# Patient Record
Sex: Male | Born: 1935 | Race: White | Hispanic: No | Marital: Married | Smoking: Current every day smoker
Health system: Southern US, Community
[De-identification: ages and names within clinical notes are randomized; demographics above are authoritative.]

## PROBLEM LIST (undated history)

## (undated) ENCOUNTER — Emergency Department (HOSPITAL_COMMUNITY): Admission: EM | Payer: Self-pay | Source: Home / Self Care

## (undated) DIAGNOSIS — I499 Cardiac arrhythmia, unspecified: Secondary | ICD-10-CM

## (undated) DIAGNOSIS — IMO0001 Reserved for inherently not codable concepts without codable children: Secondary | ICD-10-CM

## (undated) DIAGNOSIS — I701 Atherosclerosis of renal artery: Secondary | ICD-10-CM

## (undated) DIAGNOSIS — N183 Chronic kidney disease, stage 3 unspecified: Secondary | ICD-10-CM

## (undated) DIAGNOSIS — E7800 Pure hypercholesterolemia, unspecified: Secondary | ICD-10-CM

## (undated) DIAGNOSIS — I714 Abdominal aortic aneurysm, without rupture, unspecified: Secondary | ICD-10-CM

## (undated) DIAGNOSIS — I3139 Other pericardial effusion (noninflammatory): Secondary | ICD-10-CM

## (undated) DIAGNOSIS — R0600 Dyspnea, unspecified: Secondary | ICD-10-CM

## (undated) DIAGNOSIS — R7303 Prediabetes: Secondary | ICD-10-CM

## (undated) DIAGNOSIS — E119 Type 2 diabetes mellitus without complications: Secondary | ICD-10-CM

## (undated) DIAGNOSIS — I10 Essential (primary) hypertension: Secondary | ICD-10-CM

## (undated) DIAGNOSIS — Z5189 Encounter for other specified aftercare: Secondary | ICD-10-CM

## (undated) DIAGNOSIS — M5430 Sciatica, unspecified side: Secondary | ICD-10-CM

## (undated) DIAGNOSIS — I313 Pericardial effusion (noninflammatory): Secondary | ICD-10-CM

## (undated) HISTORY — DX: Pericardial effusion (noninflammatory): I31.3

## (undated) HISTORY — DX: Essential (primary) hypertension: I10

## (undated) HISTORY — DX: Other pericardial effusion (noninflammatory): I31.39

---

## 1979-04-20 HISTORY — PX: NASAL SEPTUM SURGERY: SHX37

## 1989-04-19 HISTORY — PX: CATARACT EXTRACTION W/ INTRAOCULAR LENS  IMPLANT, BILATERAL: SHX1307

## 1998-02-14 ENCOUNTER — Encounter (HOSPITAL_COMMUNITY): Admission: RE | Admit: 1998-02-14 | Discharge: 1998-05-15 | Payer: Self-pay | Admitting: Gastroenterology

## 1998-02-16 ENCOUNTER — Ambulatory Visit (HOSPITAL_COMMUNITY): Admission: RE | Admit: 1998-02-16 | Discharge: 1998-02-16 | Payer: Self-pay | Admitting: Gastroenterology

## 1998-02-23 ENCOUNTER — Ambulatory Visit (HOSPITAL_COMMUNITY): Admission: RE | Admit: 1998-02-23 | Discharge: 1998-02-23 | Payer: Self-pay | Admitting: Gastroenterology

## 2003-08-30 ENCOUNTER — Inpatient Hospital Stay (HOSPITAL_COMMUNITY): Admission: AD | Admit: 2003-08-30 | Discharge: 2003-09-01 | Payer: Self-pay | Admitting: Orthopedic Surgery

## 2004-05-07 ENCOUNTER — Encounter: Payer: Self-pay | Admitting: Internal Medicine

## 2005-02-11 ENCOUNTER — Encounter: Admission: RE | Admit: 2005-02-11 | Discharge: 2005-02-11 | Payer: Self-pay | Admitting: Orthopedic Surgery

## 2005-02-13 ENCOUNTER — Ambulatory Visit (HOSPITAL_COMMUNITY): Admission: RE | Admit: 2005-02-13 | Discharge: 2005-02-13 | Payer: Self-pay | Admitting: Orthopedic Surgery

## 2005-02-13 ENCOUNTER — Ambulatory Visit (HOSPITAL_BASED_OUTPATIENT_CLINIC_OR_DEPARTMENT_OTHER): Admission: RE | Admit: 2005-02-13 | Discharge: 2005-02-13 | Payer: Self-pay | Admitting: Orthopedic Surgery

## 2005-10-04 ENCOUNTER — Emergency Department (HOSPITAL_COMMUNITY): Admission: EM | Admit: 2005-10-04 | Discharge: 2005-10-04 | Payer: Self-pay | Admitting: Emergency Medicine

## 2011-08-20 DIAGNOSIS — T4145XA Adverse effect of unspecified anesthetic, initial encounter: Secondary | ICD-10-CM

## 2011-08-20 DIAGNOSIS — T8859XA Other complications of anesthesia, initial encounter: Secondary | ICD-10-CM

## 2011-08-20 HISTORY — DX: Adverse effect of unspecified anesthetic, initial encounter: T41.45XA

## 2011-08-20 HISTORY — DX: Other complications of anesthesia, initial encounter: T88.59XA

## 2011-08-26 DIAGNOSIS — E782 Mixed hyperlipidemia: Secondary | ICD-10-CM | POA: Diagnosis not present

## 2011-08-26 DIAGNOSIS — E1129 Type 2 diabetes mellitus with other diabetic kidney complication: Secondary | ICD-10-CM | POA: Diagnosis not present

## 2011-08-26 DIAGNOSIS — E119 Type 2 diabetes mellitus without complications: Secondary | ICD-10-CM | POA: Diagnosis not present

## 2011-08-26 DIAGNOSIS — I10 Essential (primary) hypertension: Secondary | ICD-10-CM | POA: Diagnosis not present

## 2011-08-26 DIAGNOSIS — N183 Chronic kidney disease, stage 3 unspecified: Secondary | ICD-10-CM | POA: Diagnosis not present

## 2011-09-17 DIAGNOSIS — I10 Essential (primary) hypertension: Secondary | ICD-10-CM | POA: Diagnosis not present

## 2011-09-17 DIAGNOSIS — R609 Edema, unspecified: Secondary | ICD-10-CM | POA: Diagnosis not present

## 2011-09-23 DIAGNOSIS — N3941 Urge incontinence: Secondary | ICD-10-CM | POA: Diagnosis not present

## 2011-09-23 DIAGNOSIS — N401 Enlarged prostate with lower urinary tract symptoms: Secondary | ICD-10-CM | POA: Diagnosis not present

## 2011-09-30 ENCOUNTER — Other Ambulatory Visit: Payer: Self-pay | Admitting: Nephrology

## 2011-09-30 DIAGNOSIS — E785 Hyperlipidemia, unspecified: Secondary | ICD-10-CM | POA: Diagnosis not present

## 2011-09-30 DIAGNOSIS — I129 Hypertensive chronic kidney disease with stage 1 through stage 4 chronic kidney disease, or unspecified chronic kidney disease: Secondary | ICD-10-CM | POA: Diagnosis not present

## 2011-09-30 DIAGNOSIS — N189 Chronic kidney disease, unspecified: Secondary | ICD-10-CM

## 2011-09-30 DIAGNOSIS — E213 Hyperparathyroidism, unspecified: Secondary | ICD-10-CM | POA: Diagnosis not present

## 2011-09-30 DIAGNOSIS — D649 Anemia, unspecified: Secondary | ICD-10-CM | POA: Diagnosis not present

## 2011-09-30 DIAGNOSIS — N183 Chronic kidney disease, stage 3 unspecified: Secondary | ICD-10-CM | POA: Diagnosis not present

## 2011-10-03 ENCOUNTER — Ambulatory Visit
Admission: RE | Admit: 2011-10-03 | Discharge: 2011-10-03 | Disposition: A | Discharge status: Home / Self Care | Payer: Medicare Other | Source: Ambulatory Visit | Attending: Nephrology | Admitting: Nephrology

## 2011-10-03 DIAGNOSIS — N189 Chronic kidney disease, unspecified: Secondary | ICD-10-CM

## 2011-10-03 DIAGNOSIS — I319 Disease of pericardium, unspecified: Secondary | ICD-10-CM | POA: Diagnosis not present

## 2011-10-08 DIAGNOSIS — I120 Hypertensive chronic kidney disease with stage 5 chronic kidney disease or end stage renal disease: Secondary | ICD-10-CM | POA: Diagnosis not present

## 2011-10-14 DIAGNOSIS — I120 Hypertensive chronic kidney disease with stage 5 chronic kidney disease or end stage renal disease: Secondary | ICD-10-CM | POA: Diagnosis not present

## 2011-11-08 ENCOUNTER — Other Ambulatory Visit: Payer: Self-pay | Admitting: Nephrology

## 2011-11-08 ENCOUNTER — Encounter: Payer: Self-pay | Admitting: Cardiovascular Disease

## 2011-11-08 DIAGNOSIS — N184 Chronic kidney disease, stage 4 (severe): Secondary | ICD-10-CM | POA: Diagnosis not present

## 2011-11-08 DIAGNOSIS — I10 Essential (primary) hypertension: Secondary | ICD-10-CM | POA: Diagnosis not present

## 2011-11-08 DIAGNOSIS — I129 Hypertensive chronic kidney disease with stage 1 through stage 4 chronic kidney disease, or unspecified chronic kidney disease: Secondary | ICD-10-CM | POA: Diagnosis not present

## 2011-11-08 DIAGNOSIS — E785 Hyperlipidemia, unspecified: Secondary | ICD-10-CM | POA: Diagnosis not present

## 2011-11-08 DIAGNOSIS — I701 Atherosclerosis of renal artery: Secondary | ICD-10-CM

## 2011-11-11 DIAGNOSIS — H6120 Impacted cerumen, unspecified ear: Secondary | ICD-10-CM | POA: Diagnosis not present

## 2011-11-12 ENCOUNTER — Ambulatory Visit (HOSPITAL_COMMUNITY)
Admission: RE | Admit: 2011-11-12 | Discharge: 2011-11-12 | Disposition: A | Discharge status: Home / Self Care | Payer: Medicare Other | Source: Ambulatory Visit | Attending: Nephrology | Admitting: Nephrology

## 2011-11-12 DIAGNOSIS — I700 Atherosclerosis of aorta: Secondary | ICD-10-CM | POA: Diagnosis not present

## 2011-11-12 DIAGNOSIS — I714 Abdominal aortic aneurysm, without rupture, unspecified: Secondary | ICD-10-CM | POA: Insufficient documentation

## 2011-11-12 DIAGNOSIS — I701 Atherosclerosis of renal artery: Secondary | ICD-10-CM | POA: Diagnosis not present

## 2011-11-12 DIAGNOSIS — N19 Unspecified kidney failure: Secondary | ICD-10-CM | POA: Insufficient documentation

## 2011-11-12 DIAGNOSIS — I129 Hypertensive chronic kidney disease with stage 1 through stage 4 chronic kidney disease, or unspecified chronic kidney disease: Secondary | ICD-10-CM | POA: Diagnosis not present

## 2011-11-14 ENCOUNTER — Encounter: Payer: Self-pay | Admitting: *Deleted

## 2011-11-14 ENCOUNTER — Ambulatory Visit (INDEPENDENT_AMBULATORY_CARE_PROVIDER_SITE_OTHER): Payer: Medicare Other | Admitting: Cardiovascular Disease

## 2011-11-14 VITALS — BP 147/80 | HR 63 | Ht 69.0 in | Wt 141.1 lb

## 2011-11-14 DIAGNOSIS — I10 Essential (primary) hypertension: Secondary | ICD-10-CM | POA: Diagnosis not present

## 2011-11-14 DIAGNOSIS — I701 Atherosclerosis of renal artery: Secondary | ICD-10-CM | POA: Diagnosis not present

## 2011-11-14 MED ORDER — CLOPIDOGREL BISULFATE 75 MG PO TABS: ORAL_TABLET | ORAL | Status: DC

## 2011-11-14 NOTE — Patient Instructions (Signed)
Your physician has requested that you have a peripheral vascular angiogram. This exam is performed at the hospital. During this exam IV contrast is used to look at arterial blood flow. Please review the information sheet given for details.   

## 2011-11-15 ENCOUNTER — Encounter (HOSPITAL_COMMUNITY): Payer: Self-pay | Admitting: Pharmacy Technician

## 2011-11-23 ENCOUNTER — Encounter: Payer: Self-pay | Admitting: Cardiovascular Disease

## 2011-11-23 NOTE — Progress Notes (Signed)
 HPI:  [name] referred for evaluation of renal artery stenosis. The patient has been recently followed by Dr. Deterding with the [facility]. He has long-standing hypertension and has had type 2 diabetes diagnosed approximately 5 years ago. 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 His most recent creatinine was 1.8 mg/dL. Because of progressive renal dysfunction he underwent an MRA to evaluate for renovascular disease. This suggested bilateral renal artery stenosis with either significant stenosis or a flow gap created by calcification at the origin of the right renal artery. On the left side there was very poor in flow noted into the main renal artery suggestive of a critical flow-limiting stenosis. 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 The patient also underwent renal duplex ultrasonography showing equal kidney size bilaterally at 9.6 cm in length on the right and 9.7 cm in length on the left.  Renal arterial velocities were markedly elevated bilaterally left greater than right with a peak velocity of 577 cm/s on the left and 351 cm/s on the right. These findings are also highly consistent with severe bilateral renal artery stenosis. He is referred for consideration of catheter angiography.  From a symptomatic perspective, the patient is doing fairly well. He walks 30 minutes per day and denies exertional chest pain or pressure. He denies dyspnea, leg claudication symptoms, orthopnea, PND, or edema.  He continues to smoke a pipe. He drinks 2-3 ounces of bourbon per day. 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 He has a very supportive family with his wife and daughters present today. He has a strong family history of cardiovascular disease. His brother had a myocardial infarction and his  daughter has had a DVT.  Outpatient Encounter Prescriptions as of 11/14/2011  Medication Sig Dispense Refill  . amLODipine (NORVASC) 10 MG tablet Take 10 mg by mouth daily.      . carvedilol (COREG) 12.5 MG tablet Take 12.5 mg by mouth 2 (two) times daily with a meal.      . Cholecalciferol (VITAMIN D3) 2000 UNITS TABS Take 2,000 mg by mouth daily.       . ezetimibe (ZETIA) 10 MG tablet Take 10 mg by mouth daily.      . fenofibrate (TRICOR) 145 MG tablet Take 145 mg by mouth daily.      . furosemide (LASIX) 20 MG tablet Take 20 mg by mouth daily. Will stop 4-8 for procedure       . rosuvastatin (CRESTOR) 40 MG tablet Take 40 mg by mouth daily.      . TAMSULOSIN HCL PO Take 0.5 mg by mouth daily.      . clopidogrel (PLAVIX) 75 MG tablet Take 4 tablets the day before your renal angiogram.  4 tablet  0    Review of patient's allergies indicates no known allergies.  Past Medical History  Diagnosis Date  . Kidney disease     stage 3/4juxtar  . HTN (hypertension)   . Diabetes mellitus     Past Surgical History  Procedure Date  . Nose surgery   . Cataract extraction     History   Social History  . Marital Status: Married    Spouse Name: N/A    Number of Children: N/A  . Years of Education: N/A   Occupational History  .   Not on file.   Social History Main Topics  . Smoking status: Current Everyday Smoker    Types: Pipe  . Smokeless tobacco: Not on file  . Alcohol Use: Yes     Daily  . Drug Use: No  . Sexually Active:    Other Topics Concern  . Not on file   Social History Narrative  . No narrative on file    Family History  Problem Relation Age of Onset  . Hypertension Mother   . Hypertension Brother     ROS: General: no fevers/chills/night sweats Eyes: no blurry vision, diplopia, or amaurosis ENT: no sore throat or hearing loss Resp: no cough, wheezing, or hemoptysis CV: no edema or palpitations GI: no abdominal pain, nausea, vomiting, diarrhea, or  constipation GU: no dysuria, frequency, or hematuria Skin: no rash Neuro: no headache, numbness, tingling, or weakness of extremities Musculoskeletal: no joint pain or swelling Heme: no bleeding, DVT, or easy bruising Endo: no polydipsia or polyuria  BP 147/80  Pulse 63  Ht 5' 9" (1.753 m)  Wt 64.012 kg (141 lb 1.9 oz)  BMI 20.84 kg/m2  PHYSICAL EXAM: Pt is alert and oriented, WD, WN, pleasant elderly male in no distress. HEENT: normal Neck: JVP normal. Carotid upstrokes normal without bruits. No thyromegaly. Lungs: equal expansion, clear bilaterally CV: Apex is discrete and nondisplaced, RRR without murmur or gallop Abd: soft, NT, +BS, no abdominal bruit, no hepatosplenomegaly Back: no CVA tenderness Ext: no C/C/E        Femoral pulses 2+= without bruits        DP/PT pulses decreased bilaterally Skin: warm and dry without rash Neuro: CNII-XII intact             Strength intact = bilaterally  EKG:  Sinus rhythm 59 beats per minute, right bundle branch block, otherwise within normal limits.  ASSESSMENT AND PLAN:  

## 2011-11-23 NOTE — Assessment & Plan Note (Addendum)
The patient has a clinical history and confirmatory noninvasive studies suggestive of severe bilateral renal artery stenosis. He is at risk of progressive deterioration of his renal function. I agree that catheter angiography with an eye toward PTA and stenting is indicated. I reviewed the risks, indications, and alternatives of this procedure with the patient and his family in detail. Specific risks include bleeding, renal artery injury or perforation, and renal failure. They understand the concept that while this procedure is indicated in order to preserve renal function, some patients worsen because of contrast nephropathy or atheroemboli. Will take all precautions with use of CO2 angiography, minimization of ionated contrast, and adequate preprocedure hydration. I last the patient to start aspirin and Plavix prior to the procedure, push fluids the day prior to the procedure, all furosemide for 48 hours prior to the procedure, and to come in early the day of the procedure for IV hydration. Questions were answered to his satisfaction. Further plans pending the result of his angiogram.

## 2011-11-27 ENCOUNTER — Encounter (HOSPITAL_COMMUNITY)
Admission: RE | Disposition: A | Discharge status: Home / Self Care | Payer: Self-pay | Source: Ambulatory Visit | Attending: Cardiovascular Disease

## 2011-11-27 ENCOUNTER — Encounter: Payer: Self-pay | Admitting: Internal Medicine

## 2011-11-27 ENCOUNTER — Encounter (HOSPITAL_COMMUNITY): Payer: Self-pay | Admitting: General Practice

## 2011-11-27 ENCOUNTER — Ambulatory Visit (HOSPITAL_COMMUNITY)
Admission: RE | Admit: 2011-11-27 | Discharge: 2011-11-28 | Disposition: A | Discharge status: Home / Self Care | Payer: MEDICARE | Source: Ambulatory Visit | Attending: Cardiovascular Disease | Admitting: Cardiovascular Disease

## 2011-11-27 DIAGNOSIS — E119 Type 2 diabetes mellitus without complications: Secondary | ICD-10-CM | POA: Insufficient documentation

## 2011-11-27 DIAGNOSIS — I701 Atherosclerosis of renal artery: Secondary | ICD-10-CM

## 2011-11-27 DIAGNOSIS — Z720 Tobacco use: Secondary | ICD-10-CM | POA: Diagnosis present

## 2011-11-27 DIAGNOSIS — I10 Essential (primary) hypertension: Secondary | ICD-10-CM | POA: Diagnosis present

## 2011-11-27 DIAGNOSIS — N184 Chronic kidney disease, stage 4 (severe): Secondary | ICD-10-CM | POA: Diagnosis not present

## 2011-11-27 DIAGNOSIS — I714 Abdominal aortic aneurysm, without rupture, unspecified: Secondary | ICD-10-CM | POA: Diagnosis not present

## 2011-11-27 DIAGNOSIS — E876 Hypokalemia: Secondary | ICD-10-CM | POA: Diagnosis not present

## 2011-11-27 DIAGNOSIS — I129 Hypertensive chronic kidney disease with stage 1 through stage 4 chronic kidney disease, or unspecified chronic kidney disease: Secondary | ICD-10-CM | POA: Insufficient documentation

## 2011-11-27 DIAGNOSIS — N189 Chronic kidney disease, unspecified: Secondary | ICD-10-CM | POA: Diagnosis present

## 2011-11-27 HISTORY — DX: Atherosclerosis of renal artery: I70.1

## 2011-11-27 HISTORY — DX: Reserved for inherently not codable concepts without codable children: IMO0001

## 2011-11-27 HISTORY — PX: RENAL ANGIOGRAM: SHX5509

## 2011-11-27 HISTORY — DX: Chronic kidney disease, stage 3 (moderate): N18.3

## 2011-11-27 HISTORY — PX: RENAL ARTERY STENT: SHX2321

## 2011-11-27 HISTORY — DX: Type 2 diabetes mellitus without complications: E11.9

## 2011-11-27 HISTORY — DX: Pure hypercholesterolemia, unspecified: E78.00

## 2011-11-27 HISTORY — DX: Encounter for other specified aftercare: Z51.89

## 2011-11-27 HISTORY — DX: Abdominal aortic aneurysm, without rupture, unspecified: I71.40

## 2011-11-27 HISTORY — DX: Cardiac arrhythmia, unspecified: I49.9

## 2011-11-27 HISTORY — PX: ABDOMINAL ANGIOGRAM: SHX5499

## 2011-11-27 HISTORY — DX: Abdominal aortic aneurysm, without rupture: I71.4

## 2011-11-27 HISTORY — DX: Chronic kidney disease, stage 3 unspecified: N18.30

## 2011-11-27 LAB — BASIC METABOLIC PANEL
BUN: 31 mg/dL — ABNORMAL HIGH (ref 6–23)
Chloride: 104 mEq/L (ref 96–112)
Creatinine, Ser: 1.37 mg/dL — ABNORMAL HIGH (ref 0.50–1.35)
GFR calc Af Amer: 55 mL/min — ABNORMAL LOW (ref >90)
GFR calc non Af Amer: 47 mL/min — ABNORMAL LOW (ref >90)
Glucose, Bld: 111 mg/dL — ABNORMAL HIGH (ref 70–99)
Potassium: 3.5 mEq/L (ref 3.5–5.1)

## 2011-11-27 LAB — PROTIME-INR
INR: 1.42 (ref 0.00–1.49)
Prothrombin Time: 17.6 seconds — ABNORMAL HIGH (ref 11.6–15.2)

## 2011-11-27 LAB — CBC
HCT: 34.9 % — ABNORMAL LOW (ref 39.0–52.0)
MCH: 28 pg (ref 26.0–34.0)
Platelets: 123 10*3/uL — ABNORMAL LOW (ref 150–400)
RBC: 4.11 MIL/uL — ABNORMAL LOW (ref 4.22–5.81)
RDW: 15 % (ref 11.5–15.5)
WBC: 11.1 10*3/uL — ABNORMAL HIGH (ref 4.0–10.5)

## 2011-11-27 LAB — GLUCOSE, CAPILLARY: Glucose-Capillary: 106 mg/dL — ABNORMAL HIGH (ref 70–99)

## 2011-11-27 SURGERY — RENAL ANGIOGRAM
Anesthesia: LOCAL

## 2011-11-27 MED ORDER — ONDANSETRON HCL 4 MG/2ML IJ SOLN: 4.0000 mg | Freq: Four times a day (QID) | INTRAMUSCULAR | Status: DC | PRN

## 2011-11-27 MED ORDER — CLOPIDOGREL BISULFATE 75 MG PO TABS
75.0000 mg | ORAL_TABLET | Freq: Every day | ORAL | Status: DC
  Administered 2011-11-28: 75 mg via ORAL
  Filled 2011-11-27: qty 1

## 2011-11-27 MED ORDER — HEPARIN (PORCINE) IN NACL 2-0.9 UNIT/ML-% IJ SOLN
INTRAMUSCULAR | Status: AC
  Filled 2011-11-27: qty 1000

## 2011-11-27 MED ORDER — FUROSEMIDE 20 MG PO TABS
20.0000 mg | ORAL_TABLET | Freq: Every day | ORAL | Status: DC
  Administered 2011-11-27 – 2011-11-28 (×2): 20 mg via ORAL
  Filled 2011-11-27 (×2): qty 1

## 2011-11-27 MED ORDER — LIDOCAINE HCL (PF) 1 % IJ SOLN
INTRAMUSCULAR | Status: AC
  Filled 2011-11-27: qty 30

## 2011-11-27 MED ORDER — ACETAMINOPHEN 325 MG PO TABS: 650.0000 mg | ORAL_TABLET | ORAL | Status: DC | PRN

## 2011-11-27 MED ORDER — ASPIRIN 81 MG PO CHEW
324.0000 mg | CHEWABLE_TABLET | ORAL | Status: AC
  Administered 2011-11-27: 324 mg via ORAL
  Filled 2011-11-27: qty 4

## 2011-11-27 MED ORDER — CLOPIDOGREL BISULFATE 75 MG PO TABS
ORAL_TABLET | ORAL | Status: AC
  Administered 2011-11-27: 75 mg via ORAL
  Filled 2011-11-27: qty 1

## 2011-11-27 MED ORDER — SODIUM CHLORIDE 0.9 % IJ SOLN: 3.0000 mL | Freq: Two times a day (BID) | INTRAMUSCULAR | Status: DC

## 2011-11-27 MED ORDER — MIDAZOLAM HCL 2 MG/2ML IJ SOLN
INTRAMUSCULAR | Status: AC
  Filled 2011-11-27: qty 2

## 2011-11-27 MED ORDER — DIAZEPAM 5 MG PO TABS
5.0000 mg | ORAL_TABLET | ORAL | Status: AC
  Administered 2011-11-27: 5 mg via ORAL
  Filled 2011-11-27: qty 1

## 2011-11-27 MED ORDER — DIAZEPAM 5 MG PO TABS
5.0000 mg | ORAL_TABLET | Freq: Once | ORAL | Status: AC
  Administered 2011-11-27: 5 mg via ORAL
  Filled 2011-11-27: qty 1

## 2011-11-27 MED ORDER — SODIUM CHLORIDE 0.9 % IV SOLN: 250.0000 mL | INTRAVENOUS | Status: DC | PRN

## 2011-11-27 MED ORDER — FENTANYL CITRATE 0.05 MG/ML IJ SOLN
INTRAMUSCULAR | Status: AC
  Filled 2011-11-27: qty 2

## 2011-11-27 MED ORDER — TAMSULOSIN HCL 0.4 MG PO CAPS
0.4000 mg | ORAL_CAPSULE | Freq: Every day | ORAL | Status: DC
  Filled 2011-11-27 (×2): qty 1

## 2011-11-27 MED ORDER — HEPARIN SODIUM (PORCINE) 1000 UNIT/ML IJ SOLN
INTRAMUSCULAR | Status: AC
  Filled 2011-11-27: qty 1

## 2011-11-27 MED ORDER — ATORVASTATIN CALCIUM 40 MG PO TABS
40.0000 mg | ORAL_TABLET | Freq: Every day | ORAL | Status: DC
  Filled 2011-11-27 (×2): qty 1

## 2011-11-27 MED ORDER — SODIUM CHLORIDE 0.9 % IJ SOLN
3.0000 mL | Freq: Two times a day (BID) | INTRAMUSCULAR | Status: DC
  Administered 2011-11-28: 3 mL via INTRAVENOUS

## 2011-11-27 MED ORDER — SODIUM CHLORIDE 0.9 % IJ SOLN: 3.0000 mL | INTRAMUSCULAR | Status: DC | PRN

## 2011-11-27 MED ORDER — LOPERAMIDE HCL 2 MG PO CAPS
4.0000 mg | ORAL_CAPSULE | Freq: Once | ORAL | Status: AC
  Administered 2011-11-27: 4 mg via ORAL
  Filled 2011-11-27: qty 2

## 2011-11-27 MED ORDER — SODIUM CHLORIDE 0.9 % IV SOLN
1.0000 mL/kg/h | INTRAVENOUS | Status: AC
  Administered 2011-11-27: 1 mL/kg/h via INTRAVENOUS

## 2011-11-27 MED ORDER — CLOPIDOGREL BISULFATE 75 MG PO TABS
75.0000 mg | ORAL_TABLET | ORAL | Status: AC
  Administered 2011-11-27: 75 mg via ORAL

## 2011-11-27 MED ORDER — SODIUM CHLORIDE 0.9 % IV SOLN
INTRAVENOUS | Status: DC
  Administered 2011-11-27: 09:00:00 via INTRAVENOUS

## 2011-11-27 MED ORDER — CARVEDILOL 12.5 MG PO TABS
12.5000 mg | ORAL_TABLET | Freq: Two times a day (BID) | ORAL | Status: DC
  Administered 2011-11-27 – 2011-11-28 (×2): 12.5 mg via ORAL
  Filled 2011-11-27 (×4): qty 1

## 2011-11-27 MED ORDER — ASPIRIN EC 81 MG PO TBEC
81.0000 mg | DELAYED_RELEASE_TABLET | Freq: Every day | ORAL | Status: DC
  Administered 2011-11-28: 81 mg via ORAL
  Filled 2011-11-27: qty 1

## 2011-11-27 MED ORDER — SODIUM CHLORIDE 0.9 % IV SOLN: 250.0000 mL | INTRAVENOUS | Status: DC

## 2011-11-27 MED ORDER — LOPERAMIDE HCL 2 MG PO CAPS: 2.0000 mg | ORAL_CAPSULE | ORAL | Status: DC | PRN

## 2011-11-27 MED ORDER — AMLODIPINE BESYLATE 10 MG PO TABS
10.0000 mg | ORAL_TABLET | Freq: Every day | ORAL | Status: DC
  Administered 2011-11-28: 10 mg via ORAL
  Filled 2011-11-27: qty 1

## 2011-11-27 MED ORDER — FENOFIBRATE 54 MG PO TABS
54.0000 mg | ORAL_TABLET | Freq: Every day | ORAL | Status: DC
  Administered 2011-11-28: 54 mg via ORAL
  Filled 2011-11-27 (×2): qty 1

## 2011-11-27 MED ORDER — EZETIMIBE 10 MG PO TABS
10.0000 mg | ORAL_TABLET | Freq: Every day | ORAL | Status: DC
  Administered 2011-11-28: 10 mg via ORAL
  Filled 2011-11-27 (×2): qty 1

## 2011-11-27 NOTE — Interval H&P Note (Signed)
History and Physical Interval Note:  11/27/2011 12:52 PM  Chris Peterson  has presented today for surgery, with the diagnosis of Renal artery stenosis  The various methods of treatment have been discussed with the patient and family. After consideration of risks, benefits and other options for treatment, the patient has consented to  Procedure(s) (LRB): RENAL ANGIOGRAM (Bilateral) ABDOMINAL ANGIOGRAM (N/A) as a surgical intervention .  The patients' history has been reviewed, patient examined, no change in status, stable for surgery.  I have reviewed the patients' chart and labs.  Questions were answered to the patient's satisfaction.     Tonny Bollman  11/27/2011 12:53 PM

## 2011-11-27 NOTE — CV Procedure (Signed)
   Cardiac Catheterization Procedure Note  Name: Chris Peterson MRN: [id number] DOB: 06/26/1930  Procedure: CO2 abdominal aortogram, bilateral selective renal angiography, left renal PTA and stenting  Indication: Severe bilateral renal artery stenosis with refractory hypertension and progressive renal disease   Procedural details: The right groin was prepped, draped, and anesthetized with 1% lidocaine. Using modified Seldinger technique, a 6 French sheath was introduced into the right femoral artery. A pigtail catheter was advanced into the suprarenal abdominal aorta. CO2 angiography was performed there was very poor visualization of the renal arteries. I was able to see the right renal artery as opacified right kidney, but the left renal artery was not well visualized. I proceeded with selective angiography of the left renal artery. A 6 French renal double curve guide was utilized. A selective CO2 renal angiogram was performed of the left renal artery, but the vessel again was poorly visualized. I used dilute Visipaque to selectively visualize the left renal artery. There is very heavy ostial calcification and severe stenosis. At that point the patient was given 5000 units of unfractionated heparin. A therapeutic ACT was achieved. The vessel was wired with a stabilizer wire. The vessel was predilated with a 5 x 15 mm balloon and the balloon expanded well at 10 atmospheres. The vessel was then stented with a 6 x 12 mm balloon expandable stent which was deployed at 12 atmospheres with excellent stent expansion. There was a marked pressure gradient across the renal origin which was completely abolished after stenting with no residual gradient. There was an excellent angiographic result with 0% residual stenosis and good flow throughout the left kidney. The guide catheter was pulled out of the left renal artery and used to selectively engage the right renal artery. Selective right renal angiography was then  performed. A right femoral angiogram was then done and a Perclose device was used for femoral arterial hemostasis. The patient tolerated the entire procedure well. There were no immediate complications. A total of 30 cc of contrast was used.  Procedural Findings: Diffuse abdominal aortic irregularity with an infrarenal abdominal aortic aneurysm. Next  The right renal artery is heavily calcified it origin. There is 70% proximal right renal artery stenosis.  The left renal artery is severely calcified at its origin. There is 90% proximal stenosis involving the origin of the vessel.   Final Conclusions:   1. Severe bilateral renal artery stenosis 2. Successful PTA and stenting of the left renal artery  Recommendations: The patient will continue with dual antiplatelet therapy for a minimum of 30 days. He will be hydrated for the remainder of the day and we'll plan on discharge tomorrow. He should have a metabolic panel checked next week. Will need to consider whether to proceed with right renal artery stenting in the future and this will depend on his overall clinical picture. The right renal artery clearly has hemodynamically significant stenosis, but the degree of disease is much less than that of the left renal artery.  Chris Peterson 11/27/2011, 2:56 PM

## 2011-11-27 NOTE — H&P (View-Only) (Signed)
HPI:  This is a 78 year old gentleman referred for evaluation of renal artery stenosis. The patient has been recently followed by Dr. Darrick Penna with the [facility]. He has long-standing hypertension and has had type 2 diabetes diagnosed approximately 5 years ago. 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 His most recent creatinine was 1.8 mg/dL. Because of progressive renal dysfunction he underwent an MRA to evaluate for renovascular disease. This suggested bilateral renal artery stenosis with either significant stenosis or a flow gap created by calcification at the origin of the right renal artery. On the left side there was very poor in flow noted into the main renal artery suggestive of a critical flow-limiting stenosis. 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 The patient also underwent renal duplex ultrasonography showing equal kidney size bilaterally at 9.6 cm in length on the right and 9.7 cm in length on the left.  Renal arterial velocities were markedly elevated bilaterally left greater than right with a peak velocity of 577 cm/s on the left and 351 cm/s on the right. These findings are also highly consistent with severe bilateral renal artery stenosis. He is referred for consideration of catheter angiography.  From a symptomatic perspective, the patient is doing fairly well. He walks 30 minutes per day and denies exertional chest pain or pressure. He denies dyspnea, leg claudication symptoms, orthopnea, PND, or edema.  He continues to smoke a pipe. He drinks 2-3 ounces of bourbon per day. 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 He has a very supportive family with his wife and daughters present today. He has a strong family history of cardiovascular disease. His brother had a myocardial infarction and his  daughter has had a DVT.  Outpatient Encounter Prescriptions as of 11/14/2011  Medication Sig Dispense Refill  . amLODipine (NORVASC) 10 MG tablet Take 10 mg by mouth daily.      . carvedilol (COREG) 12.5 MG tablet Take 12.5 mg by mouth 2 (two) times daily with a meal.      . Cholecalciferol (VITAMIN D3) 2000 UNITS TABS Take 2,000 mg by mouth daily.       [name] ezetimibe (ZETIA) 10 MG tablet Take 10 mg by mouth daily.      . fenofibrate (TRICOR) 145 MG tablet Take 145 mg by mouth daily.      . furosemide (LASIX) 20 MG tablet Take 20 mg by mouth daily. Will stop 4-8 for procedure       . rosuvastatin (CRESTOR) 40 MG tablet Take 40 mg by mouth daily.      [name] TAMSULOSIN HCL PO Take 0.5 mg by mouth daily.      . clopidogrel (PLAVIX) 75 MG tablet Take 4 tablets the day before your renal angiogram.  4 tablet  0    Review of patient's allergies indicates no known allergies.  Past Medical History  Diagnosis Date  . Kidney disease     stage 3/4juxtar  . HTN (hypertension)   . Diabetes mellitus     Past Surgical History  Procedure Date  . Nose surgery   . Cataract extraction     History   Social History  . Marital Status: Married    Spouse Name: N/A    Number of Children: N/A  . Years of Education: N/A   Occupational History  .  Not on file.   Social History Main Topics  . Smoking status: Current Everyday Smoker    Types: Pipe  . Smokeless tobacco: Not on file  . Alcohol Use: Yes     Daily  . Drug Use: No  . Sexually Active:    Other Topics Concern  . Not on file   Social History Narrative  . No narrative on file    Family History  Problem Relation Age of Onset  . Hypertension Mother   . Hypertension Brother     ROS: General: no fevers/chills/night sweats Eyes: no blurry vision, diplopia, or amaurosis ENT: no sore throat or hearing loss Resp: no cough, wheezing, or hemoptysis CV: no edema or palpitations GI: no abdominal pain, nausea, vomiting, diarrhea, or  constipation GU: no dysuria, frequency, or hematuria Skin: no rash Neuro: no headache, numbness, tingling, or weakness of extremities Musculoskeletal: no joint pain or swelling Heme: no bleeding, DVT, or easy bruising Endo: no polydipsia or polyuria  BP 147/80  Pulse 63  Ht 5\' 9"  (1.753 m)  Wt 64.012 kg (141 lb 1.9 oz)  BMI 20.84 kg/m2  PHYSICAL EXAM: Pt is alert and oriented, WD, WN, pleasant elderly male in no distress. HEENT: normal Neck: JVP normal. Carotid upstrokes normal without bruits. No thyromegaly. Lungs: equal expansion, clear bilaterally CV: Apex is discrete and nondisplaced, RRR without murmur or gallop Abd: soft, NT, +BS, no abdominal bruit, no hepatosplenomegaly Back: no CVA tenderness Ext: no C/C/E        Femoral pulses 2+= without bruits        DP/PT pulses decreased bilaterally Skin: warm and dry without rash Neuro: CNII-XII intact             Strength intact = bilaterally  EKG:  Sinus rhythm 59 beats per minute, right bundle branch block, otherwise within normal limits.  ASSESSMENT AND PLAN:

## 2011-11-28 ENCOUNTER — Encounter (HOSPITAL_COMMUNITY): Payer: Self-pay | Admitting: Cardiology

## 2011-11-28 DIAGNOSIS — I10 Essential (primary) hypertension: Secondary | ICD-10-CM | POA: Diagnosis present

## 2011-11-28 DIAGNOSIS — N189 Chronic kidney disease, unspecified: Secondary | ICD-10-CM | POA: Diagnosis present

## 2011-11-28 DIAGNOSIS — I701 Atherosclerosis of renal artery: Secondary | ICD-10-CM | POA: Diagnosis not present

## 2011-11-28 DIAGNOSIS — Z720 Tobacco use: Secondary | ICD-10-CM | POA: Diagnosis present

## 2011-11-28 DIAGNOSIS — E119 Type 2 diabetes mellitus without complications: Secondary | ICD-10-CM | POA: Diagnosis present

## 2011-11-28 DIAGNOSIS — I714 Abdominal aortic aneurysm, without rupture: Secondary | ICD-10-CM | POA: Diagnosis present

## 2011-11-28 LAB — BASIC METABOLIC PANEL
BUN: 21 mg/dL (ref 6–23)
Creatinine, Ser: 1.12 mg/dL (ref 0.50–1.35)
GFR calc Af Amer: 70 mL/min — ABNORMAL LOW (ref >90)
GFR calc non Af Amer: 61 mL/min — ABNORMAL LOW (ref >90)
Glucose, Bld: 237 mg/dL — ABNORMAL HIGH (ref 70–99)

## 2011-11-28 MED ORDER — POTASSIUM CHLORIDE CRYS ER 20 MEQ PO TBCR
40.0000 meq | EXTENDED_RELEASE_TABLET | Freq: Once | ORAL | Status: AC
  Administered 2011-11-28: 40 meq via ORAL
  Filled 2011-11-28: qty 2

## 2011-11-28 MED ORDER — CLOPIDOGREL BISULFATE 75 MG PO TABS: 75.0000 mg | ORAL_TABLET | Freq: Every day | ORAL | Status: DC

## 2011-11-28 MED ORDER — POTASSIUM CHLORIDE CRYS ER 10 MEQ PO TBCR: 10.0000 meq | EXTENDED_RELEASE_TABLET | Freq: Every day | ORAL | Status: DC

## 2011-11-28 NOTE — Discharge Summary (Signed)
Agree as above. See progress note this same date.  Chris Peterson 11/28/2011 9:55 PM

## 2011-11-28 NOTE — Discharge Summary (Signed)
Discharge Summary   Patient ID: Chris Peterson MRN: [id number], DOB/AGE: [birth year] 76 y.o.  Primary MD: Dr. Donovan Kail w/ [facility] Primary Cardiologist: Dr. Excell Seltzer  Admit date: 11/27/2011 D/C date:     11/28/2011      Primary Discharge Diagnoses:  1. Renal Artery Stenosis, Bilateral  - Bilateral renal angiography 11/27/11, Right 70% prox stensosis, Left 90% prox stenosis s/p Left Renal PTA/Stenting with considerations for right renal artery stenting in the future  - DAPT w/ ASA & Plavix for a minimum of 30 days  - F/u renal arterial doppler prior to next office visit  2. Hypokalemia  - K+ 3.0 on day of discharge, supplemented and dc'd with daily.  - BMET next week  Secondary Discharge Diagnoses:  1. Hypertension 2. CKD - Stage 3-4 3. Infrarenal AAA - maximal diameter of 3.5 cm  4. Hyperlipidemia 5. Diabetes Mellitus, Type 2 6. Tobacco abuse 7. Hemorrhoids 8. Nose surgery 9. Cataract extraction  Allergies No Known Allergies  Diagnostic Studies/Procedures:   11/27/11 - CO2 abdominal aortogram, bilateral selective renal angiography, left renal PTA and stenting Procedural Findings:  Diffuse abdominal aortic irregularity with an infrarenal abdominal aortic aneurysm.  The right renal artery is heavily calcified it origin. There is 70% proximal right renal artery stenosis.  The left renal artery is severely calcified at its origin. There is 90% proximal stenosis involving the origin of the vessel.  Final Conclusions:  1. Severe bilateral renal artery stenosis  2. Successful PTA and stenting of the left renal artery  Recommendations: The patient will continue with dual antiplatelet therapy for a minimum of 30 days. He will be hydrated for the remainder of the day and we'll plan on discharge tomorrow. He should have a metabolic panel checked next week. Will need to consider whether to proceed with right renal artery stenting in the future and this will depend on his overall  clinical picture. The right renal artery clearly has hemodynamically significant stenosis, but the degree of disease is much less than that of the left renal artery.  History of Present Illness: 78 y.o. male w/ above medical problems who presented to [facility] on 11/27/11 for planned renal angiography due to severe bilateral renal artery stenosis with refractory hypertension and progressive renal disease.  The patient has been recently followed by Dr. Darrick Penna with the [facility]. He has long-standing hypertension and has had type 2 diabetes diagnosed approximately 5 years ago. 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 His most recent creatinine was 1.8 mg/dL. Because of progressive renal dysfunction he underwent an MRA to evaluate for renovascular disease. This suggested bilateral renal artery stenosis with either significant stenosis or a flow gap created by calcification at the origin of the right renal artery. On the left side there was very poor in flow noted into the main renal artery suggestive of a critical flow-limiting stenosis. 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 The patient also underwent renal duplex ultrasonography showing equal kidney size bilaterally at 9.6 cm in length on the right and 9.7 cm in length on the left. Renal arterial velocities were markedly elevated bilaterally left greater than right with a peak velocity of 577 cm/s on the left and 351 cm/s on the right. These findings are also highly consistent with severe bilateral renal artery stenosis. He is  referred for consideration of catheter angiography.   From a symptomatic perspective, the patient is doing fairly well. He walks 30 minutes per day and denies exertional chest pain or pressure. He denies dyspnea, leg claudication symptoms, orthopnea,  PND, or edema. He continues to smoke a pipe. He drinks 2-3 ounces of bourbon per day. 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 He has a very supportive family with his wife and daughters present today. He has a strong family history of cardiovascular disease. His brother had a myocardial infarction and his daughter has had a DVT.   Hospital Course:  He presented to [facility] on 11/27/11 and underwent bilateral renal angiography which revealed 70% proximal right artery stenosis and 90% prox left renal artery stenosis.  Left Renal PTA/Stenting was performed with consideration for right renal artery stenting in the future. He tolerated the procedure well without complications. He was initiated on DAPT w/ ASA and Plavix with recommendations for continuation for at least 30 days. He was observed overnight and hydrated. Crt on day of discharge was 1.12. K+ was 3.0 which was supplemented. He will have BMET next week.  He was seen and evaluated by Dr. Excell Seltzer who felt he was stable for discharge home with plans for follow up as scheduled below.  Discharge Vitals: Blood pressure 160/67, pulse 64, temperature 98 F (36.7 C), temperature source Oral, resp. rate 16, height 5\' 9"  (1.753 m), weight 132 lb 11.5 oz (60.2 kg), SpO2 93.00%.  Labs:  Component Value Date   WBC 11.1* 11/27/2011   HGB 11.5* 11/27/2011   HCT 34.9* 11/27/2011   MCV 84.9 11/27/2011   PLT 123* 11/27/2011    Lab 11/28/11 1017  NA 137  K 3.0*  CL 102  CO2 23  BUN 21  CREATININE 1.12  CALCIUM 8.9  GLUCOSE 237*    Discharge Medications   Medication List  As of 11/28/2011 11:21 AM   TAKE these medications         amLODipine 10 MG tablet   Commonly known as: NORVASC   Take 10 mg by mouth daily.      aspirin EC 81 MG tablet   Take 81 mg by mouth daily.      carvedilol 12.5 MG tablet   Commonly known as: COREG   Take 12.5 mg by mouth 2 (two) times daily with a meal.      clopidogrel 75 MG tablet   Commonly  known as: PLAVIX   Take 1 tablet (75 mg total) by mouth daily.      ezetimibe 10 MG tablet   Commonly known as: ZETIA   Take 10 mg by mouth daily.      fenofibrate 145 MG tablet   Commonly known as: TRICOR   Take 145 mg by mouth daily.      furosemide 20 MG tablet   Commonly known as: LASIX   Take 20 mg by mouth daily. Will stop 4-8 for procedure        potassium chloride 10 MEQ tablet   Commonly known as: K-DUR,KLOR-CON   Take 1 tablet (10 mEq total) by mouth daily.      rosuvastatin 40 MG tablet   Commonly known as: CRESTOR   Take 40 mg by mouth daily.      TAMSULOSIN HCL PO   Take 0.5 mg by mouth daily.      Vitamin D3 2000 UNITS Tabs   Take 2,000 mg by mouth daily.  Disposition   Discharge Orders    Future Appointments: Provider: Department: Dept Phone: Center:   12/04/2011 11:10 AM [facility] [employer] [facility] [phone] [facility]   12/12/2011 8:30 AM [facility] 1 [facility]  None   01/16/2012 10:30 AM Tonny Bollman, MD [facility] [phone] [facility]     Future Orders Please Complete By Expires   Diet - low sodium heart healthy      Increase activity slowly      Discharge instructions      Comments:   **PLEASE REMEMBER TO BRING ALL OF YOUR MEDICATIONS TO EACH OF YOUR FOLLOW-UP OFFICE VISITS.  * KEEP GROIN SITE CLEAN AND DRY. Call the office for any signs of bleedings, pus, swelling, increased pain, or any other concerns. * NO HEAVY LIFTING (>10lbs) OR SEXUAL ACTIVITY X 7 DAYS. * NO DRIVING UNTIL Sunday 11/30/22 * NO SOAKING BATHS, HOT TUBS, POOLS, ETC., X 7 DAYS.       Follow-up Information    Follow up with [facility] on 12/04/2011. (Lab work @ 11:00)    Insurance account manager [address] [place] [address] [phone]      Follow up with [facility] on 12/12/2011. (Renal Arterial Doppler @ 8:30)    Contact information:   [facility] [address] [place] [address] [phone]      Follow up with Tonny Bollman, MD on 01/16/2012. (10:30)    Contact information:   [facility] [address] [place] [address] [phone]          Outstanding Labs/Studies:  1. BMET next week 2. Renal arterial doppler before next office visit  Duration of Discharge Encounter: Greater than 30 minutes including physician and PA time.  Signed, Mirtie Bastyr PA-C 11/28/2011, 11:21 AM

## 2011-11-28 NOTE — Progress Notes (Signed)
    Subjective:  Had an episode of diarrhea last night, otherwise feels fine. No chest pain or swelling.  Objective:  Vital Signs in the last 24 hours: Temp:  [97.5 F (36.4 C)-99.8 F (37.7 C)] 98 F (36.7 C) (04/11 0531) Pulse Rate:  [61-71] 71  (04/11 0531) Resp:  [15-18] 15  (04/11 0531) BP: (117-168)/(56-97) 168/71 mmHg (04/11 0531) SpO2:  [92 %-94 %] 93 % (04/11 0531) Weight:  [60.2 kg (132 lb 11.5 oz)] 60.2 kg (132 lb 11.5 oz) (04/11 0729)  Intake/Output from previous day: 04/10 0701 - 04/11 0700 In: 1785.1 [P.O.:915; I.V.:870.1] Out: 276 [Urine:275; Stool:1]  Physical Exam: Pt is alert and oriented, elderly male in NAD HEENT: normal Neck: JVP - normal, carotids 2+= without bruits Lungs: CTA bilaterally CV: RRR without murmur or gallop Abd: soft, NT, Positive BS, no hepatomegaly Ext: no C/C/E, right groin clear Skin: warm/dry no rash   Lab Results:  Basename 11/27/11 0852  WBC 11.1*  HGB 11.5*  PLT 123*    Basename 11/27/11 0852  NA 139  K 3.5  CL 104  CO2 24  GLUCOSE 111*  BUN 31*  CREATININE 1.37*   No results found for this basename: TROPONINI:2,CK,MB:2 in the last 72 hours  Assessment/Plan:  Renal artery stenosis - s/p left renal stent. ASA 81 mg and plavix 75 mg daily. Plavix x 30 days (needs Rx). Continue same antihypertensive meds. OK to drive Sunday. Discharge today after BMET is back. Check BMET in our office next Wednesday. Follow-up with me in 4 weeks with renal arterial doppler prior to the office visit.  Tonny Bollman, M.D. 11/28/2011, 7:43 AM

## 2011-11-29 ENCOUNTER — Telehealth: Payer: Self-pay | Admitting: Cardiovascular Disease

## 2011-11-29 NOTE — Telephone Encounter (Signed)
Pt's daughter calling stating father had another episode of diarrhea last noc and is c/o pain in neck and shoulder--advised to keep tract of BP, encourage fluids(very weak), watch for any symptoms of thrombus such as pain, SOB, or numbness--try hot and cold compresses to neck and shoulder, may use tylenol for pain--15 minutes alternating--pt's daughter agrees--thinks it's from lying in one place for angiogram--advised to call back or go to nearest ED if pain becomes worse or other symptoms arise--nt

## 2011-11-29 NOTE — Telephone Encounter (Signed)
Pt's dtr calling re renal angiogram on Wednesday, released from hospital yesterday, woke up with pain level 9, stiffness from neck and shoulder, pls call asap

## 2011-12-01 NOTE — Telephone Encounter (Signed)
Agree; thx 

## 2011-12-04 ENCOUNTER — Encounter: Payer: Self-pay | Admitting: Cardiovascular Disease

## 2011-12-04 ENCOUNTER — Ambulatory Visit (INDEPENDENT_AMBULATORY_CARE_PROVIDER_SITE_OTHER): Payer: MEDICARE | Admitting: *Deleted

## 2011-12-04 DIAGNOSIS — I701 Atherosclerosis of renal artery: Secondary | ICD-10-CM

## 2011-12-04 LAB — BASIC METABOLIC PANEL
BUN: 35 mg/dL — ABNORMAL HIGH (ref 6–23)
Calcium: 9.2 mg/dL (ref 8.4–10.5)
Creatinine, Ser: 1.5 mg/dL (ref 0.4–1.5)

## 2011-12-04 NOTE — Telephone Encounter (Signed)
This encounter was created in error - please disregard.

## 2011-12-04 NOTE — Telephone Encounter (Signed)
New Problem:      Patient's daughter called in wanting to know the results of her father's blood work.  Please call back.

## 2011-12-11 ENCOUNTER — Other Ambulatory Visit: Payer: Self-pay | Admitting: Cardiology

## 2011-12-11 DIAGNOSIS — I10 Essential (primary) hypertension: Secondary | ICD-10-CM | POA: Diagnosis not present

## 2011-12-11 DIAGNOSIS — D649 Anemia, unspecified: Secondary | ICD-10-CM | POA: Diagnosis not present

## 2011-12-11 DIAGNOSIS — E785 Hyperlipidemia, unspecified: Secondary | ICD-10-CM | POA: Diagnosis not present

## 2011-12-11 DIAGNOSIS — I701 Atherosclerosis of renal artery: Secondary | ICD-10-CM | POA: Diagnosis not present

## 2011-12-11 DIAGNOSIS — I129 Hypertensive chronic kidney disease with stage 1 through stage 4 chronic kidney disease, or unspecified chronic kidney disease: Secondary | ICD-10-CM | POA: Diagnosis not present

## 2011-12-12 ENCOUNTER — Telehealth: Payer: Self-pay | Admitting: Cardiovascular Disease

## 2011-12-12 ENCOUNTER — Other Ambulatory Visit: Payer: Self-pay | Admitting: Cardiology

## 2011-12-12 ENCOUNTER — Encounter (INDEPENDENT_AMBULATORY_CARE_PROVIDER_SITE_OTHER): Payer: MEDICARE

## 2011-12-12 DIAGNOSIS — I701 Atherosclerosis of renal artery: Secondary | ICD-10-CM

## 2011-12-12 DIAGNOSIS — I714 Abdominal aortic aneurysm, without rupture: Secondary | ICD-10-CM | POA: Diagnosis not present

## 2011-12-12 DIAGNOSIS — I313 Pericardial effusion (noninflammatory): Secondary | ICD-10-CM

## 2011-12-12 NOTE — Telephone Encounter (Signed)
Pt's dtr calling re pt in this am to have an US showed fluid around his heart she wants to know about this since she wasn't able to come

## 2011-12-12 NOTE — Telephone Encounter (Signed)
I spoke with Missy and when she did the pt's renal duplex this morning she noticed an incidental finding of a pericardial effusion.  Missy called and spoke with Dr Excell Seltzer and he ordered an echocardiogram to evaluate pericardial effusion. I spoke with Marylu Lund and made her aware of the reason for echocardiogram.  She plans on coming with the pt for echo.

## 2011-12-14 ENCOUNTER — Other Ambulatory Visit (HOSPITAL_COMMUNITY): Payer: Self-pay | Admitting: Cardiology

## 2011-12-16 ENCOUNTER — Other Ambulatory Visit: Payer: Self-pay

## 2011-12-16 ENCOUNTER — Ambulatory Visit (HOSPITAL_COMMUNITY): Payer: MEDICARE | Attending: Cardiology

## 2011-12-16 DIAGNOSIS — I319 Disease of pericardium, unspecified: Secondary | ICD-10-CM | POA: Diagnosis not present

## 2011-12-16 DIAGNOSIS — F17200 Nicotine dependence, unspecified, uncomplicated: Secondary | ICD-10-CM | POA: Insufficient documentation

## 2011-12-16 DIAGNOSIS — I10 Essential (primary) hypertension: Secondary | ICD-10-CM | POA: Insufficient documentation

## 2011-12-16 DIAGNOSIS — Z8249 Family history of ischemic heart disease and other diseases of the circulatory system: Secondary | ICD-10-CM | POA: Insufficient documentation

## 2011-12-16 DIAGNOSIS — I517 Cardiomegaly: Secondary | ICD-10-CM | POA: Insufficient documentation

## 2011-12-16 DIAGNOSIS — I519 Heart disease, unspecified: Secondary | ICD-10-CM | POA: Insufficient documentation

## 2011-12-16 DIAGNOSIS — N289 Disorder of kidney and ureter, unspecified: Secondary | ICD-10-CM | POA: Insufficient documentation

## 2011-12-16 DIAGNOSIS — F1010 Alcohol abuse, uncomplicated: Secondary | ICD-10-CM | POA: Insufficient documentation

## 2011-12-16 DIAGNOSIS — E119 Type 2 diabetes mellitus without complications: Secondary | ICD-10-CM | POA: Insufficient documentation

## 2011-12-16 DIAGNOSIS — I313 Pericardial effusion (noninflammatory): Secondary | ICD-10-CM

## 2011-12-16 DIAGNOSIS — I059 Rheumatic mitral valve disease, unspecified: Secondary | ICD-10-CM | POA: Insufficient documentation

## 2011-12-17 ENCOUNTER — Telehealth: Payer: Self-pay | Admitting: Cardiovascular Disease

## 2011-12-17 NOTE — Telephone Encounter (Signed)
Pt and daughter aware of Renal and Myoview results by phone.  Pt scheduled to see Ward Givens NP on 12/25/11.  I made the pt aware that he can stop Plavix when he finishes his current supply.

## 2011-12-17 NOTE — Telephone Encounter (Signed)
Pt's dtr calling re getting test results and has med questions

## 2011-12-24 ENCOUNTER — Encounter: Payer: Self-pay | Admitting: *Deleted

## 2011-12-25 ENCOUNTER — Encounter: Payer: Self-pay | Admitting: Nurse Practitioner

## 2011-12-25 ENCOUNTER — Ambulatory Visit
Admission: RE | Admit: 2011-12-25 | Discharge: 2011-12-25 | Disposition: A | Discharge status: Home / Self Care | Payer: MEDICARE | Source: Ambulatory Visit | Attending: Cardiovascular Disease | Admitting: Cardiovascular Disease

## 2011-12-25 ENCOUNTER — Ambulatory Visit (INDEPENDENT_AMBULATORY_CARE_PROVIDER_SITE_OTHER): Payer: MEDICARE | Admitting: Nurse Practitioner

## 2011-12-25 ENCOUNTER — Telehealth: Payer: Self-pay | Admitting: Cardiovascular Disease

## 2011-12-25 VITALS — BP 122/61 | Resp 18 | Ht 69.0 in | Wt 135.8 lb

## 2011-12-25 DIAGNOSIS — I319 Disease of pericardium, unspecified: Secondary | ICD-10-CM

## 2011-12-25 DIAGNOSIS — I701 Atherosclerosis of renal artery: Secondary | ICD-10-CM | POA: Insufficient documentation

## 2011-12-25 DIAGNOSIS — I313 Pericardial effusion (noninflammatory): Secondary | ICD-10-CM | POA: Insufficient documentation

## 2011-12-25 LAB — CBC WITH DIFFERENTIAL/PLATELET
Basophils Absolute: 0 10*3/uL (ref 0.0–0.1)
Basophils Relative: 0.4 % (ref 0.0–3.0)
Eosinophils Absolute: 0.2 10*3/uL (ref 0.0–0.7)
HCT: 37.5 % — ABNORMAL LOW (ref 39.0–52.0)
Hemoglobin: 12.4 g/dL — ABNORMAL LOW (ref 13.0–17.0)
Lymphs Abs: 1.5 10*3/uL (ref 0.7–4.0)
MCHC: 33.1 g/dL (ref 30.0–36.0)
MCV: 84.6 fl (ref 78.0–100.0)
Neutro Abs: 5.7 10*3/uL (ref 1.4–7.7)
RDW: 15.8 % — ABNORMAL HIGH (ref 11.5–14.6)

## 2011-12-25 LAB — BASIC METABOLIC PANEL
BUN: 35 mg/dL — ABNORMAL HIGH (ref 6–23)
CO2: 26 mEq/L (ref 19–32)
Chloride: 104 mEq/L (ref 96–112)
Creatinine, Ser: 1.4 mg/dL (ref 0.4–1.5)
Glucose, Bld: 119 mg/dL — ABNORMAL HIGH (ref 70–99)
Potassium: 3.7 mEq/L (ref 3.5–5.1)

## 2011-12-25 LAB — TSH: TSH: 1.21 u[IU]/mL (ref 0.35–5.50)

## 2011-12-25 NOTE — Telephone Encounter (Signed)
Lauren, In case she calls back with other questions, the w/u @ this point is to r/o possible etiologies of pericardial effusion.  The CXR was done to ensure that he didn't have a big lung nodule/CA.  Thanks,  Thayer Ohm

## 2011-12-25 NOTE — Telephone Encounter (Signed)
New Problem:     Patient's daughter called wanting to know about why her father needed a chest X-ray today and also what the results of it was.  Please call back.

## 2011-12-25 NOTE — Telephone Encounter (Signed)
I spoke with the pt's daugther and made her aware of chest x-ray results.  I made her aware that this was done so that a baseline chest x-ray would be on file.

## 2011-12-25 NOTE — Progress Notes (Signed)
Patient Name: Chris Peterson Date of Encounter: 12/25/2011  Primary Care Provider:  Daisy Floro, MD, MD Primary Cardiologist:  Judie Petit. Excell Seltzer, MD  Patient Profile  78 y/o male with h/o bilat renal artery stenosis who was recently noted to have a moderate pericardial effusion.  Problem List   Past Medical History  Diagnosis Date  . HTN (hypertension)   . High cholesterol   . Dysrhythmia     "fast one time"  . Type II diabetes mellitus   . Hemorrhoids   . Blood transfusion     S/P "lost blood due to hemorrhoids"  . Kidney disease, chronic, stage III (moderate, EGFR 30-59 ml/min)   . Atherosclerotic renal artery stenosis, bilateral     Bilateral renal angiography 11/27/11, Right 70% prox, Left 90% prox s/p LRA PTA/Stenting - 6.0x38mm ;  12/12/2011 Renal Duplex: Patent LRA stent & > 60% RRA ost stenosis  . AAA (abdominal aortic aneurysm)     Infrarenal AAA - maximal diameter of 3.5 cm   . Pericardial effusion     a. incidentally noted on renal u/s 11/2011;  b.  12/16/2011 Echo:  EF 55-60%, Gr1 DD, Mild MR, PASP 32, Mod circumferential peric effusion w/o hemodynamic compromise. Density in pericardial space - consider MRI   Past Surgical History  Procedure Date  . Renal artery stent 11/27/11  . Cataract extraction w/ intraocular lens  implant, bilateral 1990's  . Nasal septum surgery 1980's    Allergies  No Known Allergies  HPI  78 y/o male with the above problem list.  He is s/p L renal artery stenting in early April.  Follow-up renal duplex a few weeks ago, showed patency of the LRA stent with residual >60% ostial RRA stenosis.  Incidentally noted was a pericardial effusion.  Dedicated echo was performed 4/29 and pt was found to have a moderate circumferential pericardial effusion w/o hemodynamic compromise.  He presents today for f/u.  He is quite active @ home and walks on the treadmill for 30 mins/day, 7 days/week.  He has not noted any change in his exercise capacity while  on the treadmill but has noted that he fatigues a little more easily when he's doing yard work.  He denies chest pain, dyspnea, orthopnea, presyncope/syncope, early satiety, or edema.  He's noted that his BP has been improved since LRA stenting.  Home Medications  Prior to Admission medications   Medication Sig Start Date End Date Taking? Authorizing Provider  amLODipine (NORVASC) 10 MG tablet Take 10 mg by mouth daily.   Yes Historical Provider, MD  aspirin EC 81 MG tablet Take 81 mg by mouth daily.   Yes Historical Provider, MD  carvedilol (COREG) 12.5 MG tablet Take 12.5 mg by mouth 2 (two) times daily with a meal.   Yes Historical Provider, MD  Cholecalciferol (VITAMIN D3) 2000 UNITS TABS Take 2,000 mg by mouth daily.    Yes Historical Provider, MD  clopidogrel (PLAVIX) 75 MG tablet Take 1 tablet (75 mg total) by mouth daily. 11/28/11  Yes Jessica A Hope, PA-C  ezetimibe (ZETIA) 10 MG tablet Take 10 mg by mouth daily.   Yes Historical Provider, MD  fenofibrate (TRICOR) 145 MG tablet Take 145 mg by mouth daily.   Yes Historical Provider, MD  furosemide (LASIX) 20 MG tablet Take 20 mg by mouth daily. Will stop 4-8 for procedure    Yes Historical Provider, MD  potassium chloride SA (K-DUR,KLOR-CON) 10 MEQ tablet Take 1 tablet (10 mEq total) by  mouth daily. 11/28/11 11/27/12 Yes Jessica A Hope, PA-C  rosuvastatin (CRESTOR) 40 MG tablet Take 40 mg by mouth daily.   Yes Historical Provider, MD  TAMSULOSIN HCL PO Take 0.5 mg by mouth daily.   Yes Historical Provider, MD   Review of Systems Slight decrease in exercise tolerance with performing yard work.  Otherwise, all other systems reviewed and are otherwise negative except as noted above.  Physical Exam  Blood pressure 122/61, resp. rate 18, height 5\' 9"  (1.753 m), weight 135 lb 12.8 oz (61.598 kg). No pulsus paradoxus. General: Pleasant, NAD Psych: Normal affect. Neuro: Alert and oriented X 3. Moves all extremities spontaneously. HEENT:  Normal  Neck: Supple without bruits or JVD. Lungs:  Resp regular and unlabored, CTA. Heart: RRR no s3, s4, or murmurs.  No rub. Abdomen: Soft, non-tender, non-distended, BS + x 4.  Extremities: No clubbing, cyanosis or edema. DP/PT/Radials 2+ and equal bilaterally.  Accessory Clinical Findings  ECG - sinus brady, 55, rbbb, no acute st/t changes.  Assessment & Plan  1.  Moderate Pericardial Effusion:  He has noted some decrease in his exercise tolerance while doing yard work but does not note similar decrease when walking on treadmill for 30 minutes daily.  He denies and chest pain (pleuritic or otherwise), dyspnea, or orthopnea.  Case discussed with Dr. Excell Seltzer.  In the absence of hemodynamic compromise and/or symptoms, we will not pursue pericardiocentesis @ this time.  We will get baseline serologies to assist [place] in determining the etiology of this effusion, including cbc, bmet, tsh, anti-double stranded dna, complement, and cxr.  We'll f/u echo prior to next visit with Dr. Excell Seltzer on May 30th.  It should be noted that prior echo report noted density in pericardial space and we'll consider cardiac MRI following next echo.  2.  Bilateral RAS:  Doing well s/p L renal artery PTA/stenting.  He has residual ostial R renal artery dzs.  Will f/u bmet today and Dr. Excell Seltzer will discuss possible staged intervention of the R renal artery with nephrology.  3.  HTN:  Stable.  Pt has noted improvement since L RA stenting.  4.  Dispo:  Lab work as noted above.  F/U Dr. Excell Seltzer @ the end of the month as scheduled.   Nicolasa Ducking, NP 12/25/2011, 12:33 PM

## 2011-12-25 NOTE — Patient Instructions (Signed)
Please have blood work done today.  These results will be sent to your provider  A chest x-ray takes a picture of the organs and structures inside the chest, including the heart, lungs, and blood vessels. This test can show several things, including, whether the heart is enlarges; whether fluid is building up in the lungs; and whether pacemaker / defibrillator leads are still in place.  Your physician has requested that you have an echocardiogram. Echocardiography is a painless test that uses sound waves to create images of your heart. It provides your doctor with information about the size and shape of your heart and how well your heart's chambers and valves are working. This procedure takes approximately one hour. There are no restrictions for this procedure.  These tests will be scheduled upon check-out today.  No medication changes made today

## 2011-12-26 ENCOUNTER — Telehealth: Payer: Self-pay | Admitting: Cardiovascular Disease

## 2011-12-26 NOTE — Telephone Encounter (Signed)
New msg [facility] lab wants to talk to you about lab order

## 2011-12-26 NOTE — Telephone Encounter (Signed)
Lab states they didn't receive a frozen sample for [facility] 50, spoke with keith he said there wasn't an order for one and one wasn't drawn. If you need this test pt will need to come in for draw. I will forward to dr/nurse

## 2011-12-27 ENCOUNTER — Telehealth: Payer: Self-pay | Admitting: Cardiovascular Disease

## 2011-12-27 MED ORDER — POTASSIUM CHLORIDE CRYS ER 10 MEQ PO TBCR: 10.0000 meq | EXTENDED_RELEASE_TABLET | Freq: Every day | ORAL | Status: DC

## 2011-12-27 NOTE — Telephone Encounter (Signed)
I spoke with [facility] lab and the [facility] 50 is a total complement.  This order was actually cancelled in our system.  The anti-DNA antibody should be resulted by tomorrow. Pt does not need to come back at this time for labs because the [facility] 50 was a cancelled test.

## 2011-12-27 NOTE — Telephone Encounter (Signed)
New problem:     Patient's daughter called in needing a reifll of her father's potassium chloride SA (K-DUR,KLOR-CON) 10 MEQ tablet.

## 2012-01-16 ENCOUNTER — Ambulatory Visit (INDEPENDENT_AMBULATORY_CARE_PROVIDER_SITE_OTHER): Payer: Commercial Managed Care - PPO | Admitting: Cardiovascular Disease

## 2012-01-16 ENCOUNTER — Other Ambulatory Visit: Payer: Self-pay

## 2012-01-16 ENCOUNTER — Ambulatory Visit (HOSPITAL_COMMUNITY): Payer: Medicare Other | Attending: Cardiovascular Disease

## 2012-01-16 ENCOUNTER — Encounter: Payer: Self-pay | Admitting: Cardiovascular Disease

## 2012-01-16 VITALS — BP 112/68 | HR 55 | Ht 69.0 in | Wt 135.0 lb

## 2012-01-16 DIAGNOSIS — I701 Atherosclerosis of renal artery: Secondary | ICD-10-CM

## 2012-01-16 DIAGNOSIS — I313 Pericardial effusion (noninflammatory): Secondary | ICD-10-CM

## 2012-01-16 DIAGNOSIS — I714 Abdominal aortic aneurysm, without rupture, unspecified: Secondary | ICD-10-CM | POA: Insufficient documentation

## 2012-01-16 DIAGNOSIS — I129 Hypertensive chronic kidney disease with stage 1 through stage 4 chronic kidney disease, or unspecified chronic kidney disease: Secondary | ICD-10-CM | POA: Diagnosis not present

## 2012-01-16 DIAGNOSIS — E785 Hyperlipidemia, unspecified: Secondary | ICD-10-CM | POA: Insufficient documentation

## 2012-01-16 DIAGNOSIS — I319 Disease of pericardium, unspecified: Secondary | ICD-10-CM

## 2012-01-16 DIAGNOSIS — Z87891 Personal history of nicotine dependence: Secondary | ICD-10-CM | POA: Diagnosis not present

## 2012-01-16 DIAGNOSIS — E119 Type 2 diabetes mellitus without complications: Secondary | ICD-10-CM | POA: Diagnosis not present

## 2012-01-16 DIAGNOSIS — N189 Chronic kidney disease, unspecified: Secondary | ICD-10-CM | POA: Insufficient documentation

## 2012-01-16 DIAGNOSIS — I517 Cardiomegaly: Secondary | ICD-10-CM | POA: Diagnosis not present

## 2012-01-16 NOTE — Patient Instructions (Signed)
Your physician has requested that you have an echocardiogram in 3 months. Echocardiography is a painless test that uses sound waves to create images of your heart. It provides your doctor with information about the size and shape of your heart and how well your heart's chambers and valves are working. This procedure takes approximately one hour. There are no restrictions for this procedure.   Your physician recommends that you schedule a follow-up appointment in: 3 months with Dr. Excell Seltzer just after echo.

## 2012-01-23 ENCOUNTER — Encounter: Payer: Self-pay | Admitting: Cardiovascular Disease

## 2012-01-23 NOTE — Progress Notes (Signed)
HPI:  This is a 78 year old gentleman presenting for followup evaluation. The patient has bilateral renal artery stenosis and recently underwent left renal artery stenting for critical stenosis in that vessel. The right renal artery is moderately diseased and is being managed medically. He was incidentally noted to have a moderate pericardial effusion on imaging studies. The patient is physically active without exertional symptoms. He specifically denies edema, orthopnea, PND, or exertional dyspnea. He's had no chest pain or pleuritic symptoms. He denies constitutional symptoms such as fevers, chills, night sweats, or weight loss. He has undergone a followup echocardiogram earlier today that demonstrates a moderate pericardial effusion, unchanged from the previous exam one month ago. Serologies have been drawn and showed no significant abnormalities.  Outpatient Encounter Prescriptions as of 01/16/2012  Medication Sig Dispense Refill  . amLODipine (NORVASC) 10 MG tablet Take 10 mg by mouth daily.      [name] aspirin EC 81 MG tablet Take 81 mg by mouth daily.      . carvedilol (COREG) 12.5 MG tablet Take 12.5 mg by mouth 2 (two) times daily with a meal.      . Cholecalciferol (VITAMIN D3) 2000 UNITS TABS Take 2,000 mg by mouth daily.       [name] ezetimibe (ZETIA) 10 MG tablet Take 10 mg by mouth daily.      . fenofibrate (TRICOR) 145 MG tablet Take 145 mg by mouth daily.      . furosemide (LASIX) 20 MG tablet Take 20 mg by mouth daily. Will stop 4-8 for procedure       . potassium chloride (K-DUR,KLOR-CON) 10 MEQ tablet Take 1 tablet (10 mEq total) by mouth daily.  30 tablet  6  . rosuvastatin (CRESTOR) 40 MG tablet Take 40 mg by mouth daily.      [name] TAMSULOSIN HCL PO Take 0.5 mg by mouth daily.      [name] DISCONTD: clopidogrel (PLAVIX) 75 MG tablet Take 1 tablet (75 mg total) by mouth daily.  30 tablet  3    No Known Allergies  Past Medical History  Diagnosis Date  . HTN (hypertension)   . High  cholesterol   . Dysrhythmia     "fast one time"  . Type II diabetes mellitus   . Hemorrhoids   . Blood transfusion     S/P "lost blood due to hemorrhoids"  . Kidney disease, chronic, stage III (moderate, EGFR 30-59 ml/min)   . Atherosclerotic renal artery stenosis, bilateral     Bilateral renal angiography 11/27/11, Right 70% prox, Left 90% prox s/p LRA PTA/Stenting - 6.0x61mm ;  12/12/2011 Renal Duplex: Patent LRA stent & > 60% RRA ost stenosis  . AAA (abdominal aortic aneurysm)     Infrarenal AAA - maximal diameter of 3.5 cm   . Pericardial effusion     a. incidentally noted on renal u/s 11/2011;  b.  12/16/2011 Echo:  EF 55-60%, Gr1 DD, Mild MR, PASP 32, Mod circumferential peric effusion w/o hemodynamic compromise. Density in pericardial space - consider MRI    ROS: Negative except as per HPI  BP 112/68  Pulse 55  Ht 5\' 9"  (1.753 m)  Wt 61.236 kg (135 lb)  BMI 19.94 kg/m2  PHYSICAL EXAM: Pt is alert and oriented, elderly male in NAD There is no pulsus paradox on exam. HEENT: normal Neck: JVP - normal, carotids 2+= without bruits Lungs: CTA bilaterally CV: RRR without murmur or gallop Abd: soft, NT, Positive BS, no hepatomegaly Ext: no C/C/E, distal  pulses intact and equal Skin: warm/dry no rash  EKG:  Sinus bradycardia 55 beats per minute, right bundle branch block.  ASSESSMENT AND PLAN: 1. Renal artery stenosis. The patient has undergone left renal artery stenting. The right renal artery will be managed medically. His chronic kidney disease appears stable his blood pressure is very well-controlled on his current regimen. He will continue with regular followup with Dr. Darrick Penna.  2. Idiopathic pericardial effusion. There is no evidence of Non-physiology either on exam or by echocardiography. The patient has no systemic symptoms, no significant abnormalities on chest x-ray, and no major lab abnormalities. Will continue with regular followup.  3. Hypertension. Well controlled  on current medical program.  4. Followup: 3 months with an echocardiogram in the office visit.  Tonny Bollman 01/23/2012

## 2012-02-03 DIAGNOSIS — E119 Type 2 diabetes mellitus without complications: Secondary | ICD-10-CM | POA: Diagnosis not present

## 2012-02-19 DIAGNOSIS — I129 Hypertensive chronic kidney disease with stage 1 through stage 4 chronic kidney disease, or unspecified chronic kidney disease: Secondary | ICD-10-CM | POA: Diagnosis not present

## 2012-02-19 DIAGNOSIS — E785 Hyperlipidemia, unspecified: Secondary | ICD-10-CM | POA: Diagnosis not present

## 2012-02-19 DIAGNOSIS — I701 Atherosclerosis of renal artery: Secondary | ICD-10-CM | POA: Diagnosis not present

## 2012-03-11 DIAGNOSIS — E119 Type 2 diabetes mellitus without complications: Secondary | ICD-10-CM | POA: Diagnosis not present

## 2012-04-14 ENCOUNTER — Ambulatory Visit (INDEPENDENT_AMBULATORY_CARE_PROVIDER_SITE_OTHER): Payer: Commercial Managed Care - PPO | Admitting: Cardiovascular Disease

## 2012-04-14 ENCOUNTER — Encounter: Payer: Self-pay | Admitting: Cardiovascular Disease

## 2012-04-14 ENCOUNTER — Ambulatory Visit (HOSPITAL_COMMUNITY): Payer: MEDICARE | Attending: Cardiology

## 2012-04-14 VITALS — BP 127/63 | HR 59 | Ht 69.0 in | Wt 137.0 lb

## 2012-04-14 DIAGNOSIS — I701 Atherosclerosis of renal artery: Secondary | ICD-10-CM

## 2012-04-14 DIAGNOSIS — I10 Essential (primary) hypertension: Secondary | ICD-10-CM

## 2012-04-14 DIAGNOSIS — I319 Disease of pericardium, unspecified: Secondary | ICD-10-CM

## 2012-04-14 DIAGNOSIS — N189 Chronic kidney disease, unspecified: Secondary | ICD-10-CM | POA: Diagnosis not present

## 2012-04-14 DIAGNOSIS — E119 Type 2 diabetes mellitus without complications: Secondary | ICD-10-CM | POA: Insufficient documentation

## 2012-04-14 DIAGNOSIS — I313 Pericardial effusion (noninflammatory): Secondary | ICD-10-CM

## 2012-04-14 DIAGNOSIS — I129 Hypertensive chronic kidney disease with stage 1 through stage 4 chronic kidney disease, or unspecified chronic kidney disease: Secondary | ICD-10-CM | POA: Diagnosis not present

## 2012-04-14 NOTE — Progress Notes (Signed)
Echocardiogram performed.  

## 2012-04-14 NOTE — Patient Instructions (Addendum)
Your physician has requested that you have a renal artery duplex in April 2014. During this test, an ultrasound is used to evaluate blood flow to the kidneys. Allow one hour for this exam. Do not eat after midnight the day before and avoid carbonated beverages. Take your medications as you usually do.  Your physician wants you to follow-up in: April 2014 with Dr Excell Seltzer.  You will receive a reminder letter in the mail two months in advance. If you don't receive a letter, please call our office to schedule the follow-up appointment.  Your physician recommends that you continue on your current medications as directed. Please refer to the Current Medication list given to you today.

## 2012-04-14 NOTE — Assessment & Plan Note (Signed)
The patient appears stable. His blood pressure remains well-controlled. His renal function has been stable based on his reports to me. I would like for him to have a followup renal arterial duplex in April 2014 when he is out to 12 months from his stenting procedure. This will also help follow his right renal artery stenosis which is been managed medically. I agree that medical management for the contralateral renal artery is appropriate in this gentleman with stable renal function and well-controlled blood pressure.

## 2012-04-14 NOTE — Progress Notes (Signed)
   HPI:  78 year old gentleman presenting for followup evaluation. The patient is followed for bilateral renal artery stenosis. He underwent stenting of a critical stenosis in the left renal artery several months ago. His right renal artery stenosis has been managed medically.  The patient has been incidentally noted to have a pericardial effusion without symptoms attributable to this. He underwent serologic evaluation demonstrating no clear cause.  From a symptomatic perspective, the patient is doing very well. He denies chest pain, shortness of breath, swelling, orthopnea, or PND. He's had no lightheadedness or syncope. He is compliant with his medications.  Outpatient Encounter Prescriptions as of 04/14/2012  Medication Sig Dispense Refill  . amLODipine (NORVASC) 10 MG tablet Take 10 mg by mouth daily.      [name] aspirin EC 81 MG tablet Take 81 mg by mouth daily.      . carvedilol (COREG) 12.5 MG tablet Take 12.5 mg by mouth 2 (two) times daily with a meal.      . Cholecalciferol (VITAMIN D3) 2000 UNITS TABS Take 2,000 mg by mouth daily.       [name] ezetimibe (ZETIA) 10 MG tablet Take 10 mg by mouth daily.      . fenofibrate (TRICOR) 145 MG tablet Take 145 mg by mouth daily.      . furosemide (LASIX) 20 MG tablet Take 20 mg by mouth daily. Will stop 4-8 for procedure       . potassium chloride (K-DUR,KLOR-CON) 10 MEQ tablet Take 1 tablet (10 mEq total) by mouth daily.  30 tablet  6  . rosuvastatin (CRESTOR) 40 MG tablet Take 40 mg by mouth daily.      [name] TAMSULOSIN HCL PO Take 0.5 mg by mouth daily.        No Known Allergies  Past Medical History  Diagnosis Date  . HTN (hypertension)   . High cholesterol   . Dysrhythmia     "fast one time"  . Type II diabetes mellitus   . Hemorrhoids   . Blood transfusion     S/P "lost blood due to hemorrhoids"  . Kidney disease, chronic, stage III (moderate, EGFR 30-59 ml/min)   . Atherosclerotic renal artery stenosis, bilateral     Bilateral renal  angiography 11/27/11, Right 70% prox, Left 90% prox s/p LRA PTA/Stenting - 6.0x9mm ;  12/12/2011 Renal Duplex: Patent LRA stent & > 60% RRA ost stenosis  . AAA (abdominal aortic aneurysm)     Infrarenal AAA - maximal diameter of 3.5 cm   . Pericardial effusion     a. incidentally noted on renal u/s 11/2011;  b.  12/16/2011 Echo:  EF 55-60%, Gr1 DD, Mild MR, PASP 32, Mod circumferential peric effusion w/o hemodynamic compromise. Density in pericardial space - consider MRI    ROS: Negative except as per HPI  BP 127/63  Pulse 59  Ht 5\' 9"  (1.753 m)  Wt 137 lb (62.143 kg)  BMI 20.23 kg/m2  PHYSICAL EXAM: Pt is alert and oriented, pleasant elderly gentleman in NAD HEENT: normal Neck: JVP - normal, carotids 2+= without bruits Lungs: CTA bilaterally CV: RRR without murmur or gallop Abd: soft, NT, Positive BS, no hepatomegaly Ext: no C/C/E, distal pulses intact and equal Skin: warm/dry no rash  ASSESSMENT AND PLAN:

## 2012-04-14 NOTE — Assessment & Plan Note (Signed)
The patient remains asymptomatic. I have personally reviewed his echo images done today. His pericardial effusion is small and there is no evidence of hemodynamic compromise. He requires no further evaluation at this time.

## 2012-04-14 NOTE — Assessment & Plan Note (Signed)
Blood pressure is well controlled on his current medical program. For followup, I would like to see him back next April after his renal arterial duplex scan.

## 2012-05-13 ENCOUNTER — Other Ambulatory Visit (HOSPITAL_COMMUNITY): Payer: Self-pay | Admitting: Cardiology

## 2012-06-29 DIAGNOSIS — E213 Hyperparathyroidism, unspecified: Secondary | ICD-10-CM | POA: Diagnosis not present

## 2012-06-29 DIAGNOSIS — I701 Atherosclerosis of renal artery: Secondary | ICD-10-CM | POA: Diagnosis not present

## 2012-06-29 DIAGNOSIS — N2581 Secondary hyperparathyroidism of renal origin: Secondary | ICD-10-CM | POA: Diagnosis not present

## 2012-06-29 DIAGNOSIS — E785 Hyperlipidemia, unspecified: Secondary | ICD-10-CM | POA: Diagnosis not present

## 2012-06-29 DIAGNOSIS — Z23 Encounter for immunization: Secondary | ICD-10-CM | POA: Diagnosis not present

## 2012-06-29 DIAGNOSIS — E119 Type 2 diabetes mellitus without complications: Secondary | ICD-10-CM | POA: Diagnosis not present

## 2012-06-29 DIAGNOSIS — I129 Hypertensive chronic kidney disease with stage 1 through stage 4 chronic kidney disease, or unspecified chronic kidney disease: Secondary | ICD-10-CM | POA: Diagnosis not present

## 2012-06-29 DIAGNOSIS — N183 Chronic kidney disease, stage 3 unspecified: Secondary | ICD-10-CM | POA: Diagnosis not present

## 2012-06-29 DIAGNOSIS — I10 Essential (primary) hypertension: Secondary | ICD-10-CM | POA: Diagnosis not present

## 2012-06-30 DIAGNOSIS — E782 Mixed hyperlipidemia: Secondary | ICD-10-CM | POA: Diagnosis not present

## 2012-06-30 DIAGNOSIS — I10 Essential (primary) hypertension: Secondary | ICD-10-CM | POA: Diagnosis not present

## 2012-06-30 DIAGNOSIS — E1129 Type 2 diabetes mellitus with other diabetic kidney complication: Secondary | ICD-10-CM | POA: Diagnosis not present

## 2012-07-02 DIAGNOSIS — H6120 Impacted cerumen, unspecified ear: Secondary | ICD-10-CM | POA: Diagnosis not present

## 2012-07-07 DIAGNOSIS — E782 Mixed hyperlipidemia: Secondary | ICD-10-CM | POA: Diagnosis not present

## 2012-07-07 DIAGNOSIS — E1129 Type 2 diabetes mellitus with other diabetic kidney complication: Secondary | ICD-10-CM | POA: Diagnosis not present

## 2012-07-17 ENCOUNTER — Other Ambulatory Visit: Payer: Self-pay | Admitting: Cardiovascular Disease

## 2012-11-12 ENCOUNTER — Encounter (INDEPENDENT_AMBULATORY_CARE_PROVIDER_SITE_OTHER): Payer: MEDICARE

## 2012-11-12 DIAGNOSIS — I313 Pericardial effusion (noninflammatory): Secondary | ICD-10-CM

## 2012-11-12 DIAGNOSIS — I700 Atherosclerosis of aorta: Secondary | ICD-10-CM | POA: Diagnosis not present

## 2012-11-12 DIAGNOSIS — I701 Atherosclerosis of renal artery: Secondary | ICD-10-CM

## 2012-11-12 DIAGNOSIS — I10 Essential (primary) hypertension: Secondary | ICD-10-CM

## 2012-11-18 ENCOUNTER — Ambulatory Visit (INDEPENDENT_AMBULATORY_CARE_PROVIDER_SITE_OTHER): Payer: MEDICARE | Admitting: Cardiovascular Disease

## 2012-11-18 VITALS — BP 142/70 | HR 56 | Ht 69.0 in | Wt 140.0 lb

## 2012-11-18 DIAGNOSIS — I313 Pericardial effusion (noninflammatory): Secondary | ICD-10-CM

## 2012-11-18 DIAGNOSIS — I701 Atherosclerosis of renal artery: Secondary | ICD-10-CM | POA: Diagnosis not present

## 2012-11-18 DIAGNOSIS — I319 Disease of pericardium, unspecified: Secondary | ICD-10-CM | POA: Diagnosis not present

## 2012-11-18 NOTE — Patient Instructions (Addendum)
Your physician has requested that you have an echocardiogram in [place]. Echocardiography is a painless test that uses sound waves to create images of your heart. It provides your doctor with information about the size and shape of your heart and how well your heart's chambers and valves are working. This procedure takes approximately one hour. There are no restrictions for this procedure.  Your physician wants you to follow-up in: 6 MONTHS with Dr Excell Seltzer.  You will receive a reminder letter in the mail two months in advance. If you don't receive a letter, please call our office to schedule the follow-up appointment.  Your physician recommends that you continue on your current medications as directed. Please refer to the Current Medication list given to you today.

## 2012-11-19 ENCOUNTER — Encounter: Payer: Self-pay | Admitting: Cardiovascular Disease

## 2012-11-19 NOTE — Progress Notes (Signed)
HPI:  79 year old gentleman presenting for followup evaluation. The patient is followed for bilateral renal artery stenosis. He underwent left renal artery stenting last year. He just underwent followup duplex scanning demonstrating patency of his left renal artery. The right renal artery has severe stenosis and has been managed medically. The patient remained stable with reasonably good control of his blood pressure. Last record of his renal function showed a creatinine of 1.3 with a GFR of 50.  He also was incidentally noted to have a pericardial effusion. He underwent serologic evaluation with demonstrated no clear cause. This was followed with serial echo exams demonstrating no significant change.  From a symptomatic perspective the patient is doing well. He denies edema, chest pain, palpitations, lightheadedness, or syncope.  Outpatient Encounter Prescriptions as of 11/18/2012  Medication Sig Dispense Refill  . amLODipine (NORVASC) 10 MG tablet Take 10 mg by mouth daily.      [name] aspirin EC 81 MG tablet Take 81 mg by mouth daily.      . carvedilol (COREG) 12.5 MG tablet Take 12.5 mg by mouth 2 (two) times daily with a meal.      . Cholecalciferol (VITAMIN D3) 2000 UNITS TABS Take 2,000 mg by mouth daily.       [name] ezetimibe (ZETIA) 10 MG tablet Take 10 mg by mouth daily.      . fenofibrate (TRICOR) 145 MG tablet Take 145 mg by mouth daily.      . furosemide (LASIX) 20 MG tablet Take 20 mg by mouth daily. Will stop 4-8 for procedure       . KLOR-CON 10 10 MEQ tablet TAKE 1 TABLET (10 MEQ TOTAL) BY MOUTH DAILY.  30 tablet  0  . rosuvastatin (CRESTOR) 40 MG tablet Take 40 mg by mouth daily.      [name] TAMSULOSIN HCL PO Take 0.5 mg by mouth daily.      . [DISCONTINUED] KLOR-CON 10 10 MEQ tablet TAKE 1 TABLET (10 MEQ TOTAL) BY MOUTH DAILY.  30 tablet  6   No facility-administered encounter medications on file as of 11/18/2012.    No Known Allergies  Past Medical History  Diagnosis Date  . HTN  (hypertension)   . High cholesterol   . Dysrhythmia     "fast one time"  . Type II diabetes mellitus   . Hemorrhoids   . Blood transfusion     S/P "lost blood due to hemorrhoids"  . Kidney disease, chronic, stage III (moderate, EGFR 30-59 ml/min)   . Atherosclerotic renal artery stenosis, bilateral     Bilateral renal angiography 11/27/11, Right 70% prox, Left 90% prox s/p LRA PTA/Stenting - 6.0x50mm ;  12/12/2011 Renal Duplex: Patent LRA stent & > 60% RRA ost stenosis  . AAA (abdominal aortic aneurysm)     Infrarenal AAA - maximal diameter of 3.5 cm   . Pericardial effusion     a. incidentally noted on renal u/s 11/2011;  b.  12/16/2011 Echo:  EF 55-60%, Gr1 DD, Mild MR, PASP 32, Mod circumferential peric effusion w/o hemodynamic compromise. Density in pericardial space - consider MRI    ROS: Negative except as per HPI  BP 142/70  Pulse 56  Ht 5\' 9"  (1.753 m)  Wt 63.504 kg (140 lb)  BMI 20.67 kg/m2  PHYSICAL EXAM: Pt is alert and oriented, pleasant elderly male in NAD HEENT: normal Neck: JVP - normal, carotids 2+= with bilateral bruits Lungs: CTA bilaterally CV: RRR without murmur or gallop Abd: soft, NT,  Positive BS, no hepatomegaly Ext: no C/C/E, distal pulses intact and equal Skin: warm/dry no rash  ASSESSMENT AND PLAN: 1. Renal artery stenosis. The patient's duplex was reviewed with stable findings noted. He does have severe right renal artery stenosis with normal right kidney size but it is 1.6 cm smaller than the left. Considering the stability of his renal function and control of his blood pressure, will continue to manage conservatively. He is followed by Dr. Darrick Penna. Recommend a repeat renal arterial duplex in 1 year.  2. Pericardial effusion, idiopathic. Will repeat an echo exam as his last study was in August 2013. No clear symptoms attributable to this.  3. Hypertension. Controlled on carvedilol and amlodipine.  4. Hyperlipidemia. He is treated with a combination  of Crestor and zetia.  Tonny Bollman 11/19/2012 1:11 PM

## 2012-11-26 ENCOUNTER — Encounter: Payer: Self-pay | Admitting: Cardiovascular Disease

## 2012-11-26 DIAGNOSIS — I701 Atherosclerosis of renal artery: Secondary | ICD-10-CM | POA: Diagnosis not present

## 2012-12-30 DIAGNOSIS — I10 Essential (primary) hypertension: Secondary | ICD-10-CM | POA: Diagnosis not present

## 2012-12-30 DIAGNOSIS — Z0000 Encounter for general adult medical examination without abnormal findings: Secondary | ICD-10-CM | POA: Diagnosis not present

## 2012-12-30 DIAGNOSIS — E119 Type 2 diabetes mellitus without complications: Secondary | ICD-10-CM | POA: Diagnosis not present

## 2012-12-30 DIAGNOSIS — E1129 Type 2 diabetes mellitus with other diabetic kidney complication: Secondary | ICD-10-CM | POA: Diagnosis not present

## 2012-12-30 DIAGNOSIS — E782 Mixed hyperlipidemia: Secondary | ICD-10-CM | POA: Diagnosis not present

## 2013-02-03 ENCOUNTER — Other Ambulatory Visit: Payer: Self-pay | Admitting: Cardiovascular Disease

## 2013-02-08 ENCOUNTER — Other Ambulatory Visit: Payer: Self-pay | Admitting: Cardiovascular Disease

## 2013-02-24 ENCOUNTER — Ambulatory Visit (HOSPITAL_COMMUNITY): Payer: MEDICARE | Attending: Cardiovascular Disease | Admitting: Radiology

## 2013-02-24 DIAGNOSIS — I319 Disease of pericardium, unspecified: Secondary | ICD-10-CM

## 2013-02-24 DIAGNOSIS — I079 Rheumatic tricuspid valve disease, unspecified: Secondary | ICD-10-CM | POA: Insufficient documentation

## 2013-02-24 DIAGNOSIS — I059 Rheumatic mitral valve disease, unspecified: Secondary | ICD-10-CM | POA: Insufficient documentation

## 2013-02-24 DIAGNOSIS — E119 Type 2 diabetes mellitus without complications: Secondary | ICD-10-CM | POA: Diagnosis not present

## 2013-02-24 DIAGNOSIS — E785 Hyperlipidemia, unspecified: Secondary | ICD-10-CM | POA: Diagnosis not present

## 2013-02-24 DIAGNOSIS — I10 Essential (primary) hypertension: Secondary | ICD-10-CM | POA: Diagnosis not present

## 2013-02-24 DIAGNOSIS — I701 Atherosclerosis of renal artery: Secondary | ICD-10-CM

## 2013-02-24 DIAGNOSIS — I313 Pericardial effusion (noninflammatory): Secondary | ICD-10-CM

## 2013-02-24 NOTE — Progress Notes (Signed)
Echocardiogram performed.  

## 2013-03-02 ENCOUNTER — Encounter: Payer: Self-pay | Admitting: Cardiovascular Disease

## 2013-03-02 NOTE — Telephone Encounter (Signed)
New Problem  Pt is wanting the results of his echo.

## 2013-03-03 NOTE — Telephone Encounter (Signed)
This encounter was created in error - please disregard.

## 2013-03-04 DIAGNOSIS — H6120 Impacted cerumen, unspecified ear: Secondary | ICD-10-CM | POA: Diagnosis not present

## 2013-03-18 DIAGNOSIS — E119 Type 2 diabetes mellitus without complications: Secondary | ICD-10-CM | POA: Diagnosis not present

## 2013-05-06 ENCOUNTER — Ambulatory Visit (INDEPENDENT_AMBULATORY_CARE_PROVIDER_SITE_OTHER): Payer: MEDICARE | Admitting: Cardiovascular Disease

## 2013-05-06 ENCOUNTER — Encounter: Payer: Self-pay | Admitting: Cardiovascular Disease

## 2013-05-06 VITALS — BP 124/67 | HR 67 | Ht 69.0 in | Wt 138.0 lb

## 2013-05-06 DIAGNOSIS — I313 Pericardial effusion (noninflammatory): Secondary | ICD-10-CM

## 2013-05-06 DIAGNOSIS — I701 Atherosclerosis of renal artery: Secondary | ICD-10-CM | POA: Diagnosis not present

## 2013-05-06 DIAGNOSIS — I319 Disease of pericardium, unspecified: Secondary | ICD-10-CM | POA: Diagnosis not present

## 2013-05-06 DIAGNOSIS — I10 Essential (primary) hypertension: Secondary | ICD-10-CM | POA: Diagnosis not present

## 2013-05-06 MED ORDER — POTASSIUM CHLORIDE ER 10 MEQ PO TBCR: EXTENDED_RELEASE_TABLET | ORAL | Status: DC

## 2013-05-06 NOTE — Patient Instructions (Addendum)
Your physician has requested that you have a renal artery duplex in [facility] 2015. During this test, an ultrasound is used to evaluate blood flow to the kidneys. Allow one hour for this exam. Do not eat after midnight the day before and avoid carbonated beverages. Take your medications as you usually do.  Your physician wants you to follow-up in: MARCH 2015 with Dr Excell Seltzer. You will receive a reminder letter in the mail two months in advance. If you don't receive a letter, please call our office to schedule the follow-up appointment.  Your physician has requested that you have an echocardiogram in July 2015. Echocardiography is a painless test that uses sound waves to create images of your heart. It provides your doctor with information about the size and shape of your heart and how well your heart's chambers and valves are working. This procedure takes approximately one hour. There are no restrictions for this procedure.  Your physician recommends that you continue on your current medications as directed. Please refer to the Current Medication list given to you today.

## 2013-05-06 NOTE — Progress Notes (Signed)
HPI:  79 year old gentleman presenting for followup evaluation. He is followed for bilateral renal artery stenosis with history of left renal artery stenting. He also has had an idiopathic pericardial effusion. A serologic evaluation demonstrated no clear cause. His last echo from July 2014 showed normal LV function with a free-flowing moderate pericardial effusion without echo evidence of tamponade.   The patient is doing well without complaints of edema, chest pain, dyspnea, orthopnea, PND, or shortness of breath. He has no complaints today. Has been compliant with his medications.   Outpatient Encounter Prescriptions as of 05/06/2013  Medication Sig Dispense Refill  . amLODipine (NORVASC) 10 MG tablet Take 10 mg by mouth daily.      [name] aspirin EC 81 MG tablet Take 81 mg by mouth daily.      . carvedilol (COREG) 12.5 MG tablet Take 12.5 mg by mouth 2 (two) times daily with a meal.      . Cholecalciferol (VITAMIN D3) 2000 UNITS TABS Take 2,000 mg by mouth daily.       [name] ezetimibe (ZETIA) 10 MG tablet Take 10 mg by mouth daily.      . fenofibrate (TRICOR) 145 MG tablet Take 145 mg by mouth daily.      . furosemide (LASIX) 20 MG tablet Take 20 mg by mouth daily. Will stop 4-8 for procedure       . KLOR-CON 10 10 MEQ tablet TAKE 1 TABLET (10 MEQ TOTAL) BY MOUTH DAILY.  30 tablet  0  . rosuvastatin (CRESTOR) 40 MG tablet Take 40 mg by mouth daily.      [name] TAMSULOSIN HCL PO Take 0.5 mg by mouth daily.      . [DISCONTINUED] KLOR-CON M10 10 MEQ tablet TAKE 1 TABLET (10 MEQ TOTAL) BY MOUTH DAILY.  30 tablet  6  . [DISCONTINUED] KLOR-CON M10 10 MEQ tablet TAKE 1 TABLET (10 MEQ TOTAL) BY MOUTH DAILY.  30 tablet  3   No facility-administered encounter medications on file as of 05/06/2013.    No Known Allergies  Past Medical History  Diagnosis Date  . HTN (hypertension)   . High cholesterol   . Dysrhythmia     "fast one time"  . Type II diabetes mellitus   . Hemorrhoids   . Blood transfusion       S/P "lost blood due to hemorrhoids"  . Kidney disease, chronic, stage III (moderate, EGFR 30-59 ml/min)   . Atherosclerotic renal artery stenosis, bilateral     Bilateral renal angiography 11/27/11, Right 70% prox, Left 90% prox s/p LRA PTA/Stenting - 6.0x67mm ;  12/12/2011 Renal Duplex: Patent LRA stent & > 60% RRA ost stenosis  . AAA (abdominal aortic aneurysm)     Infrarenal AAA - maximal diameter of 3.5 cm   . Pericardial effusion     a. incidentally noted on renal u/s 11/2011;  b.  12/16/2011 Echo:  EF 55-60%, Gr1 DD, Mild MR, PASP 32, Mod circumferential peric effusion w/o hemodynamic compromise. Density in pericardial space - consider MRI    ROS: Negative except as per HPI  BP 124/67  Pulse 67  Ht 5\' 9"  (1.753 m)  Wt 138 lb (62.596 kg)  BMI 20.37 kg/m2  SpO2 97%  PHYSICAL EXAM: Pt is alert and oriented, pleasant elderly male in NAD HEENT: normal Neck: JVP - normal, carotids 2+= without bruits Lungs: CTA bilaterally CV: RRR without murmur or gallop Abd: soft, NT, Positive BS, no hepatomegaly Ext: no C/C/E, distal pulses intact and equal  Skin: warm/dry no rash  EKG:  Normal sinus rhythm with right bundle branch block, heart rate 67 beats per minute.  2-D echocardiogram 02/24/2013: Study Conclusions  - Left ventricle: The cavity size was normal. Wall thickness was normal. Systolic function was vigorous. The estimated ejection fraction was in the range of 65% to 70%. Wall motion was normal; there were no regional wall motion abnormalities. Doppler parameters are consistent with abnormal left ventricular relaxation (grade 1 diastolic dysfunction). - Mitral valve: Calcified annulus. Mild regurgitation. - Pulmonary arteries: PA peak pressure: 31mm Hg (S). - Pericardium, extracardiac: A moderate, free-flowing pericardial effusion was identified. The fluid had no internal echoes.There was no evidence of hemodynamic compromise. Impressions:  - When compared to prior echo  of August 2013, no significant changein size of pericardial effusion. No evidence of tamponade.  Renal artery duplex: March 2014: Patency of the left renal artery stent, greater than 60% right renal artery stenosis.  ASSESSMENT AND PLAN: 1. Renal atherosclerosis: last duplex from March 2014 reviewed demonstrating stent patency and severe right renal artery stenosis. He has been clinically stable. Plan repeat duplex at a one year interval. Continue current medical therapy. Renal function followed by Dr Deterding.  2. Pericardial effusion: asymptomatic and longstanding. No evidence of hemodynamic compromise. Repeat echo next year.  3. HTN - controlled.  Tonny Bollman 05/14/2013 5:32 AM

## 2013-05-18 DIAGNOSIS — E213 Hyperparathyroidism, unspecified: Secondary | ICD-10-CM | POA: Diagnosis not present

## 2013-05-18 DIAGNOSIS — N183 Chronic kidney disease, stage 3 unspecified: Secondary | ICD-10-CM | POA: Diagnosis not present

## 2013-05-18 DIAGNOSIS — I129 Hypertensive chronic kidney disease with stage 1 through stage 4 chronic kidney disease, or unspecified chronic kidney disease: Secondary | ICD-10-CM | POA: Diagnosis not present

## 2013-05-18 DIAGNOSIS — E119 Type 2 diabetes mellitus without complications: Secondary | ICD-10-CM | POA: Diagnosis not present

## 2013-05-18 DIAGNOSIS — D649 Anemia, unspecified: Secondary | ICD-10-CM | POA: Diagnosis not present

## 2013-05-18 DIAGNOSIS — I739 Peripheral vascular disease, unspecified: Secondary | ICD-10-CM | POA: Diagnosis not present

## 2013-06-08 DIAGNOSIS — Z23 Encounter for immunization: Secondary | ICD-10-CM | POA: Diagnosis not present

## 2013-06-24 ENCOUNTER — Other Ambulatory Visit: Payer: Self-pay

## 2013-07-06 ENCOUNTER — Encounter (HOSPITAL_COMMUNITY): Payer: Self-pay | Admitting: Cardiovascular Disease

## 2013-09-26 ENCOUNTER — Encounter (HOSPITAL_COMMUNITY): Payer: Self-pay | Admitting: Cardiovascular Disease

## 2013-09-27 ENCOUNTER — Other Ambulatory Visit: Payer: Self-pay

## 2013-09-27 DIAGNOSIS — I701 Atherosclerosis of renal artery: Secondary | ICD-10-CM

## 2013-09-27 DIAGNOSIS — I313 Pericardial effusion (noninflammatory): Secondary | ICD-10-CM

## 2013-09-27 DIAGNOSIS — I3139 Other pericardial effusion (noninflammatory): Secondary | ICD-10-CM

## 2013-09-27 DIAGNOSIS — I10 Essential (primary) hypertension: Secondary | ICD-10-CM

## 2013-09-27 MED ORDER — POTASSIUM CHLORIDE ER 10 MEQ PO TBCR: EXTENDED_RELEASE_TABLET | ORAL | Status: DC

## 2013-10-02 ENCOUNTER — Encounter (HOSPITAL_COMMUNITY): Payer: Self-pay | Admitting: Cardiovascular Disease

## 2013-10-26 DIAGNOSIS — N183 Chronic kidney disease, stage 3 unspecified: Secondary | ICD-10-CM | POA: Diagnosis not present

## 2013-10-26 DIAGNOSIS — I129 Hypertensive chronic kidney disease with stage 1 through stage 4 chronic kidney disease, or unspecified chronic kidney disease: Secondary | ICD-10-CM | POA: Diagnosis not present

## 2013-10-26 DIAGNOSIS — N2581 Secondary hyperparathyroidism of renal origin: Secondary | ICD-10-CM | POA: Diagnosis not present

## 2013-10-26 DIAGNOSIS — D631 Anemia in chronic kidney disease: Secondary | ICD-10-CM | POA: Diagnosis not present

## 2013-10-26 DIAGNOSIS — N039 Chronic nephritic syndrome with unspecified morphologic changes: Secondary | ICD-10-CM | POA: Diagnosis not present

## 2013-10-28 ENCOUNTER — Encounter (HOSPITAL_COMMUNITY): Payer: MEDICARE

## 2013-10-28 ENCOUNTER — Ambulatory Visit: Payer: MEDICARE | Admitting: Cardiovascular Disease

## 2013-11-04 ENCOUNTER — Other Ambulatory Visit: Payer: Self-pay | Admitting: Nurse Practitioner

## 2013-11-04 DIAGNOSIS — E119 Type 2 diabetes mellitus without complications: Secondary | ICD-10-CM | POA: Diagnosis not present

## 2013-11-04 DIAGNOSIS — H02139 Senile ectropion of unspecified eye, unspecified eyelid: Secondary | ICD-10-CM | POA: Diagnosis not present

## 2013-11-04 DIAGNOSIS — I319 Disease of pericardium, unspecified: Secondary | ICD-10-CM

## 2013-11-09 ENCOUNTER — Encounter (HOSPITAL_COMMUNITY): Payer: Self-pay | Admitting: Cardiovascular Disease

## 2013-11-11 ENCOUNTER — Encounter (HOSPITAL_COMMUNITY): Payer: MEDICARE

## 2013-11-16 ENCOUNTER — Ambulatory Visit: Payer: MEDICARE | Admitting: Cardiovascular Disease

## 2013-11-16 ENCOUNTER — Encounter (HOSPITAL_COMMUNITY): Payer: MEDICARE

## 2013-12-01 ENCOUNTER — Ambulatory Visit (HOSPITAL_COMMUNITY): Payer: MEDICARE | Attending: Cardiovascular Disease | Admitting: *Deleted

## 2013-12-01 DIAGNOSIS — N183 Chronic kidney disease, stage 3 unspecified: Secondary | ICD-10-CM | POA: Insufficient documentation

## 2013-12-01 DIAGNOSIS — I10 Essential (primary) hypertension: Secondary | ICD-10-CM

## 2013-12-01 DIAGNOSIS — I714 Abdominal aortic aneurysm, without rupture, unspecified: Secondary | ICD-10-CM | POA: Diagnosis not present

## 2013-12-01 DIAGNOSIS — I129 Hypertensive chronic kidney disease with stage 1 through stage 4 chronic kidney disease, or unspecified chronic kidney disease: Secondary | ICD-10-CM | POA: Diagnosis not present

## 2013-12-01 DIAGNOSIS — E119 Type 2 diabetes mellitus without complications: Secondary | ICD-10-CM | POA: Diagnosis not present

## 2013-12-01 DIAGNOSIS — I701 Atherosclerosis of renal artery: Secondary | ICD-10-CM | POA: Diagnosis not present

## 2013-12-01 DIAGNOSIS — F17200 Nicotine dependence, unspecified, uncomplicated: Secondary | ICD-10-CM | POA: Insufficient documentation

## 2013-12-01 NOTE — Progress Notes (Signed)
Renal duplex complete.  

## 2013-12-08 ENCOUNTER — Ambulatory Visit (INDEPENDENT_AMBULATORY_CARE_PROVIDER_SITE_OTHER): Payer: MEDICARE | Admitting: Cardiovascular Disease

## 2013-12-08 ENCOUNTER — Encounter (HOSPITAL_COMMUNITY): Payer: MEDICARE

## 2013-12-08 ENCOUNTER — Encounter: Payer: Self-pay | Admitting: Cardiovascular Disease

## 2013-12-08 VITALS — BP 134/66 | HR 62 | Ht 69.0 in | Wt 141.8 lb

## 2013-12-08 DIAGNOSIS — N189 Chronic kidney disease, unspecified: Secondary | ICD-10-CM

## 2013-12-08 DIAGNOSIS — I10 Essential (primary) hypertension: Secondary | ICD-10-CM

## 2013-12-08 DIAGNOSIS — I313 Pericardial effusion (noninflammatory): Secondary | ICD-10-CM

## 2013-12-08 DIAGNOSIS — I701 Atherosclerosis of renal artery: Secondary | ICD-10-CM

## 2013-12-08 DIAGNOSIS — I319 Disease of pericardium, unspecified: Secondary | ICD-10-CM

## 2013-12-08 DIAGNOSIS — I3139 Other pericardial effusion (noninflammatory): Secondary | ICD-10-CM

## 2013-12-08 NOTE — Progress Notes (Signed)
HPI:  80 year old gentleman presenting for followup evaluation. He is followed for bilateral renal artery stenosis with history of left renal artery stenting. He also has had an idiopathic pericardial effusion. A serologic evaluation demonstrated no clear cause. His last echo from July 2014 showed normal LV function with a free-flowing moderate pericardial effusion without echo evidence of tamponade.   The patient is doing well at present. He denies chest pain, chest pressure, palpitations, shortness of breath, lightheadedness, or syncope. He has occasional swelling of his right leg but this is greatly improved from previous. He's been compliant with his medications.   Outpatient Encounter Prescriptions as of 12/08/2013  Medication Sig  . amLODipine (NORVASC) 10 MG tablet Take 10 mg by mouth daily.  [name] aspirin EC 81 MG tablet Take 81 mg by mouth daily.  . carvedilol (COREG) 12.5 MG tablet Take 12.5 mg by mouth 2 (two) times daily with a meal.  . Cholecalciferol (VITAMIN D3) 2000 UNITS TABS Take 2,000 mg by mouth daily.   [name] ezetimibe (ZETIA) 10 MG tablet Take 10 mg by mouth daily.  . fenofibrate (TRICOR) 145 MG tablet Take 145 mg by mouth daily.  . furosemide (LASIX) 20 MG tablet Take 20 mg by mouth daily.   . rosuvastatin (CRESTOR) 40 MG tablet Take 40 mg by mouth daily.  [name] TAMSULOSIN HCL PO Take 0.5 mg by mouth daily.  . [DISCONTINUED] potassium chloride (KLOR-CON 10) 10 MEQ tablet TAKE 1 TABLET (10 MEQ TOTAL) BY MOUTH DAILY.    No Known Allergies  Past Medical History  Diagnosis Date  . HTN (hypertension)   . High cholesterol   . Dysrhythmia     "fast one time"  . Type II diabetes mellitus   . Hemorrhoids   . Blood transfusion     S/P "lost blood due to hemorrhoids"  . Kidney disease, chronic, stage III (moderate, EGFR 30-59 ml/min)   . Atherosclerotic renal artery stenosis, bilateral     Bilateral renal angiography 11/27/11, Right 70% prox, Left 90% prox s/p LRA PTA/Stenting -  6.0x22mm ;  12/12/2011 Renal Duplex: Patent LRA stent & > 60% RRA ost stenosis  . AAA (abdominal aortic aneurysm)     Infrarenal AAA - maximal diameter of 3.5 cm   . Pericardial effusion     a. incidentally noted on renal u/s 11/2011;  b.  12/16/2011 Echo:  EF 55-60%, Gr1 DD, Mild MR, PASP 32, Mod circumferential peric effusion w/o hemodynamic compromise. Density in pericardial space - consider MRI    ROS: Negative except as per HPI  BP 134/66  Pulse 62  Ht $R'5\' 9"'Mp$  (1.753 m)  Wt 141 lb 12.8 oz (64.32 kg)  BMI 20.93 kg/m2  PHYSICAL EXAM: Pt is alert and oriented, NAD HEENT: normal Neck: JVP - normal, carotids 2+= without bruits Lungs: CTA bilaterally CV: RRR without murmur or gallop Abd: soft, NT, Positive BS, no hepatomegaly Ext: no C/C/E, distal pulses intact and equal Skin: warm/dry no rash  EKG:  Normal sinus rhythm 62 beats per minute, right bundle branch block.  2-D echocardiogram 02/24/2013: Study Conclusions  - Left ventricle: The cavity size was normal. Wall thickness was normal. Systolic function was vigorous. The estimated ejection fraction was in the range of 65% to 70%. Wall motion was normal; there were no regional wall motion abnormalities. Doppler parameters are consistent with abnormal left ventricular relaxation (grade 1 diastolic dysfunction). - Mitral valve: Calcified annulus. Mild regurgitation. - Pulmonary arteries: PA peak pressure: 44mm Hg (S). -  Pericardium, extracardiac: A moderate, free-flowing pericardial effusion was identified. The fluid had no internal echoes.There was no evidence of hemodynamic compromise. Impressions:  - When compared to prior echo of August 2013, no significant changein size of pericardial effusion. No evidence of tamponade.  ASSESSMENT AND PLAN: 1. Renal artery stenosis. The patient is status post left renal artery stenting approximately 2 years ago. He had a renal duplex evaluation 12/01/2013. This demonstrated wide  patency of his stent site. There is residual severe right renal artery stenosis at the ostium with a peak velocity of 426 cm/s. The right kidney length is 9.7 cm and the left kidney length is 11.2 cm. He remains stable with good blood pressure control. He has recently been seen by Dr. Jimmy Footman and tells me that his renal indices are stable. Will continue with yearly duplex ultrasonography and clinical followup.  2. Idiopathic pericardial effusion, chronic. No signs of hemodynamic sequelae. Recommend followup echocardiogram at yearly intervals (next in July 2015).  3. Hypertension. Blood pressure well controlled.  For followup I will see the patient back in one year.  Sherren Mocha 12/08/2013 2:33 PM

## 2013-12-08 NOTE — Patient Instructions (Addendum)
Your physician has recommended you make the following change in your medication: STOP Potassium supplement, continue to eat foods that are rich in potassium  Your physician recommends that you return for lab work in: [place] (BMP)--the pt will have this drawn with his PCP  Your physician wants you to follow-up in: 1 YEAR with Dr Burt Knack.  You will receive a reminder letter in the mail two months in advance. If you don't receive a letter, please call our office to schedule the follow-up appointment.  Your physician has requested that you have a renal artery duplex in 1 YEAR. During this test, an ultrasound is used to evaluate blood flow to the kidneys. Allow one hour for this exam. Do not eat after midnight the day before and avoid carbonated beverages. Take your medications as you usually do.

## 2014-01-13 DIAGNOSIS — E1129 Type 2 diabetes mellitus with other diabetic kidney complication: Secondary | ICD-10-CM | POA: Diagnosis not present

## 2014-01-13 DIAGNOSIS — N400 Enlarged prostate without lower urinary tract symptoms: Secondary | ICD-10-CM | POA: Diagnosis not present

## 2014-01-13 DIAGNOSIS — I10 Essential (primary) hypertension: Secondary | ICD-10-CM | POA: Diagnosis not present

## 2014-01-13 DIAGNOSIS — Z23 Encounter for immunization: Secondary | ICD-10-CM | POA: Diagnosis not present

## 2014-01-13 DIAGNOSIS — Z0000 Encounter for general adult medical examination without abnormal findings: Secondary | ICD-10-CM | POA: Diagnosis not present

## 2014-01-13 DIAGNOSIS — E782 Mixed hyperlipidemia: Secondary | ICD-10-CM | POA: Diagnosis not present

## 2014-02-14 DIAGNOSIS — H6120 Impacted cerumen, unspecified ear: Secondary | ICD-10-CM | POA: Diagnosis not present

## 2014-03-04 ENCOUNTER — Ambulatory Visit (HOSPITAL_COMMUNITY): Payer: MEDICARE | Attending: Internal Medicine | Admitting: Radiology

## 2014-03-04 DIAGNOSIS — I10 Essential (primary) hypertension: Secondary | ICD-10-CM | POA: Diagnosis not present

## 2014-03-04 DIAGNOSIS — I517 Cardiomegaly: Secondary | ICD-10-CM | POA: Diagnosis not present

## 2014-03-04 DIAGNOSIS — I319 Disease of pericardium, unspecified: Secondary | ICD-10-CM | POA: Diagnosis not present

## 2014-03-04 DIAGNOSIS — I079 Rheumatic tricuspid valve disease, unspecified: Secondary | ICD-10-CM | POA: Diagnosis not present

## 2014-03-04 DIAGNOSIS — E119 Type 2 diabetes mellitus without complications: Secondary | ICD-10-CM | POA: Insufficient documentation

## 2014-03-04 DIAGNOSIS — I313 Pericardial effusion (noninflammatory): Secondary | ICD-10-CM

## 2014-03-04 DIAGNOSIS — I3139 Other pericardial effusion (noninflammatory): Secondary | ICD-10-CM

## 2014-03-04 DIAGNOSIS — I059 Rheumatic mitral valve disease, unspecified: Secondary | ICD-10-CM | POA: Insufficient documentation

## 2014-03-04 DIAGNOSIS — I719 Aortic aneurysm of unspecified site, without rupture: Secondary | ICD-10-CM | POA: Insufficient documentation

## 2014-03-04 NOTE — Progress Notes (Signed)
Echocardiogram performed.  

## 2014-03-08 ENCOUNTER — Telehealth: Payer: Self-pay | Admitting: Cardiovascular Disease

## 2014-03-08 NOTE — Telephone Encounter (Signed)
New message ° ° ° ° ° °Want echo results °

## 2014-03-08 NOTE — Telephone Encounter (Signed)
I spoke with pt & he is aware of his ECHO results Horton Chin RN

## 2014-04-15 ENCOUNTER — Encounter (HOSPITAL_COMMUNITY): Payer: Self-pay | Admitting: Cardiovascular Disease

## 2014-05-03 DIAGNOSIS — E1129 Type 2 diabetes mellitus with other diabetic kidney complication: Secondary | ICD-10-CM | POA: Diagnosis not present

## 2014-05-03 DIAGNOSIS — N183 Chronic kidney disease, stage 3 unspecified: Secondary | ICD-10-CM | POA: Diagnosis not present

## 2014-05-03 DIAGNOSIS — I129 Hypertensive chronic kidney disease with stage 1 through stage 4 chronic kidney disease, or unspecified chronic kidney disease: Secondary | ICD-10-CM | POA: Diagnosis not present

## 2014-05-03 DIAGNOSIS — E785 Hyperlipidemia, unspecified: Secondary | ICD-10-CM | POA: Diagnosis not present

## 2014-05-10 DIAGNOSIS — Z23 Encounter for immunization: Secondary | ICD-10-CM | POA: Diagnosis not present

## 2014-05-11 ENCOUNTER — Encounter (HOSPITAL_COMMUNITY): Payer: Self-pay | Admitting: Cardiovascular Disease

## 2014-07-28 ENCOUNTER — Encounter (HOSPITAL_COMMUNITY): Payer: Self-pay | Admitting: Cardiovascular Disease

## 2014-08-23 ENCOUNTER — Encounter (HOSPITAL_COMMUNITY): Payer: Self-pay | Admitting: Cardiovascular Disease

## 2014-09-19 ENCOUNTER — Encounter (HOSPITAL_COMMUNITY): Payer: Self-pay | Admitting: Cardiovascular Disease

## 2014-09-21 ENCOUNTER — Other Ambulatory Visit: Payer: Self-pay | Admitting: Radiology

## 2014-09-21 DIAGNOSIS — I701 Atherosclerosis of renal artery: Secondary | ICD-10-CM

## 2014-10-20 DIAGNOSIS — H35033 Hypertensive retinopathy, bilateral: Secondary | ICD-10-CM | POA: Diagnosis not present

## 2014-10-20 DIAGNOSIS — E11329 Type 2 diabetes mellitus with mild nonproliferative diabetic retinopathy without macular edema: Secondary | ICD-10-CM | POA: Diagnosis not present

## 2014-11-30 ENCOUNTER — Ambulatory Visit (INDEPENDENT_AMBULATORY_CARE_PROVIDER_SITE_OTHER): Payer: MEDICARE | Admitting: Cardiovascular Disease

## 2014-11-30 ENCOUNTER — Encounter: Payer: Self-pay | Admitting: Cardiovascular Disease

## 2014-11-30 ENCOUNTER — Ambulatory Visit (HOSPITAL_COMMUNITY): Payer: MEDICARE | Attending: Cardiology | Admitting: Cardiology

## 2014-11-30 VITALS — BP 180/88 | HR 53 | Ht 69.0 in | Wt 134.4 lb

## 2014-11-30 DIAGNOSIS — I10 Essential (primary) hypertension: Secondary | ICD-10-CM | POA: Insufficient documentation

## 2014-11-30 DIAGNOSIS — I714 Abdominal aortic aneurysm, without rupture, unspecified: Secondary | ICD-10-CM

## 2014-11-30 DIAGNOSIS — I701 Atherosclerosis of renal artery: Secondary | ICD-10-CM

## 2014-11-30 DIAGNOSIS — Z87891 Personal history of nicotine dependence: Secondary | ICD-10-CM | POA: Insufficient documentation

## 2014-11-30 DIAGNOSIS — I313 Pericardial effusion (noninflammatory): Secondary | ICD-10-CM

## 2014-11-30 DIAGNOSIS — E119 Type 2 diabetes mellitus without complications: Secondary | ICD-10-CM | POA: Insufficient documentation

## 2014-11-30 DIAGNOSIS — I319 Disease of pericardium, unspecified: Secondary | ICD-10-CM | POA: Diagnosis not present

## 2014-11-30 DIAGNOSIS — I3139 Other pericardial effusion (noninflammatory): Secondary | ICD-10-CM

## 2014-11-30 NOTE — Patient Instructions (Signed)
Medication Instructions:  Your physician recommends that you continue on your current medications as directed. Please refer to the Current Medication list given to you today.  Labwork: No new orders.  Testing/Procedures: Your physician has requested that you have an echocardiogram in July 2016. Echocardiography is a painless test that uses sound waves to create images of your heart. It provides your doctor with information about the size and shape of your heart and how well your heart's chambers and valves are working. This procedure takes approximately one hour. There are no restrictions for this procedure.  Your physician has requested that you have an echocardiogram in July 2017. Echocardiography is a painless test that uses sound waves to create images of your heart. It provides your doctor with information about the size and shape of your heart and how well your heart's chambers and valves are working. This procedure takes approximately one hour. There are no restrictions for this procedure.  Your physician has requested that you have a renal artery duplex in July 2017. During this test, an ultrasound is used to evaluate blood flow to the kidneys. Allow one hour for this exam. Do not eat after midnight the day before and avoid carbonated beverages. Take your medications as you usually do.  Follow-Up: Your physician wants you to follow-up in: July 2017 with Dr Burt Knack.  You will receive a reminder letter in the mail two months in advance. If you don't receive a letter, please call our office to schedule the follow-up appointment.   Any Other Special Instructions Will Be Listed Below (If Applicable).  Your physician has requested that you regularly monitor and record your blood pressure readings at home. Please use the same machine at the same time of day to check your readings and record them to bring to your follow-up visit with Nephrology.

## 2014-11-30 NOTE — Progress Notes (Signed)
Cardiology Office Note   Date:  11/30/2014   ID:  Chris Peterson, DOB [birth year], MRN [id number]  PCP:   Duane Lope, MD  Cardiologist:  Tonny Bollman, MD    Chief Complaint  Patient presents with  . Hypertension     History of Present Illness: Chris Peterson is a 81 y.o. male who presents for follow-up of renal artery stenosis and idiopathic pericardial effusion.  The patient underwent left renal artery stenting and has had continued patency of that stent. He has been noted to have severe right renal artery stenosis. This is been treated medically in the setting of well controlled blood pressure and stable renal function. He also was incidentally noted to have a pericardial effusion and this has been followed by serial echocardiography with no change over time and no evidence of hemodynamic sequelae.  BP is elevated today, but he hasn't taken medications. States BP has consistently been in low-normal range. At [facility] office he tells me BP was 110/70 and this is where it usually runs. Since I have seen him last, amlodipine has been discontinued. He has an appointment with Dr Darrick Penna 4/26.  He is physically active with work around the house and yard. He walks on the treadmill every day.  Past Medical History  Diagnosis Date  . HTN (hypertension)   . High cholesterol   . Dysrhythmia     "fast one time"  . Type II diabetes mellitus   . Hemorrhoids   . Blood transfusion     S/P "lost blood due to hemorrhoids"  . Kidney disease, chronic, stage III (moderate, EGFR 30-59 ml/min)   . Atherosclerotic renal artery stenosis, bilateral     Bilateral renal angiography 11/27/11, Right 70% prox, Left 90% prox s/p LRA PTA/Stenting - 6.0x29mm ;  12/12/2011 Renal Duplex: Patent LRA stent & > 60% RRA ost stenosis  . AAA (abdominal aortic aneurysm)     Infrarenal AAA - maximal diameter of 3.5 cm   . Pericardial effusion     a. incidentally noted on renal u/s 11/2011;  b.  12/16/2011  Echo:  EF 55-60%, Gr1 DD, Mild MR, PASP 32, Mod circumferential peric effusion w/o hemodynamic compromise. Density in pericardial space - consider MRI    Past Surgical History  Procedure Laterality Date  . Renal artery stent  11/27/11  . Cataract extraction w/ intraocular lens  implant, bilateral  1990's  . Nasal septum surgery  1980's  . Renal angiogram Bilateral 11/27/2011    Procedure: RENAL ANGIOGRAM;  Surgeon: Tonny Bollman, MD;  Location: [facility] CATH LAB;  Service: Cardiovascular;  Laterality: Bilateral;  possible PTA/stent  . Abdominal angiogram N/A 11/27/2011    Procedure: ABDOMINAL ANGIOGRAM;  Surgeon: Tonny Bollman, MD;  Location: [facility] CATH LAB;  Service: Cardiovascular;  Laterality: N/A;    Current Outpatient Prescriptions  Medication Sig Dispense Refill  . carvedilol (COREG) 12.5 MG tablet Take 12.5 mg by mouth 2 (two) times daily with a meal.    . furosemide (LASIX) 20 MG tablet Take 20 mg by mouth daily.    . tamsulosin (FLOMAX) 0.4 MG CAPS capsule Take 0.4 mg by mouth daily after breakfast.     . aspirin EC 81 MG tablet Take 81 mg by mouth daily.    . Cholecalciferol (VITAMIN D3) 2000 UNITS TABS Take 2,000 mg by mouth daily.     . fenofibrate (TRICOR) 145 MG tablet Take 145 mg by mouth daily.    . rosuvastatin (CRESTOR) 40 MG tablet  Take 40 mg by mouth daily.     No current facility-administered medications for this visit.    Allergies:   Review of patient's allergies indicates no known allergies.   Social History:  The patient  reports that he quit smoking about 18 years ago. His smoking use included Pipe and Cigarettes. He quit after 65 years of use. He has never used smokeless tobacco. He reports that he drinks about 8.4 oz of alcohol per week. He reports that he does not use illicit drugs.   Family History:  The patient's family history includes Hypertension in his brother and mother.   ROS:  Please see the history of present illness. All other systems are reviewed and  negative.   PHYSICAL EXAM: VS:  BP 180/88 mmHg  Pulse 53  Ht $R'5\' 9"'wN$  (1.753 m)  Wt 134 lb 6.4 oz (60.963 kg)  BMI 19.84 kg/m2 , BMI Body mass index is 19.84 kg/(m^2).   BP on my recheck is 180/80  GEN: Well nourished, well developed, pleasant elderly male in no acute distress HEENT: normal Neck: no JVD, no masses. No carotid bruits Cardiac: RRR without murmur or gallop                Respiratory:  clear to auscultation bilaterally, normal work of breathing GI: soft, nontender, nondistended, + BS MS: no deformity or atrophy Ext: no pretibial edema, pedal pulses 2+= bilaterally Skin: warm and dry, no rash Neuro:  Strength and sensation are intact Psych: euthymic mood, full affect  EKG:  EKG is ordered today. The ekg ordered today shows Sinus bradycardia 53 bpm, RBBB, no change from previous tracings  Recent Labs: No results found for requested labs within last 365 days.   Lipid Panel  No results found for: CHOL, TRIG, HDL, CHOLHDL, VLDL, LDLCALC, LDLDIRECT    Wt Readings from Last 3 Encounters:  11/30/14 134 lb 6.4 oz (60.963 kg)  12/08/13 141 lb 12.8 oz (64.32 kg)  05/06/13 138 lb (62.596 kg)     Cardiac Studies Reviewed: 2D Echo 03/04/2014: Study Conclusions - Left ventricle: The cavity size was normal. Systolic function was normal. The estimated ejection fraction was in the range of 60% to 65%. Doppler parameters are consistent with abnormal left ventricular relaxation (grade 1 diastolic dysfunction). - Mitral valve: There was mild regurgitation. - Left atrium: The atrium was moderately dilated. - Pericardium, extracardiac: Moderate pericardial effusion surrounds heart. Measures 12 mm in maximal dimension. There is some consolidation in the space. No evidence for hemodynamic compromise by echo criteria.  Renal arterial duplex 12/01/2013: Left renal artery stent is widely patent. There is severe right renal artery stenosis. The right kidney length is 9.7  cm. The abdominal aorta is aneurysmal with a maximum diameter of 3.8 cm.  ASSESSMENT AND PLAN: 1.  Renal artery stenosis: The patient's renal arterial duplex from this morning was reviewed. His left renal artery stent remains patent. The left kidney size is 11.4 cm. The right renal artery has severe ostial and proximal stenosis with a peak systolic velocity of 947 cm/s. The renal aortic ratio is 9.9. The patient's right kidney size is stable at 9.57 m in length. He reports well controlled blood pressure at home. Blood pressure is elevated today in our office. On my check, there is an auscultatory gap between 170 and 135 mmHg. I asked him to keep a log of BP's and bring in to Dr Deterding when he sees him in 2 weeks. No medication changes were made.  If BP is truly controlled and renal function stable, would favor ongoing observation/medical therapy. If change in renal fxn/BP may consider PTA/stenting of the right renal artery. Hopefully with a widely patent left renal artery he will continue to do well clinically.  2. Idiopathic pericardial effusion: repeat echo in July for one year follow-up. Next year I will see him back in July so that his renal duplex, echo, and office visit can be coordinated.  3. Hypertension: see discussion above. May need to restart amlodipine at lower dose if home BP's elevated. I instructed him on method of checking BP at home. He sees Dr Jimmy Footman in 2 weeks.  Current medicines are reviewed with the patient today.  The patient does not have concerns regarding medicines.  The following changes have been made:  no change  Labs/ tests ordered today include:   Orders Placed This Encounter  Procedures  . 2D Echocardiogram with contrast    Disposition:   FU July 2017 with an echo and renal arterial duplex before the visit.  Signed, Sherren Mocha, MD  11/30/2014 2:06 PM    [facility] [address], [place], [place]  [address] Phone: [phone];  Fax: [phone]

## 2014-11-30 NOTE — Progress Notes (Signed)
Renal artery duplex performed  

## 2014-12-12 DIAGNOSIS — D631 Anemia in chronic kidney disease: Secondary | ICD-10-CM | POA: Diagnosis not present

## 2014-12-12 DIAGNOSIS — E1129 Type 2 diabetes mellitus with other diabetic kidney complication: Secondary | ICD-10-CM | POA: Diagnosis not present

## 2014-12-12 DIAGNOSIS — N183 Chronic kidney disease, stage 3 (moderate): Secondary | ICD-10-CM | POA: Diagnosis not present

## 2014-12-12 DIAGNOSIS — N189 Chronic kidney disease, unspecified: Secondary | ICD-10-CM | POA: Diagnosis not present

## 2014-12-12 DIAGNOSIS — I129 Hypertensive chronic kidney disease with stage 1 through stage 4 chronic kidney disease, or unspecified chronic kidney disease: Secondary | ICD-10-CM | POA: Diagnosis not present

## 2014-12-19 ENCOUNTER — Emergency Department (HOSPITAL_COMMUNITY)
Admission: EM | Admit: 2014-12-19 | Discharge: 2014-12-19 | Disposition: A | Discharge status: Home / Self Care | Payer: MEDICARE | Attending: Emergency Medicine | Admitting: Emergency Medicine

## 2014-12-19 ENCOUNTER — Encounter (HOSPITAL_COMMUNITY): Payer: Self-pay | Admitting: Emergency Medicine

## 2014-12-19 ENCOUNTER — Emergency Department (HOSPITAL_COMMUNITY): Payer: MEDICARE

## 2014-12-19 DIAGNOSIS — Z79899 Other long term (current) drug therapy: Secondary | ICD-10-CM | POA: Insufficient documentation

## 2014-12-19 DIAGNOSIS — Z7982 Long term (current) use of aspirin: Secondary | ICD-10-CM | POA: Insufficient documentation

## 2014-12-19 DIAGNOSIS — E119 Type 2 diabetes mellitus without complications: Secondary | ICD-10-CM | POA: Insufficient documentation

## 2014-12-19 DIAGNOSIS — M5126 Other intervertebral disc displacement, lumbar region: Secondary | ICD-10-CM | POA: Diagnosis not present

## 2014-12-19 DIAGNOSIS — Z8719 Personal history of other diseases of the digestive system: Secondary | ICD-10-CM | POA: Diagnosis not present

## 2014-12-19 DIAGNOSIS — M5430 Sciatica, unspecified side: Secondary | ICD-10-CM | POA: Diagnosis not present

## 2014-12-19 DIAGNOSIS — E780 Pure hypercholesterolemia: Secondary | ICD-10-CM | POA: Insufficient documentation

## 2014-12-19 DIAGNOSIS — M4806 Spinal stenosis, lumbar region: Secondary | ICD-10-CM | POA: Diagnosis not present

## 2014-12-19 DIAGNOSIS — Z87891 Personal history of nicotine dependence: Secondary | ICD-10-CM | POA: Insufficient documentation

## 2014-12-19 DIAGNOSIS — I129 Hypertensive chronic kidney disease with stage 1 through stage 4 chronic kidney disease, or unspecified chronic kidney disease: Secondary | ICD-10-CM | POA: Diagnosis not present

## 2014-12-19 DIAGNOSIS — M5136 Other intervertebral disc degeneration, lumbar region: Secondary | ICD-10-CM | POA: Diagnosis not present

## 2014-12-19 DIAGNOSIS — M9973 Connective tissue and disc stenosis of intervertebral foramina of lumbar region: Secondary | ICD-10-CM | POA: Diagnosis not present

## 2014-12-19 DIAGNOSIS — N183 Chronic kidney disease, stage 3 (moderate): Secondary | ICD-10-CM | POA: Insufficient documentation

## 2014-12-19 DIAGNOSIS — M549 Dorsalgia, unspecified: Secondary | ICD-10-CM | POA: Diagnosis present

## 2014-12-19 LAB — CBC WITH DIFFERENTIAL/PLATELET
BASOS ABS: 0 10*3/uL (ref 0.0–0.1)
Basophils Relative: 0 % (ref 0–1)
EOS PCT: 1 % (ref 0–5)
Eosinophils Absolute: 0 10*3/uL (ref 0.0–0.7)
HEMATOCRIT: 40 % (ref 39.0–52.0)
Hemoglobin: 13 g/dL (ref 13.0–17.0)
LYMPHS PCT: 25 % (ref 12–46)
Lymphs Abs: 1.4 10*3/uL (ref 0.7–4.0)
MCH: 29.2 pg (ref 26.0–34.0)
MCHC: 32.5 g/dL (ref 30.0–36.0)
MCV: 89.9 fL (ref 78.0–100.0)
MONO ABS: 0.5 10*3/uL (ref 0.1–1.0)
Monocytes Relative: 10 % (ref 3–12)
Neutro Abs: 3.5 10*3/uL (ref 1.7–7.7)
Neutrophils Relative %: 64 % (ref 43–77)
Platelets: 131 10*3/uL — ABNORMAL LOW (ref 150–400)
RBC: 4.45 MIL/uL (ref 4.22–5.81)
RDW: 14.6 % (ref 11.5–15.5)
WBC: 5.5 10*3/uL (ref 4.0–10.5)

## 2014-12-19 LAB — BASIC METABOLIC PANEL
ANION GAP: 9 (ref 5–15)
BUN: 39 mg/dL — ABNORMAL HIGH (ref 6–20)
CO2: 27 mmol/L (ref 22–32)
CREATININE: 1.37 mg/dL — AB (ref 0.61–1.24)
Calcium: 9.5 mg/dL (ref 8.9–10.3)
Chloride: 106 mmol/L (ref 101–111)
GFR calc Af Amer: 54 mL/min — ABNORMAL LOW (ref >60)
GFR calc non Af Amer: 46 mL/min — ABNORMAL LOW (ref >60)
Glucose, Bld: 144 mg/dL — ABNORMAL HIGH (ref 70–99)
Potassium: 3.9 mmol/L (ref 3.5–5.1)
Sodium: 142 mmol/L (ref 135–145)

## 2014-12-19 LAB — URINALYSIS, ROUTINE W REFLEX MICROSCOPIC
Bilirubin Urine: NEGATIVE
Glucose, UA: NEGATIVE mg/dL
HGB URINE DIPSTICK: NEGATIVE
KETONES UR: NEGATIVE mg/dL
Leukocytes, UA: NEGATIVE
Nitrite: NEGATIVE
PH: 6.5 (ref 5.0–8.0)
Protein, ur: NEGATIVE mg/dL
Specific Gravity, Urine: 1.013 (ref 1.005–1.030)
Urobilinogen, UA: 0.2 mg/dL (ref 0.0–1.0)

## 2014-12-19 MED ORDER — PREDNISONE 20 MG PO TABS
20.0000 mg | ORAL_TABLET | Freq: Once | ORAL | Status: DC
Start: 2014-12-19 — End: 2014-12-19

## 2014-12-19 MED ORDER — DOCUSATE SODIUM 100 MG PO CAPS: 100.0000 mg | ORAL_CAPSULE | Freq: Every day | ORAL | Status: DC

## 2014-12-19 MED ORDER — PREDNISONE 10 MG PO TABS: 20.0000 mg | ORAL_TABLET | Freq: Every day | ORAL | Status: DC

## 2014-12-19 MED ORDER — ONDANSETRON HCL 4 MG/2ML IJ SOLN
4.0000 mg | Freq: Once | INTRAMUSCULAR | Status: AC
Start: 2014-12-19 — End: 2014-12-19
  Administered 2014-12-19: 4 mg via INTRAVENOUS
  Filled 2014-12-19: qty 2

## 2014-12-19 MED ORDER — OXYCODONE-ACETAMINOPHEN 5-325 MG PO TABS: 1.0000 | ORAL_TABLET | Freq: Once | ORAL | Status: DC

## 2014-12-19 MED ORDER — OXYCODONE-ACETAMINOPHEN 5-325 MG PO TABS
1.0000 | ORAL_TABLET | Freq: Four times a day (QID) | ORAL | Status: DC | PRN
Start: 2014-12-19 — End: 2016-03-21

## 2014-12-19 MED ORDER — GADOBENATE DIMEGLUMINE 529 MG/ML IV SOLN
15.0000 mL | Freq: Once | INTRAVENOUS | Status: AC | PRN
  Administered 2014-12-19: 12 mL via INTRAVENOUS

## 2014-12-19 MED ORDER — SODIUM CHLORIDE 0.9 % IV SOLN
INTRAVENOUS | Status: DC
  Administered 2014-12-19: 13:00:00 via INTRAVENOUS

## 2014-12-19 MED ORDER — OXYCODONE-ACETAMINOPHEN 5-325 MG PO TABS: 1.0000 | ORAL_TABLET | Freq: Four times a day (QID) | ORAL | Status: DC | PRN

## 2014-12-19 MED ORDER — MORPHINE SULFATE 2 MG/ML IJ SOLN
2.0000 mg | Freq: Once | INTRAMUSCULAR | Status: AC
  Administered 2014-12-19: 2 mg via INTRAVENOUS
  Filled 2014-12-19: qty 1

## 2014-12-19 NOTE — ED Notes (Signed)
Patient transported to MRI 

## 2014-12-19 NOTE — ED Notes (Signed)
Pt is alert X 4.  He is ambulatory and understands discharge instructions.

## 2014-12-19 NOTE — ED Notes (Signed)
Pt. Is unable to use the restroom at this time, but is aware that we need a urine specimen.  

## 2014-12-19 NOTE — ED Notes (Signed)
Pt c/o lower back pain that radiates down both legs x 2 weeks. Pt sts the pain became severe this morning, 10/10 pain. Pt taking OTC medications without relief. Pt sts that "for awhile now" he has had some trouble controlling his bowel and bladder and that he "attributes that to old age" but the back pain is making it worse. Pt denies injury. A&Ox4 and ambulatory.

## 2014-12-19 NOTE — ED Provider Notes (Signed)
CSN: [id number]     Arrival date & time 12/19/14  1109 History   First MD Initiated Contact with Patient 12/19/14 1159     Chief Complaint  Patient presents with  . Back Pain  . Sciatica  . Urinary Incontinence     (Consider location/radiation/quality/duration/timing/severity/associated sxs/prior Treatment) HPI The patient has had incrementally developing lower back pain over 2 weeks to a month. He has been managing with the pain. It has however been increasing and now radiates into his legs. He has become increasingly weak as well. Previously he could walk and get out of his chair however now he is having significant weakness was trying to go to a standing position and ambulate. He feels like his legs will give out and it takes extra assistance to get from a sitting to standing. He has also been having some problems with bowel or bladder incontinence. He reports that actually seemed to precede the significant worsening of his back pain. Prior to this episode he did not previously have problems with significant back problems. No surgical history. He has otherwise been well without fever or chills. He denies associated abdominal pain. Past Medical History  Diagnosis Date  . HTN (hypertension)   . High cholesterol   . Dysrhythmia     "fast one time"  . Type II diabetes mellitus   . Hemorrhoids   . Blood transfusion     S/P "lost blood due to hemorrhoids"  . Kidney disease, chronic, stage III (moderate, EGFR 30-59 ml/min)   . Atherosclerotic renal artery stenosis, bilateral     Bilateral renal angiography 11/27/11, Right 70% prox, Left 90% prox s/p LRA PTA/Stenting - 6.0x60mm ;  12/12/2011 Renal Duplex: Patent LRA stent & > 60% RRA ost stenosis  . AAA (abdominal aortic aneurysm)     Infrarenal AAA - maximal diameter of 3.5 cm   . Pericardial effusion     a. incidentally noted on renal u/s 11/2011;  b.  12/16/2011 Echo:  EF 55-60%, Gr1 DD, Mild MR, PASP 32, Mod circumferential peric effusion w/o  hemodynamic compromise. Density in pericardial space - consider MRI   Past Surgical History  Procedure Laterality Date  . Renal artery stent  11/27/11  . Cataract extraction w/ intraocular lens  implant, bilateral  1990's  . Nasal septum surgery  1980's  . Renal angiogram Bilateral 11/27/2011    Procedure: RENAL ANGIOGRAM;  Surgeon: Sherren Mocha, MD;  Location: [facility] CATH LAB;  Service: Cardiovascular;  Laterality: Bilateral;  possible PTA/stent  . Abdominal angiogram N/A 11/27/2011    Procedure: ABDOMINAL ANGIOGRAM;  Surgeon: Sherren Mocha, MD;  Location: [facility] CATH LAB;  Service: Cardiovascular;  Laterality: N/A;   Family History  Problem Relation Age of Onset  . Hypertension Mother   . Hypertension Brother    History  Substance Use Topics  . Smoking status: Former Smoker -- 73 years    Types: Pipe, Cigarettes    Quit date: 08/19/1996  . Smokeless tobacco: Never Used     Comment: 11/27/11 "smoked pipe since 1998; quit cigarettes 1998"  . Alcohol Use: 8.4 oz/week    14 Shots of liquor per week     Comment: 11/27/11 "bourbon"    Review of Systems 10 Systems reviewed and are negative for acute change except as noted in the HPI.    Allergies  Review of patient's allergies indicates no known allergies.  Home Medications   Prior to Admission medications   Medication Sig Start Date End Date Taking? Authorizing  Provider  amLODipine (NORVASC) 10 MG tablet Take 10 mg by mouth daily.  11/30/14  Yes Historical Provider, MD  aspirin EC 81 MG tablet Take 81 mg by mouth daily.   Yes Historical Provider, MD  carvedilol (COREG) 12.5 MG tablet Take 25 mg by mouth 2 (two) times daily with a meal.    Yes Historical Provider, MD  Cholecalciferol (VITAMIN D3) 2000 UNITS TABS Take 2,000 mg by mouth daily.    Yes Historical Provider, MD  fenofibrate (TRICOR) 145 MG tablet Take 145 mg by mouth daily.   Yes Historical Provider, MD  furosemide (LASIX) 20 MG tablet Take 20 mg by mouth daily.   Yes  Historical Provider, MD  ibuprofen (ADVIL,MOTRIN) 200 MG tablet Take 400 mg by mouth every 6 (six) hours as needed for headache (headache).   Yes Historical Provider, MD  rosuvastatin (CRESTOR) 40 MG tablet Take 40 mg by mouth daily.   Yes Historical Provider, MD  tamsulosin (FLOMAX) 0.4 MG CAPS capsule Take 0.4 mg by mouth daily after breakfast.  10/28/14  Yes Historical Provider, MD  docusate sodium (COLACE) 100 MG capsule Take 1 capsule (100 mg total) by mouth daily. 12/19/14   Charlesetta Shanks, MD  oxyCODONE-acetaminophen (PERCOCET) 5-325 MG per tablet Take 1-2 tablets by mouth every 6 (six) hours as needed. 12/19/14   Charlesetta Shanks, MD  predniSONE (DELTASONE) 10 MG tablet Take 2 tablets (20 mg total) by mouth daily. 12/19/14   Charlesetta Shanks, MD   BP 132/71 mmHg  Pulse 58  Temp(Src) 97.6 F (36.4 C) (Oral)  Resp 16  SpO2 96% Physical Exam  Constitutional: He is oriented to person, place, and time. He appears well-developed and well-nourished.  Patient is thin but well-developed. He is alert and has no respiratory distress.  HENT:  Head: Normocephalic and atraumatic.  Eyes: EOM are normal. Pupils are equal, round, and reactive to light.  Neck: Neck supple.  Cardiovascular: Normal rate, regular rhythm, normal heart sounds and intact distal pulses.   Pulmonary/Chest: Effort normal and breath sounds normal.  Abdominal: Soft. Bowel sounds are normal. He exhibits no distension. There is no tenderness.  Bilateral femoral pulses normal.  Musculoskeletal: Normal range of motion. He exhibits no edema or tenderness.  Normal visual inspection of the back. There is no reproducible bony point tenderness throughout the spine or in the sacroiliac regions. No CVA tenderness. No rashes.  Neurological: He is alert and oriented to person, place, and time. He has normal strength. Coordination normal. GCS eye subscore is 4. GCS verbal subscore is 5. GCS motor subscore is 6.  In a sitting position the patient can  flex and extend both legs. He does have 2+ patellar reflexes. He endorses sensation to light touch bilaterally.  Skin: Skin is warm, dry and intact.  Psychiatric: He has a normal mood and affect.    ED Course  Procedures (including critical care time) Labs Review Labs Reviewed  CBC WITH DIFFERENTIAL/PLATELET - Abnormal; Notable for the following:    Platelets 131 (*)    All other components within normal limits  BASIC METABOLIC PANEL - Abnormal; Notable for the following:    Glucose, Bld 144 (*)    BUN 39 (*)    Creatinine, Ser 1.37 (*)    GFR calc non Af Amer 46 (*)    GFR calc Af Amer 54 (*)    All other components within normal limits  URINALYSIS, ROUTINE W REFLEX MICROSCOPIC    Imaging Review Mr Lumbar Spine W  Wo Contrast (assess For Abscess, Cord Compression)  12/19/2014   CLINICAL DATA:  Lower back pain radiating down both legs for 2 weeks. Urinary incontinence.  EXAM: MRI LUMBAR SPINE WITHOUT AND WITH CONTRAST  TECHNIQUE: Multiplanar and multiecho pulse sequences of the lumbar spine were obtained without and with intravenous contrast.  CONTRAST:  38mL MULTIHANCE GADOBENATE DIMEGLUMINE 529 MG/ML IV SOLN  COMPARISON:  Abdominal MRA 11/12/2011  FINDINGS: There is slight left convex lumbar scoliosis. There is no significant listhesis. Vertebral body heights are preserved. There is diffuse lumbar disc desiccation. Mild-to-moderate disc space narrowing is present at L5-S1 with evidence of vacuum disc phenomenon. Mild multilevel degenerative endplate marrow changes are present. Minimal degenerative endplate edema and enhancement are noted L5-S1. Conus medullaris is normal in signal and terminates at L1-2.  There is asymmetric right renal atrophy. Small T2 hyperintense lesions in the right kidney are partially visualized and most likely represent cysts but are incompletely evaluated. Juxtarenal abdominal aortic aneurysm appear slightly larger than on the prior study, measuring approximately 4.0  cm in transverse diameter.  T12-L1 and L1-2:  Negative.  L2-3:  Mild disc bulging and facet hypertrophy without stenosis.  L3-4: Mild disc bulging and facet hypertrophy without stenosis. 6 mm cyst is incidentally noted outside of the spinal canal posterior to the right facet joint.  L4-5: Mild disc bulging, small superimposed left subarticular/foraminal disc protrusion, and mild facet hypertrophy result in mild left greater than right lateral recess stenosis, mild spinal stenosis, and mild left neural foraminal stenosis.  L5-S1: Mild disc bulging and facet hypertrophy results in mild left neural foraminal narrowing without spinal stenosis.  IMPRESSION: 1. Mild multilevel lumbar disc and facet degeneration, most notable at L4-5 where there is mild spinal stenosis and left greater than right lateral recess stenosis. 2. Slightly increased size of juxtarenal abdominal aortic aneurysm. Recommend followup by ultrasound in 1 year. This recommendation follows ACR consensus guidelines: White Paper of the ACR Incidental Findings Committee II on Vascular Findings. J Am Coll Radiol 2013; 10:789-794.   Electronically Signed   By: Logan Bores   On: 12/19/2014 15:28     EKG Interpretation None      MDM   Final diagnoses:  Sciatica, unspecified laterality   At this time MRI does not show significant cord compression or stenosis. Patient does have multilevel degenerative disc disease. Patient does have intact reflexes and intact lower extremity motor strength. A trial of prednisone and low-dose Percocet will be given for the next 2 days and the patient has orthopedic follow-up on Wednesday.    Charlesetta Shanks, MD 12/19/14 [phone]

## 2014-12-21 ENCOUNTER — Telehealth: Payer: Self-pay | Admitting: Cardiovascular Disease

## 2014-12-21 DIAGNOSIS — M5441 Lumbago with sciatica, right side: Secondary | ICD-10-CM | POA: Diagnosis not present

## 2014-12-21 DIAGNOSIS — M5442 Lumbago with sciatica, left side: Secondary | ICD-10-CM | POA: Diagnosis not present

## 2014-12-21 NOTE — Telephone Encounter (Signed)
New message      Pt had an MRI on Monday-----ER did it because pt had back pain.  Dr Coralee Pesa surgeon---saw pt today and mentioned about a cyst on his rectum area (it was seen on MRI).  Family want to know if Dr Burt Knack is aware of this cyst.  They know about the aneurysm but want to know if Dr Burt Knack is aware of the cyst

## 2014-12-21 NOTE — Telephone Encounter (Signed)
I spoke with Mr and Mrs Bhargava and made them aware that the MRI did not comment on any findings in regards to the pt's rectum.  The MRI did find a possible cyst in right kidney.  I made them aware that this would need to be discussed with Dr Deterding to determine if there is any other follow-up needed. Pt verbalized understanding. The pt has renal artery duplex follow-up on a regular basis in our office to follow-up on his stent.

## 2015-01-31 DIAGNOSIS — R233 Spontaneous ecchymoses: Secondary | ICD-10-CM | POA: Diagnosis not present

## 2015-01-31 DIAGNOSIS — E1122 Type 2 diabetes mellitus with diabetic chronic kidney disease: Secondary | ICD-10-CM | POA: Diagnosis not present

## 2015-01-31 DIAGNOSIS — Z0001 Encounter for general adult medical examination with abnormal findings: Secondary | ICD-10-CM | POA: Diagnosis not present

## 2015-01-31 DIAGNOSIS — E782 Mixed hyperlipidemia: Secondary | ICD-10-CM | POA: Diagnosis not present

## 2015-01-31 DIAGNOSIS — I10 Essential (primary) hypertension: Secondary | ICD-10-CM | POA: Diagnosis not present

## 2015-02-09 NOTE — Addendum Note (Signed)
Addended by: Leanord Asal T on: 02/09/2015 04:09 PM   Modules accepted: Orders

## 2015-02-13 ENCOUNTER — Other Ambulatory Visit: Payer: Self-pay

## 2015-02-14 ENCOUNTER — Ambulatory Visit (INDEPENDENT_AMBULATORY_CARE_PROVIDER_SITE_OTHER): Payer: MEDICARE | Admitting: Emergency Medicine

## 2015-02-14 VITALS — BP 130/88 | HR 65 | Temp 97.6°F | Resp 16 | Ht 68.5 in | Wt 134.4 lb

## 2015-02-14 DIAGNOSIS — I701 Atherosclerosis of renal artery: Secondary | ICD-10-CM

## 2015-02-14 DIAGNOSIS — T63481A Toxic effect of venom of other arthropod, accidental (unintentional), initial encounter: Secondary | ICD-10-CM | POA: Diagnosis not present

## 2015-02-14 DIAGNOSIS — IMO0001 Reserved for inherently not codable concepts without codable children: Secondary | ICD-10-CM

## 2015-02-14 MED ORDER — METHYLPREDNISOLONE ACETATE 80 MG/ML IJ SUSP
120.0000 mg | Freq: Once | INTRAMUSCULAR | Status: AC
  Administered 2015-02-14: 120 mg via INTRAMUSCULAR

## 2015-02-14 NOTE — Patient Instructions (Signed)

## 2015-02-14 NOTE — Progress Notes (Signed)
Subjective:  Patient ID: Chris Peterson, male    DOB: Sep 22, 1929  Age: 81 y.o. MRN: [id number]  CC: bee sting\   HPI Chris Peterson presents  with an insect bite to the left ear. He said he was outside yesterday. Vitamin B. Has no respiratory distress or difficulty swallowing. Has no facial rash or swelling. His left ear is rather swollen and itchy. Denies any improvement with over-the-counter medication  History Chris Peterson has a past medical history of HTN (hypertension); High cholesterol; Dysrhythmia; Type II diabetes mellitus; Hemorrhoids; Blood transfusion; Kidney disease, chronic, stage III (moderate, EGFR 30-59 ml/min); Atherosclerotic renal artery stenosis, bilateral; AAA (abdominal aortic aneurysm); and Pericardial effusion.   He has past surgical history that includes Renal artery stent (11/27/11); Cataract extraction w/ intraocular lens  implant, bilateral (1990's); Nasal septum surgery (1980's); renal angiogram (Bilateral, 11/27/2011); and abdominal angiogram (N/A, 11/27/2011).   His  family history includes Hypertension in his brother and mother.  He   reports that he quit smoking about 18 years ago. His smoking use included Pipe and Cigarettes. He quit after 65 years of use. He has never used smokeless tobacco. He reports that he drinks about 8.4 oz of alcohol per week. He reports that he does not use illicit drugs.  Outpatient Prescriptions Prior to Visit  Medication Sig Dispense Refill  . amLODipine (NORVASC) 10 MG tablet Take 10 mg by mouth daily.     [name] aspirin EC 81 MG tablet Take 81 mg by mouth daily.    . carvedilol (COREG) 12.5 MG tablet Take 25 mg by mouth 2 (two) times daily with a meal.     . Cholecalciferol (VITAMIN D3) 2000 UNITS TABS Take 32,000 mg by mouth daily.     . fenofibrate (TRICOR) 145 MG tablet Take 145 mg by mouth daily.    . furosemide (LASIX) 20 MG tablet Take 20 mg by mouth daily.    . rosuvastatin (CRESTOR) 40 MG tablet Take 40 mg by mouth daily.     . tamsulosin (FLOMAX) 0.4 MG CAPS capsule Take 0.4 mg by mouth daily after breakfast.     . docusate sodium (COLACE) 100 MG capsule Take 1 capsule (100 mg total) by mouth daily. (Patient not taking: Reported on 02/14/2015) 30 capsule 0  . ibuprofen (ADVIL,MOTRIN) 200 MG tablet Take 400 mg by mouth every 6 (six) hours as needed for headache (headache).    . oxyCODONE-acetaminophen (PERCOCET) 5-325 MG per tablet Take 1-2 tablets by mouth every 6 (six) hours as needed. (Patient not taking: Reported on 02/14/2015) 20 tablet 0  . predniSONE (DELTASONE) 10 MG tablet Take 2 tablets (20 mg total) by mouth daily. (Patient not taking: Reported on 02/14/2015) 10 tablet 0   No facility-administered medications prior to visit.    History   Social History  . Marital Status: Married    Spouse Name: N/A  . Number of Children: N/A  . Years of Education: N/A   Social History Main Topics  . Smoking status: Former Smoker -- 64 years    Types: Pipe, Cigarettes    Quit date: 08/19/1996  . Smokeless tobacco: Never Used     Comment: 11/27/11 "smoked pipe since 1998; quit cigarettes 1998"  . Alcohol Use: 8.4 oz/week    14 Shots of liquor per week     Comment: 11/27/11 "bourbon"  . Drug Use: No  . Sexual Activity: No   Other Topics Concern  . None   Social History Narrative  Review of Systems  Constitutional: Negative for fever, chills and appetite change.  HENT: Negative for congestion, ear pain, postnasal drip, sinus pressure and sore throat.   Eyes: Negative for pain and redness.  Respiratory: Negative for cough, shortness of breath and wheezing.   Cardiovascular: Negative for leg swelling.  Gastrointestinal: Negative for nausea, vomiting, abdominal pain, diarrhea, constipation and blood in stool.  Endocrine: Negative for polyuria.  Genitourinary: Negative for dysuria, urgency, frequency and flank pain.  Musculoskeletal: Negative for gait problem.  Skin: Negative for rash.  Neurological:  Negative for weakness and headaches.  Psychiatric/Behavioral: Negative for confusion and decreased concentration. The patient is not nervous/anxious.     Objective:  BP 130/88 mmHg  Pulse 65  Temp(Src) 97.6 F (36.4 C) (Oral)  Resp 16  Ht 5' 8.5" (1.74 m)  Wt 134 lb 6.4 oz (60.963 kg)  BMI 20.14 kg/m2  SpO2 98%  Physical Exam  Constitutional: He is oriented to person, place, and time. He appears well-developed and well-nourished. No distress.  HENT:  Head: Normocephalic and atraumatic.  Right Ear: External ear normal.  Left Ear: External ear normal.  Nose: Nose normal.  Eyes: Conjunctivae and EOM are normal. Pupils are equal, round, and reactive to light. No scleral icterus.  Neck: Normal range of motion. Neck supple. No tracheal deviation present.  Cardiovascular: Normal rate, regular rhythm and normal heart sounds.   Pulmonary/Chest: Effort normal. No respiratory distress. He has no wheezes. He has no rales.  Abdominal: He exhibits no mass. There is no tenderness. There is no rebound and no guarding.  Musculoskeletal: He exhibits no edema.  Lymphadenopathy:    He has no cervical adenopathy.  Neurological: He is alert and oriented to person, place, and time. Coordination normal.  Skin: Skin is warm and dry. No rash noted.  Psychiatric: He has a normal mood and affect. His behavior is normal.      Assessment & Plan:   Chris Peterson was seen today for bee sting  Diagnoses and all orders for this visit:  Hymenoptera sting, accidental or unintentional, initial encounter Orders: -     methylPREDNISolone acetate (DEPO-MEDROL) injection 120 mg; Inject 1.5 mLs (120 mg total) into the muscle once.   I am having Chris Peterson maintain his fenofibrate, rosuvastatin, Vitamin D3, aspirin EC, tamsulosin, furosemide, carvedilol, amLODipine, ibuprofen, oxyCODONE-acetaminophen, predniSONE, and docusate sodium. We administered methylPREDNISolone acetate.  Meds ordered this encounter    Medications  . methylPREDNISolone acetate (DEPO-MEDROL) injection 120 mg    Sig:    He was instructed to take Benadryl 4 times a day.  Appropriate red flag conditions were discussed with the patient as well as actions that should be taken.  Patient expressed his understanding.  Follow-up: Return if symptoms worsen or fail to improve.  Roselee Culver, MD

## 2015-03-06 ENCOUNTER — Other Ambulatory Visit: Payer: Self-pay

## 2015-03-06 ENCOUNTER — Ambulatory Visit (HOSPITAL_COMMUNITY): Payer: MEDICARE | Attending: Cardiovascular Disease

## 2015-03-06 ENCOUNTER — Other Ambulatory Visit (HOSPITAL_COMMUNITY): Payer: MEDICARE

## 2015-03-06 DIAGNOSIS — I351 Nonrheumatic aortic (valve) insufficiency: Secondary | ICD-10-CM | POA: Insufficient documentation

## 2015-03-06 DIAGNOSIS — I3139 Other pericardial effusion (noninflammatory): Secondary | ICD-10-CM

## 2015-03-06 DIAGNOSIS — I313 Pericardial effusion (noninflammatory): Secondary | ICD-10-CM | POA: Diagnosis not present

## 2015-03-06 DIAGNOSIS — I340 Nonrheumatic mitral (valve) insufficiency: Secondary | ICD-10-CM | POA: Diagnosis not present

## 2015-03-06 DIAGNOSIS — I319 Disease of pericardium, unspecified: Secondary | ICD-10-CM | POA: Diagnosis not present

## 2015-04-17 DIAGNOSIS — L570 Actinic keratosis: Secondary | ICD-10-CM | POA: Diagnosis not present

## 2015-04-17 DIAGNOSIS — D225 Melanocytic nevi of trunk: Secondary | ICD-10-CM | POA: Diagnosis not present

## 2015-04-17 DIAGNOSIS — L821 Other seborrheic keratosis: Secondary | ICD-10-CM | POA: Diagnosis not present

## 2015-04-17 DIAGNOSIS — L820 Inflamed seborrheic keratosis: Secondary | ICD-10-CM | POA: Diagnosis not present

## 2015-05-29 DIAGNOSIS — Z23 Encounter for immunization: Secondary | ICD-10-CM | POA: Diagnosis not present

## 2015-05-30 DIAGNOSIS — N183 Chronic kidney disease, stage 3 (moderate): Secondary | ICD-10-CM | POA: Diagnosis not present

## 2015-06-13 DIAGNOSIS — I129 Hypertensive chronic kidney disease with stage 1 through stage 4 chronic kidney disease, or unspecified chronic kidney disease: Secondary | ICD-10-CM | POA: Diagnosis not present

## 2015-08-02 DIAGNOSIS — E782 Mixed hyperlipidemia: Secondary | ICD-10-CM | POA: Diagnosis not present

## 2015-08-02 DIAGNOSIS — E1122 Type 2 diabetes mellitus with diabetic chronic kidney disease: Secondary | ICD-10-CM | POA: Diagnosis not present

## 2015-10-26 DIAGNOSIS — E119 Type 2 diabetes mellitus without complications: Secondary | ICD-10-CM | POA: Diagnosis not present

## 2015-10-26 DIAGNOSIS — H524 Presbyopia: Secondary | ICD-10-CM | POA: Diagnosis not present

## 2015-10-26 DIAGNOSIS — H02104 Unspecified ectropion of left upper eyelid: Secondary | ICD-10-CM | POA: Diagnosis not present

## 2015-10-26 DIAGNOSIS — H52223 Regular astigmatism, bilateral: Secondary | ICD-10-CM | POA: Diagnosis not present

## 2015-10-26 DIAGNOSIS — H5213 Myopia, bilateral: Secondary | ICD-10-CM | POA: Diagnosis not present

## 2015-10-26 DIAGNOSIS — H02105 Unspecified ectropion of left lower eyelid: Secondary | ICD-10-CM | POA: Diagnosis not present

## 2015-10-26 DIAGNOSIS — H02101 Unspecified ectropion of right upper eyelid: Secondary | ICD-10-CM | POA: Diagnosis not present

## 2015-10-26 DIAGNOSIS — H02102 Unspecified ectropion of right lower eyelid: Secondary | ICD-10-CM | POA: Diagnosis not present

## 2015-10-27 DIAGNOSIS — H6123 Impacted cerumen, bilateral: Secondary | ICD-10-CM | POA: Diagnosis not present

## 2015-11-30 DIAGNOSIS — I129 Hypertensive chronic kidney disease with stage 1 through stage 4 chronic kidney disease, or unspecified chronic kidney disease: Secondary | ICD-10-CM | POA: Diagnosis not present

## 2015-12-25 DIAGNOSIS — I739 Peripheral vascular disease, unspecified: Secondary | ICD-10-CM | POA: Diagnosis not present

## 2015-12-25 DIAGNOSIS — I129 Hypertensive chronic kidney disease with stage 1 through stage 4 chronic kidney disease, or unspecified chronic kidney disease: Secondary | ICD-10-CM | POA: Diagnosis not present

## 2015-12-25 DIAGNOSIS — D631 Anemia in chronic kidney disease: Secondary | ICD-10-CM | POA: Diagnosis not present

## 2015-12-25 DIAGNOSIS — I701 Atherosclerosis of renal artery: Secondary | ICD-10-CM | POA: Diagnosis not present

## 2015-12-25 DIAGNOSIS — E785 Hyperlipidemia, unspecified: Secondary | ICD-10-CM | POA: Diagnosis not present

## 2015-12-25 DIAGNOSIS — N183 Chronic kidney disease, stage 3 (moderate): Secondary | ICD-10-CM | POA: Diagnosis not present

## 2015-12-25 DIAGNOSIS — N400 Enlarged prostate without lower urinary tract symptoms: Secondary | ICD-10-CM | POA: Diagnosis not present

## 2015-12-25 DIAGNOSIS — N2581 Secondary hyperparathyroidism of renal origin: Secondary | ICD-10-CM | POA: Diagnosis not present

## 2015-12-25 DIAGNOSIS — Z0000 Encounter for general adult medical examination without abnormal findings: Secondary | ICD-10-CM | POA: Diagnosis not present

## 2015-12-25 DIAGNOSIS — Z720 Tobacco use: Secondary | ICD-10-CM | POA: Diagnosis not present

## 2015-12-25 DIAGNOSIS — E1129 Type 2 diabetes mellitus with other diabetic kidney complication: Secondary | ICD-10-CM | POA: Diagnosis not present

## 2016-01-27 DIAGNOSIS — R197 Diarrhea, unspecified: Secondary | ICD-10-CM | POA: Diagnosis not present

## 2016-01-27 DIAGNOSIS — E860 Dehydration: Secondary | ICD-10-CM | POA: Diagnosis not present

## 2016-02-07 DIAGNOSIS — E1122 Type 2 diabetes mellitus with diabetic chronic kidney disease: Secondary | ICD-10-CM | POA: Diagnosis not present

## 2016-02-07 DIAGNOSIS — E782 Mixed hyperlipidemia: Secondary | ICD-10-CM | POA: Diagnosis not present

## 2016-02-07 DIAGNOSIS — I10 Essential (primary) hypertension: Secondary | ICD-10-CM | POA: Diagnosis not present

## 2016-02-07 DIAGNOSIS — Z0000 Encounter for general adult medical examination without abnormal findings: Secondary | ICD-10-CM | POA: Diagnosis not present

## 2016-02-07 DIAGNOSIS — R233 Spontaneous ecchymoses: Secondary | ICD-10-CM | POA: Diagnosis not present

## 2016-02-29 ENCOUNTER — Other Ambulatory Visit: Payer: Self-pay | Admitting: Cardiovascular Disease

## 2016-02-29 DIAGNOSIS — I701 Atherosclerosis of renal artery: Secondary | ICD-10-CM

## 2016-03-04 ENCOUNTER — Other Ambulatory Visit: Payer: Self-pay | Admitting: Cardiovascular Disease

## 2016-03-04 DIAGNOSIS — I350 Nonrheumatic aortic (valve) stenosis: Secondary | ICD-10-CM

## 2016-03-06 ENCOUNTER — Other Ambulatory Visit (HOSPITAL_COMMUNITY): Payer: Self-pay

## 2016-03-06 ENCOUNTER — Ambulatory Visit (HOSPITAL_COMMUNITY)
Admission: RE | Admit: 2016-03-06 | Discharge: 2016-03-06 | Disposition: A | Discharge status: Home / Self Care | Payer: MEDICARE | Source: Ambulatory Visit | Attending: Urology | Admitting: Urology

## 2016-03-06 DIAGNOSIS — E1122 Type 2 diabetes mellitus with diabetic chronic kidney disease: Secondary | ICD-10-CM | POA: Insufficient documentation

## 2016-03-06 DIAGNOSIS — I700 Atherosclerosis of aorta: Secondary | ICD-10-CM | POA: Diagnosis not present

## 2016-03-06 DIAGNOSIS — I129 Hypertensive chronic kidney disease with stage 1 through stage 4 chronic kidney disease, or unspecified chronic kidney disease: Secondary | ICD-10-CM | POA: Diagnosis not present

## 2016-03-06 DIAGNOSIS — N183 Chronic kidney disease, stage 3 (moderate): Secondary | ICD-10-CM | POA: Diagnosis not present

## 2016-03-06 DIAGNOSIS — E7800 Pure hypercholesterolemia, unspecified: Secondary | ICD-10-CM | POA: Insufficient documentation

## 2016-03-06 DIAGNOSIS — I701 Atherosclerosis of renal artery: Secondary | ICD-10-CM | POA: Diagnosis not present

## 2016-03-07 ENCOUNTER — Other Ambulatory Visit: Payer: Self-pay

## 2016-03-07 ENCOUNTER — Ambulatory Visit (HOSPITAL_COMMUNITY): Payer: MEDICARE | Attending: Cardiology

## 2016-03-07 DIAGNOSIS — E1122 Type 2 diabetes mellitus with diabetic chronic kidney disease: Secondary | ICD-10-CM | POA: Insufficient documentation

## 2016-03-07 DIAGNOSIS — Z87891 Personal history of nicotine dependence: Secondary | ICD-10-CM | POA: Diagnosis not present

## 2016-03-07 DIAGNOSIS — I358 Other nonrheumatic aortic valve disorders: Secondary | ICD-10-CM | POA: Insufficient documentation

## 2016-03-07 DIAGNOSIS — I313 Pericardial effusion (noninflammatory): Secondary | ICD-10-CM | POA: Insufficient documentation

## 2016-03-07 DIAGNOSIS — I340 Nonrheumatic mitral (valve) insufficiency: Secondary | ICD-10-CM | POA: Diagnosis not present

## 2016-03-07 DIAGNOSIS — I350 Nonrheumatic aortic (valve) stenosis: Secondary | ICD-10-CM | POA: Diagnosis not present

## 2016-03-07 DIAGNOSIS — N189 Chronic kidney disease, unspecified: Secondary | ICD-10-CM | POA: Insufficient documentation

## 2016-03-07 DIAGNOSIS — I371 Nonrheumatic pulmonary valve insufficiency: Secondary | ICD-10-CM | POA: Insufficient documentation

## 2016-03-07 DIAGNOSIS — I77810 Thoracic aortic ectasia: Secondary | ICD-10-CM | POA: Insufficient documentation

## 2016-03-07 DIAGNOSIS — I1310 Hypertensive heart and chronic kidney disease without heart failure, with stage 1 through stage 4 chronic kidney disease, or unspecified chronic kidney disease: Secondary | ICD-10-CM | POA: Insufficient documentation

## 2016-03-11 ENCOUNTER — Telehealth: Payer: Self-pay | Admitting: Cardiovascular Disease

## 2016-03-11 NOTE — Telephone Encounter (Signed)
New message    Pt wants to get test results for heart echo. Please call.

## 2016-03-11 NOTE — Telephone Encounter (Signed)
Left message on machine for pt to contact the office.   

## 2016-03-11 NOTE — Telephone Encounter (Signed)
I spoke with the pt and made him aware of echo and renal duplex results.  Pt scheduled to see Dr Burt Knack 03/21/16.

## 2016-03-11 NOTE — Telephone Encounter (Signed)
Follow-up      The pt is returning Lauren call

## 2016-03-20 ENCOUNTER — Encounter: Payer: Self-pay | Admitting: Cardiovascular Disease

## 2016-03-21 ENCOUNTER — Encounter: Payer: Self-pay | Admitting: Cardiovascular Disease

## 2016-03-21 ENCOUNTER — Ambulatory Visit (INDEPENDENT_AMBULATORY_CARE_PROVIDER_SITE_OTHER): Payer: MEDICARE | Admitting: Cardiovascular Disease

## 2016-03-21 VITALS — BP 148/74 | HR 61 | Ht 69.0 in | Wt 134.9 lb

## 2016-03-21 DIAGNOSIS — I714 Abdominal aortic aneurysm, without rupture, unspecified: Secondary | ICD-10-CM

## 2016-03-21 DIAGNOSIS — I319 Disease of pericardium, unspecified: Secondary | ICD-10-CM

## 2016-03-21 DIAGNOSIS — I701 Atherosclerosis of renal artery: Secondary | ICD-10-CM | POA: Diagnosis not present

## 2016-03-21 DIAGNOSIS — I3139 Other pericardial effusion (noninflammatory): Secondary | ICD-10-CM

## 2016-03-21 DIAGNOSIS — I313 Pericardial effusion (noninflammatory): Secondary | ICD-10-CM

## 2016-03-21 NOTE — Patient Instructions (Signed)
Medication Instructions:  Your physician recommends that you continue on your current medications as directed. Please refer to the Current Medication list given to you today.  Labwork: No new orders.   Testing/Procedures: Your physician has requested that you have an abdominal aorta duplex in January 2018. During this test, an ultrasound is used to evaluate the aorta. Allow 30 minutes for this exam. Do not eat after midnight the day before and avoid carbonated beverages  Your physician has requested that you have an echocardiogram in 1 YEAR. Echocardiography is a painless test that uses sound waves to create images of your heart. It provides your doctor with information about the size and shape of your heart and how well your heart's chambers and valves are working. This procedure takes approximately one hour. There are no restrictions for this procedure.  Your physician has requested that you have a renal artery duplex in 1 YEAR. During this test, an ultrasound is used to evaluate blood flow to the kidneys. Allow one hour for this exam. Do not eat after midnight the day before and avoid carbonated beverages. Take your medications as you usually do.  Follow-Up: Your physician wants you to follow-up in: 1 YEAR with Dr Burt Knack.  You will receive a reminder letter in the mail two months in advance. If you don't receive a letter, please call our office to schedule the follow-up appointment.   Any Other Special Instructions Will Be Listed Below (If Applicable).     If you need a refill on your cardiac medications before your next appointment, please call your pharmacy.

## 2016-03-21 NOTE — Progress Notes (Signed)
Cardiology Office Note Date:  03/21/2016   ID:  Chris Peterson, DOB September 25, 1929, MRN [id number]  PCP:   Melinda Crutch, MD  Cardiologist:  Sherren Mocha, MD    Chief Complaint  Patient presents with  . Follow-up    renal atherosclerosis/chronic pericardial effusion     History of Present Illness: Chris Peterson is a 82 y.o. male who presents for follow-up of renal artery stenosis and idiopathic pericardial effusion.  The patient underwent left renal artery stenting and has had continued patency of that stent. He has been noted to have severe right renal artery stenosis. This is been treated medically in the setting of well controlled blood pressure and stable renal function. He also was incidentally noted to have a pericardial effusion and this has been followed by serial echocardiography with no change over time and no evidence of hemodynamic sequelae.  The patient has been followed closely by Dr Deterding with recent changes made in his antihypertensive regimen to avoid hypotension. He had complained of diizziness and this has resolved since his medicines were decreased. He really feels quite well. He is still getting out and doing yard work. He has no chest pain, chest pressure, or shortness of breath. He lost some weight recently after a GI illness but has gained back 4 pounds.   Past Medical History:  Diagnosis Date  . AAA (abdominal aortic aneurysm) (HCC)    Infrarenal AAA - maximal diameter of 3.5 cm   . Atherosclerotic renal artery stenosis, bilateral (HCC)    Bilateral renal angiography 11/27/11, Right 70% prox, Left 90% prox s/p LRA PTA/Stenting - 6.0x41m ;  12/12/2011 Renal Duplex: Patent LRA stent & > 60% RRA ost stenosis  . Blood transfusion    S/P "lost blood due to hemorrhoids"  . Dysrhythmia    "fast one time"  . Hemorrhoids   . High cholesterol   . HTN (hypertension)   . Kidney disease, chronic, stage III (moderate, EGFR 30-59 ml/min)   . Pericardial effusion    a. incidentally noted on renal u/s 11/2011;  b.  12/16/2011 Echo:  EF 55-60%, Gr1 DD, Mild MR, PASP 32, Mod circumferential peric effusion w/o hemodynamic compromise. Density in pericardial space - consider MRI  . [place] II diabetes [place] ([place]     Past Surgical History:  Procedure Laterality Date  . ABDOMINAL ANGIOGRAM N/A 11/27/2011   Procedure: ABDOMINAL ANGIOGRAM;  Surgeon: MSherren Mocha MD;  [facility];  Service: Cardiovascular;  Laterality: N/A;  . CATARACT EXTRACTION W/ INTRAOCULAR LENS  IMPLANT, BILATERAL  1990's  . NASAL SEPTUM SURGERY  1980's  . RENAL ANGIOGRAM Bilateral 11/27/2011   Procedure: RENAL ANGIOGRAM;  Surgeon: MSherren Mocha MD;  [facility];  Service: Cardiovascular;  Laterality: Bilateral;  possible PTA/stent  . RENAL ARTERY STENT  11/27/11    Current Outpatient Prescriptions  Medication Sig Dispense Refill  . amLODipine (NORVASC) 5 MG tablet Take 5 mg by mouth daily.    .Marland Kitchenaspirin EC 81 MG tablet Take 81 mg by mouth daily.    . carvedilol (COREG) 12.5 MG tablet Take 25 mg by mouth 2 (two) times daily with a meal.     . Cholecalciferol (VITAMIN D3) 2000 UNITS TABS Take 32,000 mg by mouth daily.     . fenofibrate 160 MG tablet Take 160 mg by mouth daily.    . furosemide (LASIX) 20 MG tablet Take 20 mg by mouth daily.    . rosuvastatin (CRESTOR) 40 MG tablet  Take 40 mg by mouth daily.    . tamsulosin (FLOMAX) 0.4 MG CAPS capsule Take 0.4 mg by mouth daily after breakfast.      No current facility-administered medications for this visit.     Allergies:   Review of patient's allergies indicates no known allergies.   Social History:  The patient  reports that he quit smoking about 19 years ago. His smoking use included Pipe and Cigarettes. He quit after 65.00 years of use. He has never used smokeless tobacco. He reports that he drinks about 8.4 oz of alcohol per week . He reports that he does not use drugs.   Family History:  The patient's  family  history includes Hypertension in his brother and mother.    ROS:  Please see the history of present illness.   All other systems are reviewed and negative.    PHYSICAL EXAM: VS:  BP (!) 148/74 (BP Location: Left Arm, Patient Position: Sitting, Cuff Size: Normal)   Pulse 61   Ht _0  (1.753 m)   Wt 134 lb 14.4 oz (61.2 kg)   BMI 19.92 kg/m  , BMI Body mass index is 19.92 kg/m. GEN: Thin elderly male, in no acute distress  HEENT: normal  Neck: no JVD, no masses. No carotid bruits Cardiac: RRR without murmur or gallop                Respiratory:  clear to auscultation bilaterally, normal work of breathing GI: soft, nontender, nondistended, + BS MS: no deformity or atrophy  Ext: no pretibial edema Skin: warm and dry, no rash Neuro:  Strength and sensation are intact Psych: euthymic mood, full affect  EKG:  EKG is ordered today. The ekg ordered today shows normal sinus rhythm 61 bpm, right bundle branch block, unchanged from previous EKG 11/30/2014  Recent Labs: No results found for requested labs within last 8760 hours.   Lipid Panel  No results found for: CHOL, TRIG, HDL, CHOLHDL, VLDL, LDLCALC, LDLDIRECT    Wt Readings from Last 3 Encounters:  03/21/16 134 lb 14.4 oz (61.2 kg)  02/14/15 134 lb 6.4 oz (61 kg)  11/30/14 134 lb 6.4 oz (61 kg)     Cardiac Studies Reviewed: 2D Echo 03/07/2016: Study Conclusions  - Left ventricle: The cavity size was normal. There was moderate   concentric hypertrophy. Systolic function was normal. The   estimated ejection fraction was in the range of 55% to 60%. Wall   motion was normal; there were no regional wall motion   abnormalities. There was an increased relative contribution of   atrial contraction to ventricular filling. Doppler parameters are   consistent with abnormal left ventricular relaxation (grade 1   diastolic dysfunction). - Aortic valve: Moderate diffuse thickening and calcification   involving the right coronary  and noncoronary cusp. - Aorta: Aortic root dimension: 38 mm (ED). - Aortic root: The aortic root was mildly dilated. - Mitral valve: Calcified annulus. There was trivial regurgitation. - Pulmonic valve: There was trivial regurgitation. - Pericardium, extracardiac: A moderate, free-flowing pericardial   effusion was identified circumferential to the heart. The fluid   had no internal echoes.  ASSESSMENT AND PLAN: 1.  Chronic pericardial effusion, idiopathic: Echo images are reviewed today without change from previous. Previous serologic evaluation has been negative. Continue with annual follow-up.  2. Renal artery stenosis: The patient's most recent renal arterial Doppler study is reviewed. His left renal artery stent is patent. The right renal artery has severe ostial  and proximal stenosis. His blood pressure and renal function have been stable. We will continue with observation.  3. Hypertension with chronic kidney disease: Stable after recent medication adjustments by Dr. Jimmy Footman.  4. CKD III: reportedly stable. Last labs on file reviewed from May 2016 with creatinine 1.37 mg/dL.   Current medicines are reviewed with the patient today.  The patient does not have concerns regarding medicines.  Labs/ tests ordered today include:  No orders of the defined types were placed in this encounter.   Disposition:   FU one year  Signed, Sherren Mocha, MD  03/21/2016 12:05 PM    [address], [place], [place]  [address] Phone: [phone]; Fax: [phone]

## 2016-05-06 DIAGNOSIS — Z23 Encounter for immunization: Secondary | ICD-10-CM | POA: Diagnosis not present

## 2016-07-03 DIAGNOSIS — L570 Actinic keratosis: Secondary | ICD-10-CM | POA: Diagnosis not present

## 2016-07-03 DIAGNOSIS — L821 Other seborrheic keratosis: Secondary | ICD-10-CM | POA: Diagnosis not present

## 2016-07-03 DIAGNOSIS — L820 Inflamed seborrheic keratosis: Secondary | ICD-10-CM | POA: Diagnosis not present

## 2016-07-03 DIAGNOSIS — D485 Neoplasm of uncertain behavior of skin: Secondary | ICD-10-CM | POA: Diagnosis not present

## 2016-07-03 DIAGNOSIS — D225 Melanocytic nevi of trunk: Secondary | ICD-10-CM | POA: Diagnosis not present

## 2016-07-03 DIAGNOSIS — D1801 Hemangioma of skin and subcutaneous tissue: Secondary | ICD-10-CM | POA: Diagnosis not present

## 2016-07-03 DIAGNOSIS — C44319 Basal cell carcinoma of skin of other parts of face: Secondary | ICD-10-CM | POA: Diagnosis not present

## 2016-07-23 DIAGNOSIS — N400 Enlarged prostate without lower urinary tract symptoms: Secondary | ICD-10-CM | POA: Diagnosis not present

## 2016-07-23 DIAGNOSIS — E785 Hyperlipidemia, unspecified: Secondary | ICD-10-CM | POA: Diagnosis not present

## 2016-07-23 DIAGNOSIS — Z720 Tobacco use: Secondary | ICD-10-CM | POA: Diagnosis not present

## 2016-07-23 DIAGNOSIS — I701 Atherosclerosis of renal artery: Secondary | ICD-10-CM | POA: Diagnosis not present

## 2016-07-23 DIAGNOSIS — N183 Chronic kidney disease, stage 3 (moderate): Secondary | ICD-10-CM | POA: Diagnosis not present

## 2016-07-23 DIAGNOSIS — I739 Peripheral vascular disease, unspecified: Secondary | ICD-10-CM | POA: Diagnosis not present

## 2016-07-23 DIAGNOSIS — N189 Chronic kidney disease, unspecified: Secondary | ICD-10-CM | POA: Diagnosis not present

## 2016-07-23 DIAGNOSIS — E1129 Type 2 diabetes mellitus with other diabetic kidney complication: Secondary | ICD-10-CM | POA: Diagnosis not present

## 2016-07-23 DIAGNOSIS — I129 Hypertensive chronic kidney disease with stage 1 through stage 4 chronic kidney disease, or unspecified chronic kidney disease: Secondary | ICD-10-CM | POA: Diagnosis not present

## 2016-07-23 DIAGNOSIS — N2581 Secondary hyperparathyroidism of renal origin: Secondary | ICD-10-CM | POA: Diagnosis not present

## 2016-07-23 DIAGNOSIS — D631 Anemia in chronic kidney disease: Secondary | ICD-10-CM | POA: Diagnosis not present

## 2016-08-20 DIAGNOSIS — L905 Scar conditions and fibrosis of skin: Secondary | ICD-10-CM | POA: Diagnosis not present

## 2016-10-21 DIAGNOSIS — I10 Essential (primary) hypertension: Secondary | ICD-10-CM | POA: Diagnosis not present

## 2016-10-21 DIAGNOSIS — E1122 Type 2 diabetes mellitus with diabetic chronic kidney disease: Secondary | ICD-10-CM | POA: Diagnosis not present

## 2016-12-17 DIAGNOSIS — H43813 Vitreous degeneration, bilateral: Secondary | ICD-10-CM | POA: Diagnosis not present

## 2016-12-17 DIAGNOSIS — H04523 Eversion of bilateral lacrimal punctum: Secondary | ICD-10-CM | POA: Diagnosis not present

## 2016-12-17 DIAGNOSIS — H31093 Other chorioretinal scars, bilateral: Secondary | ICD-10-CM | POA: Diagnosis not present

## 2016-12-17 DIAGNOSIS — Z961 Presence of intraocular lens: Secondary | ICD-10-CM | POA: Diagnosis not present

## 2017-01-29 DIAGNOSIS — H6123 Impacted cerumen, bilateral: Secondary | ICD-10-CM | POA: Diagnosis not present

## 2017-02-11 DIAGNOSIS — E782 Mixed hyperlipidemia: Secondary | ICD-10-CM | POA: Diagnosis not present

## 2017-02-11 DIAGNOSIS — Z0000 Encounter for general adult medical examination without abnormal findings: Secondary | ICD-10-CM | POA: Diagnosis not present

## 2017-02-11 DIAGNOSIS — E1122 Type 2 diabetes mellitus with diabetic chronic kidney disease: Secondary | ICD-10-CM | POA: Diagnosis not present

## 2017-02-11 DIAGNOSIS — R233 Spontaneous ecchymoses: Secondary | ICD-10-CM | POA: Diagnosis not present

## 2017-02-18 DIAGNOSIS — Z85828 Personal history of other malignant neoplasm of skin: Secondary | ICD-10-CM | POA: Diagnosis not present

## 2017-02-18 DIAGNOSIS — L570 Actinic keratosis: Secondary | ICD-10-CM | POA: Diagnosis not present

## 2017-02-18 DIAGNOSIS — L905 Scar conditions and fibrosis of skin: Secondary | ICD-10-CM | POA: Diagnosis not present

## 2017-03-03 DIAGNOSIS — N400 Enlarged prostate without lower urinary tract symptoms: Secondary | ICD-10-CM | POA: Diagnosis not present

## 2017-03-03 DIAGNOSIS — N2581 Secondary hyperparathyroidism of renal origin: Secondary | ICD-10-CM | POA: Diagnosis not present

## 2017-03-03 DIAGNOSIS — I129 Hypertensive chronic kidney disease with stage 1 through stage 4 chronic kidney disease, or unspecified chronic kidney disease: Secondary | ICD-10-CM | POA: Diagnosis not present

## 2017-03-03 DIAGNOSIS — E785 Hyperlipidemia, unspecified: Secondary | ICD-10-CM | POA: Diagnosis not present

## 2017-03-03 DIAGNOSIS — E1129 Type 2 diabetes mellitus with other diabetic kidney complication: Secondary | ICD-10-CM | POA: Diagnosis not present

## 2017-03-03 DIAGNOSIS — N183 Chronic kidney disease, stage 3 (moderate): Secondary | ICD-10-CM | POA: Diagnosis not present

## 2017-03-03 DIAGNOSIS — I701 Atherosclerosis of renal artery: Secondary | ICD-10-CM | POA: Diagnosis not present

## 2017-03-03 DIAGNOSIS — D631 Anemia in chronic kidney disease: Secondary | ICD-10-CM | POA: Diagnosis not present

## 2017-03-03 DIAGNOSIS — I739 Peripheral vascular disease, unspecified: Secondary | ICD-10-CM | POA: Diagnosis not present

## 2017-03-03 DIAGNOSIS — Z720 Tobacco use: Secondary | ICD-10-CM | POA: Diagnosis not present

## 2017-03-21 ENCOUNTER — Other Ambulatory Visit (HOSPITAL_COMMUNITY): Payer: Self-pay

## 2017-04-02 ENCOUNTER — Inpatient Hospital Stay (HOSPITAL_COMMUNITY): Admission: RE | Admit: 2017-04-02 | Payer: MEDICARE | Source: Ambulatory Visit

## 2017-04-02 ENCOUNTER — Ambulatory Visit (HOSPITAL_COMMUNITY)
Admission: RE | Admit: 2017-04-02 | Discharge: 2017-04-02 | Disposition: A | Discharge status: Home / Self Care | Payer: MEDICARE | Source: Ambulatory Visit | Attending: Cardiovascular Disease | Admitting: Cardiovascular Disease

## 2017-04-02 DIAGNOSIS — I708 Atherosclerosis of other arteries: Secondary | ICD-10-CM | POA: Insufficient documentation

## 2017-04-02 DIAGNOSIS — E119 Type 2 diabetes mellitus without complications: Secondary | ICD-10-CM | POA: Diagnosis not present

## 2017-04-02 DIAGNOSIS — I701 Atherosclerosis of renal artery: Secondary | ICD-10-CM | POA: Diagnosis not present

## 2017-04-02 DIAGNOSIS — Z87891 Personal history of nicotine dependence: Secondary | ICD-10-CM | POA: Insufficient documentation

## 2017-04-02 DIAGNOSIS — I10 Essential (primary) hypertension: Secondary | ICD-10-CM | POA: Diagnosis not present

## 2017-04-02 DIAGNOSIS — I714 Abdominal aortic aneurysm, without rupture, unspecified: Secondary | ICD-10-CM

## 2017-04-03 ENCOUNTER — Other Ambulatory Visit (HOSPITAL_COMMUNITY): Payer: Self-pay

## 2017-04-17 DIAGNOSIS — L570 Actinic keratosis: Secondary | ICD-10-CM | POA: Diagnosis not present

## 2017-04-17 DIAGNOSIS — C44229 Squamous cell carcinoma of skin of left ear and external auricular canal: Secondary | ICD-10-CM | POA: Diagnosis not present

## 2017-04-17 DIAGNOSIS — D485 Neoplasm of uncertain behavior of skin: Secondary | ICD-10-CM | POA: Diagnosis not present

## 2017-05-19 ENCOUNTER — Other Ambulatory Visit: Payer: Self-pay

## 2017-05-19 ENCOUNTER — Ambulatory Visit (INDEPENDENT_AMBULATORY_CARE_PROVIDER_SITE_OTHER): Payer: MEDICARE | Admitting: Cardiovascular Disease

## 2017-05-19 ENCOUNTER — Ambulatory Visit (HOSPITAL_COMMUNITY): Payer: MEDICARE | Attending: Internal Medicine

## 2017-05-19 ENCOUNTER — Encounter: Payer: Self-pay | Admitting: Cardiovascular Disease

## 2017-05-19 VITALS — BP 164/60 | HR 68 | Ht 69.0 in | Wt 133.4 lb

## 2017-05-19 DIAGNOSIS — I714 Abdominal aortic aneurysm, without rupture, unspecified: Secondary | ICD-10-CM

## 2017-05-19 DIAGNOSIS — I313 Pericardial effusion (noninflammatory): Secondary | ICD-10-CM

## 2017-05-19 DIAGNOSIS — I3139 Other pericardial effusion (noninflammatory): Secondary | ICD-10-CM

## 2017-05-19 DIAGNOSIS — I5030 Unspecified diastolic (congestive) heart failure: Secondary | ICD-10-CM | POA: Diagnosis not present

## 2017-05-19 DIAGNOSIS — I701 Atherosclerosis of renal artery: Secondary | ICD-10-CM

## 2017-05-19 NOTE — Patient Instructions (Signed)
Medication Instructions:  Your provider recommends that you continue on your current medications as directed. Please refer to the Current Medication list given to you today.    Labwork: None  Testing/Procedures: Your provider has requested that you have an echocardiogram. Echocardiography is a painless test that uses sound waves to create images of your heart. It provides your doctor with information about the size and shape of your heart and how well your heart's chambers and valves are working. This procedure takes approximately one hour. There are no restrictions for this procedure.  Follow-Up: Your provider wants you to follow-up in: 1 year with Dr. Cooper. You will receive a reminder letter in the mail two months in advance. If you don't receive a letter, please call our office to schedule the follow-up appointment.    Any Other Special Instructions Will Be Listed Below (If Applicable).     If you need a refill on your cardiac medications before your next appointment, please call your pharmacy.   

## 2017-05-19 NOTE — Progress Notes (Signed)
Cardiology Office Note Date:  05/19/2017   ID:  Chris Peterson, DOB 08-Apr-1930, MRN [id number]  PCP:  Lawerance Cruel, MD  Cardiologist:  Sherren Mocha, MD    Chief Complaint  Patient presents with  . Follow-up     History of Present Illness: Chris Peterson is a 83 y.o. male who presents for follow-up of renal artery stenosis and idiopathic pericardial effusion. The patient underwent left renal artery stenting and has had continued patency of that stent. He has been noted to have severe right renal artery stenosis. This is been treated medically in the setting of well controlled blood pressure and stable renal function. He also was incidentally noted to have a pericardial effusion and this has been followed by serial echocardiography with no change over time and no evidence of hemodynamic sequelae.  He is here alone today. He doesn't have nearly as much energy or strength as he has in the past. He complains of generalized weakness. Denies shortness of breath. No lightheadedness or syncope. No palpitations. He complains of mild ankle swelling.   Past Medical History:  Diagnosis Date  . AAA (abdominal aortic aneurysm) (HCC)    Infrarenal AAA - maximal diameter of 3.5 cm   . Atherosclerotic renal artery stenosis, bilateral (HCC)    Bilateral renal angiography 11/27/11, Right 70% prox, Left 90% prox s/p LRA PTA/Stenting - 6.0x56m ;  12/12/2011 Renal Duplex: Patent LRA stent & > 60% RRA ost stenosis  . Blood transfusion    S/P "lost blood due to hemorrhoids"  . Dysrhythmia    "fast one time"  . Hemorrhoids   . High cholesterol   . HTN (hypertension)   . Kidney disease, chronic, stage III (moderate, EGFR 30-59 ml/min) (HCC)   . Pericardial effusion    a. incidentally noted on renal u/s 11/2011;  b.  12/16/2011 Echo:  EF 55-60%, Gr1 DD, Mild MR, PASP 32, Mod circumferential peric effusion w/o hemodynamic compromise. Density in pericardial space - consider MRI  . Type II diabetes  mellitus [place]     Past Surgical History:  Procedure Laterality Date  . ABDOMINAL ANGIOGRAM N/A 11/27/2011   Procedure: ABDOMINAL ANGIOGRAM;  Surgeon: MSherren Mocha MD;  [facility];  Service: Cardiovascular;  Laterality: N/A;  . CATARACT EXTRACTION W/ INTRAOCULAR LENS  IMPLANT, BILATERAL  1990's  . NASAL SEPTUM SURGERY  1980's  . RENAL ANGIOGRAM Bilateral 11/27/2011   Procedure: RENAL ANGIOGRAM;  Surgeon: MSherren Mocha MD;  [facility];  Service: Cardiovascular;  Laterality: Bilateral;  possible PTA/stent  . RENAL ARTERY STENT  11/27/11    Current Outpatient Prescriptions  Medication Sig Dispense Refill  . amLODipine (NORVASC) 5 MG tablet Take 5 mg by mouth daily.    .Marland Kitchenaspirin EC 81 MG tablet Take 81 mg by mouth daily.    . carvedilol (COREG) 12.5 MG tablet Take 12.5 mg by mouth 2 (two) times daily with a meal.     . Cholecalciferol (VITAMIN D3) 2000 UNITS TABS Take 2,000 mg by mouth daily.     . fenofibrate 160 MG tablet Take 160 mg by mouth daily.    . furosemide (LASIX) 20 MG tablet Take 20 mg by mouth daily.    . rosuvastatin (CRESTOR) 40 MG tablet Take 40 mg by mouth daily.    . tamsulosin (FLOMAX) 0.4 MG CAPS capsule Take 0.4 mg by mouth daily after breakfast.      No current facility-administered medications for this visit.  Allergies:   Patient has no known allergies.   Social History:  The patient  reports that he quit smoking about 20 years ago. His smoking use included Pipe and Cigarettes. He quit after 65.00 years of use. He has never used smokeless tobacco. He reports that he drinks about 8.4 oz of alcohol per week . He reports that he does not use drugs.   Family History:  The patient's  family history includes Hypertension in his brother and mother.    ROS:  Please see the history of present illness.  Otherwise, review of systems is positive for fatigue.  All other systems are reviewed and negative.    PHYSICAL EXAM: VS:  BP (!) 164/60    Pulse 68   Ht _0  (1.753 m)   Wt 60.5 kg (133 lb 6.4 oz)   BMI 19.70 kg/m  , BMI Body mass index is 19.7 kg/m. GEN: Pleasant elderly male, in no acute distress  HEENT: normal  Neck: no JVD, no masses. bilateral carotid bruits Cardiac: RRR with 2/6 systolic murmur at the RUSB             Respiratory:  clear to auscultation bilaterally, normal work of breathing GI: soft, nontender, nondistended, + BS MS: no deformity or atrophy  Ext: no pretibial edema Skin: warm and dry, no rash Neuro:  Strength and sensation are intact Psych: euthymic mood, full affect  EKG:  EKG is ordered today. The ekg ordered today shows NSR 68 bpm, PAC's, RBBB  Recent Labs: No results found for requested labs within last 8760 hours.   Lipid Panel  No results found for: CHOL, TRIG, HDL, CHOLHDL, VLDL, LDLCALC, LDLDIRECT    Wt Readings from Last 3 Encounters:  05/19/17 60.5 kg (133 lb 6.4 oz)  03/21/16 61.2 kg (134 lb 14.4 oz)  02/14/15 61 kg (134 lb 6.4 oz)     Cardiac Studies Reviewed: Renal arterial duplex 04-02-2017: Impressions Stable infrarenal fusiform AAA measuring 3.4 cm x 3.4 cm.  >50% stenosis in the left common iliac artery. Stable >60% right renal artery stenosis.  Stable normal left renal artery, s/p left renal artery stent.  Patient IVC and renal veins.  F/u 1 year  Echo 05-19-2018: Study Conclusions  - Left ventricle: The cavity size was normal. Wall thickness was   increased in a pattern of moderate LVH. Systolic function was   normal. The estimated ejection fraction was in the range of 60%   to 65%. Doppler parameters are consistent with abnormal left   ventricular relaxation (grade 1 diastolic dysfunction). - Mitral valve: Moderately to severely calcified annulus. Mildly   thickened leaflets . - Right ventricle: Systolic function was mildly reduced. - Pericardium, extracardiac: Moderate pericardial effusion   surrounds heart. Most prominent around RA,  RV  Impressions:  - Compared to echo from 2017 pericardial effusion is smaller  ------------------------------------------------------------------- Study data:  Comparison was made to the study of 03/07/2016.  Study status:  Routine.  Procedure:  The patient reported no pain pre or post test. Transthoracic echocardiography. Image quality was adequate.  Study completion:  There were no complications. Transthoracic echocardiography.  M-mode, complete 2D, spectral Doppler, and color Doppler.  Birthdate:  Patient birthdate: 1929/09/30.  Age:  Patient is 83 yr old.  Sex:  Gender: male. BMI: 19.9 kg/m^2.  Blood pressure:     152/79  Patient status: Outpatient.  Study date:  Study date: 05/19/2017. Study time: 01:59 PM.  Location:  [facility]  -------------------------------------------------------------------  -------------------------------------------------------------------  Left ventricle:  The cavity size was normal. Wall thickness was increased in a pattern of moderate LVH. Systolic function was normal. The estimated ejection fraction was in the range of 60% to 65%. Doppler parameters are consistent with abnormal left ventricular relaxation (grade 1 diastolic dysfunction).  ------------------------------------------------------------------- Aortic valve:   Mildly thickened, moderately calcified leaflets. Doppler:  There was no regurgitation.  ------------------------------------------------------------------- Mitral valve:   Moderately to severely calcified annulus. Mildly thickened leaflets .  Doppler:  There was trivial regurgitation. Peak gradient (D): 3 mm Hg.  ------------------------------------------------------------------- Left atrium:  The atrium was normal in size.  ------------------------------------------------------------------- Right ventricle:  The cavity size was normal. Wall thickness was normal. Systolic function was mildly  reduced.  ------------------------------------------------------------------- Pulmonic valve:    Structurally normal valve.   Cusp separation was normal.  Doppler:  Transvalvular velocity was within the normal range. There was no regurgitation.  ------------------------------------------------------------------- Tricuspid valve:   Structurally normal valve.   Leaflet separation was normal.  Doppler:  Transvalvular velocity was within the normal range. There was trivial regurgitation.  ------------------------------------------------------------------- Right atrium:  The atrium was normal in size.  ------------------------------------------------------------------- Pericardium:  Moderate pericardial effusion surrounds heart. Most prominent around RA, RV  ------------------------------------------------------------------- Systemic veins: Inferior vena cava: The vessel was normal in size. The respirophasic diameter changes were in the normal range (>= 50%), consistent with normal central venous pressure.  ASSESSMENT AND PLAN: 1.  Chronic pericardial effusion: reviewed images from today's echo study. Formal interpretation is pending. I compared today's images to last year's study and do not appreciate any significant changes. No signs of RA/RV collapse. Pt remains asymptomatic. repeat echo one year with a FU visit at that time.   2. Renal arterial Stenosis with CKD 3/HTN: recent arterial doppler demonstrates continued patency at the stent site. Severe stenosis of the contralateral renal artery is unchanged. Stable BP control and stable renal function. Continue medical management, FU with Dr Jimmy Footman.   3. AAA: small, stable at 3.4 cm maximal diameter.  4. HTN: BP controlled  5. Hyperlipidemia: continue crestor 40 mg daily.  Current medicines are reviewed with the patient today.  The patient does not have concerns regarding medicines.  Labs/ tests ordered today include:  No  orders of the defined types were placed in this encounter.  Disposition:   FU one year with an echo  Signed, Sherren Mocha, MD  05/19/2017 3:18 PM    [address], [place], [place]  [address] Phone: [phone]; Fax: [phone]

## 2017-05-29 DIAGNOSIS — C44229 Squamous cell carcinoma of skin of left ear and external auricular canal: Secondary | ICD-10-CM | POA: Diagnosis not present

## 2017-08-17 DIAGNOSIS — N390 Urinary tract infection, site not specified: Secondary | ICD-10-CM | POA: Diagnosis not present

## 2017-08-17 DIAGNOSIS — R350 Frequency of micturition: Secondary | ICD-10-CM | POA: Diagnosis not present

## 2017-08-18 ENCOUNTER — Encounter (HOSPITAL_COMMUNITY): Payer: Self-pay

## 2017-08-18 ENCOUNTER — Emergency Department (HOSPITAL_COMMUNITY)
Admission: EM | Admit: 2017-08-18 | Discharge: 2017-08-18 | Disposition: A | Discharge status: Home / Self Care | Payer: MEDICARE | Attending: Emergency Medicine | Admitting: Emergency Medicine

## 2017-08-18 ENCOUNTER — Emergency Department (HOSPITAL_COMMUNITY)
Admission: EM | Admit: 2017-08-18 | Discharge: 2017-08-18 | Disposition: A | Discharge status: Home / Self Care | Payer: MEDICARE | Source: Home / Self Care

## 2017-08-18 ENCOUNTER — Emergency Department (HOSPITAL_COMMUNITY): Payer: MEDICARE

## 2017-08-18 DIAGNOSIS — Z7982 Long term (current) use of aspirin: Secondary | ICD-10-CM | POA: Diagnosis not present

## 2017-08-18 DIAGNOSIS — N183 Chronic kidney disease, stage 3 (moderate): Secondary | ICD-10-CM | POA: Insufficient documentation

## 2017-08-18 DIAGNOSIS — R300 Dysuria: Secondary | ICD-10-CM

## 2017-08-18 DIAGNOSIS — I129 Hypertensive chronic kidney disease with stage 1 through stage 4 chronic kidney disease, or unspecified chronic kidney disease: Secondary | ICD-10-CM | POA: Insufficient documentation

## 2017-08-18 DIAGNOSIS — R109 Unspecified abdominal pain: Secondary | ICD-10-CM | POA: Insufficient documentation

## 2017-08-18 DIAGNOSIS — K5790 Diverticulosis of intestine, part unspecified, without perforation or abscess without bleeding: Secondary | ICD-10-CM | POA: Diagnosis not present

## 2017-08-18 DIAGNOSIS — Z87891 Personal history of nicotine dependence: Secondary | ICD-10-CM | POA: Insufficient documentation

## 2017-08-18 DIAGNOSIS — N390 Urinary tract infection, site not specified: Secondary | ICD-10-CM

## 2017-08-18 DIAGNOSIS — R309 Painful micturition, unspecified: Secondary | ICD-10-CM | POA: Diagnosis present

## 2017-08-18 DIAGNOSIS — E119 Type 2 diabetes mellitus without complications: Secondary | ICD-10-CM | POA: Insufficient documentation

## 2017-08-18 DIAGNOSIS — Z5321 Procedure and treatment not carried out due to patient leaving prior to being seen by health care provider: Secondary | ICD-10-CM

## 2017-08-18 DIAGNOSIS — Z79899 Other long term (current) drug therapy: Secondary | ICD-10-CM | POA: Insufficient documentation

## 2017-08-18 LAB — URINALYSIS, ROUTINE W REFLEX MICROSCOPIC
BILIRUBIN URINE: NEGATIVE
Bilirubin Urine: NEGATIVE
Glucose, UA: NEGATIVE mg/dL
Glucose, UA: NEGATIVE mg/dL
HGB URINE DIPSTICK: NEGATIVE
HGB URINE DIPSTICK: NEGATIVE
Ketones, ur: NEGATIVE mg/dL
Ketones, ur: 5 mg/dL — AB
NITRITE: NEGATIVE
Nitrite: NEGATIVE
PROTEIN: NEGATIVE mg/dL
Protein, ur: NEGATIVE mg/dL
Specific Gravity, Urine: 1.01 (ref 1.005–1.030)
Specific Gravity, Urine: 1.015 (ref 1.005–1.030)
Squamous Epithelial / LPF: NONE SEEN
pH: 7 (ref 5.0–8.0)
pH: 5 (ref 5.0–8.0)

## 2017-08-18 LAB — BASIC METABOLIC PANEL
ANION GAP: 10 (ref 5–15)
BUN: 30 mg/dL — ABNORMAL HIGH (ref 6–20)
CALCIUM: 8.5 mg/dL — AB (ref 8.9–10.3)
CHLORIDE: 107 mmol/L (ref 101–111)
CO2: 22 mmol/L (ref 22–32)
Creatinine, Ser: 1.71 mg/dL — ABNORMAL HIGH (ref 0.61–1.24)
GFR calc non Af Amer: 35 mL/min — ABNORMAL LOW (ref >60)
GFR, EST AFRICAN AMERICAN: 40 mL/min — AB (ref >60)
Glucose, Bld: 113 mg/dL — ABNORMAL HIGH (ref 65–99)
POTASSIUM: 3.1 mmol/L — AB (ref 3.5–5.1)
Sodium: 139 mmol/L (ref 135–145)

## 2017-08-18 LAB — CBC WITH DIFFERENTIAL/PLATELET
BASOS ABS: 0 10*3/uL (ref 0.0–0.1)
BASOS PCT: 0 %
Eosinophils Absolute: 0 10*3/uL (ref 0.0–0.7)
Eosinophils Relative: 0 %
HEMATOCRIT: 32.8 % — AB (ref 39.0–52.0)
HEMOGLOBIN: 10.9 g/dL — AB (ref 13.0–17.0)
LYMPHS PCT: 13 %
Lymphs Abs: 1.9 10*3/uL (ref 0.7–4.0)
MCH: 28.5 pg (ref 26.0–34.0)
MCHC: 33.2 g/dL (ref 30.0–36.0)
MCV: 85.6 fL (ref 78.0–100.0)
Monocytes Absolute: 1.4 10*3/uL — ABNORMAL HIGH (ref 0.1–1.0)
Monocytes Relative: 9 %
NEUTROS ABS: 11.6 10*3/uL — AB (ref 1.7–7.7)
NEUTROS PCT: 78 %
Platelets: 117 10*3/uL — ABNORMAL LOW (ref 150–400)
RBC: 3.83 MIL/uL — AB (ref 4.22–5.81)
RDW: 15.3 % (ref 11.5–15.5)
WBC: 14.9 10*3/uL — ABNORMAL HIGH (ref 4.0–10.5)

## 2017-08-18 MED ORDER — SULFAMETHOXAZOLE-TRIMETHOPRIM 800-160 MG PO TABS
1.0000 | ORAL_TABLET | Freq: Once | ORAL | Status: DC
  Filled 2017-08-18: qty 1

## 2017-08-18 MED ORDER — DEXTROSE 5 % IV SOLN
1.0000 g | Freq: Once | INTRAVENOUS | Status: AC
  Administered 2017-08-18: 1 g via INTRAVENOUS
  Filled 2017-08-18: qty 10

## 2017-08-18 NOTE — ED Triage Notes (Signed)
Pt presents for evaluation of dysuria and difficulty urinating x 2-3days

## 2017-08-18 NOTE — ED Notes (Signed)
Pt's family member stated that pt is going to [facility].

## 2017-08-18 NOTE — ED Provider Notes (Signed)
[facility] DEPT Provider Note   CSN: [id number] Arrival date & time: 08/18/17  1230     History   Chief Complaint Chief Complaint  Patient presents with  . Urinary Tract Infection  . Weakness    HPI Chris Peterson is a 83 y.o. male.  Pt reports he began having burning with urination 2 days ago.  Pt was seen at Urgent care.  Pt was told he had an elevated wbc count and a urinary tract infection.  Pt was started on Bactrim.  Pt called Urology today and was told to come to the ED.  Pt has a history of kidney disease.  Pt denies any history of prostate disease of urinary tract infections in the past.    The history is provided by the patient. No language interpreter was used.  Urinary Tract Infection   This is a new problem. The problem has been gradually worsening. The pain is moderate. There has been no fever. The fever has been present for 1 to 2 days. He has tried nothing for the symptoms. His past medical history does not include recurrent UTIs.    Past Medical History:  Diagnosis Date  . AAA (abdominal aortic aneurysm) (HCC)    Infrarenal AAA - maximal diameter of 3.5 cm   . Atherosclerotic renal artery stenosis, bilateral (HCC)    Bilateral renal angiography 11/27/11, Right 70% prox, Left 90% prox s/p LRA PTA/Stenting - 6.0x71m ;  12/12/2011 Renal Duplex: Patent LRA stent & > 60% RRA ost stenosis  . Blood transfusion    S/P "lost blood due to hemorrhoids"  . Dysrhythmia    "fast one time"  . Hemorrhoids   . High cholesterol   . HTN (hypertension)   . Kidney disease, chronic, stage III (moderate, EGFR 30-59 ml/min) (HCC)   . Pericardial effusion    a. incidentally noted on renal u/s 11/2011;  b.  12/16/2011 Echo:  EF 55-60%, Gr1 DD, Mild MR, PASP 32, Mod circumferential peric effusion w/o hemodynamic compromise. Density in pericardial space - consider MRI  . Type II diabetes mellitus ([facility]     Patient Active Problem List   Diagnosis  Date Noted  . Pericardial effusion   . Atherosclerotic [place] artery stenosis, bilateral ([place]   . HTN (hypertension) 11/28/2011  . AAA [place]abdominal aortic aneurysm) ([place] 11/28/2011  . CKD (chronic kidney disease) 11/28/2011  . Diabetes mellitus type II 11/28/2011  . Tobacco abuse 11/28/2011    Past Surgical History:  Procedure Laterality Date  . ABDOMINAL ANGIOGRAM N/A 11/27/2011   Procedure: ABDOMINAL ANGIOGRAM;  Surgeon: MSherren Mocha MD;  [facility];  Service: Cardiovascular;  Laterality: N/A;  . CATARACT EXTRACTION W/ INTRAOCULAR LENS  IMPLANT, BILATERAL  1990's  . NASAL SEPTUM SURGERY  1980's  . RENAL ANGIOGRAM Bilateral 11/27/2011   Procedure: RENAL ANGIOGRAM;  Surgeon: MSherren Mocha MD;  [facility];  Service: Cardiovascular;  Laterality: Bilateral;  possible PTA/stent  . RENAL ARTERY STENT  11/27/11       Home Medications    Prior to Admission medications   Medication Sig Start Date End Date Taking? Authorizing Provider  amLODipine (NORVASC) 5 MG tablet Take 5 mg by mouth daily. 02/20/16  Yes [provider]  aspirin EC 81 MG tablet Take 81 mg by mouth daily.   Yes [provider]  carvedilol (COREG) 12.5 MG tablet Take 12.5 mg by mouth 2 (two) times daily with a meal.    Yes [provider]  Cholecalciferol (VITAMIN D3) 2000 UNITS TABS Take 2,000 mg by mouth daily.    Yes [provider]  fenofibrate 160 MG tablet Take 160 mg by mouth daily. 08/31/15  Yes [provider]  furosemide (LASIX) 20 MG tablet Take 20 mg by mouth daily.   Yes [provider]  rosuvastatin (CRESTOR) 40 MG tablet Take 40 mg by mouth daily.   Yes [provider]  sulfamethoxazole-trimethoprim (BACTRIM DS,SEPTRA DS) 800-160 MG tablet Take 1 tablet by mouth 2 (two) times daily.  08/17/17  Yes [provider]  tamsulosin (FLOMAX) 0.4 MG CAPS capsule Take 0.4 mg by mouth daily after breakfast.  10/28/14  Yes  [provider]    Family History Family History  Problem Relation Age of Onset  . Hypertension Mother   . Hypertension Brother     Social History Social History   Tobacco Use  . Smoking status: Former Smoker    Years: 65.00    Types: Pipe, Cigarettes    Last attempt to quit: 08/19/1996    Years since quitting: 21.0  . Smokeless tobacco: Never Used  . Tobacco comment: 11/27/11 "smoked pipe since 1998; quit cigarettes 1998"  Substance Use Topics  . Alcohol use: Yes    Alcohol/week: 8.4 oz    Types: 14 Shots of liquor per week    Comment: 11/27/11 "bourbon"  . Drug use: No     Allergies   Patient has no known allergies.   Review of Systems Review of Systems  All other systems reviewed and are negative.    Physical Exam Updated Vital Signs BP 132/67 (BP Location: Left Arm)   Pulse 68   Temp 98.2 F (36.8 C) (Oral)   Resp 18   SpO2 94%   Physical Exam  Constitutional: He appears well-developed and well-nourished.  HENT:  Head: Normocephalic.  Eyes: Pupils are equal, round, and reactive to light.  Cardiovascular: Normal rate and regular rhythm.  Pulmonary/Chest: Effort normal and breath sounds normal.  Abdominal: Soft. Bowel sounds are normal.  Musculoskeletal: Normal range of motion.  Neurological: He is alert.  Skin: Skin is warm.  Psychiatric: He has a normal mood and affect.  Nursing note and vitals reviewed.    ED Treatments / Results  Labs (all labs ordered are listed, but only abnormal results are displayed) Labs Reviewed  URINALYSIS, ROUTINE W REFLEX MICROSCOPIC - Abnormal; Notable for the following components:      Result Value   APPearance HAZY (*)    Ketones, ur 5 (*)    Leukocytes, UA LARGE (*)    Bacteria, UA RARE (*)    Squamous Epithelial / LPF 0-5 (*)    Non Squamous Epithelial 0-5 (*)    All other components within normal limits  CBC WITH DIFFERENTIAL/PLATELET - Abnormal; Notable for the following components:   WBC 14.9  (*)    RBC 3.83 (*)    Hemoglobin 10.9 (*)    HCT 32.8 (*)    Platelets 117 (*)    Neutro Abs 11.6 (*)    Monocytes Absolute 1.4 (*)    All other components within normal limits  BASIC METABOLIC PANEL - Abnormal; Notable for the following components:   Potassium 3.1 (*)    Glucose, Bld 113 (*)    BUN 30 (*)    Creatinine, Ser 1.71 (*)    Calcium 8.5 (*)    GFR calc non Af Amer 35 (*)    GFR calc Af Wyvonnia Lora  40 (*)    All other components within normal limits    EKG  EKG Interpretation None       Radiology Ct Renal Stone Study  Result Date: 08/18/2017 CLINICAL DATA:  Difficulty urinating. Burning with urination. Flank pain. EXAM: CT ABDOMEN AND PELVIS WITHOUT CONTRAST TECHNIQUE: Multidetector CT imaging of the abdomen and pelvis was performed following the standard protocol without IV contrast. COMPARISON:  None. FINDINGS: Lower chest: Lung bases clear .Small pericardial fluid. Hepatobiliary: No focal hepatic lesion on noncontrast exam. Normal gallbladder Pancreas: Pancreas is normal. No ductal dilatation. No pancreatic inflammation. Spleen: Normal spleen Adrenals/urinary tract: Adrenal glands normal. No nephrolithiasis. The LEFT kidney is larger than the RIGHT kidney. No ureteral obstruction. No ureterolithiasis. Bladder normal Stomach/Bowel: Stomach, small bowel, appendix, and cecum are normal. Diverticular the descending colon sigmoid colon without acute inflammation. Vascular/Lymphatic: Abdominal aorta is normal caliber with atherosclerotic calcification. There is no retroperitoneal or periportal lymphadenopathy. No pelvic lymphadenopathy. There is aneurysmal dilatation of the abdominal aorta to 4.1 cm Reproductive: Prostate normal Other: No free fluid. Musculoskeletal: No aggressive osseous lesion. IMPRESSION: 1. No renal obstruction.  No nephrolithiasis ureterolithiasis. 2. Small pericardial effusion. 3. Diverticulosis without diverticulitis. 4.  Atherosclerotic calcification of the  aorta. 5. Aneurysmal dilatation of the abdominal aorta to 3.8 cm. Recommend followup by ultrasound in 2 years. This recommendation follows ACR consensus guidelines: White Paper of the ACR Incidental Findings Committee II on Vascular Findings. J Am Coll Radiol 2013; 10:789-794. Electronically Signed   By: Suzy Bouchard M.D.   On: 08/18/2017 15:35    Procedures Procedures (including critical care time)  Medications Ordered in ED Medications  cefTRIAXone (ROCEPHIN) 1 g in dextrose 5 % 50 mL IVPB (not administered)     Initial Impression / Assessment and Plan / ED Course  I have reviewed the triage vital signs and the nursing notes.  Pertinent labs & imaging results that were available during my care of the patient were reviewed by me and considered in my medical decision making (see chart for details).     Ct scan reviewed,  Urine culture ordered.  I will give pt Rocephin here and make sure culture covers    Final Clinical Impressions(s) / ED Diagnoses   Final diagnoses:  Urinary tract infection without hematuria, site unspecified    ED Discharge Orders    None       Sidney Ace 08/18/17 1631    Daleen Bo, MD 08/18/17 [phone]

## 2017-08-18 NOTE — ED Triage Notes (Signed)
Patient went to an UC yesterday and was diagnosed with a UTI and was prescribed an antibiotic. Patient is c/o weakness. Patient went to [facility] ED via ambulance and received NS prior to arrival to the ED. Patient's daughter reports that the patient has not had a "good flow " of urine x 3 days.

## 2017-08-18 NOTE — ED Notes (Signed)
Bed: WA10 Expected date:  Expected time:  Means of arrival:  Comments: Triage 1   

## 2017-08-18 NOTE — Discharge Instructions (Signed)
Schedule appointment to see your Urologist  for recheck.

## 2017-08-20 LAB — URINE CULTURE: Culture: <10000 — AB

## 2017-09-29 DIAGNOSIS — N183 Chronic kidney disease, stage 3 (moderate): Secondary | ICD-10-CM | POA: Diagnosis not present

## 2017-10-02 DIAGNOSIS — L570 Actinic keratosis: Secondary | ICD-10-CM | POA: Diagnosis not present

## 2017-10-02 DIAGNOSIS — L819 Disorder of pigmentation, unspecified: Secondary | ICD-10-CM | POA: Diagnosis not present

## 2017-10-02 DIAGNOSIS — Z85828 Personal history of other malignant neoplasm of skin: Secondary | ICD-10-CM | POA: Diagnosis not present

## 2017-10-11 DIAGNOSIS — R300 Dysuria: Secondary | ICD-10-CM | POA: Diagnosis not present

## 2017-10-11 DIAGNOSIS — D649 Anemia, unspecified: Secondary | ICD-10-CM | POA: Diagnosis not present

## 2017-10-11 DIAGNOSIS — N390 Urinary tract infection, site not specified: Secondary | ICD-10-CM | POA: Diagnosis not present

## 2017-12-08 DIAGNOSIS — I129 Hypertensive chronic kidney disease with stage 1 through stage 4 chronic kidney disease, or unspecified chronic kidney disease: Secondary | ICD-10-CM | POA: Diagnosis not present

## 2017-12-08 DIAGNOSIS — E1122 Type 2 diabetes mellitus with diabetic chronic kidney disease: Secondary | ICD-10-CM | POA: Diagnosis not present

## 2017-12-08 DIAGNOSIS — D631 Anemia in chronic kidney disease: Secondary | ICD-10-CM | POA: Diagnosis not present

## 2017-12-08 DIAGNOSIS — E785 Hyperlipidemia, unspecified: Secondary | ICD-10-CM | POA: Diagnosis not present

## 2017-12-08 DIAGNOSIS — N183 Chronic kidney disease, stage 3 (moderate): Secondary | ICD-10-CM | POA: Diagnosis not present

## 2017-12-08 DIAGNOSIS — N390 Urinary tract infection, site not specified: Secondary | ICD-10-CM | POA: Diagnosis not present

## 2017-12-08 DIAGNOSIS — N2581 Secondary hyperparathyroidism of renal origin: Secondary | ICD-10-CM | POA: Diagnosis not present

## 2017-12-08 DIAGNOSIS — I701 Atherosclerosis of renal artery: Secondary | ICD-10-CM | POA: Diagnosis not present

## 2017-12-08 DIAGNOSIS — I739 Peripheral vascular disease, unspecified: Secondary | ICD-10-CM | POA: Diagnosis not present

## 2017-12-08 DIAGNOSIS — N400 Enlarged prostate without lower urinary tract symptoms: Secondary | ICD-10-CM | POA: Diagnosis not present

## 2017-12-08 DIAGNOSIS — Z720 Tobacco use: Secondary | ICD-10-CM | POA: Diagnosis not present

## 2017-12-08 DIAGNOSIS — Z0000 Encounter for general adult medical examination without abnormal findings: Secondary | ICD-10-CM | POA: Diagnosis not present

## 2018-01-01 DIAGNOSIS — N390 Urinary tract infection, site not specified: Secondary | ICD-10-CM | POA: Diagnosis not present

## 2018-01-26 DIAGNOSIS — H43813 Vitreous degeneration, bilateral: Secondary | ICD-10-CM | POA: Diagnosis not present

## 2018-01-26 DIAGNOSIS — Z961 Presence of intraocular lens: Secondary | ICD-10-CM | POA: Diagnosis not present

## 2018-01-26 DIAGNOSIS — H31093 Other chorioretinal scars, bilateral: Secondary | ICD-10-CM | POA: Diagnosis not present

## 2018-01-27 ENCOUNTER — Other Ambulatory Visit: Payer: Self-pay

## 2018-01-27 DIAGNOSIS — I3139 Other pericardial effusion (noninflammatory): Secondary | ICD-10-CM

## 2018-01-27 DIAGNOSIS — I313 Pericardial effusion (noninflammatory): Secondary | ICD-10-CM

## 2018-02-10 DIAGNOSIS — R350 Frequency of micturition: Secondary | ICD-10-CM | POA: Diagnosis not present

## 2018-03-09 DIAGNOSIS — R350 Frequency of micturition: Secondary | ICD-10-CM | POA: Diagnosis not present

## 2018-03-09 DIAGNOSIS — Z1389 Encounter for screening for other disorder: Secondary | ICD-10-CM | POA: Diagnosis not present

## 2018-03-09 DIAGNOSIS — N390 Urinary tract infection, site not specified: Secondary | ICD-10-CM | POA: Diagnosis not present

## 2018-03-09 DIAGNOSIS — I10 Essential (primary) hypertension: Secondary | ICD-10-CM | POA: Diagnosis not present

## 2018-03-09 DIAGNOSIS — E782 Mixed hyperlipidemia: Secondary | ICD-10-CM | POA: Diagnosis not present

## 2018-03-09 DIAGNOSIS — Z0000 Encounter for general adult medical examination without abnormal findings: Secondary | ICD-10-CM | POA: Diagnosis not present

## 2018-03-09 DIAGNOSIS — E1169 Type 2 diabetes mellitus with other specified complication: Secondary | ICD-10-CM | POA: Diagnosis not present

## 2018-03-09 DIAGNOSIS — R609 Edema, unspecified: Secondary | ICD-10-CM | POA: Diagnosis not present

## 2018-03-09 DIAGNOSIS — Z125 Encounter for screening for malignant neoplasm of prostate: Secondary | ICD-10-CM | POA: Diagnosis not present

## 2018-03-09 DIAGNOSIS — R531 Weakness: Secondary | ICD-10-CM | POA: Diagnosis not present

## 2018-03-09 DIAGNOSIS — I714 Abdominal aortic aneurysm, without rupture: Secondary | ICD-10-CM | POA: Diagnosis not present

## 2018-03-09 DIAGNOSIS — R5381 Other malaise: Secondary | ICD-10-CM | POA: Diagnosis not present

## 2018-03-09 DIAGNOSIS — N183 Chronic kidney disease, stage 3 (moderate): Secondary | ICD-10-CM | POA: Diagnosis not present

## 2018-03-12 ENCOUNTER — Other Ambulatory Visit: Payer: Self-pay | Admitting: Cardiovascular Disease

## 2018-03-12 DIAGNOSIS — I714 Abdominal aortic aneurysm, without rupture, unspecified: Secondary | ICD-10-CM

## 2018-03-13 ENCOUNTER — Inpatient Hospital Stay (HOSPITAL_COMMUNITY)
Admission: EM | Admit: 2018-03-13 | Discharge: 2018-03-15 | DRG: 378 | Disposition: A | Discharge status: Home / Self Care | Payer: MEDICARE | Attending: Internal Medicine | Admitting: Internal Medicine

## 2018-03-13 ENCOUNTER — Encounter (HOSPITAL_COMMUNITY): Payer: Self-pay

## 2018-03-13 ENCOUNTER — Encounter (HOSPITAL_COMMUNITY)
Admission: EM | Disposition: A | Discharge status: Home / Self Care | Payer: Self-pay | Source: Home / Self Care | Attending: Internal Medicine

## 2018-03-13 ENCOUNTER — Other Ambulatory Visit: Payer: Self-pay

## 2018-03-13 DIAGNOSIS — N400 Enlarged prostate without lower urinary tract symptoms: Secondary | ICD-10-CM | POA: Diagnosis present

## 2018-03-13 DIAGNOSIS — K921 Melena: Secondary | ICD-10-CM

## 2018-03-13 DIAGNOSIS — K922 Gastrointestinal hemorrhage, unspecified: Secondary | ICD-10-CM | POA: Diagnosis present

## 2018-03-13 DIAGNOSIS — F17290 Nicotine dependence, other tobacco product, uncomplicated: Secondary | ICD-10-CM | POA: Diagnosis present

## 2018-03-13 DIAGNOSIS — K3189 Other diseases of stomach and duodenum: Secondary | ICD-10-CM | POA: Diagnosis not present

## 2018-03-13 DIAGNOSIS — K449 Diaphragmatic hernia without obstruction or gangrene: Secondary | ICD-10-CM | POA: Diagnosis present

## 2018-03-13 DIAGNOSIS — Z7982 Long term (current) use of aspirin: Secondary | ICD-10-CM | POA: Diagnosis not present

## 2018-03-13 DIAGNOSIS — K319 Disease of stomach and duodenum, unspecified: Secondary | ICD-10-CM | POA: Diagnosis present

## 2018-03-13 DIAGNOSIS — I10 Essential (primary) hypertension: Secondary | ICD-10-CM

## 2018-03-13 DIAGNOSIS — E1122 Type 2 diabetes mellitus with diabetic chronic kidney disease: Secondary | ICD-10-CM | POA: Diagnosis present

## 2018-03-13 DIAGNOSIS — D62 Acute posthemorrhagic anemia: Secondary | ICD-10-CM | POA: Diagnosis present

## 2018-03-13 DIAGNOSIS — Z79899 Other long term (current) drug therapy: Secondary | ICD-10-CM | POA: Diagnosis not present

## 2018-03-13 DIAGNOSIS — I714 Abdominal aortic aneurysm, without rupture, unspecified: Secondary | ICD-10-CM | POA: Diagnosis present

## 2018-03-13 DIAGNOSIS — I701 Atherosclerosis of renal artery: Secondary | ICD-10-CM | POA: Diagnosis present

## 2018-03-13 DIAGNOSIS — Z8679 Personal history of other diseases of the circulatory system: Secondary | ICD-10-CM

## 2018-03-13 DIAGNOSIS — E7800 Pure hypercholesterolemia, unspecified: Secondary | ICD-10-CM | POA: Diagnosis present

## 2018-03-13 DIAGNOSIS — Z960 Presence of urogenital implants: Secondary | ICD-10-CM | POA: Diagnosis present

## 2018-03-13 DIAGNOSIS — R531 Weakness: Secondary | ICD-10-CM | POA: Diagnosis not present

## 2018-03-13 DIAGNOSIS — E876 Hypokalemia: Secondary | ICD-10-CM | POA: Diagnosis present

## 2018-03-13 DIAGNOSIS — R42 Dizziness and giddiness: Secondary | ICD-10-CM | POA: Diagnosis not present

## 2018-03-13 DIAGNOSIS — N183 Chronic kidney disease, stage 3 (moderate): Secondary | ICD-10-CM | POA: Diagnosis present

## 2018-03-13 DIAGNOSIS — K264 Chronic or unspecified duodenal ulcer with hemorrhage: Secondary | ICD-10-CM

## 2018-03-13 DIAGNOSIS — I129 Hypertensive chronic kidney disease with stage 1 through stage 4 chronic kidney disease, or unspecified chronic kidney disease: Secondary | ICD-10-CM | POA: Diagnosis present

## 2018-03-13 DIAGNOSIS — K254 Chronic or unspecified gastric ulcer with hemorrhage: Secondary | ICD-10-CM | POA: Diagnosis not present

## 2018-03-13 DIAGNOSIS — K250 Acute gastric ulcer with hemorrhage: Secondary | ICD-10-CM | POA: Diagnosis present

## 2018-03-13 DIAGNOSIS — N189 Chronic kidney disease, unspecified: Secondary | ICD-10-CM | POA: Diagnosis present

## 2018-03-13 HISTORY — PX: HOT HEMOSTASIS: SHX5433

## 2018-03-13 HISTORY — PX: ESOPHAGOGASTRODUODENOSCOPY: SHX5428

## 2018-03-13 HISTORY — PX: BIOPSY: SHX5522

## 2018-03-13 LAB — I-STAT CHEM 8, ED
BUN: 78 mg/dL — ABNORMAL HIGH (ref 8–23)
CALCIUM ION: 1.18 mmol/L (ref 1.15–1.40)
CREATININE: 1.5 mg/dL — AB (ref 0.61–1.24)
Chloride: 106 mmol/L (ref 98–111)
GLUCOSE: 137 mg/dL — AB (ref 70–99)
HEMATOCRIT: 22 % — AB (ref 39.0–52.0)
HEMOGLOBIN: 7.5 g/dL — AB (ref 13.0–17.0)
Potassium: 3.8 mmol/L (ref 3.5–5.1)
Sodium: 142 mmol/L (ref 135–145)
TCO2: 25 mmol/L (ref 22–32)

## 2018-03-13 LAB — IRON AND TIBC
IRON: 89 ug/dL (ref 45–182)
SATURATION RATIOS: 25 % (ref 17.9–39.5)
TIBC: 359 ug/dL (ref 250–450)
UIBC: 270 ug/dL

## 2018-03-13 LAB — CBC WITH DIFFERENTIAL/PLATELET
BASOS PCT: 0 %
Basophils Absolute: 0 10*3/uL (ref 0.0–0.1)
Eosinophils Absolute: 0 10*3/uL (ref 0.0–0.7)
Eosinophils Relative: 0 %
HEMATOCRIT: 23.8 % — AB (ref 39.0–52.0)
HEMOGLOBIN: 7.7 g/dL — AB (ref 13.0–17.0)
LYMPHS PCT: 33 %
Lymphs Abs: 2 10*3/uL (ref 0.7–4.0)
MCH: 27.9 pg (ref 26.0–34.0)
MCHC: 32.4 g/dL (ref 30.0–36.0)
MCV: 86.2 fL (ref 78.0–100.0)
MONOS PCT: 4 %
Monocytes Absolute: 0.2 10*3/uL (ref 0.1–1.0)
NEUTROS ABS: 3.9 10*3/uL (ref 1.7–7.7)
NEUTROS PCT: 63 %
Platelets: 135 10*3/uL — ABNORMAL LOW (ref 150–400)
RBC: 2.76 MIL/uL — ABNORMAL LOW (ref 4.22–5.81)
RDW: 16.9 % — ABNORMAL HIGH (ref 11.5–15.5)
WBC: 6.1 10*3/uL (ref 4.0–10.5)

## 2018-03-13 LAB — CBC
HEMATOCRIT: 25.2 % — AB (ref 39.0–52.0)
HEMOGLOBIN: 8.4 g/dL — AB (ref 13.0–17.0)
MCH: 28.8 pg (ref 26.0–34.0)
MCHC: 33.3 g/dL (ref 30.0–36.0)
MCV: 86.3 fL (ref 78.0–100.0)
Platelets: 121 10*3/uL — ABNORMAL LOW (ref 150–400)
RBC: 2.92 MIL/uL — AB (ref 4.22–5.81)
RDW: 16.2 % — ABNORMAL HIGH (ref 11.5–15.5)
WBC: 6.7 10*3/uL (ref 4.0–10.5)

## 2018-03-13 LAB — COMPREHENSIVE METABOLIC PANEL
ALK PHOS: 32 U/L — AB (ref 38–126)
ALT: 18 U/L (ref 0–44)
ANION GAP: 8 (ref 5–15)
AST: 36 U/L (ref 15–41)
Albumin: 3 g/dL — ABNORMAL LOW (ref 3.5–5.0)
BILIRUBIN TOTAL: 0.8 mg/dL (ref 0.3–1.2)
BUN: 80 mg/dL — ABNORMAL HIGH (ref 8–23)
CALCIUM: 9.1 mg/dL (ref 8.9–10.3)
CO2: 28 mmol/L (ref 22–32)
CREATININE: 1.61 mg/dL — AB (ref 0.61–1.24)
Chloride: 108 mmol/L (ref 98–111)
GFR calc non Af Amer: 37 mL/min — ABNORMAL LOW (ref >60)
GFR, EST AFRICAN AMERICAN: 43 mL/min — AB (ref >60)
Glucose, Bld: 145 mg/dL — ABNORMAL HIGH (ref 70–99)
Potassium: 3.8 mmol/L (ref 3.5–5.1)
Sodium: 144 mmol/L (ref 135–145)
TOTAL PROTEIN: 5.8 g/dL — AB (ref 6.5–8.1)

## 2018-03-13 LAB — PREPARE RBC (CROSSMATCH)

## 2018-03-13 LAB — FERRITIN: Ferritin: 56 ng/mL (ref 24–336)

## 2018-03-13 LAB — ABO/RH: ABO/RH(D): A POS

## 2018-03-13 LAB — FOLATE: Folate: 9.7 ng/mL (ref >5.9)

## 2018-03-13 LAB — RETICULOCYTES
RBC.: 3.02 MIL/uL — AB (ref 4.22–5.81)
RETIC COUNT ABSOLUTE: 102.7 10*3/uL (ref 19.0–186.0)
Retic Ct Pct: 3.4 % — ABNORMAL HIGH (ref 0.4–3.1)

## 2018-03-13 LAB — VITAMIN B12: VITAMIN B 12: 478 pg/mL (ref 180–914)

## 2018-03-13 LAB — POC OCCULT BLOOD, ED: Fecal Occult Bld: POSITIVE — AB

## 2018-03-13 LAB — PROTIME-INR
INR: 1.32
Prothrombin Time: 16.3 seconds — ABNORMAL HIGH (ref 11.4–15.2)

## 2018-03-13 SURGERY — EGD (ESOPHAGOGASTRODUODENOSCOPY)
Anesthesia: Moderate Sedation

## 2018-03-13 MED ORDER — AMLODIPINE BESYLATE 5 MG PO TABS
5.0000 mg | ORAL_TABLET | Freq: Every day | ORAL | Status: DC
  Administered 2018-03-14 – 2018-03-15 (×2): 5 mg via ORAL
  Filled 2018-03-13 (×2): qty 1

## 2018-03-13 MED ORDER — ACETAMINOPHEN 650 MG RE SUPP: 650.0000 mg | Freq: Four times a day (QID) | RECTAL | Status: DC | PRN

## 2018-03-13 MED ORDER — FENOFIBRATE 160 MG PO TABS
160.0000 mg | ORAL_TABLET | Freq: Every day | ORAL | Status: DC
  Administered 2018-03-14 – 2018-03-15 (×2): 160 mg via ORAL
  Filled 2018-03-13 (×2): qty 1

## 2018-03-13 MED ORDER — ROSUVASTATIN CALCIUM 10 MG PO TABS
40.0000 mg | ORAL_TABLET | Freq: Every day | ORAL | Status: DC
  Administered 2018-03-14 – 2018-03-15 (×2): 40 mg via ORAL
  Filled 2018-03-13 (×2): qty 8
  Filled 2018-03-13: qty 4

## 2018-03-13 MED ORDER — GLUCAGON HCL RDNA (DIAGNOSTIC) 1 MG IJ SOLR
INTRAMUSCULAR | Status: AC
  Filled 2018-03-13: qty 1

## 2018-03-13 MED ORDER — SODIUM CHLORIDE 0.9 % IV SOLN
INTRAVENOUS | Status: DC
  Administered 2018-03-13: 17:00:00 via INTRAVENOUS

## 2018-03-13 MED ORDER — CARVEDILOL 12.5 MG PO TABS
12.5000 mg | ORAL_TABLET | Freq: Two times a day (BID) | ORAL | Status: DC
  Administered 2018-03-13 – 2018-03-15 (×4): 12.5 mg via ORAL
  Filled 2018-03-13 (×4): qty 1

## 2018-03-13 MED ORDER — BUTAMBEN-TETRACAINE-BENZOCAINE 2-2-14 % EX AERO
INHALATION_SPRAY | CUTANEOUS | Status: DC | PRN
  Administered 2018-03-13: 2 via TOPICAL

## 2018-03-13 MED ORDER — FENTANYL CITRATE (PF) 100 MCG/2ML IJ SOLN
INTRAMUSCULAR | Status: AC
  Filled 2018-03-13: qty 2

## 2018-03-13 MED ORDER — SODIUM CHLORIDE 0.9% IV SOLUTION: Freq: Once | INTRAVENOUS | Status: DC

## 2018-03-13 MED ORDER — FENTANYL CITRATE (PF) 100 MCG/2ML IJ SOLN
INTRAMUSCULAR | Status: DC | PRN
  Administered 2018-03-13: 25 ug via INTRAVENOUS

## 2018-03-13 MED ORDER — POTASSIUM CHLORIDE IN NACL 20-0.9 MEQ/L-% IV SOLN
INTRAVENOUS | Status: DC
  Filled 2018-03-13: qty 1000

## 2018-03-13 MED ORDER — ACETAMINOPHEN 325 MG PO TABS: 650.0000 mg | ORAL_TABLET | Freq: Four times a day (QID) | ORAL | Status: DC | PRN

## 2018-03-13 MED ORDER — SODIUM CHLORIDE 0.9 % IV BOLUS
1000.0000 mL | Freq: Once | INTRAVENOUS | Status: AC
  Administered 2018-03-13: 1000 mL via INTRAVENOUS

## 2018-03-13 MED ORDER — ONDANSETRON HCL 4 MG PO TABS: 4.0000 mg | ORAL_TABLET | Freq: Four times a day (QID) | ORAL | Status: DC | PRN

## 2018-03-13 MED ORDER — ONDANSETRON HCL 4 MG/2ML IJ SOLN: 4.0000 mg | Freq: Four times a day (QID) | INTRAMUSCULAR | Status: DC | PRN

## 2018-03-13 MED ORDER — MIDAZOLAM HCL 10 MG/2ML IJ SOLN
INTRAMUSCULAR | Status: DC | PRN
  Administered 2018-03-13: 2 mg via INTRAVENOUS
  Administered 2018-03-13 (×3): 1 mg via INTRAVENOUS

## 2018-03-13 MED ORDER — TAMSULOSIN HCL 0.4 MG PO CAPS
0.4000 mg | ORAL_CAPSULE | Freq: Every day | ORAL | Status: DC
  Administered 2018-03-14 – 2018-03-15 (×2): 0.4 mg via ORAL
  Filled 2018-03-13 (×2): qty 1

## 2018-03-13 MED ORDER — SODIUM CHLORIDE 0.9 % IV SOLN
8.0000 mg/h | INTRAVENOUS | Status: DC
  Administered 2018-03-13 (×2): 8 mg/h via INTRAVENOUS
  Filled 2018-03-13 (×3): qty 80

## 2018-03-13 MED ORDER — SODIUM CHLORIDE 0.9 % IV SOLN: INTRAVENOUS | Status: DC

## 2018-03-13 MED ORDER — PANTOPRAZOLE SODIUM 40 MG IV SOLR
40.0000 mg | Freq: Once | INTRAVENOUS | Status: AC
  Administered 2018-03-13: 40 mg via INTRAVENOUS
  Filled 2018-03-13: qty 40

## 2018-03-13 MED ORDER — MIDAZOLAM HCL 5 MG/ML IJ SOLN
INTRAMUSCULAR | Status: AC
  Filled 2018-03-13: qty 2

## 2018-03-13 NOTE — Consult Note (Addendum)
Consultation  Referring Provider: Dr. Wynelle Cleveland    Primary Care Physician:  Lawerance Cruel, MD Primary Gastroenterologist: Althia Forts (Dr. Fuller Plan in 1999)       Reason for Consultation: Melena, Acute Blood Loss Anemia           HPI:   Chris Peterson is a 84 y.o. male with past medical history of abdominal aortic aneurysm, renal artery stenosis, kidney disease stage III, type 2 diabetes and others listed below, who presents to the ER today with a complaint of weakness and dizziness for 7 today 10 days worsening over the past 2.  We are consulted in regards to melena, Hemoccult positive stool and hemoglobin of 7.7 (11 in December 2018).    Today, patient found with daughter by bedside, who assists with history, explain that patient has had increasing weakness/dizziness over the past 2 months. He tells me he started an "eye vitamin" about 2 mos ago and noticed a corresponding change in bowel habits to "black and sticky", worse now over the past months, was experiencing 2 stools per day. Just yesterday had increase in stools, noting 5-6, last one this morning after breakfast and increase in DOE, dizziness and near-syncope.     Last ate at 7:00 this morning-corned beef hash, last drank ginger ale at 10:00 am. Is on a daily baby ASA.    Denies NSAID use, anticoagulants, abdominal pain, fever , chills, weight loss, change in appetite, reflux, heartburn, nausea or vomiting.  Past GI History: 02/16/1998-EGD, Dr. 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 AAA (abdominal aortic aneurysm) (HCC)    Infrarenal AAA - maximal diameter of 3.5 cm   . Atherosclerotic renal artery stenosis, bilateral (HCC)    Bilateral renal angiography 11/27/11,  Right 70% prox, Left 90% prox s/p LRA PTA/Stenting - 6.0x52m ;  12/12/2011 Renal Duplex: Patent LRA stent & > 60% RRA ost stenosis  . Blood transfusion    S/P "lost blood due to hemorrhoids"  . Dysrhythmia    "fast one time"  . Hemorrhoids   . High cholesterol   . HTN (hypertension)   . Kidney disease, chronic, stage III (moderate, EGFR 30-59 ml/min) (HCC)   . Pericardial effusion    a. incidentally noted on renal u/s 11/2011;  b.  12/16/2011 Echo:  EF 55-60%, Gr1 DD, Mild MR, PASP 32, Mod circumferential peric effusion w/o hemodynamic compromise. Density in pericardial space - consider MRI  . [place]     Past Surgical History:  Procedure Laterality Date  . ABDOMINAL ANGIOGRAM N/A 11/27/2011   Procedure: ABDOMINAL ANGIOGRAM;  Surgeon: MSherren Mocha MD;  [facility];  Service: Cardiovascular;  Laterality: N/A;  . CATARACT EXTRACTION W/ INTRAOCULAR LENS  IMPLANT, BILATERAL  1990's  . NASAL SEPTUM SURGERY  1980's  . RENAL ANGIOGRAM Bilateral 11/27/2011   Procedure: RENAL ANGIOGRAM;  Surgeon: MSherren Mocha MD;  [facility];  Service: Cardiovascular;  Laterality: Bilateral;  possible PTA/stent  . RENAL ARTERY STENT  11/27/11    Family History  Problem Relation Age of Onset  . Hypertension Mother   . Hypertension Brother      Social History   Tobacco Use  . Smoking status: Current Every Day Smoker    Years: 65.00  Types: Pipe, Cigarettes    Last attempt to quit: 08/19/1996    Years since quitting: 21.5  . Smokeless tobacco: Never Used  . Tobacco comment: 11/27/11 "smoked pipe since 1998; quit cigarettes 1998"  Substance Use Topics  . Alcohol use: Yes    Comment: 03/13/18 2-3 Oz daily   . Drug use: No    Prior to Admission medications   Medication Sig Start Date End Date Taking? Authorizing Provider  amLODipine (NORVASC) 5 MG tablet Take 5 mg by mouth daily. 02/20/16  Yes [provider]  aspirin EC 81 MG tablet Take 81 mg by  mouth daily.   Yes [provider]  carvedilol (COREG) 12.5 MG tablet Take 12.5 mg by mouth 2 (two) times daily with a meal.    Yes [provider]  Cholecalciferol (VITAMIN D3) 2000 UNITS TABS Take 1,000 mg by mouth daily.    Yes [provider]  fenofibrate 160 MG tablet Take 160 mg by mouth daily. 08/31/15  Yes [provider]  furosemide (LASIX) 20 MG tablet Take 20 mg by mouth daily.   Yes [provider]  LUTEIN-ZEAXANTHIN PO Take by mouth.   Yes [provider]  rosuvastatin (CRESTOR) 40 MG tablet Take 40 mg by mouth daily.   Yes [provider]  tamsulosin (FLOMAX) 0.4 MG CAPS capsule Take 0.4 mg by mouth daily after breakfast.  10/28/14  Yes [provider]    Current Facility-Administered Medications  Medication Dose Route Frequency Provider Last Rate Last Dose  . 0.9 %  sodium chloride infusion (Manually program via Guardrails IV Fluids)   Intravenous Once Milton Ferguson, MD      . pantoprazole (PROTONIX) 80 mg in sodium chloride 0.9 % 250 mL (0.32 mg/mL) infusion  8 mg/hr Intravenous Continuous Milton Ferguson, MD 25 mL/hr at 03/13/18 1116 8 mg/hr at 03/13/18 1116   Current Outpatient Medications  Medication Sig Dispense Refill  . amLODipine (NORVASC) 5 MG tablet Take 5 mg by mouth daily.    [name] aspirin EC 81 MG tablet Take 81 mg by mouth daily.    . carvedilol (COREG) 12.5 MG tablet Take 12.5 mg by mouth 2 (two) times daily with a meal.     . Cholecalciferol (VITAMIN D3) 2000 UNITS TABS Take 1,000 mg by mouth daily.     . fenofibrate 160 MG tablet Take 160 mg by mouth daily.    . furosemide (LASIX) 20 MG tablet Take 20 mg by mouth daily.    . LUTEIN-ZEAXANTHIN PO Take by mouth.    . rosuvastatin (CRESTOR) 40 MG tablet Take 40 mg by mouth daily.    . tamsulosin (FLOMAX) 0.4 MG CAPS capsule Take 0.4 mg by mouth daily after breakfast.       Allergies as of 03/13/2018  . (No Known Allergies)     Review of  Systems:    Constitutional: No weight loss, fever or chills Skin: No rash  Cardiovascular: No chest pain Respiratory: +DOE Gastrointestinal: See HPI and otherwise negative Genitourinary: No dysuria Neurological: No headache Musculoskeletal: No new muscle or joint pain Hematologic: No bruising Psychiatric: No history of depression or anxiety   Physical Exam:  Vital signs in last 24 hours: Temp:  [97.5 F (36.4 C)-97.7 F (36.5 C)] 97.7 F (36.5 C) (07/26 1216) Pulse Rate:  [76-81] 81 (07/26 1216) Resp:  [18-23] 23 (07/26 1216) BP: (145-155)/(85-103) 145/103 (07/26 1216) SpO2:  [100 %] 100 % (07/26 1216) Weight:  [132 lb 8 oz (60.1  kg)] 132 lb 8 oz (60.1 kg) (07/26 1005)   General:   Pleasant Elderly Caucasian male appears to be in NAD, Well developed, Well nourished, alert and cooperative Head:  Normocephalic and atraumatic. Eyes:   PEERL, EOMI. No icterus. Conjunctiva pink. Ears:  Normal auditory acuity. Neck:  Supple Throat: Oral cavity and pharynx without inflammation, swelling or lesion. Poor dentition Lungs: Respirations even and unlabored. Lungs clear to auscultation bilaterally.   No wheezes, crackles, or rhonchi.  Heart: Normal S1, S2. No MRG. Regular rate and rhythm. No peripheral edema, cyanosis or pallor.  Abdomen:  Soft, nondistended, nontender. No rebound or guarding. Normal bowel sounds. No appreciable masses or hepatomegaly. Rectal:  Not performed by me-ER physician reports "black heme + stool" Msk:  Symmetrical without gross deformities. Peripheral pulses intact.  Extremities:  Without edema, no deformity or joint abnormality. Normal ROM, normal sensation. Neurologic:  Alert and  oriented x4;  grossly normal neurologically.  Skin:   Dry and intact without significant lesions or rashes. Psychiatric: Demonstrates good judgement and reason without abnormal affect or behaviors.   LAB RESULTS: Recent Labs    03/13/18 1031 03/13/18 1102  WBC 6.1  --   HGB 7.7*  7.5*  HCT 23.8* 22.0*  PLT 135*  --    BMET Recent Labs    03/13/18 1031 03/13/18 1102  NA 144 142  K 3.8 3.8  CL 108 106  CO2 28  --   GLUCOSE 145* 137*  BUN 80* 78*  CREATININE 1.61* 1.50*  CALCIUM 9.1  --    LFT Recent Labs    03/13/18 1031  PROT 5.8*  ALBUMIN 3.0*  AST 36  ALT 18  ALKPHOS 32*  BILITOT 0.8   PT/INR Recent Labs    03/13/18 1031  LABPROT 16.3*  INR 1.32     Impression / Plan:   Impression: 1.  Acute blood loss anemia: hgb 7.5 (11 in Dec 2008); likely due to upper GIB 2.  Melena:for the past 4 weeks, increased over past 24-48 hours, no GERD, abdominal pain, anticoagulants or NSAID use, no h/o previous similar symptoms; Consider PUD vs SB source vs other  Plan: 1.  Continue to monitor hemoglobin with transfusion as needed less than 8 2.  Continue supportive measures 3.  Agree with PPI BID 4. Patient will have EGD this afternoon with Dr. Fuller Plan. Discussed risks (increased due to patient age), benefits, limitations and alternatives and the patient agrees to proceed. 5. Please await any further recommendations from Dr. Fuller Plan after time of procedure.  Thank you for your kind consultation, we will continue to follow.  Lavone Nian Lemmon  03/13/2018, 12:22 PM Pager #: [phone]     Attending physician's note   I have taken a history, examined the patient and reviewed the chart. I agree with the Advanced Practitioner's note, impression and recommendations. Melena and subacute blood loss anemia. History of colonic AVMs. R/O ulcer, AVM, neoplasm. PPI bid for now. EGD today.   Lucio Edward, MD FACG [phone] office

## 2018-03-13 NOTE — Op Note (Signed)
[facility] Patient Name: Chris Peterson Procedure Date: 03/13/2018 MRN: [id number] Attending MD: Ladene Artist , MD Date of Birth: [birth year] CSN: [id number] Age: 84 Admit Type: Inpatient Procedure:                Upper GI endoscopy Indications:              Melena, Suspected upper gastrointestinal bleeding Providers:                Pricilla Riffle. Fuller Plan, MD, Zenon Mayo, RNJanie                            [place], Technician Referring MD:             [facility] Medicines:                Fentanyl 25 micrograms IV, Midazolam 5 mg IV Complications:            No immediate complications. Estimated Blood Loss:     Estimated blood loss was minimal. Procedure:                Pre-Anesthesia Assessment:                           - Prior to the procedure, a History and Physical                            was performed, and patient medications and                            allergies were reviewed. The patient's tolerance of                            previous anesthesia was also reviewed. The risks                            and benefits of the procedure and the sedation                            options and risks were discussed with the patient.                            All questions were answered, and informed consent                            was obtained. Prior Anticoagulants: The patient has                            taken no previous anticoagulant or antiplatelet                            agents. ASA Grade Assessment: II - A patient with                            mild systemic disease. After reviewing the risks  and benefits, the patient was deemed in                            satisfactory condition to undergo the procedure.                           After obtaining informed consent, the endoscope was                            passed under direct vision. Throughout the                            procedure, the patient's blood  pressure, pulse, and                            oxygen saturations were monitored continuously. The                            GIF-H190 ([id number]) Olympus adult endoscope was                            introduced through the mouth, and advanced to the                            second part of duodenum. The upper GI endoscopy was                            accomplished without difficulty. The patient                            tolerated the procedure well. Scope In: Scope Out: Findings:      The examined esophagus was normal.      One non-bleeding cratered gastric ulcer with a visible vessel was found       in the gastric antrum. The lesion was 6 mm in largest dimension.       Coagulation for hemostasis using bipolar probe was successful.      A few localized, small non-bleeding erosions were found in the gastric       antrum. There were no stigmata of recent bleeding. Biopsies were taken       with a cold forceps for histology.      A small hiatal hernia was present.      The exam of the stomach was otherwise normal.      The duodenal bulb and second portion of the duodenum were normal. Impression:               - Normal esophagus.                           - Non-bleeding gastric ulcer with a visible vessel.                            Treated with bipolar cautery.                           - Non-bleeding  erosive gastropathy. Biopsied.                           - Small hiatal hernia.                           - Normal duodenal bulb and second portion of the                            duodenum. Moderate Sedation:      Moderate (conscious) sedation was administered by the endoscopy nurse       and supervised by the endoscopist. The following parameters were       monitored: oxygen saturation, heart rate, blood pressure, respiratory       rate, EKG, adequacy of pulmonary ventilation, and response to care.       Total physician intraservice time was 20 minutes. Recommendation:           -  Return patient to hospital ward for ongoing care.                           - Full liquid diet today.                           - Continue present medications.                           - Await pathology results.                           - No aspirin, ibuprofen, naproxen, or other                            non-steroidal anti-inflammatory drugs. Procedure Code(s):        --- Professional ---                           [phone], 59, Esophagogastroduodenoscopy, flexible,                            transoral; with control of bleeding, any method                           43239, Esophagogastroduodenoscopy, flexible,                            transoral; with biopsy, single or multiple                           G0500, Moderate sedation services provided by the                            same physician or other qualified health care                            professional performing a gastrointestinal  endoscopic service that sedation supports,                            requiring the presence of an independent trained                            observer to assist in the monitoring of the                            patient's level of consciousness and physiological                            status; initial 15 minutes of intra-service time;                            patient age 5 years or older (additional time may                            be reported with [phone], as appropriate) Diagnosis Code(s):        --- Professional ---                           K25.4, Chronic or unspecified gastric ulcer with                            hemorrhage                           K31.89, Other diseases of stomach and duodenum                           K44.9, Diaphragmatic hernia without obstruction or                            gangrene                           K92.1, Melena (includes Hematochezia) CPT copyright 2017 [employer]. All rights reserved. The codes  documented in this report are preliminary and upon coder review may  be revised to meet current compliance requirements. Ladene Artist, MD 03/13/2018 4:01:47 PM This report has been signed electronically. Number of Addenda: 0

## 2018-03-13 NOTE — H&P (View-Only) (Signed)
Consultation  Referring Provider: Dr. Wynelle Cleveland    Primary Care Physician:  Lawerance Cruel, MD Primary Gastroenterologist: Althia Forts (Dr. Fuller Plan in 1999)       Reason for Consultation: Melena, Acute Blood Loss Anemia           HPI:   Chris Peterson is a 84 y.o. male with past medical history of abdominal aortic aneurysm, renal artery stenosis, kidney disease stage III, type 2 diabetes and others listed below, who presents to the ER today with a complaint of weakness and dizziness for 7 today 10 days worsening over the past 2.  We are consulted in regards to melena, Hemoccult positive stool and hemoglobin of 7.7 (11 in December 2018).    Today, patient found with daughter by bedside, who assists with history, explain that patient has had increasing weakness/dizziness over the past 2 months. He tells me he started an "eye vitamin" about 2 mos ago and noticed a corresponding change in bowel habits to "black and sticky", worse now over the past months, was experiencing 2 stools per day. Just yesterday had increase in stools, noting 5-6, last one this morning after breakfast and increase in DOE, dizziness and near-syncope.     Last ate at 7:00 this morning-corned beef hash, last drank ginger ale at 10:00 am. Is on a daily baby ASA.    Denies NSAID use, anticoagulants, abdominal pain, fever , chills, weight loss, change in appetite, reflux, heartburn, nausea or vomiting.  Past GI History: 02/16/1998-EGD, Dr. 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 AAA (abdominal aortic aneurysm) (HCC)    Infrarenal AAA - maximal diameter of 3.5 cm   . Atherosclerotic renal artery stenosis, bilateral (HCC)    Bilateral renal angiography 11/27/11,  Right 70% prox, Left 90% prox s/p LRA PTA/Stenting - 6.0x52m ;  12/12/2011 Renal Duplex: Patent LRA stent & > 60% RRA ost stenosis  . Blood transfusion    S/P "lost blood due to hemorrhoids"  . Dysrhythmia    "fast one time"  . Hemorrhoids   . High cholesterol   . HTN (hypertension)   . Kidney disease, chronic, stage III (moderate, EGFR 30-59 ml/min) (HCC)   . Pericardial effusion    a. incidentally noted on renal u/s 11/2011;  b.  12/16/2011 Echo:  EF 55-60%, Gr1 DD, Mild MR, PASP 32, Mod circumferential peric effusion w/o hemodynamic compromise. Density in pericardial space - consider MRI  . [place] II [place]     Past Surgical History:  Procedure Laterality Date  . ABDOMINAL ANGIOGRAM N/A 11/27/2011   Procedure: ABDOMINAL ANGIOGRAM;  Surgeon: MSherren Mocha MD;  [facility];  Service: Cardiovascular;  Laterality: N/A;  . CATARACT EXTRACTION W/ INTRAOCULAR LENS  IMPLANT, BILATERAL  1990's  . NASAL SEPTUM SURGERY  1980's  . RENAL ANGIOGRAM Bilateral 11/27/2011   Procedure: RENAL ANGIOGRAM;  Surgeon: MSherren Mocha MD;  [facility];  Service: Cardiovascular;  Laterality: Bilateral;  possible PTA/stent  . RENAL ARTERY STENT  11/27/11    Family History  Problem Relation Age of Onset  . Hypertension Mother   . Hypertension Brother      Social History   Tobacco Use  . Smoking status: Current Every Day Smoker    Years: 65.00  Types: Pipe, Cigarettes    Last attempt to quit: 08/19/1996    Years since quitting: 21.5  . Smokeless tobacco: Never Used  . Tobacco comment: 11/27/11 "smoked pipe since 1998; quit cigarettes 1998"  Substance Use Topics  . Alcohol use: Yes    Comment: 03/13/18 2-3 Oz daily   . Drug use: No    Prior to Admission medications   Medication Sig Start Date End Date Taking? Authorizing Provider  amLODipine (NORVASC) 5 MG tablet Take 5 mg by mouth daily. 02/20/16  Yes [provider]  aspirin EC 81 MG tablet Take 81 mg by  mouth daily.   Yes [provider]  carvedilol (COREG) 12.5 MG tablet Take 12.5 mg by mouth 2 (two) times daily with a meal.    Yes [provider]  Cholecalciferol (VITAMIN D3) 2000 UNITS TABS Take 1,000 mg by mouth daily.    Yes [provider]  fenofibrate 160 MG tablet Take 160 mg by mouth daily. 08/31/15  Yes [provider]  furosemide (LASIX) 20 MG tablet Take 20 mg by mouth daily.   Yes [provider]  LUTEIN-ZEAXANTHIN PO Take by mouth.   Yes [provider]  rosuvastatin (CRESTOR) 40 MG tablet Take 40 mg by mouth daily.   Yes [provider]  tamsulosin (FLOMAX) 0.4 MG CAPS capsule Take 0.4 mg by mouth daily after breakfast.  10/28/14  Yes [provider]    Current Facility-Administered Medications  Medication Dose Route Frequency Provider Last Rate Last Dose  . 0.9 %  sodium chloride infusion (Manually program via Guardrails IV Fluids)   Intravenous Once Milton Ferguson, MD      . pantoprazole (PROTONIX) 80 mg in sodium chloride 0.9 % 250 mL (0.32 mg/mL) infusion  8 mg/hr Intravenous Continuous Milton Ferguson, MD 25 mL/hr at 03/13/18 1116 8 mg/hr at 03/13/18 1116   Current Outpatient Medications  Medication Sig Dispense Refill  . amLODipine (NORVASC) 5 MG tablet Take 5 mg by mouth daily.    [name] aspirin EC 81 MG tablet Take 81 mg by mouth daily.    . carvedilol (COREG) 12.5 MG tablet Take 12.5 mg by mouth 2 (two) times daily with a meal.     . Cholecalciferol (VITAMIN D3) 2000 UNITS TABS Take 1,000 mg by mouth daily.     . fenofibrate 160 MG tablet Take 160 mg by mouth daily.    . furosemide (LASIX) 20 MG tablet Take 20 mg by mouth daily.    . LUTEIN-ZEAXANTHIN PO Take by mouth.    . rosuvastatin (CRESTOR) 40 MG tablet Take 40 mg by mouth daily.    . tamsulosin (FLOMAX) 0.4 MG CAPS capsule Take 0.4 mg by mouth daily after breakfast.       Allergies as of 03/13/2018  . (No Known Allergies)     Review of  Systems:    Constitutional: No weight loss, fever or chills Skin: No rash  Cardiovascular: No chest pain Respiratory: +DOE Gastrointestinal: See HPI and otherwise negative Genitourinary: No dysuria Neurological: No headache Musculoskeletal: No new muscle or joint pain Hematologic: No bruising Psychiatric: No history of depression or anxiety   Physical Exam:  Vital signs in last 24 hours: Temp:  [97.5 F (36.4 C)-97.7 F (36.5 C)] 97.7 F (36.5 C) (07/26 1216) Pulse Rate:  [76-81] 81 (07/26 1216) Resp:  [18-23] 23 (07/26 1216) BP: (145-155)/(85-103) 145/103 (07/26 1216) SpO2:  [100 %] 100 % (07/26 1216) Weight:  [132 lb 8 oz (60.1  kg)] 132 lb 8 oz (60.1 kg) (07/26 1005)   General:   Pleasant Elderly Caucasian male appears to be in NAD, Well developed, Well nourished, alert and cooperative Head:  Normocephalic and atraumatic. Eyes:   PEERL, EOMI. No icterus. Conjunctiva pink. Ears:  Normal auditory acuity. Neck:  Supple Throat: Oral cavity and pharynx without inflammation, swelling or lesion. Poor dentition Lungs: Respirations even and unlabored. Lungs clear to auscultation bilaterally.   No wheezes, crackles, or rhonchi.  Heart: Normal S1, S2. No MRG. Regular rate and rhythm. No peripheral edema, cyanosis or pallor.  Abdomen:  Soft, nondistended, nontender. No rebound or guarding. Normal bowel sounds. No appreciable masses or hepatomegaly. Rectal:  Not performed by me-ER physician reports "black heme + stool" Msk:  Symmetrical without gross deformities. Peripheral pulses intact.  Extremities:  Without edema, no deformity or joint abnormality. Normal ROM, normal sensation. Neurologic:  Alert and  oriented x4;  grossly normal neurologically.  Skin:   Dry and intact without significant lesions or rashes. Psychiatric: Demonstrates good judgement and reason without abnormal affect or behaviors.   LAB RESULTS: Recent Labs    03/13/18 1031 03/13/18 1102  WBC 6.1  --   HGB 7.7*  7.5*  HCT 23.8* 22.0*  PLT 135*  --    BMET Recent Labs    03/13/18 1031 03/13/18 1102  NA 144 142  K 3.8 3.8  CL 108 106  CO2 28  --   GLUCOSE 145* 137*  BUN 80* 78*  CREATININE 1.61* 1.50*  CALCIUM 9.1  --    LFT Recent Labs    03/13/18 1031  PROT 5.8*  ALBUMIN 3.0*  AST 36  ALT 18  ALKPHOS 32*  BILITOT 0.8   PT/INR Recent Labs    03/13/18 1031  LABPROT 16.3*  INR 1.32     Impression / Plan:   Impression: 1.  Acute blood loss anemia: hgb 7.5 (11 in Dec 2008); likely due to upper GIB 2.  Melena:for the past 4 weeks, increased over past 24-48 hours, no GERD, abdominal pain, anticoagulants or NSAID use, no h/o previous similar symptoms; Consider PUD vs SB source vs other  Plan: 1.  Continue to monitor hemoglobin with transfusion as needed less than 8 2.  Continue supportive measures 3.  Agree with PPI BID 4. Patient will have EGD this afternoon with Dr. Fuller Plan. Discussed risks (increased due to patient age), benefits, limitations and alternatives and the patient agrees to proceed. 5. Please await any further recommendations from Dr. Fuller Plan after time of procedure.  Thank you for your kind consultation, we will continue to follow.  Lavone Nian Lemmon  03/13/2018, 12:22 PM Pager #: [phone]     Attending physician's note   I have taken a history, examined the patient and reviewed the chart. I agree with the Advanced Practitioner's note, impression and recommendations. Melena and subacute blood loss anemia. History of colonic AVMs. R/O ulcer, AVM, neoplasm. PPI bid for now. EGD today.   Lucio Edward, MD FACG [phone] office

## 2018-03-13 NOTE — ED Notes (Signed)
1x unsuccessful IV attempt

## 2018-03-13 NOTE — ED Triage Notes (Signed)
Pt arrives via POV from home with family. Pt reports weakness and dizziness for 7-10 days but worsening in the last 2 days. Pt reports dizziness that is "pretty much constant". Pt was checked for UTI Monday, UA was negative. Pt denies any other symptoms. Pt denies headache, chest pain or shortness of breath.

## 2018-03-13 NOTE — ED Notes (Signed)
ED TO INPATIENT HANDOFF REPORT  Name/Age/Gender Chris Peterson 84 y.o. male  Code Status Advance Directive Documentation     Most Recent Value  Type of Advance Directive  Healthcare Power of Attorney, Living will  Pre-existing out of facility DNR order (yellow form or pink MOST form)  -  "MOST" Form in Place?  -      Home/SNF/Other Home  Chief Complaint weakness  Level of Care/Admitting Diagnosis ED Disposition    ED Disposition Condition [place]: [facility] [100102]  Level of Care: Med-Surg [16]  Diagnosis: GI bleed [329924]  Admitting Physician: [place], [place]  Attending Physician: Debbe Odea [[id number]]  Estimated length of stay: past midnight tomorrow  Certification:: I certify this patient will need inpatient services for at least 2 midnights  PT Class (Do Not Modify): Inpatient [101]  PT Acc Code (Do Not Modify): Private [1]       Medical History Past Medical History:  Diagnosis Date  . AAA (abdominal aortic aneurysm) (HCC)    Infrarenal AAA - maximal diameter of 3.5 cm   . Atherosclerotic renal artery stenosis, bilateral (HCC)    Bilateral renal angiography 11/27/11, Right 70% prox, Left 90% prox s/p LRA PTA/Stenting - 6.0x10m ;  12/12/2011 Renal Duplex: Patent LRA stent & > 60% RRA ost stenosis  . Blood transfusion    S/P "lost blood due to hemorrhoids"  . Dysrhythmia    "fast one time"  . Hemorrhoids   . High cholesterol   . HTN (hypertension)   . Kidney disease, chronic, stage III (moderate, EGFR 30-59 ml/min) (HCC)   . Pericardial effusion    a. incidentally noted on renal u/s 11/2011;  b.  12/16/2011 Echo:  EF 55-60%, Gr1 DD, Mild MR, PASP 32, Mod circumferential peric effusion w/o hemodynamic compromise. Density in pericardial space - consider MRI  . Type II diabetes mellitus (HCC)     Allergies No Known Allergies  IV Location/Drains/Wounds Patient Lines/Drains/Airways Status   Active  Line/Drains/Airways    None          Labs/Imaging Results for orders placed or performed during the hospital encounter of 03/13/18 (from the past 48 hour(s))  POC occult blood, ED     Status: Abnormal   Collection Time: 03/13/18 10:29 AM  Result Value Ref Range   Fecal Occult Bld POSITIVE (A) NEGATIVE  Type and screen     Status: None (Preliminary result)   Collection Time: 03/13/18 10:31 AM  Result Value Ref Range   ABO/RH(D) A POS    Antibody Screen NEG    Sample Expiration      03/16/2018 [facility] [address] [address] [place] [address]   Unit Number [id number]   Blood Component Type RED CELLS,LR    Unit division 00    Status of Unit ALLOCATED    Transfusion Status OK TO TRANSFUSE    Crossmatch Result Compatible    Unit Number [id number]   Blood Component Type RED CELLS,LR    Unit division 00    Status of Unit ALLOCATED    Transfusion Status OK TO TRANSFUSE    Crossmatch Result Compatible   CBC with Differential/Platelet     Status: Abnormal   Collection Time: 03/13/18 10:31 AM  Result Value Ref Range   WBC 6.1 4.0 - 10.5 K/uL   RBC 2.76 (L) 4.22 - 5.81 MIL/uL   Hemoglobin 7.7 (L) 13.0 - 17.0  g/dL   HCT 23.8 (L) 39.0 - 52.0 %   MCV 86.2 78.0 - 100.0 fL   MCH 27.9 26.0 - 34.0 pg   MCHC 32.4 30.0 - 36.0 g/dL   RDW 16.9 (H) 11.5 - 15.5 %   Platelets 135 (L) 150 - 400 K/uL   Neutrophils Relative % 63 %   Lymphocytes Relative 33 %   Monocytes Relative 4 %   Eosinophils Relative 0 %   Basophils Relative 0 %   Neutro Abs 3.9 1.7 - 7.7 K/uL   Lymphs Abs 2.0 0.7 - 4.0 K/uL   Monocytes Absolute 0.2 0.1 - 1.0 K/uL   Eosinophils Absolute 0.0 0.0 - 0.7 K/uL   Basophils Absolute 0.0 0.0 - 0.1 K/uL   Smear Review MORPHOLOGY UNREMARKABLE     Comment: Performed at [facility], [place] [address], [place], [place] [address]  Comprehensive metabolic panel     Status: Abnormal   Collection Time: 03/13/18 10:31  AM  Result Value Ref Range   Sodium 144 135 - 145 mmol/L   Potassium 3.8 3.5 - 5.1 mmol/L   Chloride 108 98 - 111 mmol/L   CO2 28 22 - 32 mmol/L   Glucose, Bld 145 (H) 70 - 99 mg/dL   BUN 80 (H) 8 - 23 mg/dL   Creatinine, Ser 1.61 (H) 0.61 - 1.24 mg/dL   Calcium 9.1 8.9 - 10.3 mg/dL   Total Protein 5.8 (L) 6.5 - 8.1 g/dL   Albumin 3.0 (L) 3.5 - 5.0 g/dL   AST 36 15 - 41 U/L   ALT 18 0 - 44 U/L   Alkaline Phosphatase 32 (L) 38 - 126 U/L   Total Bilirubin 0.8 0.3 - 1.2 mg/dL   GFR calc non Af Amer 37 (L) >60 mL/min   GFR calc Af Amer 43 (L) >60 mL/min    Comment: (NOTE) The eGFR has been calculated using the CKD EPI equation. This calculation has not been validated in all clinical situations. eGFR's persistently <60 mL/min signify possible Chronic Kidney Disease.    Anion gap 8 5 - 15    Comment: Performed at [facility], [place] [address] [place], [place] [address]  Protime-INR     Status: Abnormal   Collection Time: 03/13/18 10:31 AM  Result Value Ref Range   Prothrombin Time 16.3 (H) 11.4 - 15.2 seconds   INR 1.32     Comment: Performed at [facility], [place] [address], [place], [place] [address]  ABO/Rh     Status: None   Collection Time: 03/13/18 10:42 AM  Result Value Ref Range   ABO/RH(D)      A POS Performed at [facility], [place] [address], [place], [place] [address]   I-stat chem 8, ed     Status: Abnormal   Collection Time: 03/13/18 11:02 AM  Result Value Ref Range   Sodium 142 135 - 145 mmol/L   Potassium 3.8 3.5 - 5.1 mmol/L   Chloride 106 98 - 111 mmol/L   BUN 78 (H) 8 - 23 mg/dL   Creatinine, Ser 1.50 (H) 0.61 - 1.24 mg/dL   Glucose, Bld 137 (H) 70 - 99 mg/dL   Calcium, Ion 1.18 1.15 - 1.40 mmol/L   TCO2 25 22 - 32 mmol/L   Hemoglobin 7.5 (L) 13.0 - 17.0 g/dL   HCT 22.0 (L) 39.0 - 52.0 %  Prepare RBC     Status: None   Collection Time: 03/13/18  11:29 AM  Result Value Ref Range   Order  Confirmation      ORDER PROCESSED BY BLOOD BANK Performed at [place] [address], [place], [place] [address]    No results found.  Pending Labs Unresulted Labs (From admission, onward)   Start     Ordered   03/13/18 1032  Occult blood card to lab, stool  Once,   STAT     03/13/18 1031      Vitals/Pain Today's Vitals   03/13/18 1005 03/13/18 1139 03/13/18 1216  BP: (!) 155/85  (!) 145/103  Pulse: 76  81  Resp: 18  (!) 23  Temp: (!) 97.5 F (36.4 C)  97.7 F (36.5 C)  TempSrc: Oral  Oral  SpO2: 100%  100%  Weight: 132 lb 8 oz (60.1 kg)    PainSc: 0-No pain 0-No pain     Isolation Precautions No active isolations  Medications Medications  pantoprazole (PROTONIX) 80 mg in sodium chloride 0.9 % 250 mL (0.32 mg/mL) infusion (8 mg/hr Intravenous New Bag/Given 03/13/18 1116)  0.9 %  sodium chloride infusion (Manually program via Guardrails IV Fluids) (has no administration in time range)  sodium chloride 0.9 % bolus 1,000 mL (1,000 mLs Intravenous New Bag/Given 03/13/18 1107)  pantoprazole (PROTONIX) injection 40 mg (40 mg Intravenous Given 03/13/18 1116)    Mobility Non-ambulatory at this time, normally ambulatory

## 2018-03-13 NOTE — Interval H&P Note (Signed)
History and Physical Interval Note:  03/13/2018 3:33 PM  Chris Peterson  has presented today for surgery, with the diagnosis of Melena, Acute Blood Loss Anemia  The various methods of treatment have been discussed with the patient and family. After consideration of risks, benefits and other options for treatment, the patient has consented to  Procedure(s): ESOPHAGOGASTRODUODENOSCOPY (EGD) (N/A) as a surgical intervention .  The patient's history has been reviewed, patient examined, no change in status, stable for surgery.  I have reviewed the patient's chart and labs.  Questions were answered to the patient's satisfaction.     Pricilla Riffle. Fuller Plan

## 2018-03-13 NOTE — ED Provider Notes (Signed)
[facility] DEPT Provider Note   CSN: [id number] Arrival date & time: 03/13/18  0941     History   Chief Complaint Chief Complaint  Patient presents with  . Weakness  . Dizziness    HPI Chris Peterson is a 84 y.o. male.  Patient complains of weakness for couple weeks and he has had black stools for over 2 weeks.  The history is provided by the patient. No language interpreter was used.  Weakness  Primary symptoms include dizziness.  Primary symptoms include no focal weakness. This is a new problem. The current episode started more than 1 week ago. The problem has not changed since onset.There was no focality noted. There has been no fever. Pertinent negatives include no shortness of breath, no chest pain and no headaches. There were no medications administered prior to arrival. Associated medical issues do not include trauma.  Dizziness  Associated symptoms: weakness   Associated symptoms: no chest pain, no diarrhea, no headaches and no shortness of breath     Past Medical History:  Diagnosis Date  . AAA (abdominal aortic aneurysm) (HCC)    Infrarenal AAA - maximal diameter of 3.5 cm   . Atherosclerotic renal artery stenosis, bilateral (HCC)    Bilateral renal angiography 11/27/11, Right 70% prox, Left 90% prox s/p LRA PTA/Stenting - 6.0x30m ;  12/12/2011 Renal Duplex: Patent LRA stent & > 60% RRA ost stenosis  . Blood transfusion    S/P "lost blood due to hemorrhoids"  . Dysrhythmia    "fast one time"  . Hemorrhoids   . High cholesterol   . HTN (hypertension)   . Kidney disease, chronic, stage III (moderate, EGFR 30-59 ml/min) (HCC)   . Pericardial effusion    a. incidentally noted on renal u/s 11/2011;  b.  12/16/2011 Echo:  EF 55-60%, Gr1 DD, Mild MR, PASP 32, Mod circumferential peric effusion w/o hemodynamic compromise. Density in pericardial space - consider MRI  . Type II diabetes mellitus ([facility]     Patient Active Problem List   Diagnosis Date Noted  . GI bleed 03/13/2018  . Pericardial effusion   . [place]ros[place] stenosis, bilateral [place]   . HTN (hypertension) 11/28/2011  . AAA (abdominal aortic aneurysm) ([place] 11/28/2011  . CKD (chronic kidney disease) 11/28/2011  . Diabetes mellitus type II 11/28/2011  . Tobacco abuse 11/28/2011    Past Surgical History:  Procedure Laterality Date  . ABDOMINAL ANGIOGRAM N/A 11/27/2011   Procedure: ABDOMINAL ANGIOGRAM;  Surgeon: MSherren Mocha MD;  [facility];  Service: Cardiovascular;  Laterality: N/A;  . CATARACT EXTRACTION W/ INTRAOCULAR LENS  IMPLANT, BILATERAL  1990's  . NASAL SEPTUM SURGERY  1980's  . RENAL ANGIOGRAM Bilateral 11/27/2011   Procedure: RENAL ANGIOGRAM;  Surgeon: MSherren Mocha MD;  [facility];  Service: Cardiovascular;  Laterality: Bilateral;  possible PTA/stent  . RENAL ARTERY STENT  11/27/11        Home Medications    Prior to Admission medications   Medication Sig Start Date End Date Taking? Authorizing Provider  amLODipine (NORVASC) 5 MG tablet Take 5 mg by mouth daily. 02/20/16  Yes [provider]  aspirin EC 81 MG tablet Take 81 mg by mouth daily.   Yes [provider]  carvedilol (COREG) 12.5 MG tablet Take 12.5 mg by mouth 2 (two) times daily with a meal.    Yes [provider]  Cholecalciferol (VITAMIN D3) 2000 UNITS TABS Take 1,000 mg by mouth  daily.    Yes [provider]  fenofibrate 160 MG tablet Take 160 mg by mouth daily. 08/31/15  Yes [provider]  furosemide (LASIX) 20 MG tablet Take 20 mg by mouth daily.   Yes [provider]  LUTEIN-ZEAXANTHIN PO Take by mouth.   Yes [provider]  rosuvastatin (CRESTOR) 40 MG tablet Take 40 mg by mouth daily.   Yes [provider]  tamsulosin (FLOMAX) 0.4 MG CAPS capsule Take 0.4 mg by mouth daily after breakfast.  10/28/14  Yes [provider]    Family History Family  History  Problem Relation Age of Onset  . Hypertension Mother   . Hypertension Brother     Social History Social History   Tobacco Use  . Smoking status: Current Every Day Smoker    Years: 65.00    Types: Pipe, Cigarettes    Last attempt to quit: 08/19/1996    Years since quitting: 21.5  . Smokeless tobacco: Never Used  . Tobacco comment: 11/27/11 "smoked pipe since 1998; quit cigarettes 1998"  Substance Use Topics  . Alcohol use: Yes    Comment: 03/13/18 2-3 Oz daily   . Drug use: No     Allergies   Patient has no known allergies.   Review of Systems Review of Systems  Constitutional: Negative for appetite change and fatigue.  HENT: Negative for congestion, ear discharge and sinus pressure.   Eyes: Negative for discharge.  Respiratory: Negative for cough and shortness of breath.   Cardiovascular: Negative for chest pain.  Gastrointestinal: Negative for abdominal pain and diarrhea.       Black stools  Genitourinary: Negative for frequency and hematuria.  Musculoskeletal: Negative for back pain.  Skin: Negative for rash.  Neurological: Positive for dizziness and weakness. Negative for focal weakness, seizures and headaches.  Psychiatric/Behavioral: Negative for hallucinations.     Physical Exam Updated Vital Signs BP (!) 145/103 (BP Location: Right Arm)   Pulse 81   Temp 97.7 F (36.5 C) (Oral)   Resp (!) 23   Wt 60.1 kg (132 lb 8 oz)   SpO2 100%   BMI 21.39 kg/m   Physical Exam  Constitutional: He is oriented to person, place, and time. He appears well-developed.  HENT:  Head: Normocephalic.  Eyes: Conjunctivae and EOM are normal. No scleral icterus.  Neck: Neck supple. No thyromegaly present.  Cardiovascular: Normal rate and regular rhythm. Exam reveals no gallop and no friction rub.  No murmur heard. Pulmonary/Chest: No stridor. He has no wheezes. He has no rales. He exhibits no tenderness.  Abdominal: He exhibits no distension. There is no tenderness.  There is no rebound.  Genitourinary:  Genitourinary Comments: Rectal exam shows black stools that seem positive  Musculoskeletal: Normal range of motion. He exhibits no edema.  Lymphadenopathy:    He has no cervical adenopathy.  Neurological: He is oriented to person, place, and time. He exhibits normal muscle tone. Coordination normal.  Skin: No rash noted. No erythema.  Psychiatric: He has a normal mood and affect. His behavior is normal.     ED Treatments / Results  Labs (all labs ordered are listed, but only abnormal results are displayed) Labs Reviewed  CBC WITH DIFFERENTIAL/PLATELET - Abnormal; Notable for the following components:      Result Value   RBC 2.76 (*)    Hemoglobin 7.7 (*)    HCT 23.8 (*)    RDW 16.9 (*)    Platelets 135 (*)  All other components within normal limits  COMPREHENSIVE METABOLIC PANEL - Abnormal; Notable for the following components:   Glucose, Bld 145 (*)    BUN 80 (*)    Creatinine, Ser 1.61 (*)    Total Protein 5.8 (*)    Albumin 3.0 (*)    Alkaline Phosphatase 32 (*)    GFR calc non Af Amer 37 (*)    GFR calc Af Amer 43 (*)    All other components within normal limits  PROTIME-INR - Abnormal; Notable for the following components:   Prothrombin Time 16.3 (*)    All other components within normal limits  POC OCCULT BLOOD, ED - Abnormal; Notable for the following components:   Fecal Occult Bld POSITIVE (*)    All other components within normal limits  I-STAT CHEM 8, ED - Abnormal; Notable for the following components:   BUN 78 (*)    Creatinine, Ser 1.50 (*)    Glucose, Bld 137 (*)    Hemoglobin 7.5 (*)    HCT 22.0 (*)    All other components within normal limits  OCCULT BLOOD X 1 CARD TO LAB, STOOL  TYPE AND SCREEN  PREPARE RBC (CROSSMATCH)  ABO/RH    EKG None  Radiology No results found.  Procedures Procedures (including critical care time)  Medications Ordered in ED Medications  pantoprazole (PROTONIX) 80 mg in  sodium chloride 0.9 % 250 mL (0.32 mg/mL) infusion (8 mg/hr Intravenous New Bag/Given 03/13/18 1116)  0.9 %  sodium chloride infusion (Manually program via Guardrails IV Fluids) (has no administration in time range)  sodium chloride 0.9 % bolus 1,000 mL (1,000 mLs Intravenous New Bag/Given 03/13/18 1107)  pantoprazole (PROTONIX) injection 40 mg (40 mg Intravenous Given 03/13/18 1116)   CRITICAL CARE Performed by: Milton Ferguson Total critical care time: 35 minutes Critical care time was exclusive of separately billable procedures and treating other patients. Critical care was necessary to treat or prevent imminent or life-threatening deterioration. Critical care was time spent personally by me on the following activities: development of treatment plan with patient and/or surrogate as well as nursing, discussions with consultants, evaluation of patient's response to treatment, examination of patient, obtaining history from patient or surrogate, ordering and performing treatments and interventions, ordering and review of laboratory studies, ordering and review of radiographic studies, pulse oximetry and re-evaluation of patient's condition.   Initial Impression / Assessment and Plan / ED Course  I have reviewed the triage vital signs and the nursing notes.  Pertinent labs & imaging results that were available during my care of the patient were reviewed by me and considered in my medical decision making (see chart for details).    Patient with an upper GI bleed.  He will be placed on Protonix and will be transfused a couple units of blood because they have symptomatic anemia.  GI will consult and medicine will admit  Final Clinical Impressions(s) / ED Diagnoses   Final diagnoses:  None    ED Discharge Orders    None       Milton Ferguson, MD 03/13/18 1241

## 2018-03-13 NOTE — ED Notes (Addendum)
Delay on second line and consent before transfer, GI at bedside and hospitalist have been at bedside

## 2018-03-13 NOTE — H&P (Signed)
History and Physical    Chris Peterson:[id number] DOB: [birth year] DOA: 03/13/2018    PCP: Lawerance Cruel, MD  Patient coming from: home  Chief Complaint: black stools  HPI: Chris Peterson is a 84 y.o. male with medical history of bilateral RAS s/p stenting, CKD 3, AAA,   who presents for 2-3 wks of black stools, about 2 stools per day. No h/o abdominal pain, nausea, heartburn or reflux, or prior PUD. No h/o NSAID use other than a baby aspirin. No dizziness. He has been eating normally.   ED Course: hemoccult +, Hb 7.5  Review of Systems:  Feet and ankles tend to be swollen at the end of the day Bruises easily  All other systems reviewed and apart from HPI, are negative.  Past Medical History:  Diagnosis Date  . AAA (abdominal aortic aneurysm) (HCC)    Infrarenal AAA - maximal diameter of 3.5 cm   . Atherosclerotic renal artery stenosis, bilateral (HCC)    Bilateral renal angiography 11/27/11, Right 70% prox, Left 90% prox s/p LRA PTA/Stenting - 6.0x62m ;  12/12/2011 Renal Duplex: Patent LRA stent & > 60% RRA ost stenosis  . Blood transfusion    S/P "lost blood due to hemorrhoids"  . Dysrhythmia    "fast one time"  . Hemorrhoids   . High cholesterol   . HTN (hypertension)   . Kidney disease, chronic, stage III (moderate, EGFR 30-59 ml/min) (HCC)   . Pericardial effusion    a. incidentally noted on renal u/s 11/2011;  b.  12/16/2011 Echo:  EF 55-60%, Gr1 DD, Mild MR, PASP 32, Mod circumferential peric effusion w/o hemodynamic compromise. Density in pericardial space - consider MRI  . [place] II diabetes mellitus ([place]     Past Surgical History:  Procedure Laterality Date  . ABDOMINAL ANGIOGRAM N/A 11/27/2011   Procedure: ABDOMINAL ANGIOGRAM;  Surgeon: MSherren Mocha MD;  [facility];  Service: Cardiovascular;  Laterality: N/A;  . CATARACT EXTRACTION W/ INTRAOCULAR LENS  IMPLANT, BILATERAL  1990's  . NASAL SEPTUM SURGERY  1980's  . RENAL ANGIOGRAM Bilateral  11/27/2011   Procedure: RENAL ANGIOGRAM;  Surgeon: MSherren Mocha MD;  [facility];  Service: Cardiovascular;  Laterality: Bilateral;  possible PTA/stent  . RENAL ARTERY STENT  11/27/11    Social History:   reports that he has been smoking pipe and cigarettes.  He has smoked for the past 65.00 years. He has never used smokeless tobacco. He reports that he drinks alcohol. He reports that he does not use drugs.  No Known Allergies  Family History  Problem Relation Age of Onset  . Hypertension Mother   . Hypertension Brother      Prior to Admission medications   Medication Sig Start Date End Date Taking? Authorizing Provider  amLODipine (NORVASC) 5 MG tablet Take 5 mg by mouth daily. 02/20/16  Yes [provider]  aspirin EC 81 MG tablet Take 81 mg by mouth daily.   Yes [provider]  carvedilol (COREG) 12.5 MG tablet Take 12.5 mg by mouth 2 (two) times daily with a meal.    Yes [provider]  Cholecalciferol (VITAMIN D3) 2000 UNITS TABS Take 1,000 mg by mouth daily.    Yes [provider]  fenofibrate 160 MG tablet Take 160 mg by mouth daily. 08/31/15  Yes [provider]  furosemide (LASIX) 20 MG tablet Take 20 mg by mouth daily.   Yes [provider]  LUTEIN-ZEAXANTHIN PO Take  by mouth.   Yes [provider]  rosuvastatin (CRESTOR) 40 MG tablet Take 40 mg by mouth daily.   Yes [provider]  tamsulosin (FLOMAX) 0.4 MG CAPS capsule Take 0.4 mg by mouth daily after breakfast.  10/28/14  Yes [provider]    Physical Exam: Wt Readings from Last 3 Encounters:  03/13/18 60.1 kg (132 lb 8 oz)  08/18/17 61.2 kg (135 lb)  05/19/17 60.5 kg (133 lb 6.4 oz)   Vitals:   03/13/18 1100 03/13/18 1200 03/13/18 1216 03/13/18 1230  BP: (!) 143/66 (!) 155/64 (!) 145/103 (!) 144/67  Pulse: 68 77 81 73  Resp: (!) 22 16 (!) 23 19  Temp:   97.7 F (36.5 C)   TempSrc:   Oral   SpO2: 98% 100% 100% 99%    Weight:          Constitutional: NAD, calm, comfortable Eyes: PERTLA, lids and conjunctivae normal ENMT: Mucous membranes are dry. Posterior pharynx clear of any exudate or lesions. Normal dentition.  Neck: normal, supple, no masses, no thyromegaly Respiratory: clear to auscultation bilaterally, no wheezing, no crackles. Normal respiratory effort. No accessory muscle use.  Cardiovascular: S1 & S2 heard, regular rate and rhythm, no murmurs / rubs / gallops. No extremity edema. 2+ pedal pulses. No carotid bruits.  Abdomen: No distension, no tenderness, no masses palpated. No hepatosplenomegaly. Bowel sounds normal.  Musculoskeletal: no clubbing / cyanosis. No joint deformity upper and lower extremities. Good ROM, no contractures. Normal muscle tone.  Skin: no rashes, lesions, ulcers. No induration Neurologic: CN 2-12 grossly intact. Sensation intact, DTR normal. Strength 5/5 in all 4 limbs.  Psychiatric: Normal judgment and insight. Alert and oriented x 3. Normal mood.     Labs on Admission: I have personally reviewed following labs and imaging studies  CBC: Recent Labs  Lab 03/13/18 1031 03/13/18 1102  WBC 6.1  --   NEUTROABS 3.9  --   HGB 7.7* 7.5*  HCT 23.8* 22.0*  MCV 86.2  --   PLT 135*  --    Basic Metabolic Panel: Recent Labs  Lab 03/13/18 1031 03/13/18 1102  NA 144 142  K 3.8 3.8  CL 108 106  CO2 28  --   GLUCOSE 145* 137*  BUN 80* 78*  CREATININE 1.61* 1.50*  CALCIUM 9.1  --    GFR: CrCl cannot be calculated (Unknown ideal weight.). Liver Function Tests: Recent Labs  Lab 03/13/18 1031  AST 36  ALT 18  ALKPHOS 32*  BILITOT 0.8  PROT 5.8*  ALBUMIN 3.0*   No results for input(s): LIPASE, AMYLASE in the last 168 hours. No results for input(s): AMMONIA in the last 168 hours. Coagulation Profile: Recent Labs  Lab 03/13/18 1031  INR 1.32   Cardiac Enzymes: No results for input(s): CKTOTAL, CKMB, CKMBINDEX, TROPONINI in the last 168 hours. BNP  (last 3 results) No results for input(s): PROBNP in the last 8760 hours. HbA1C: No results for input(s): HGBA1C in the last 72 hours. CBG: No results for input(s): GLUCAP in the last 168 hours. Lipid Profile: No results for input(s): CHOL, HDL, LDLCALC, TRIG, CHOLHDL, LDLDIRECT in the last 72 hours. Thyroid Function Tests: No results for input(s): TSH, T4TOTAL, FREET4, T3FREE, THYROIDAB in the last 72 hours. Anemia Panel: No results for input(s): VITAMINB12, FOLATE, FERRITIN, TIBC, IRON, RETICCTPCT in the last 72 hours. Urine analysis:    Component Value Date/Time   COLORURINE YELLOW 08/18/2017 1419   APPEARANCEUR HAZY (A) 08/18/2017 1419  LABSPEC 1.015 08/18/2017 1419   PHURINE 5.0 08/18/2017 1419   GLUCOSEU NEGATIVE 08/18/2017 1419   HGBUR NEGATIVE 08/18/2017 1419   BILIRUBINUR NEGATIVE 08/18/2017 1419   KETONESUR 5 (A) 08/18/2017 1419   PROTEINUR NEGATIVE 08/18/2017 1419   UROBILINOGEN 0.2 12/19/2014 1249   NITRITE NEGATIVE 08/18/2017 1419   LEUKOCYTESUR LARGE (A) 08/18/2017 1419   Sepsis Labs: '@LABRCNTIP'$ (procalcitonin:4,lacticidven:4) )No results found for this or any previous visit (from the past 240 hour(s)).   Radiological Exams on Admission: No results found.  EKG: Independently reviewed. NSR 75 bpm  Assessment/Plan Principal Problem:   GI bleed- Melena - despite 3 wks of bleeding he is still hemodynamically stable  - IV Protonix started - d/c Lasix and give slow IVF  -  GI PA has seen the patient - EGD to be done but not sure today or tomorrow  Active Problems: Anemia with acute blood loss - transfuse 1 U PRBC for Hb of 7.5 - follow H and H Q 12  - anemia panel ordered    HTN (hypertension) - cont Coreg, Norvasc as BP is still high despite bleed    AAA (abdominal aortic aneurysm)   - 4.1 CM    CKD 3   - stable  H/o RAS  - bilateral stents  BPH  Flomax    DVT prophylaxis: SCDs Code Status: Full code Family Communication: Wells Guiles  [place] [phone]  Disposition Plan: admit to med/surg Consults called: GI  Admission status: inpatient    Debbe Odea MD [facility] Pager: [email or web address] Password [email or web address] 7PM-7AM, please contact night-coverage   03/13/2018, 1:12 PM

## 2018-03-14 DIAGNOSIS — K250 Acute gastric ulcer with hemorrhage: Principal | ICD-10-CM

## 2018-03-14 LAB — BASIC METABOLIC PANEL
Anion gap: 7 (ref 5–15)
BUN: 47 mg/dL — AB (ref 8–23)
CALCIUM: 8.3 mg/dL — AB (ref 8.9–10.3)
CHLORIDE: 112 mmol/L — AB (ref 98–111)
CO2: 25 mmol/L (ref 22–32)
CREATININE: 1.3 mg/dL — AB (ref 0.61–1.24)
GFR calc Af Amer: 56 mL/min — ABNORMAL LOW (ref >60)
GFR calc non Af Amer: 48 mL/min — ABNORMAL LOW (ref >60)
Glucose, Bld: 155 mg/dL — ABNORMAL HIGH (ref 70–99)
Potassium: 3.4 mmol/L — ABNORMAL LOW (ref 3.5–5.1)
SODIUM: 144 mmol/L (ref 135–145)

## 2018-03-14 LAB — CBC
HCT: 24.1 % — ABNORMAL LOW (ref 39.0–52.0)
HCT: 25.4 % — ABNORMAL LOW (ref 39.0–52.0)
HEMOGLOBIN: 8.2 g/dL — AB (ref 13.0–17.0)
Hemoglobin: 7.8 g/dL — ABNORMAL LOW (ref 13.0–17.0)
MCH: 28.3 pg (ref 26.0–34.0)
MCH: 28.7 pg (ref 26.0–34.0)
MCHC: 32.4 g/dL (ref 30.0–36.0)
MCHC: 32.3 g/dL (ref 30.0–36.0)
MCV: 87.3 fL (ref 78.0–100.0)
MCV: 88.8 fL (ref 78.0–100.0)
PLATELETS: 114 10*3/uL — AB (ref 150–400)
Platelets: 108 10*3/uL — ABNORMAL LOW (ref 150–400)
RBC: 2.76 MIL/uL — ABNORMAL LOW (ref 4.22–5.81)
RBC: 2.86 MIL/uL — ABNORMAL LOW (ref 4.22–5.81)
RDW: 16.7 % — AB (ref 11.5–15.5)
RDW: 16.6 % — ABNORMAL HIGH (ref 11.5–15.5)
WBC: 5.3 10*3/uL (ref 4.0–10.5)
WBC: 5.2 10*3/uL (ref 4.0–10.5)

## 2018-03-14 LAB — OCCULT BLOOD X 1 CARD TO LAB, STOOL: Fecal Occult Bld: POSITIVE — AB

## 2018-03-14 MED ORDER — PANTOPRAZOLE SODIUM 40 MG PO TBEC
40.0000 mg | DELAYED_RELEASE_TABLET | Freq: Two times a day (BID) | ORAL | Status: DC
  Administered 2018-03-14 – 2018-03-15 (×3): 40 mg via ORAL
  Filled 2018-03-14 (×3): qty 1

## 2018-03-14 MED ORDER — POTASSIUM CHLORIDE 20 MEQ PO PACK
40.0000 meq | PACK | Freq: Once | ORAL | Status: AC
  Administered 2018-03-14: 40 meq via ORAL
  Filled 2018-03-14: qty 2

## 2018-03-14 NOTE — Progress Notes (Signed)
PROGRESS NOTE  WEAVER TWEED EXB:[id number] DOB: [birth year] DOA: 03/13/2018 PCP: Lawerance Cruel, MD  HPI  Chris Peterson is a 84 y.o. year old male with medical history significant for bilateral RAS status post stenting, CKD stage III, AAA who presented on 03/13/2018 with 2 to 3 weeks of black stools.  Patient denied any NSAID use other than his baby aspirin, no abdominal pain, no nausea, no heartburn or reflux.    Brief narrative He presented with melanotic stool.  Was found to have a hemoglobin of 7.5.  GI was consulted and underwent EGD on 7/26 found to have gastric ulcer with nonbleeding visible vessel treated with APC.     Subjective No acute complaints overnight.  Denies any abdominal pain or nausea.  Had BM overnight unsure if it was black.  Assessment/Plan: Principal Problem:   GI bleed Active Problems:   HTN (hypertension)   AAA (abdominal aortic aneurysm) (HCC)   CKD (chronic kidney disease)   Anemia associated with acute blood loss   Melena   Acute gastric ulcer with hemorrhage   Acute blood loss anemia secondary to gastric ulcer with hemorrhage - 7.7 on admission.  Received 1 unit of packed red blood cells on 7/26.  Briefly went up to 8.4 now back to 7.8 today -Continue to monitor BMs -GI recommends discontinuing PPI infusion for oral PPI twice daily, will await further recommendations -If hemoglobin remains stable without any further signs of melena anticipate discharge next 24 hours  Hypokalemia -Potassium 3.4 on 7/27 -Previously on IV fluids with potassium supplementation -Try oral potassium now tolerating oral diet  Hypertension, at goal -Continue home Coreg and Norvasc  CKD, stage III -Creatinine stable at baseline  BPH, stable - Home Flomax    Code Status: Full code  Family Communication: No family at bedside  Disposition Plan: Continue to monitor the next 24 hours anticipate discharge over next day pending GI recommendations and  stabilization of hemoglobin and improvement in stool   Consultants:   Treatment Team:   Ladene Artist, MD    Procedures:  EGD-7/26  Antimicrobials: Anti-infectives (From admission, onward)   None         Cultures:    Telemetry:  DVT prophylaxis: SCDs   Objective: Vitals:   03/13/18 1647 03/13/18 1649 03/13/18 2057 03/14/18 0528  BP: 137/65 (!) 145/63 (!) 144/76 (!) 144/71  Pulse: 75 64 65 (!) 58  Resp:   16 18  Temp:   98 F (36.7 C) 98 F (36.7 C)  TempSrc:      SpO2: 97% 100% 98% 98%  Weight:      Height:        Intake/Output Summary (Last 24 hours) at 03/14/2018 1346 Last data filed at 03/14/2018 0300 Gross per 24 hour  Intake 482.5 ml  Output 1400 ml  Net -917.5 ml   Filed Weights   03/13/18 1005 03/13/18 1354 03/13/18 1458  Weight: 60.1 kg (132 lb 8 oz) 60.1 kg (132 lb 8 oz) 59.9 kg (132 lb)    Exam:  Constitutional:normal appearing elderly male Eyes: EOMI, anicteric, normal conjunctivae ENMT: Oropharynx with moist mucous membranes, normal dentition Cardiovascular: RRR no MRGs, with no peripheral edema Respiratory: Normal respiratory effort on room air, clear breath sounds  Abdomen: Soft,non-tender, normal bowel sounds Skin: No rash ulcers, or lesions. Without skin tenting  Neurologic: Grossly no focal neuro deficit. Psychiatric:Appropriate affect, and mood. Mental status AAOx3  Data Reviewed: CBC: Recent Labs  Lab 03/13/18 1031  03/13/18 1102 03/13/18 2000 03/14/18 0859  WBC 6.1  --  6.7 5.3  NEUTROABS 3.9  --   --   --   HGB 7.7* 7.5* 8.4* 7.8*  HCT 23.8* 22.0* 25.2* 24.1*  MCV 86.2  --  86.3 87.3  PLT 135*  --  121* 888*   Basic Metabolic Panel: Recent Labs  Lab 03/13/18 1031 03/13/18 1102 03/14/18 0859  NA 144 142 144  K 3.8 3.8 3.4*  CL 108 106 112*  CO2 28  --  25  GLUCOSE 145* 137* 155*  BUN 80* 78* 47*  CREATININE 1.61* 1.50* 1.30*  CALCIUM 9.1  --  8.3*   GFR: Estimated Creatinine Clearance: 35.2  mL/min (A) (by C-G formula based on SCr of 1.3 mg/dL (H)). Liver Function Tests: Recent Labs  Lab 03/13/18 1031  AST 36  ALT 18  ALKPHOS 32*  BILITOT 0.8  PROT 5.8*  ALBUMIN 3.0*   No results for input(s): LIPASE, AMYLASE in the last 168 hours. No results for input(s): AMMONIA in the last 168 hours. Coagulation Profile: Recent Labs  Lab 03/13/18 1031  INR 1.32   Cardiac Enzymes: No results for input(s): CKTOTAL, CKMB, CKMBINDEX, TROPONINI in the last 168 hours. BNP (last 3 results) No results for input(s): PROBNP in the last 8760 hours. HbA1C: No results for input(s): HGBA1C in the last 72 hours. CBG: No results for input(s): GLUCAP in the last 168 hours. Lipid Profile: No results for input(s): CHOL, HDL, LDLCALC, TRIG, CHOLHDL, LDLDIRECT in the last 72 hours. Thyroid Function Tests: No results for input(s): TSH, T4TOTAL, FREET4, T3FREE, THYROIDAB in the last 72 hours. Anemia Panel: Recent Labs    03/13/18 2000  VITAMINB12 478  FOLATE 9.7  FERRITIN 56  TIBC 359  IRON 89  RETICCTPCT 3.4*   Urine analysis:    Component Value Date/Time   COLORURINE YELLOW 08/18/2017 1419   APPEARANCEUR HAZY (A) 08/18/2017 1419   LABSPEC 1.015 08/18/2017 1419   PHURINE 5.0 08/18/2017 1419   GLUCOSEU NEGATIVE 08/18/2017 1419   HGBUR NEGATIVE 08/18/2017 1419   BILIRUBINUR NEGATIVE 08/18/2017 1419   KETONESUR 5 (A) 08/18/2017 1419   PROTEINUR NEGATIVE 08/18/2017 1419   UROBILINOGEN 0.2 12/19/2014 1249   NITRITE NEGATIVE 08/18/2017 1419   LEUKOCYTESUR LARGE (A) 08/18/2017 1419   Sepsis Labs: @LABRCNTIP (procalcitonin:4,lacticidven:4)  )No results found for this or any previous visit (from the past 240 hour(s)).    Studies: No results found.  Scheduled Meds: . sodium chloride   Intravenous Once  . amLODipine  5 mg Oral Daily  . carvedilol  12.5 mg Oral BID WC  . fenofibrate  160 mg Oral Daily  . pantoprazole  40 mg Oral BID  . rosuvastatin  40 mg Oral Daily  .  tamsulosin  0.4 mg Oral QPC breakfast    Continuous Infusions: . sodium chloride 50 mL/hr at 03/13/18 1652  . 0.9 % NaCl with KCl 20 mEq / L       LOS: 1 day     Desiree Hane, MD [facility] Pager [phone]  If 7PM-7AM, please contact night-coverage [email or web address] Password [email or web address] 03/14/2018, 1:46 PM

## 2018-03-14 NOTE — Progress Notes (Addendum)
Progress Note   Subjective  Chief Complaint: Melena, acute blood loss anemia  This morning reports feeling well, he has not had a bowel movement since yesterday.  Patient is eager to "get out of the hospital".  Daughter by bedside he does ask many questions regarding recent procedure and findings.    Objective   Vital signs in last 24 hours: Temp:  [97.5 F (36.4 C)-98.3 F (36.8 C)] 98 F (36.7 C) (07/27 0528) Pulse Rate:  [58-81] 58 (07/27 0528) Resp:  [14-26] 18 (07/27 0528) BP: (113-169)/(56-114) 144/71 (07/27 0528) SpO2:  [97 %-100 %] 98 % (07/27 0528) Weight:  [132 lb (59.9 kg)-132 lb 8 oz (60.1 kg)] 132 lb (59.9 kg) (07/26 1458) Last BM Date: 03/13/18 General:   Elderly white male in NAD Heart:  Regular rate and rhythm; no murmurs Lungs: Respirations even and unlabored, lungs CTA bilaterally Abdomen:  Soft, nontender and nondistended. Normal bowel sounds. Extremities:  Without edema. Neurologic:  Alert and oriented,  grossly normal neurologically. Psych:  Cooperative. Normal mood and affect.  Intake/Output from previous day: 07/26 0701 - 07/27 0700 In: 1482.5 [I.V.:140; Blood:342.5; IV Piggyback:1000] Out: 1400 [Urine:1400]  Lab Results: Recent Labs    03/13/18 1031 03/13/18 1102 03/13/18 2000  WBC 6.1  --  6.7  HGB 7.7* 7.5* 8.4*  HCT 23.8* 22.0* 25.2*  PLT 135*  --  121*   BMET Recent Labs    03/13/18 1031 03/13/18 1102  NA 144 142  K 3.8 3.8  CL 108 106  CO2 28  --   GLUCOSE 145* 137*  BUN 80* 78*  CREATININE 1.61* 1.50*  CALCIUM 9.1  --    LFT Recent Labs    03/13/18 1031  PROT 5.8*  ALBUMIN 3.0*  AST 36  ALT 18  ALKPHOS 32*  BILITOT 0.8   PT/INR Recent Labs    03/13/18 1031  LABPROT 16.3*  INR 1.32   EGD 03/13/2018, Dr. Fuller Plan: Findings:      The examined esophagus was normal.      One non-bleeding cratered gastric ulcer with a visible vessel was found       in the gastric antrum. The lesion was 6 mm in largest dimension.        Coagulation for hemostasis using bipolar probe was successful.      A few localized, small non-bleeding erosions were found in the gastric       antrum. There were no stigmata of recent bleeding. Biopsies were taken       with a cold forceps for histology.      A small hiatal hernia was present.      The exam of the stomach was otherwise normal.      The duodenal bulb and second portion of the duodenum were normal. Impression:               - Normal esophagus.                           - Non-bleeding gastric ulcer with a visible vessel.                            Treated with bipolar cautery.                           - Non-bleeding erosive gastropathy. Biopsied.                           -  Small hiatal hernia.                           - Normal duodenal bulb and second portion of the                            duodenum. Recommendation:           - Return patient to hospital ward for ongoing care.                           - Full liquid diet today.                           - Continue present medications.                           - Await pathology results.                           - No aspirin, ibuprofen, naproxen, or other                            non-steroidal anti-inflammatory drugs.    Assessment / Plan:   Assessment: 1.  Melena and acute blood loss anemia: Melena x4 weeks, hemoglobin 7.5 admission--> 1 unit PRBCs--> 8.4 this morning, EGD yesterday with visible vessel and a gastric ulcer, likely cause, treated with APC, no further melena overnight  Plan: 1.  Stopped PPI drip- started Pantoprazole 40mg  PO BID-continue for 4-6 weeks 2.  Continue to monitor hemoglobin and transfusion as needed 3.  Patient may advance diet as tolerated 4.  Discussed with patient that if he has no further signs of melena and his hemoglobin remained stable he may likely be discharged Sunday morning 5.  Please await any final recommendations from Dr. Loletha Carrow later today, we will likely sign  off  Discharge Planning Diet: Advance as tolerated, discussed antireflux diet Discharge Medications: Continue Pantoprazole 40 mg twice daily x4 weeks Follow up: Patient will need follow-up in our outpatient clinic in 2-3 weeks, patient will be contacted  Procedure Procedures planned and timing of procedure: Patient will need repeat EGD in 4-6 weeks for surveillance of healing, we will arrange  Thank you for your kind consultation.   LOS: 1 day   Levin Erp  03/14/2018, 9:10 AM  Pager # [phone]

## 2018-03-15 LAB — CBC
HCT: 26.8 % — ABNORMAL LOW (ref 39.0–52.0)
Hemoglobin: 8.7 g/dL — ABNORMAL LOW (ref 13.0–17.0)
MCH: 28.9 pg (ref 26.0–34.0)
MCHC: 32.5 g/dL (ref 30.0–36.0)
MCV: 89 fL (ref 78.0–100.0)
Platelets: 123 10*3/uL — ABNORMAL LOW (ref 150–400)
RBC: 3.01 MIL/uL — ABNORMAL LOW (ref 4.22–5.81)
RDW: 17 % — AB (ref 11.5–15.5)
WBC: 5.9 10*3/uL (ref 4.0–10.5)

## 2018-03-15 MED ORDER — PANTOPRAZOLE SODIUM 40 MG PO TBEC: 40.0000 mg | DELAYED_RELEASE_TABLET | Freq: Two times a day (BID) | ORAL | 0 refills | Status: DC

## 2018-03-15 NOTE — Progress Notes (Signed)
Pt leaving this afternoon with his daughter. Alert, oriented, and without c/o. Discharge instructions given/explained with pt and daughter verbalizing understanding. Pt and daughter aware of followup appointments.

## 2018-03-15 NOTE — Discharge Summary (Signed)
Discharge Summary  Chris Peterson GDJ:[id number] DOB: 20-Sep-1929  PCP: Lawerance Cruel, MD  Admit date: 03/13/2018 Discharge date: 03/15/2018   Time spent: < 25 minutes  Admitted From: Home Disposition: Home  Recommendations for Outpatient Follow-up:  1. Follow up with Dr. Fuller Plan, GI, to be arranged after gastric biopsies available 2. Will need repeat EGD in 4 to 6 weeks for surveillance of healing, GI will arrange 3. Continue Protonix 40 mg twice daily x4 weeks 4. GI follow-up in 2 to 3 weeks, patient will be contacted per GI 5. Discontinue aspirin     Discharge Diagnoses:  Active Hospital Problems   Diagnosis Date Noted  . GI bleed 03/13/2018  . Anemia associated with acute blood loss 03/13/2018  . Melena   . Acute gastric ulcer with hemorrhage   . CKD (chronic kidney disease) 11/28/2011  . AAA (abdominal aortic aneurysm) ([place]) 11/28/2011  . HTN (hypertension) 11/28/2011    Resolved Hospital Problems  No resolved problems to display.    Discharge Condition: Stable  CODE STATUS: Full code   History of present illness:  Chris Peterson is a 84 y.o. year old male with medical history significant for bilateral RAS status post stenting, CKD stage III, AAA who presented on 03/13/2018 with 2 to 3 weeks of black stools.  Patient denied any NSAID use other than his baby aspirin, no abdominal pain, no nausea, no heartburn or reflux.     Hospital Course:   Acute blood loss anemia secondary to gastric ulcer with hemorrhage -Hemoglobin of 7.7 on admission, status post 1 unit packed red blood cells on 7/26, initially maintained on IV Protonix drip until EGD was obtained - EGD on 7/26: Gastric ulcer with visible nonbleeding vessel, APC applied patient to continue Protonix 40 mg twice daily for 4 weeks per GI recommendations - Hemoglobin remained stable at 8 with no repeat episodes of melena after EGD prior to discharge - Discontinued aspirin  Hypokalemia - 3.4 on 7/27 -  Given oral supplementation -Repeat BMP on PCP follow-up   Consultations:  Gastroenterology  Procedures/Studies: 7/26 EGD: Findings One non-bleeding cratered gastric ulcer with a visible vessel was found in the gastric antrum. The lesion was 6 mm in largest dimension. Coagulation for hemostasis using bipolar probe was successful. A few localized, small non-bleeding erosions were found in the gastric antrum. There were no stigmata of recent bleeding. Biopsies were taken with a cold forceps for histology. A small hiatal hernia was present. The exam of the stomach was otherwise normal. The duodenal bulb and second portion of the duodenum were normal.  Discharge Exam: BP (!) 143/67 (BP Location: Left Arm)   Pulse 65   Temp 97.6 F (36.4 C) (Oral)   Resp 17   Ht 5\' 9"  (1.753 m)   Wt 59.9 kg (132 lb)   SpO2 99%   BMI 19.49 kg/m   General: Lying in bed, no apparent distress Eyes: EOMI, anicteric ENT: Oral Mucosa clear and moist Cardiovascular: regular rate and rhythm, no murmurs, rubs or gallops, no edema, Respiratory: Normal respiratory effort, lungs clear to auscultation bilaterally Abdomen: soft, non-distended, non-tender, normal bowel sounds Skin: No Rash Neurologic: Grossly no focal neuro deficit.Mental status AAOx3, speech normal, Psychiatric:Appropriate affect, and mood   Discharge Instructions You were cared for by a hospitalist during your hospital stay. If you have any questions about your discharge medications or the care you received while you were in the hospital after you are discharged, you can call the unit  and asked to speak with the hospitalist on call if the hospitalist that took care of you is not available. Once you are discharged, your primary care physician will handle any further medical issues. Please note that NO REFILLS for any discharge medications will be authorized once you are discharged, as it is imperative that you return to your primary care  physician (or establish a relationship with a primary care physician if you do not have one) for your aftercare needs so that they can reassess your need for medications and monitor your lab values.  Discharge Instructions    Diet - low sodium heart healthy   Complete by:  As directed    Increase activity slowly   Complete by:  As directed      Allergies as of 03/15/2018   No Known Allergies     Medication List    STOP taking these medications   aspirin EC 81 MG tablet     TAKE these medications   amLODipine 5 MG tablet Commonly known as:  NORVASC Take 5 mg by mouth daily.   carvedilol 12.5 MG tablet Commonly known as:  COREG Take 12.5 mg by mouth 2 (two) times daily with a meal.   fenofibrate 160 MG tablet Take 160 mg by mouth daily.   furosemide 20 MG tablet Commonly known as:  LASIX Take 20 mg by mouth daily.   LUTEIN-ZEAXANTHIN PO Take by mouth.   pantoprazole 40 MG tablet Commonly known as:  PROTONIX Take 1 tablet (40 mg total) by mouth 2 (two) times daily.   rosuvastatin 40 MG tablet Commonly known as:  CRESTOR Take 40 mg by mouth daily.   tamsulosin 0.4 MG Caps capsule Commonly known as:  FLOMAX Take 0.4 mg by mouth daily after breakfast.   Vitamin D3 2000 units Tabs Take 1,000 mg by mouth daily.      No Known Allergies    The results of significant diagnostics from this hospitalization (including imaging, microbiology, ancillary and laboratory) are listed below for reference.    Significant Diagnostic Studies: No results found.  Microbiology: No results found for this or any previous visit (from the past 240 hour(s)).   Labs: Basic Metabolic Panel: Recent Labs  Lab 03/13/18 1031 03/13/18 1102 03/14/18 0859  NA 144 142 144  K 3.8 3.8 3.4*  CL 108 106 112*  CO2 28  --  25  GLUCOSE 145* 137* 155*  BUN 80* 78* 47*  CREATININE 1.61* 1.50* 1.30*  CALCIUM 9.1  --  8.3*   Liver Function Tests: Recent Labs  Lab 03/13/18 1031  AST 36   ALT 18  ALKPHOS 32*  BILITOT 0.8  PROT 5.8*  ALBUMIN 3.0*   No results for input(s): LIPASE, AMYLASE in the last 168 hours. No results for input(s): AMMONIA in the last 168 hours. CBC: Recent Labs  Lab 03/13/18 1031 03/13/18 1102 03/13/18 2000 03/14/18 0859 03/14/18 1954 03/15/18 0842  WBC 6.1  --  6.7 5.3 5.2 5.9  NEUTROABS 3.9  --   --   --   --   --   HGB 7.7* 7.5* 8.4* 7.8* 8.2* 8.7*  HCT 23.8* 22.0* 25.2* 24.1* 25.4* 26.8*  MCV 86.2  --  86.3 87.3 88.8 89.0  PLT 135*  --  121* 114* 108* 123*   Cardiac Enzymes: No results for input(s): CKTOTAL, CKMB, CKMBINDEX, TROPONINI in the last 168 hours. BNP: BNP (last 3 results) No results for input(s): BNP in the last 8760 hours.  ProBNP (last 3 results) No results for input(s): PROBNP in the last 8760 hours.  CBG: No results for input(s): GLUCAP in the last 168 hours.     Signed:  Desiree Hane, MD [facility] 03/15/2018, 9:25 AM

## 2018-03-15 NOTE — Progress Notes (Addendum)
    Progress Note   Subjective  Chief Complaint: Bleeding gastric ulcer  Reports two stools this morning which were "no longer black". Overall feels much abetter and was able to walk up and down the hall this morning. He is eager to go home.    Objective   Vital signs in last 24 hours: Temp:  [97.6 F (36.4 C)-98.2 F (36.8 C)] 97.6 F (36.4 C) (07/28 0508) Pulse Rate:  [64-73] 65 (07/28 0508) Resp:  [17-18] 17 (07/28 0508) BP: (126-143)/(56-71) 143/67 (07/28 0508) SpO2:  [98 %-100 %] 99 % (07/28 0508) Last BM Date: 03/14/18 General:   Elderly  white male in NAD Heart:  Regular rate and rhythm; no murmurs Lungs: Respirations even and unlabored, lungs CTA bilaterally Abdomen:  Soft, nontender and nondistended. Normal bowel sounds. Extremities:  Without edema. Neurologic:  Alert and oriented,  grossly normal neurologically. Psych:  Cooperative. Normal mood and affect.  Intake/Output from previous day: 07/27 0701 - 07/28 0700 In: -  Out: 450 [Urine:450]  Lab Results: Recent Labs    03/14/18 0859 03/14/18 1954 03/15/18 0842  WBC 5.3 5.2 5.9  HGB 7.8* 8.2* 8.7*  HCT 24.1* 25.4* 26.8*  PLT 114* 108* 123*   BMET Recent Labs    03/13/18 1031 03/13/18 1102 03/14/18 0859  NA 144 142 144  K 3.8 3.8 3.4*  CL 108 106 112*  CO2 28  --  25  GLUCOSE 145* 137* 155*  BUN 80* 78* 47*  CREATININE 1.61* 1.50* 1.30*  CALCIUM 9.1  --  8.3*   LFT Recent Labs    03/13/18 1031  PROT 5.8*  ALBUMIN 3.0*  AST 36  ALT 18  ALKPHOS 32*  BILITOT 0.8   PT/INR Recent Labs    03/13/18 1031  LABPROT 16.3*  INR 1.32     Assessment / Plan:   Assessment: 1.  Melena and acute blood loss anemia: Melena x4 weeks, hemoglobin 7.5 admission--> 1 unit PRBCs--> 8.4-->8.7 this morning, EGD 7/26 with visible vessel and a gastric ulcer, likely cause, treated with APC, no further melena   Plan: 1.  Continue Pantoprazole 40mg  PO BID  for 4-6 weeks 2.  Patient may advance diet as  tolerated 3. Explained to patient that he would be contacted by our office regarding follow up OV.  4. Patient may be discharged from GI perspective. We will sign off.  Discharge Planning Diet: Advance as tolerated, discussed antireflux diet Discharge Medications: Continue Pantoprazole 40 mg twice daily x4 weeks Follow up: Patient will need follow-up in our outpatient clinic in 2-3 weeks, patient will be contacted  Procedure Procedures planned and timing of procedure: Patient will need repeat EGD in 4-6 weeks for surveillance of healing, we will arrange  Thank you for your kind consultation.   LOS: 2 days   Levin Erp  03/15/2018, 9:17 AM  Pager # [phone]  I have discussed the case with the PA, and that is the plan I formulated. I personally interviewed and examined the patient.  No recurrent bleeding - home today for clinic follow up with Dr. Fuller Plan to be arranged after gastric biopsies available.    Nelida Meuse III Office: [phone]

## 2018-03-16 ENCOUNTER — Encounter: Payer: Self-pay | Admitting: Gastroenterology

## 2018-03-16 ENCOUNTER — Telehealth: Payer: Self-pay

## 2018-03-16 ENCOUNTER — Encounter (HOSPITAL_COMMUNITY): Payer: Self-pay | Admitting: Gastroenterology

## 2018-03-16 NOTE — Telephone Encounter (Signed)
Follow up with Chris Peterson on 03/30/18 at 315 pm The pt has been advised.

## 2018-03-16 NOTE — Telephone Encounter (Signed)
-----   Message from [place], [place] sent at 03/14/2018  9:15 AM EDT ----- Regarding: Needs followup 2-3 weeks Needs follow up with me in clinic in 2-3 weeks (other app if I am unavailable) for follow up gastric ulcer and will need repeat EGD for surveillance. Will need to be called with appt- likely be discharged 7/28.  Thanks-JLL

## 2018-03-17 LAB — TYPE AND SCREEN
ABO/RH(D): A POS
ANTIBODY SCREEN: NEGATIVE
Unit division: 0
Unit division: 0

## 2018-03-17 LAB — BPAM RBC
BLOOD PRODUCT EXPIRATION DATE: 201908132359
BLOOD PRODUCT EXPIRATION DATE: 201908132359
ISSUE DATE / TIME: 201907261352
UNIT TYPE AND RH: 6200
Unit Type and Rh: 6200

## 2018-03-26 ENCOUNTER — Other Ambulatory Visit: Payer: Self-pay | Admitting: Cardiovascular Disease

## 2018-03-26 DIAGNOSIS — I714 Abdominal aortic aneurysm, without rupture, unspecified: Secondary | ICD-10-CM

## 2018-03-26 DIAGNOSIS — Z9889 Other specified postprocedural states: Secondary | ICD-10-CM

## 2018-03-30 ENCOUNTER — Other Ambulatory Visit (INDEPENDENT_AMBULATORY_CARE_PROVIDER_SITE_OTHER): Payer: MEDICARE

## 2018-03-30 ENCOUNTER — Encounter: Payer: Self-pay | Admitting: Physician Assistant

## 2018-03-30 ENCOUNTER — Ambulatory Visit (INDEPENDENT_AMBULATORY_CARE_PROVIDER_SITE_OTHER): Payer: MEDICARE | Admitting: Physician Assistant

## 2018-03-30 VITALS — BP 128/62 | HR 68 | Ht 69.0 in | Wt 135.8 lb

## 2018-03-30 DIAGNOSIS — Z8711 Personal history of peptic ulcer disease: Secondary | ICD-10-CM

## 2018-03-30 DIAGNOSIS — D62 Acute posthemorrhagic anemia: Secondary | ICD-10-CM

## 2018-03-30 DIAGNOSIS — Z8719 Personal history of other diseases of the digestive system: Secondary | ICD-10-CM

## 2018-03-30 LAB — COMPREHENSIVE METABOLIC PANEL
ALT: 15 U/L (ref 0–53)
AST: 39 U/L — AB (ref 0–37)
Albumin: 3.6 g/dL (ref 3.5–5.2)
Alkaline Phosphatase: 37 U/L — ABNORMAL LOW (ref 39–117)
BUN: 34 mg/dL — ABNORMAL HIGH (ref 6–23)
CHLORIDE: 107 meq/L (ref 96–112)
CO2: 28 mEq/L (ref 19–32)
Calcium: 8.9 mg/dL (ref 8.4–10.5)
Creatinine, Ser: 1.65 mg/dL — ABNORMAL HIGH (ref 0.40–1.50)
GFR: 42.28 mL/min — AB (ref >60.00)
GLUCOSE: 129 mg/dL — AB (ref 70–99)
Potassium: 3.8 mEq/L (ref 3.5–5.1)
Sodium: 142 mEq/L (ref 135–145)
Total Bilirubin: 0.6 mg/dL (ref 0.2–1.2)
Total Protein: 6.5 g/dL (ref 6.0–8.3)

## 2018-03-30 LAB — IBC PANEL
Iron: 32 ug/dL — ABNORMAL LOW (ref 42–165)
SATURATION RATIOS: 6.5 % — AB (ref 20.0–50.0)
TRANSFERRIN: 350 mg/dL (ref 212.0–360.0)

## 2018-03-30 LAB — CBC WITH DIFFERENTIAL/PLATELET
BASOS PCT: 0.6 % (ref 0.0–3.0)
Basophils Absolute: 0 10*3/uL (ref 0.0–0.1)
Eosinophils Absolute: 0.1 10*3/uL (ref 0.0–0.7)
Eosinophils Relative: 1.2 % (ref 0.0–5.0)
HEMATOCRIT: 26.9 % — AB (ref 39.0–52.0)
Hemoglobin: 8.9 g/dL — ABNORMAL LOW (ref 13.0–17.0)
Lymphocytes Relative: 34.4 % (ref 12.0–46.0)
Lymphs Abs: 1.5 10*3/uL (ref 0.7–4.0)
MCHC: 33.2 g/dL (ref 30.0–36.0)
MCV: 83.9 fl (ref 78.0–100.0)
MONOS PCT: 12.9 % — AB (ref 3.0–12.0)
Monocytes Absolute: 0.6 10*3/uL (ref 0.1–1.0)
Neutro Abs: 2.2 10*3/uL (ref 1.4–7.7)
Neutrophils Relative %: 50.9 % (ref 43.0–77.0)
Platelets: 157 10*3/uL (ref 150.0–400.0)
RBC: 3.21 Mil/uL — ABNORMAL LOW (ref 4.22–5.81)
RDW: 18.2 % — AB (ref 11.5–15.5)
WBC: 4.3 10*3/uL (ref 4.0–10.5)

## 2018-03-30 LAB — FERRITIN: FERRITIN: 31.2 ng/mL (ref 22.0–322.0)

## 2018-03-30 MED ORDER — PANTOPRAZOLE SODIUM 40 MG PO TBEC: 40.0000 mg | DELAYED_RELEASE_TABLET | Freq: Two times a day (BID) | ORAL | 3 refills | Status: DC

## 2018-03-30 NOTE — Progress Notes (Signed)
Reviewed and agree with management plan.  Nadean Montanaro T. Beryl Hornberger, MD FACG 

## 2018-03-30 NOTE — Progress Notes (Signed)
Chief Complaint: Follow-up hospital visit for acute blood loss anemia related to gastric ulcer  HPI:     Chris Peterson is an 84 year old male with a past medical history as listed below, who was referred to me by Lawerance Cruel, MD for follow-up after recent hospitalization for acute blood loss anemia related to a gastric ulcer.     Hospital stay from 03/13/2018-03/15/2018 for acute blood loss anemia.  03/13/2018 EGD with Dr. Fuller Plan showed a nonbleeding gastric ulcer with visible vessel which was treated with cautery and nonbleeding erosive gastropathy with a small hiatal hernia.  Last hemoglobin checked 03/15/2018 was 8.7.    Today, presents to clinic accompanied by his daughter who does assist with history.  Explains that he has been doing well since being discharged from the hospital other than feeling like he has a lack of energy and generalized fatigue.  Tells me that each day is slightly better but he just "cannot do what I used to".  Denies seeing any further black stools or having any abdominal discomfort.  Continues his Pantoprazole 40 mg twice daily.  Also tells me he has not been able to sleep through the night since discharge until last night.    Denies fever, chills, weight loss, anorexia, nausea, vomiting, heartburn or reflux.      Past Medical History:  Diagnosis Date  . AAA (abdominal aortic aneurysm) ([facility])    Infrarenal AAA - maximal diameter of 3.5 cm   . Atherosclerotic renal artery stenosis, bilateral ([facility])    Bilateral renal angiography 11/27/11, Right 70% prox, Left 90% prox s/p LRA PTA/Stenting - 6.0x11m ;  12/12/2011 Renal Duplex: Patent LRA stent & > 60% RRA ost stenosis  . Blood transfusion    S/P "lost blood due to hemorrhoids"  . Dysrhythmia    "fast one time"  . Hemorrhoids   . High cholesterol   . HTN (hypertension)   . Kidney disease, chronic, stage III (moderate, EGFR 30-59 ml/min) ([facility])   . Pericardial effusion    a. incidentally noted on renal u/s 11/2011;  b.   12/16/2011 Echo:  EF 55-60%, Gr1 DD, Mild MR, PASP 32, Mod circumferential peric effusion w/o hemodynamic compromise. Density in pericardial space - consider MRI  . [place] II diabetes mellitus [place]     Past Surgical History:  Procedure Laterality Date  . ABDOMINAL ANGIOGRAM N/A 11/27/2011   Procedure: ABDOMINAL ANGIOGRAM;  Surgeon: MSherren Mocha MD;  [facility];  Service: Cardiovascular;  Laterality: N/A;  . BIOPSY  03/13/2018   Procedure: BIOPSY;  Surgeon: SLadene Artist MD;  Location: [facility];  Service: Endoscopy;;  . CATARACT EXTRACTION W/ INTRAOCULAR LENS  IMPLANT, BILATERAL  1990's  . ESOPHAGOGASTRODUODENOSCOPY N/A 03/13/2018   Procedure: ESOPHAGOGASTRODUODENOSCOPY (EGD);  Surgeon: SLadene Artist MD;  [facility];  Service: Endoscopy;  Laterality: N/A;  . HOT HEMOSTASIS N/A 03/13/2018   Procedure: HOT HEMOSTASIS (ARGON PLASMA COAGULATION/BICAP);  Surgeon: SLadene Artist MD;  [facility];  Service: Endoscopy;  Laterality: N/A;  . NASAL SEPTUM SURGERY  1980's  . RENAL ANGIOGRAM Bilateral 11/27/2011   Procedure: RENAL ANGIOGRAM;  Surgeon: MSherren Mocha MD;  [facility];  Service: Cardiovascular;  Laterality: Bilateral;  possible PTA/stent  . RENAL ARTERY STENT  11/27/11    Current Outpatient Medications  Medication Sig Dispense Refill  . amLODipine (NORVASC) 5 MG tablet Take 5 mg by mouth daily.    . carvedilol (COREG) 12.5 MG tablet Take 12.5 mg by  mouth 2 (two) times daily with a meal.     . Cholecalciferol (VITAMIN D3) 2000 UNITS TABS Take 1,000 mg by mouth daily.     . fenofibrate 160 MG tablet Take 160 mg by mouth daily.    . furosemide (LASIX) 20 MG tablet Take 20 mg by mouth daily.    . LUTEIN-ZEAXANTHIN PO Take by mouth.    . pantoprazole (PROTONIX) 40 MG tablet Take 1 tablet (40 mg total) by mouth 2 (two) times daily. 60 tablet 0  . rosuvastatin (CRESTOR) 40 MG tablet Take 40 mg by mouth daily.    . tamsulosin (FLOMAX) 0.4  MG CAPS capsule Take 0.4 mg by mouth daily after breakfast.      No current facility-administered medications for this visit.     Allergies as of 03/30/2018  . (No Known Allergies)    Family History  Problem Relation Age of Onset  . Hypertension Mother   . Hypertension Brother     Social History   Socioeconomic History  . Marital status: Married    Spouse name: Not on file  . Number of children: Not on file  . Years of education: Not on file  . Highest education level: Not on file  Occupational History  . Not on file  Social Needs  . Financial resource strain: Not on file  . Food insecurity:    Worry: Not on file    Inability: Not on file  . Transportation needs:    Medical: Not on file    Non-medical: Not on file  Tobacco Use  . Smoking status: Current Every Day Smoker    Years: 65.00    Types: Pipe, Cigarettes    Last attempt to quit: 08/19/1996    Years since quitting: 21.6  . Smokeless tobacco: Never Used  . Tobacco comment: 11/27/11 "smoked pipe since 1998; quit cigarettes 1998"  Substance and Sexual Activity  . Alcohol use: Yes    Comment: 03/13/18 2-3 Oz daily   . Drug use: No  . Sexual activity: Never  Lifestyle  . Physical activity:    Days per week: Not on file    Minutes per session: Not on file  . Stress: Not on file  Relationships  . Social connections:    Talks on phone: Not on file    Gets together: Not on file    Attends religious service: Not on file    Active member of club or organization: Not on file    Attends meetings of clubs or organizations: Not on file    Relationship status: Not on file  . Intimate partner violence:    Fear of current or ex partner: Not on file    Emotionally abused: Not on file    Physically abused: Not on file    Forced sexual activity: Not on file  Other Topics Concern  . Not on file  Social History Narrative  . Not on file    Review of Systems:    Constitutional: No weight loss, fever or  chills Cardiovascular: No chest pain Respiratory: No SOB  Gastrointestinal: See HPI and otherwise negative  Physical Exam:  Vital signs: BP 128/62   Pulse 68   Ht _0  (1.753 m)   Wt 135 lb 12.8 oz (61.6 kg)   BMI 20.05 kg/m   Constitutional:   Pleasant Elderly Caucasian male appears to be in NAD, Well developed, Well nourished, alert and cooperative Respiratory: Respirations even and unlabored. Lungs clear to auscultation bilaterally.  No wheezes, crackles, or rhonchi.  Cardiovascular: Normal S1, S2. No MRG. Regular rate and rhythm. No peripheral edema, cyanosis or pallor.  Gastrointestinal:  Soft, nondistended, nontender. No rebound or guarding. Normal bowel sounds. No appreciable masses or hepatomegaly. Psychiatric: Demonstrates good judgement and reason without abnormal affect or behaviors.  RELEVANT LABS AND IMAGING: CBC    Component Value Date/Time   WBC 5.9 03/15/2018 0842   RBC 3.01 (L) 03/15/2018 0842   HGB 8.7 (L) 03/15/2018 0842   HCT 26.8 (L) 03/15/2018 0842   PLT 123 (L) 03/15/2018 0842   MCV 89.0 03/15/2018 0842   MCH 28.9 03/15/2018 0842   MCHC 32.5 03/15/2018 0842   RDW 17.0 (H) 03/15/2018 0842   LYMPHSABS 2.0 03/13/2018 1031   MONOABS 0.2 03/13/2018 1031   EOSABS 0.0 03/13/2018 1031   BASOSABS 0.0 03/13/2018 1031    CMP     Component Value Date/Time   NA 144 03/14/2018 0859   K 3.4 (L) 03/14/2018 0859   CL 112 (H) 03/14/2018 0859   CO2 25 03/14/2018 0859   GLUCOSE 155 (H) 03/14/2018 0859   BUN 47 (H) 03/14/2018 0859   CREATININE 1.30 (H) 03/14/2018 0859   CALCIUM 8.3 (L) 03/14/2018 0859   PROT 5.8 (L) 03/13/2018 1031   ALBUMIN 3.0 (L) 03/13/2018 1031   AST 36 03/13/2018 1031   ALT 18 03/13/2018 1031   ALKPHOS 32 (L) 03/13/2018 1031   BILITOT 0.8 03/13/2018 1031   GFRNONAA 48 (L) 03/14/2018 0859   GFRAA 56 (L) 03/14/2018 0859    Assessment: 1.  Anemia: Hemoglobin 8.7 on 03/15/2018, thought related to below 2.  History of gastric ulcer:  Seen at time of recent hospitalization 03/13/2018 thought to be causing above  Plan: 1.  Recheck CBC, CMP and iron studies today 2.  Continue Pantoprazole 40 mg twice daily, this can likely be decreased after repeat EGD 3.  Scheduled patient for repeat surveillance EGD with Dr. Fuller Plan 6 weeks out from his last one with gastric ulcers.  Did discuss risk, benefits, limitations and alternatives and the patient agrees to proceed. 4.  Explained that it could take some time for the patient to regain all of the strength that he previously had before hospitalization 5.  Patient will follow in clinic per recommendations from Dr. Fuller Plan after time of EGD.  Ellouise Newer, PA-C [facility] 03/30/2018, 3:17 PM  Cc: Lawerance Cruel, MD

## 2018-03-30 NOTE — Patient Instructions (Signed)
Your provider has requested that you go to the basement level for lab work before leaving today. Press "B" on the elevator. The lab is located at the first door on the left as you exit the elevator.  We have sent the following medications to your pharmacy for you to pick up at your convenience: Pantoprazole 40 mg twice a day.   You have been scheduled for an endoscopy. Please follow written instructions given to you at your visit today. If you use inhalers (even only as needed), please bring them with you on the day of your procedure. Your physician has requested that you go to www.startemmi.com and enter the access code given to you at your visit today. This web site gives a general overview about your procedure. However, you should still follow specific instructions given to you by our office regarding your preparation for the procedure.

## 2018-03-31 ENCOUNTER — Other Ambulatory Visit: Payer: Self-pay

## 2018-03-31 DIAGNOSIS — D62 Acute posthemorrhagic anemia: Secondary | ICD-10-CM

## 2018-03-31 DIAGNOSIS — K921 Melena: Secondary | ICD-10-CM

## 2018-04-01 DIAGNOSIS — L905 Scar conditions and fibrosis of skin: Secondary | ICD-10-CM | POA: Diagnosis not present

## 2018-04-01 DIAGNOSIS — Z85828 Personal history of other malignant neoplasm of skin: Secondary | ICD-10-CM | POA: Diagnosis not present

## 2018-04-01 DIAGNOSIS — L570 Actinic keratosis: Secondary | ICD-10-CM | POA: Diagnosis not present

## 2018-04-02 ENCOUNTER — Ambulatory Visit (HOSPITAL_COMMUNITY)
Admission: RE | Admit: 2018-04-02 | Discharge: 2018-04-02 | Disposition: A | Discharge status: Home / Self Care | Payer: MEDICARE | Source: Ambulatory Visit | Attending: Physician Assistant | Admitting: Physician Assistant

## 2018-04-02 DIAGNOSIS — K921 Melena: Secondary | ICD-10-CM | POA: Diagnosis not present

## 2018-04-02 DIAGNOSIS — D62 Acute posthemorrhagic anemia: Secondary | ICD-10-CM | POA: Diagnosis not present

## 2018-04-02 MED ORDER — SODIUM CHLORIDE 0.9 % IV SOLN
510.0000 mg | INTRAVENOUS | Status: DC
  Administered 2018-04-02: 510 mg via INTRAVENOUS
  Filled 2018-04-02: qty 17

## 2018-04-02 MED ORDER — SODIUM CHLORIDE 0.9 % IV SOLN
INTRAVENOUS | Status: DC
  Administered 2018-04-02: 10:00:00 via INTRAVENOUS

## 2018-04-02 NOTE — Discharge Instructions (Signed)

## 2018-04-02 NOTE — Progress Notes (Signed)
            [facility]   Diagnosis: Iron Deficiency  Provider:: Fabio Asa  Procedure: Madilyn Hook IV infusion  Note: Mr. Fotheringham came to the [facility] for an iron infusion via PIV. Patient tolerated well. Vitals stable, no complaints. Discharge instructions reviewed., understanding verbalized. Upon discharge patient alert, orientated and ambulatory.      Otho Bellows, RN

## 2018-04-09 ENCOUNTER — Ambulatory Visit (HOSPITAL_COMMUNITY)
Admission: RE | Admit: 2018-04-09 | Discharge: 2018-04-09 | Disposition: A | Discharge status: Home / Self Care | Payer: MEDICARE | Source: Ambulatory Visit | Attending: Physician Assistant | Admitting: Physician Assistant

## 2018-04-09 DIAGNOSIS — D62 Acute posthemorrhagic anemia: Secondary | ICD-10-CM | POA: Diagnosis not present

## 2018-04-09 DIAGNOSIS — K921 Melena: Secondary | ICD-10-CM | POA: Diagnosis not present

## 2018-04-09 DIAGNOSIS — N183 Chronic kidney disease, stage 3 (moderate): Secondary | ICD-10-CM | POA: Diagnosis not present

## 2018-04-09 DIAGNOSIS — I129 Hypertensive chronic kidney disease with stage 1 through stage 4 chronic kidney disease, or unspecified chronic kidney disease: Secondary | ICD-10-CM | POA: Diagnosis not present

## 2018-04-09 MED ORDER — SODIUM CHLORIDE 0.9 % IV SOLN
510.0000 mg | Freq: Once | INTRAVENOUS | Status: AC
  Administered 2018-04-09: 510 mg via INTRAVENOUS
  Filled 2018-04-09: qty 17

## 2018-04-09 MED ORDER — SODIUM CHLORIDE 0.9 % IV SOLN
INTRAVENOUS | Status: DC
  Administered 2018-04-09: 11:00:00 via INTRAVENOUS

## 2018-04-09 NOTE — Progress Notes (Signed)
Patient received Feraheme via PIV. Observed for at least 30 minutes post infusion.Tolerated well, pre infusion BP was 150/67 and post infusion was 156/65. Patient admitted having an appointment with his doctor today and BP is usually addressed during the appointment. Discharge instructions given, verbalized understanding. Patient alert, oriented and ambulatory at the time of discharge.

## 2018-04-09 NOTE — Discharge Instructions (Signed)

## 2018-04-14 ENCOUNTER — Inpatient Hospital Stay (HOSPITAL_COMMUNITY): Admission: RE | Admit: 2018-04-14 | Payer: Self-pay | Source: Ambulatory Visit

## 2018-04-23 ENCOUNTER — Inpatient Hospital Stay (HOSPITAL_COMMUNITY): Admission: RE | Admit: 2018-04-23 | Payer: MEDICARE | Source: Ambulatory Visit

## 2018-04-30 ENCOUNTER — Ambulatory Visit (HOSPITAL_COMMUNITY)
Admission: RE | Admit: 2018-04-30 | Discharge: 2018-04-30 | Disposition: A | Discharge status: Home / Self Care | Payer: MEDICARE | Source: Ambulatory Visit | Attending: Cardiology | Admitting: Cardiology

## 2018-04-30 DIAGNOSIS — Z9889 Other specified postprocedural states: Secondary | ICD-10-CM

## 2018-04-30 DIAGNOSIS — I714 Abdominal aortic aneurysm, without rupture, unspecified: Secondary | ICD-10-CM

## 2018-05-04 DIAGNOSIS — Z85828 Personal history of other malignant neoplasm of skin: Secondary | ICD-10-CM | POA: Diagnosis not present

## 2018-05-04 DIAGNOSIS — L819 Disorder of pigmentation, unspecified: Secondary | ICD-10-CM | POA: Diagnosis not present

## 2018-05-04 DIAGNOSIS — L570 Actinic keratosis: Secondary | ICD-10-CM | POA: Diagnosis not present

## 2018-05-18 ENCOUNTER — Encounter: Payer: Self-pay | Admitting: Gastroenterology

## 2018-05-27 ENCOUNTER — Encounter: Payer: Self-pay | Admitting: Physician Assistant

## 2018-05-27 ENCOUNTER — Other Ambulatory Visit (INDEPENDENT_AMBULATORY_CARE_PROVIDER_SITE_OTHER): Payer: MEDICARE

## 2018-05-27 ENCOUNTER — Telehealth: Payer: Self-pay | Admitting: Physician Assistant

## 2018-05-27 ENCOUNTER — Ambulatory Visit (INDEPENDENT_AMBULATORY_CARE_PROVIDER_SITE_OTHER): Payer: MEDICARE | Admitting: Physician Assistant

## 2018-05-27 VITALS — BP 136/70 | HR 80 | Ht 69.0 in | Wt 130.0 lb

## 2018-05-27 DIAGNOSIS — D509 Iron deficiency anemia, unspecified: Secondary | ICD-10-CM | POA: Diagnosis not present

## 2018-05-27 DIAGNOSIS — R197 Diarrhea, unspecified: Secondary | ICD-10-CM

## 2018-05-27 LAB — COMPREHENSIVE METABOLIC PANEL
ALBUMIN: 4 g/dL (ref 3.5–5.2)
ALK PHOS: 43 U/L (ref 39–117)
ALT: 19 U/L (ref 0–53)
AST: 38 U/L — ABNORMAL HIGH (ref 0–37)
BUN: 36 mg/dL — ABNORMAL HIGH (ref 6–23)
CALCIUM: 9.4 mg/dL (ref 8.4–10.5)
CO2: 27 mEq/L (ref 19–32)
Chloride: 106 mEq/L (ref 96–112)
Creatinine, Ser: 1.54 mg/dL — ABNORMAL HIGH (ref 0.40–1.50)
GFR: 45.77 mL/min — AB (ref >60.00)
Glucose, Bld: 111 mg/dL — ABNORMAL HIGH (ref 70–99)
Potassium: 3.6 mEq/L (ref 3.5–5.1)
Sodium: 143 mEq/L (ref 135–145)
TOTAL PROTEIN: 7.1 g/dL (ref 6.0–8.3)
Total Bilirubin: 0.6 mg/dL (ref 0.2–1.2)

## 2018-05-27 LAB — CBC WITH DIFFERENTIAL/PLATELET
Basophils Absolute: 0 10*3/uL (ref 0.0–0.1)
Basophils Relative: 1.1 % (ref 0.0–3.0)
EOS PCT: 0.8 % (ref 0.0–5.0)
Eosinophils Absolute: 0 10*3/uL (ref 0.0–0.7)
HCT: 37.9 % — ABNORMAL LOW (ref 39.0–52.0)
Hemoglobin: 12.6 g/dL — ABNORMAL LOW (ref 13.0–17.0)
Lymphocytes Relative: 34.1 % (ref 12.0–46.0)
Lymphs Abs: 1.6 10*3/uL (ref 0.7–4.0)
MCHC: 33.1 g/dL (ref 30.0–36.0)
MCV: 84.4 fl (ref 78.0–100.0)
MONOS PCT: 15.8 % — AB (ref 3.0–12.0)
Monocytes Absolute: 0.7 10*3/uL (ref 0.1–1.0)
Neutro Abs: 2.2 10*3/uL (ref 1.4–7.7)
Neutrophils Relative %: 48.2 % (ref 43.0–77.0)
Platelets: 148 10*3/uL — ABNORMAL LOW (ref 150.0–400.0)
RBC: 4.49 Mil/uL (ref 4.22–5.81)
RDW: 19.1 % — ABNORMAL HIGH (ref 11.5–15.5)
WBC: 4.6 10*3/uL (ref 4.0–10.5)

## 2018-05-27 LAB — FERRITIN: FERRITIN: 285.1 ng/mL (ref 22.0–322.0)

## 2018-05-27 LAB — IBC PANEL
IRON: 50 ug/dL (ref 42–165)
SATURATION RATIOS: 13.3 % — AB (ref 20.0–50.0)
TRANSFERRIN: 268 mg/dL (ref 212.0–360.0)

## 2018-05-27 MED ORDER — LOPERAMIDE HCL 2 MG PO CAPS: 2.0000 mg | ORAL_CAPSULE | ORAL | 1 refills | Status: DC | PRN

## 2018-05-27 NOTE — Progress Notes (Signed)
Chief Complaint: Diarrhea  HPI:    Chris Peterson is an 84 year old male with a past medical history as listed below, assigned to Dr. Fuller Plan, who presents to clinic today for complaint of diarrhea.    03/30/2018 patient was seen in clinic for follow-up after hospitalization for acute blood loss anemia related to a gastric ulcer.  Patient had an EGD with Dr. Fuller Plan 03/13/2018 with a nonbleeding gastric ulcer with visible vessel.  At that time patient was doing well other than a general lack of energy and fatigue.  He was scheduled for a surveillance EGD with Dr. Fuller Plan.  CBC, CMP and iron studies were talked that day. Continued on Pantoprazole 40 mg twice daily.    03/30/18 labs showed a hemoglobin of 8.9 (8.7 03/15/2018).  CMP showed a worsening creatinine of 1.65 (1.3 03/15/2018).  Iron and ferritin are still low.  Patient was scheduled for Feraheme IV 510 mg x 2 (3 to 8 days apart).  His recommend he see his PCP in regards to renal function.  He was scheduled for [facility] 8/15 and 8/22.    Today, patient explains he has had liquid diarrhea for the past 3 weeks, interestingly this is ever since he got his Feraheme infusions.  Describes that he can have up to 8-10 loose stools per day and this will also wake him up at night about 3-4 times.  Associated symptoms include fatigue.  Has been trying to sip some Pedialyte which he thinks maybe helps a little bit but he tries to avoid drinking large quantities of this as he is borderline diabetic and has had problems with it in the past.  Denies any abdominal pain, nausea or vomiting.    Tells me he did follow with his kidney specialist who is observing him and apparently has a follow-up appointment later this month.    Social history is positive for his wife having just been diagnosed with colon cancer and undergoing surgery about 2 weeks ago.  This has been somewhat stressful for him.    Denies fever, chills, weight loss, anorexia or blood in his stool.  Past Medical  History:  Diagnosis Date  . AAA (abdominal aortic aneurysm) (HCC)    Infrarenal AAA - maximal diameter of 3.5 cm   . Atherosclerotic renal artery stenosis, bilateral (HCC)    Bilateral renal angiography 11/27/11, Right 70% prox, Left 90% prox s/p LRA PTA/Stenting - 6.0x55m ;  12/12/2011 Renal Duplex: Patent LRA stent & > 60% RRA ost stenosis  . Blood transfusion    S/P "lost blood due to hemorrhoids"  . Dysrhythmia    "fast one time"  . Hemorrhoids   . High cholesterol   . HTN (hypertension)   . Kidney disease, chronic, stage III (moderate, EGFR 30-59 ml/min) (HCC)   . Pericardial effusion    a. incidentally noted on renal u/s 11/2011;  b.  12/16/2011 Echo:  EF 55-60%, Gr1 DD, Mild MR, PASP 32, Mod circumferential peric effusion w/o hemodynamic compromise. Density in pericardial space - consider MRI  . Type II diabetes mellitus ([place]     Past Surgical History:  Procedure Laterality Date  . ABDOMINAL ANGIOGRAM N/A 11/27/2011   Procedure: ABDOMINAL ANGIOGRAM;  Surgeon: MSherren Mocha MD;  [facility];  Service: Cardiovascular;  Laterality: N/A;  . BIOPSY  03/13/2018   Procedure: BIOPSY;  Surgeon: SLadene Artist MD;  Location: [facility]OSCOPY;  Service: Endoscopy;;  . CATARACT EXTRACTION W/ INTRAOCULAR LENS  IMPLANT, BILATERAL  1990's  .  ESOPHAGOGASTRODUODENOSCOPY N/A 03/13/2018   Procedure: ESOPHAGOGASTRODUODENOSCOPY (EGD);  Surgeon: Ladene Artist, MD;  Location: [facility];  Service: Endoscopy;  Laterality: N/A;  . HOT HEMOSTASIS N/A 03/13/2018   Procedure: HOT HEMOSTASIS (ARGON PLASMA COAGULATION/BICAP);  Surgeon: Ladene Artist, MD;  Location: [facility];  Service: Endoscopy;  Laterality: N/A;  . NASAL SEPTUM SURGERY  1980's  . RENAL ANGIOGRAM Bilateral 11/27/2011   Procedure: RENAL ANGIOGRAM;  Surgeon: Sherren Mocha, MD;  Location: [facility] CATH LAB;  Service: Cardiovascular;  Laterality: Bilateral;  possible PTA/stent  . RENAL ARTERY STENT  11/27/11    Current Outpatient  Medications  Medication Sig Dispense Refill  . amLODipine (NORVASC) 5 MG tablet Take 5 mg by mouth daily.    . carvedilol (COREG) 12.5 MG tablet Take 12.5 mg by mouth 2 (two) times daily with a meal.     . Cholecalciferol (VITAMIN D3) 2000 UNITS TABS Take 1,000 mg by mouth daily.     . fenofibrate 160 MG tablet Take 160 mg by mouth daily.    . furosemide (LASIX) 20 MG tablet Take 20 mg by mouth daily.    . LUTEIN-ZEAXANTHIN PO Take by mouth.    . pantoprazole (PROTONIX) 40 MG tablet Take 1 tablet (40 mg total) by mouth 2 (two) times daily. 180 tablet 3  . rosuvastatin (CRESTOR) 40 MG tablet Take 40 mg by mouth daily.    . tamsulosin (FLOMAX) 0.4 MG CAPS capsule Take 0.4 mg by mouth daily after breakfast.      No current facility-administered medications for this visit.     Allergies as of 05/27/2018  . (No Known Allergies)    Family History  Problem Relation Age of Onset  . Hypertension Mother   . Hypertension Brother     Social History   Socioeconomic History  . Marital status: Married    Spouse name: Not on file  . Number of children: Not on file  . Years of education: Not on file  . Highest education level: Not on file  Occupational History  . Not on file  Social Needs  . Financial resource strain: Not on file  . Food insecurity:    Worry: Not on file    Inability: Not on file  . Transportation needs:    Medical: Not on file    Non-medical: Not on file  Tobacco Use  . Smoking status: Current Every Day Smoker    Years: 65.00    Types: Pipe, Cigarettes    Last attempt to quit: 08/19/1996    Years since quitting: 21.7  . Smokeless tobacco: Never Used  . Tobacco comment: 11/27/11 "smoked pipe since 1998; quit cigarettes 1998"  Substance and Sexual Activity  . Alcohol use: Yes    Comment: 03/13/18 2-3 Oz daily   . Drug use: No  . Sexual activity: Never  Lifestyle  . Physical activity:    Days per week: Not on file    Minutes per session: Not on file  . Stress:  Not on file  Relationships  . Social connections:    Talks on phone: Not on file    Gets together: Not on file    Attends religious service: Not on file    Active member of club or organization: Not on file    Attends meetings of clubs or organizations: Not on file    Relationship status: Not on file  . Intimate partner violence:    Fear of current or ex partner: Not on file  Emotionally abused: Not on file    Physically abused: Not on file    Forced sexual activity: Not on file  Other Topics Concern  . Not on file  Social History Narrative  . Not on file    Review of Systems:    Constitutional: No weight loss, fever or chills Cardiovascular: No chest pain   Respiratory: No SOB  Gastrointestinal: See HPI and otherwise negative   Physical Exam:  Vital signs: BP 136/70   Pulse 80   Ht '5\' 9"'$  (1.753 m)   Wt 130 lb (59 kg)   BMI 19.20 kg/m   Constitutional:   Pleasant elderly Caucasian male appears to be in NAD, Well developed, Well nourished, alert and cooperative Respiratory: Respirations even and unlabored. Lungs clear to auscultation bilaterally.   No wheezes, crackles, or rhonchi.  Cardiovascular: Normal S1, S2. No MRG. Regular rate and rhythm. No peripheral edema, cyanosis or pallor.  Gastrointestinal:  Soft, nondistended, nontender. No rebound or guarding. Normal bowel sounds. No appreciable masses or hepatomegaly. Rectal:  Not performed.  Psychiatric: Demonstrates good judgement and reason without abnormal affect or behaviors.  MOST RECENT LABS AND IMAGING: CBC    Component Value Date/Time   WBC 4.3 03/30/2018 1559   RBC 3.21 (L) 03/30/2018 1559   HGB 8.9 (L) 03/30/2018 1559   HCT 26.9 (L) 03/30/2018 1559   PLT 157.0 03/30/2018 1559   MCV 83.9 03/30/2018 1559   MCH 28.9 03/15/2018 0842   MCHC 33.2 03/30/2018 1559   RDW 18.2 (H) 03/30/2018 1559   LYMPHSABS 1.5 03/30/2018 1559   MONOABS 0.6 03/30/2018 1559   EOSABS 0.1 03/30/2018 1559   BASOSABS 0.0  03/30/2018 1559    CMP     Component Value Date/Time   NA 142 03/30/2018 1559   K 3.8 03/30/2018 1559   CL 107 03/30/2018 1559   CO2 28 03/30/2018 1559   GLUCOSE 129 (H) 03/30/2018 1559   BUN 34 (H) 03/30/2018 1559   CREATININE 1.65 (H) 03/30/2018 1559   CALCIUM 8.9 03/30/2018 1559   PROT 6.5 03/30/2018 1559   ALBUMIN 3.6 03/30/2018 1559   AST 39 (H) 03/30/2018 1559   ALT 15 03/30/2018 1559   ALKPHOS 37 (L) 03/30/2018 1559   BILITOT 0.6 03/30/2018 1559   GFRNONAA 48 (L) 03/14/2018 0859   GFRAA 56 (L) 03/14/2018 0859    Assessment: 1.  Diarrhea: Ever since Feraheme infusions, consider reaction from this versus infectious cause versus other 2.  Iron deficiency anemia: Patient recently received Feraheme infusions x2, iron has not been rechecked  Plan: 1.  Ordered labs to include a CBC, CMP, iron studies and ferritin 2.  Ordered stool studies including GI pathogen panel, C. difficile and O&P 3.  Prescribed Loperamide 2 mg every 6 hours #120 with a refill 4.  Discussed with patient that he should continue to stay hydrated with water and/or G2 or Pedialyte 5.  Patient to follow in clinic in 4 to 6 weeks with me or sooner per recommendations from labs above.  Chris Newer, PA-C [facility] 05/27/2018, 2:12 PM  Cc: Lawerance Cruel, MD

## 2018-05-27 NOTE — Patient Instructions (Addendum)
Your provider has requested that you go to the basement level for lab work before leaving today. Press "B" on the elevator. The lab is located at the first door on the left as you exit the elevator.  We have sent the following medications to your pharmacy for you to pick up at your convenience: Loperamide 2 mg every 6 hours as needed.

## 2018-05-27 NOTE — Progress Notes (Signed)
Reviewed and agree with management plan. Highly unlikely his diarrhea is related to Feraheme infusions.  Await blood work and stool studies.   Pricilla Riffle. Fuller Plan, MD [facility]

## 2018-05-28 ENCOUNTER — Other Ambulatory Visit: Payer: MEDICARE

## 2018-05-28 DIAGNOSIS — R197 Diarrhea, unspecified: Secondary | ICD-10-CM | POA: Diagnosis not present

## 2018-05-28 DIAGNOSIS — D509 Iron deficiency anemia, unspecified: Secondary | ICD-10-CM

## 2018-05-28 LAB — C.DIFFICILE TOXIN: C. DIFFICILE TOXIN A: NOT DETECTED

## 2018-05-28 NOTE — Telephone Encounter (Signed)
Spoke to pharmacy and clarified Rx.

## 2018-06-03 ENCOUNTER — Other Ambulatory Visit: Payer: Self-pay

## 2018-06-03 ENCOUNTER — Ambulatory Visit (HOSPITAL_COMMUNITY): Payer: MEDICARE | Attending: Cardiology

## 2018-06-03 ENCOUNTER — Ambulatory Visit (INDEPENDENT_AMBULATORY_CARE_PROVIDER_SITE_OTHER): Payer: MEDICARE | Admitting: Cardiovascular Disease

## 2018-06-03 ENCOUNTER — Ambulatory Visit: Payer: Self-pay | Admitting: Cardiovascular Disease

## 2018-06-03 ENCOUNTER — Encounter: Payer: Self-pay | Admitting: Cardiovascular Disease

## 2018-06-03 VITALS — BP 146/64 | HR 65 | Ht 69.0 in | Wt 132.0 lb

## 2018-06-03 DIAGNOSIS — I714 Abdominal aortic aneurysm, without rupture, unspecified: Secondary | ICD-10-CM

## 2018-06-03 DIAGNOSIS — I313 Pericardial effusion (noninflammatory): Secondary | ICD-10-CM | POA: Diagnosis not present

## 2018-06-03 DIAGNOSIS — I3139 Other pericardial effusion (noninflammatory): Secondary | ICD-10-CM

## 2018-06-03 DIAGNOSIS — I701 Atherosclerosis of renal artery: Secondary | ICD-10-CM | POA: Diagnosis not present

## 2018-06-03 NOTE — Patient Instructions (Signed)
Medication Instructions:  Your provider recommends that you continue on your current medications as directed. Please refer to the Current Medication list given to you today.    Labwork: None  Testing/Procedures: Your provider has requested that you have an echocardiogram in 1 year. Echocardiography is a painless test that uses sound waves to create images of your heart. It provides your doctor with information about the size and shape of your heart and how well your heart's chambers and valves are working. This procedure takes approximately one hour. There are no restrictions for this procedure.  You will have a renal ultrasound in 1 year.  Follow-Up: Your provider wants you to follow-up in: 1 year with Dr. Burt Knack. You will receive a reminder letter in the mail two months in advance. If you don't receive a letter, please call our office to schedule the follow-up appointment.    Any Other Special Instructions Will Be Listed Below (If Applicable).     If you need a refill on your cardiac medications before your next appointment, please call your pharmacy.

## 2018-06-03 NOTE — Progress Notes (Signed)
Cardiology Office Note:    Date:  06/05/2018   ID:  Chris Peterson, DOB 1930/05/09, MRN [id number]  PCP:  Lawerance Cruel, MD  Cardiologist:  Sherren Mocha, MD  Electrophysiologist:  None   Referring MD: Lawerance Cruel, MD   Chief Complaint  Patient presents with  . Cough    History of Present Illness:    Chris Peterson is a 84 y.o. male with a hx of renal artery stenosis and idiopathic pericardial effusion. The patient underwent left renal artery stenting and has had continued patency of that stent. He has been noted to have severe right renal artery stenosis. This is been treated medically in the setting of well controlled blood pressure and stable renal function. He also was incidentally noted to have a pericardial effusion and this has been followed by serial echocardiography with no change over time and no evidence of hemodynamic sequelae.  The patient is here alone today. He states that he's had 5 urinary tract infections and has required hospitalization for this. He's also had internal bleeding and required endoscopy, ultimately diagnosed with a gastric ulcer. States that his most recent blood count was 12.5 mg/dL.  The patient reports shortness of breath with activity.  This is unchanged over time.  He denies orthopnea or PND.  He denies leg swelling.  He denies chest pain or pressure.  He continues to smoke cigarettes.  Past Medical History:  Diagnosis Date  . AAA (abdominal aortic aneurysm) (HCC)    Infrarenal AAA - maximal diameter of 3.5 cm   . Atherosclerotic renal artery stenosis, bilateral (HCC)    Bilateral renal angiography 11/27/11, Right 70% prox, Left 90% prox s/p LRA PTA/Stenting - 6.0x47m ;  12/12/2011 Renal Duplex: Patent LRA stent & > 60% RRA ost stenosis  . Blood transfusion    S/P "lost blood due to hemorrhoids"  . Dysrhythmia    "fast one time"  . Hemorrhoids   . High cholesterol   . HTN (hypertension)   . Kidney disease, chronic, stage III  (moderate, EGFR 30-59 ml/min) (HCC)   . Pericardial effusion    a. incidentally noted on renal u/s 11/2011;  b.  12/16/2011 Echo:  EF 55-60%, Gr1 DD, Mild MR, PASP 32, Mod circumferential peric effusion w/o hemodynamic compromise. Density in pericardial space - consider MRI  . Type II diabetes mellitus ([place]     Past Surgical History:  Procedure Laterality Date  . ABDOMINAL ANGIOGRAM N/A 11/27/2011   Procedure: ABDOMINAL ANGIOGRAM;  Surgeon: MSherren Mocha MD;  [facility];  Service: Cardiovascular;  Laterality: N/A;  . BIOPSY  03/13/2018   Procedure: BIOPSY;  Surgeon: SLadene Artist MD;  Location: [facility];  Service: Endoscopy;;  . CATARACT EXTRACTION W/ INTRAOCULAR LENS  IMPLANT, BILATERAL  1990's  . ESOPHAGOGASTRODUODENOSCOPY N/A 03/13/2018   Procedure: ESOPHAGOGASTRODUODENOSCOPY (EGD);  Surgeon: SLadene Artist MD;  [facility];  Service: Endoscopy;  Laterality: N/A;  . HOT HEMOSTASIS N/A 03/13/2018   Procedure: HOT HEMOSTASIS (ARGON PLASMA COAGULATION/BICAP);  Surgeon: SLadene Artist MD;  [facility];  Service: Endoscopy;  Laterality: N/A;  . NASAL SEPTUM SURGERY  1980's  . RENAL ANGIOGRAM Bilateral 11/27/2011   Procedure: RENAL ANGIOGRAM;  Surgeon: MSherren Mocha MD;  [facility];  Service: Cardiovascular;  Laterality: Bilateral;  possible PTA/stent  . RENAL ARTERY STENT  11/27/11    Current Medications: Current Meds  Medication Sig  . amLODipine (NORVASC) 5 MG tablet Take 5 mg by  mouth daily.  . carvedilol (COREG) 12.5 MG tablet Take 12.5 mg by mouth 2 (two) times daily with a meal.   . cholecalciferol (VITAMIN D) 1000 units tablet Take 1,000 mg by mouth daily.   . fenofibrate 160 MG tablet Take 160 mg by mouth daily.  . furosemide (LASIX) 20 MG tablet Take 20 mg by mouth daily.  [name] loperamide (IMODIUM) 2 MG capsule Take 1 capsule (2 mg total) by mouth as needed for diarrhea or loose stools.  . LUTEIN-ZEAXANTHIN PO Take by mouth.    . pantoprazole (PROTONIX) 40 MG tablet Take 1 tablet (40 mg total) by mouth 2 (two) times daily.  . rosuvastatin (CRESTOR) 40 MG tablet Take 40 mg by mouth daily.  . tamsulosin (FLOMAX) 0.4 MG CAPS capsule Take 0.4 mg by mouth daily after breakfast.      Allergies:   Patient has no known allergies.   Social History   Socioeconomic History  . Marital status: Married    Spouse name: Not on file  . Number of children: Not on file  . Years of education: Not on file  . Highest education level: Not on file  Occupational History  . Not on file  Social Needs  . Financial resource strain: Not on file  . Food insecurity:    Worry: Not on file    Inability: Not on file  . Transportation needs:    Medical: Not on file    Non-medical: Not on file  Tobacco Use  . Smoking status: Former Smoker    Years: 65.00    Types: Pipe, Cigarettes    Last attempt to quit: 08/19/1996    Years since quitting: 21.8  . Smokeless tobacco: Never Used  . Tobacco comment: 11/27/11 "smoked pipe since 1998; quit cigarettes 1998"  Substance and Sexual Activity  . Alcohol use: Yes    Comment: 03/13/18 2-3 Oz daily   . Drug use: No  . Sexual activity: Never  Lifestyle  . Physical activity:    Days per week: Not on file    Minutes per session: Not on file  . Stress: Not on file  Relationships  . Social connections:    Talks on phone: Not on file    Gets together: Not on file    Attends religious service: Not on file    Active member of club or organization: Not on file    Attends meetings of clubs or organizations: Not on file    Relationship status: Not on file  Other Topics Concern  . Not on file  Social History Narrative  . Not on file     Family History: The patient's family history includes Hypertension in his brother and mother.  ROS:   Please see the history of present illness.    All other systems reviewed and are negative.  EKGs/Labs/Other Studies Reviewed:    The following studies  were reviewed today: Echo (today): Study Conclusions  - Left ventricle: The cavity size was normal. Wall thickness was   increased in a pattern of mild LVH. Systolic function was normal.   The estimated ejection fraction was in the range of 60% to 65%.   Wall motion was normal; there were no regional wall motion   abnormalities. Doppler parameters are consistent with abnormal   left ventricular relaxation (grade 1 diastolic dysfunction). - Aortic valve: Trileaflet; moderately calcified leaflets.   Sclerosis without stenosis. - Aorta: Mildly dilated aortic root. Aortic root dimension: 38 mm   (ED). -  Mitral valve: Moderately calcified annulus. There was trivial   regurgitation. - Left atrium: The atrium was mildly dilated. - Right ventricle: The cavity size was normal. Systolic function   was mildly reduced. - Tricuspid valve: Peak RV-RA gradient (S): 23 mm Hg. - Pulmonary arteries: PA peak pressure: 26 mm Hg (S). - Inferior vena cava: The vessel was normal in size. The   respirophasic diameter changes were in the normal range (= 50%),   consistent with normal central venous pressure. - Pericardium, extracardiac: Moderate pericardial effusion with no   evidence for tamponade.  Impressions:  - Normal LV size with mild LV hypertrophy. EF 60-65%. Normal RV   size with mildly decreased systolic function. Aortic sclerosis   without significant stenosis. Persistent moderate pericardial   effusion with no tamponade.   EKG:  EKG is not ordered today.    Recent Labs: 05/27/2018: ALT 19; BUN 36; Creatinine, Ser 1.54; Hemoglobin 12.6; Platelets 148.0; Potassium 3.6; Sodium 143  Recent Lipid Panel No results found for: CHOL, TRIG, HDL, CHOLHDL, VLDL, LDLCALC, LDLDIRECT  Physical Exam:    VS:  BP (!) 146/64   Pulse 65   Ht 5' 9" (1.753 m)   Wt 132 lb (59.9 kg)   SpO2 95%   BMI 19.49 kg/m     Wt Readings from Last 3 Encounters:  06/03/18 132 lb (59.9 kg)  05/27/18 130 lb (59  kg)  03/30/18 135 lb 12.8 oz (61.6 kg)     GEN: Elderly male in no acute distress HEENT: Normal NECK: No JVD; No carotid bruits LYMPHATICS: No lymphadenopathy CARDIAC: RRR, 2/6 systolic murmur at the RUSB RESPIRATORY:  Clear to auscultation without rales, wheezing or rhonchi  ABDOMEN: Soft, non-tender, non-distended MUSCULOSKELETAL:  No edema; No deformity  SKIN: Warm and dry NEUROLOGIC:  Alert and oriented x 3 PSYCHIATRIC:  Normal affect   ASSESSMENT:    1. Pericardial effusion   2. Abdominal aortic aneurysm (AAA) without rupture ([place])   3. Renal artery stenosis (HCC)    PLAN:    In order of problems listed above:  1. The patient's echo images are personally reviewed and his pericardial effusion is unchanged in size.  There are no echo or clinical features of cardiac tamponade on present.  He will continue with routine clinical follow-up and echo surveillance. 2. The patient's abdominal aortic aneurysm is increased from 3.2 cm in 2017 to 3.4 cm in 2018 to 4.0 cm in 2019.  He is counseled regarding tight blood pressure control and tobacco cessation.  He will have a follow-up duplex scan next year.  We reviewed potential symptoms of abdominal aortic aneurysm today.  He understands that if his aneurysm continues to increase in size, he will be referred to vascular surgery for consideration of repair. 3. The patient's most recent renal artery duplex is reviewed, demonstrating continued patency of his left renal artery stent and greater than 60% stenosis of the right renal artery.  His kidney function has remained stable over time.   Medication Adjustments/Labs and Tests Ordered: Current medicines are reviewed at length with the patient today.  Concerns regarding medicines are outlined above.  Orders Placed This Encounter  Procedures  . ECHOCARDIOGRAM COMPLETE   No orders of the defined types were placed in this encounter.   Patient Instructions  Medication Instructions:  Your  provider recommends that you continue on your current medications as directed. Please refer to the Current Medication list given to you today.    Labwork: None  Testing/Procedures: Your provider has requested that you have an echocardiogram in 1 year. Echocardiography is a painless test that uses sound waves to create images of your heart. It provides your doctor with information about the size and shape of your heart and how well your heart's chambers and valves are working. This procedure takes approximately one hour. There are no restrictions for this procedure.  You will have a renal ultrasound in 1 year.  Follow-Up: Your provider wants you to follow-up in: 1 year with Dr. Burt Knack. You will receive a reminder letter in the mail two months in advance. If you don't receive a letter, please call our office to schedule the follow-up appointment.    Any Other Special Instructions Will Be Listed Below (If Applicable).     If you need a refill on your cardiac medications before your next appointment, please call your pharmacy.      Signed, Sherren Mocha, MD  06/05/2018 6:09 PM    [place]

## 2018-06-04 LAB — OVA AND PARASITE EXAMINATION
CONCENTRATE RESULT:: NONE SEEN
MICRO NUMBER:: 91220259
SPECIMEN QUALITY: ADEQUATE
TRICHROME RESULT: NONE SEEN

## 2018-06-04 LAB — GASTROINTESTINAL PATHOGEN PANEL PCR
C. difficile Tox A/B, PCR: NOT DETECTED
CAMPYLOBACTER, PCR: NOT DETECTED
Cryptosporidium, PCR: NOT DETECTED
E COLI 0157, PCR: NOT DETECTED
E coli (ETEC) LT/ST PCR: NOT DETECTED
E coli (STEC) stx1/stx2, PCR: NOT DETECTED
GIARDIA LAMBLIA, PCR: NOT DETECTED
Norovirus, PCR: NOT DETECTED
ROTAVIRUS, PCR: NOT DETECTED
SALMONELLA, PCR: DETECTED — AB
Shigella, PCR: NOT DETECTED

## 2018-06-05 ENCOUNTER — Other Ambulatory Visit: Payer: Self-pay

## 2018-06-05 ENCOUNTER — Encounter: Payer: Self-pay | Admitting: Cardiovascular Disease

## 2018-06-05 MED ORDER — CIPROFLOXACIN HCL 500 MG PO TABS: 500.0000 mg | ORAL_TABLET | Freq: Two times a day (BID) | ORAL | 0 refills | Status: AC

## 2018-06-11 ENCOUNTER — Ambulatory Visit (AMBULATORY_SURGERY_CENTER): Payer: MEDICARE | Admitting: Gastroenterology

## 2018-06-11 ENCOUNTER — Encounter: Payer: Self-pay | Admitting: Gastroenterology

## 2018-06-11 VITALS — BP 140/70 | HR 111 | Temp 97.3°F | Resp 15 | Ht 69.0 in | Wt 135.0 lb

## 2018-06-11 DIAGNOSIS — I10 Essential (primary) hypertension: Secondary | ICD-10-CM | POA: Diagnosis not present

## 2018-06-11 DIAGNOSIS — K259 Gastric ulcer, unspecified as acute or chronic, without hemorrhage or perforation: Secondary | ICD-10-CM | POA: Diagnosis not present

## 2018-06-11 DIAGNOSIS — K250 Acute gastric ulcer with hemorrhage: Secondary | ICD-10-CM

## 2018-06-11 DIAGNOSIS — K3189 Other diseases of stomach and duodenum: Secondary | ICD-10-CM

## 2018-06-11 DIAGNOSIS — K2960 Other gastritis without bleeding: Secondary | ICD-10-CM

## 2018-06-11 DIAGNOSIS — K449 Diaphragmatic hernia without obstruction or gangrene: Secondary | ICD-10-CM

## 2018-06-11 DIAGNOSIS — E119 Type 2 diabetes mellitus without complications: Secondary | ICD-10-CM | POA: Diagnosis not present

## 2018-06-11 MED ORDER — SODIUM CHLORIDE 0.9 % IV SOLN
500.0000 mL | Freq: Once | INTRAVENOUS | Status: DC
Start: 2018-06-11 — End: 2018-09-23

## 2018-06-11 NOTE — Progress Notes (Signed)
Report given to PACU, vss 

## 2018-06-11 NOTE — Op Note (Signed)
[place] Patient Name: Chris Peterson Procedure Date: 06/11/2018 10:10 AM MRN: [id number] Endoscopist: Ladene Artist , MD Age: 84 Referring MD:  Date of Birth: [birth year] Gender: Male Account #: [id number] Procedure:                Upper GI endoscopy Indications:              Follow-up of acute gastric ulcer with hemorrhage Medicines:                Monitored Anesthesia Care Procedure:                Pre-Anesthesia Assessment:                           - Prior to the procedure, a History and Physical                            was performed, and patient medications and                            allergies were reviewed. The patient's tolerance of                            previous anesthesia was also reviewed. The risks                            and benefits of the procedure and the sedation                            options and risks were discussed with the patient.                            All questions were answered, and informed consent                            was obtained. Prior Anticoagulants: The patient has                            taken no previous anticoagulant or antiplatelet                            agents. ASA Grade Assessment: III - A patient with                            severe systemic disease. After reviewing the risks                            and benefits, the patient was deemed in                            satisfactory condition to undergo the procedure.                           After obtaining informed consent, the endoscope was  passed under direct vision. Throughout the                            procedure, the patient's blood pressure, pulse, and                            oxygen saturations were monitored continuously. The                            Model GIF-HQ190 [id number]) scope was introduced                            through the mouth, and advanced to the second part   of duodenum. The upper GI endoscopy was                            accomplished without difficulty. The patient                            tolerated the procedure well. Scope In: Scope Out: Findings:                 The examined esophagus was normal.                           A few localized, small non-bleeding erosions were                            found in the gastric antrum. There were no stigmata                            of recent bleeding. Prior antral ulcer has                            completely healed.                           A small hiatal hernia was present.                           The exam of the stomach was otherwise normal.                           The duodenal bulb and second portion of the                            duodenum were normal. Complications:            No immediate complications. Estimated Blood Loss:     Estimated blood loss: none. Impression:               - Normal esophagus.                           - Non-bleeding erosive gastropathy.                           -  Small hiatal hernia.                           - Normal duodenal bulb and second portion of the                            duodenum.                           - No specimens collected. Recommendation:           - Patient has a contact number available for                            emergencies. The signs and symptoms of potential                            delayed complications were discussed with the                            patient. Return to normal activities tomorrow.                            Written discharge instructions were provided to the                            patient.                           - Resume previous diet.                           - Continue present medications.                           - Reduce pantoprazole to 40 mg po qd for long term                            use.                           - No aspirin, ibuprofen, naproxen, or other                             non-steroidal anti-inflammatory drugs long term.                           - Follow up with PCP. GI follow up as needed. Ladene Artist, MD 06/11/2018 10:24:04 AM This report has been signed electronically.

## 2018-06-11 NOTE — Progress Notes (Signed)
Recent Salmonella infection Pt took his last pill for treatment hasnt had symptoms ie diarrhea since the second day he took "the pill Anderson Malta gave me" Alphonsa Gin supv RN made aware

## 2018-06-11 NOTE — Patient Instructions (Signed)
YOU HAD AN ENDOSCOPIC PROCEDURE TODAY AT [facility]:   Refer to the procedure report that was given to you for any specific questions about what was found during the examination.  If the procedure report does not answer your questions, please call your gastroenterologist to clarify.  If you requested that your care partner not be given the details of your procedure findings, then the procedure report has been included in a sealed envelope for you to review at your convenience later.  YOU SHOULD EXPECT: Some feelings of bloating in the abdomen. Passage of more gas than usual.  Walking can help get rid of the air that was put into your GI tract during the procedure and reduce the bloating.   Please Note:  You might notice some irritation and congestion in your nose or some drainage.  This is from the oxygen used during your procedure.  There is no need for concern and it should clear up in a day or so.  SYMPTOMS TO REPORT IMMEDIATELY:    Following upper endoscopy (EGD)  Vomiting of blood or coffee ground material  New chest pain or pain under the shoulder blades  Painful or persistently difficult swallowing  New shortness of breath  Fever of 100F or higher  Black, tarry-looking stools  For urgent or emergent issues, a gastroenterologist can be reached at any hour by calling [phone].   DIET:  We do recommend a small meal at first, but then you may proceed to your regular diet.  Drink plenty of fluids but you should avoid alcoholic beverages for 24 hours.  ACTIVITY:  You should plan to take it easy for the rest of today and you should NOT DRIVE or use heavy machinery until tomorrow (because of the sedation medicines used during the test).    FOLLOW UP: Our staff will call the number listed on your records the next business day following your procedure to check on you and address any questions or concerns that you may have regarding the information given to you  following your procedure. If we do not reach you, we will leave a message.  However, if you are feeling well and you are not experiencing any problems, there is no need to return our call.  We will assume that you have returned to your regular daily activities without incident.  Reduce your pantoprazole to 40mg   once per day.   Do not use any NSAIDS: aspirin, ibuprofen, not aleve until further notice.   SIGNATURES/CONFIDENTIALITY: You and/or your care partner have signed paperwork which will be entered into your electronic medical record.  These signatures attest to the fact that that the information above on your After Visit Summary has been reviewed and is understood.  Full responsibility of the confidentiality of this discharge information lies with you and/or your care-partner.

## 2018-06-12 ENCOUNTER — Telehealth: Payer: Self-pay

## 2018-06-12 NOTE — Telephone Encounter (Signed)
  Follow up Call-  Call back number 06/11/2018  Post procedure Call Back phone  # [phone]  Permission to leave phone message Yes  Some recent data might be hidden     Patient questions:  Do you have a fever, pain , or abdominal swelling? No. Pain Score  0 *  Have you tolerated food without any problems? Yes.    Have you been able to return to your normal activities? Yes.    Do you have any questions about your discharge instructions: Diet   No. Medications  No. Follow up visit  No.  Do you have questions or concerns about your Care? No.  Actions: * If pain score is 4 or above: No action needed, pain <4.

## 2018-06-15 DIAGNOSIS — I739 Peripheral vascular disease, unspecified: Secondary | ICD-10-CM | POA: Diagnosis not present

## 2018-06-15 DIAGNOSIS — Z0000 Encounter for general adult medical examination without abnormal findings: Secondary | ICD-10-CM | POA: Diagnosis not present

## 2018-06-15 DIAGNOSIS — D631 Anemia in chronic kidney disease: Secondary | ICD-10-CM | POA: Diagnosis not present

## 2018-06-15 DIAGNOSIS — N183 Chronic kidney disease, stage 3 (moderate): Secondary | ICD-10-CM | POA: Diagnosis not present

## 2018-06-15 DIAGNOSIS — Z720 Tobacco use: Secondary | ICD-10-CM | POA: Diagnosis not present

## 2018-06-15 DIAGNOSIS — E785 Hyperlipidemia, unspecified: Secondary | ICD-10-CM | POA: Diagnosis not present

## 2018-06-15 DIAGNOSIS — N400 Enlarged prostate without lower urinary tract symptoms: Secondary | ICD-10-CM | POA: Diagnosis not present

## 2018-06-15 DIAGNOSIS — E1122 Type 2 diabetes mellitus with diabetic chronic kidney disease: Secondary | ICD-10-CM | POA: Diagnosis not present

## 2018-06-15 DIAGNOSIS — N2581 Secondary hyperparathyroidism of renal origin: Secondary | ICD-10-CM | POA: Diagnosis not present

## 2018-06-15 DIAGNOSIS — I129 Hypertensive chronic kidney disease with stage 1 through stage 4 chronic kidney disease, or unspecified chronic kidney disease: Secondary | ICD-10-CM | POA: Diagnosis not present

## 2018-06-15 DIAGNOSIS — I701 Atherosclerosis of renal artery: Secondary | ICD-10-CM | POA: Diagnosis not present

## 2018-07-23 DIAGNOSIS — R531 Weakness: Secondary | ICD-10-CM | POA: Diagnosis not present

## 2018-07-23 DIAGNOSIS — E1169 Type 2 diabetes mellitus with other specified complication: Secondary | ICD-10-CM | POA: Diagnosis not present

## 2018-07-23 DIAGNOSIS — H6120 Impacted cerumen, unspecified ear: Secondary | ICD-10-CM | POA: Diagnosis not present

## 2018-07-23 DIAGNOSIS — R05 Cough: Secondary | ICD-10-CM | POA: Diagnosis not present

## 2018-07-23 DIAGNOSIS — Z23 Encounter for immunization: Secondary | ICD-10-CM | POA: Diagnosis not present

## 2018-08-27 DIAGNOSIS — H6123 Impacted cerumen, bilateral: Secondary | ICD-10-CM | POA: Diagnosis not present

## 2018-09-22 ENCOUNTER — Emergency Department (HOSPITAL_COMMUNITY)
Admission: EM | Admit: 2018-09-22 | Discharge: 2018-09-22 | Disposition: A | Discharge status: Home / Self Care | Payer: MEDICARE | Attending: Emergency Medicine | Admitting: Emergency Medicine

## 2018-09-22 ENCOUNTER — Encounter (HOSPITAL_COMMUNITY): Payer: Self-pay

## 2018-09-22 ENCOUNTER — Other Ambulatory Visit: Payer: Self-pay

## 2018-09-22 DIAGNOSIS — E1122 Type 2 diabetes mellitus with diabetic chronic kidney disease: Secondary | ICD-10-CM | POA: Insufficient documentation

## 2018-09-22 DIAGNOSIS — Z79899 Other long term (current) drug therapy: Secondary | ICD-10-CM | POA: Diagnosis not present

## 2018-09-22 DIAGNOSIS — F17210 Nicotine dependence, cigarettes, uncomplicated: Secondary | ICD-10-CM | POA: Insufficient documentation

## 2018-09-22 DIAGNOSIS — K1121 Acute sialoadenitis: Secondary | ICD-10-CM | POA: Diagnosis not present

## 2018-09-22 DIAGNOSIS — I129 Hypertensive chronic kidney disease with stage 1 through stage 4 chronic kidney disease, or unspecified chronic kidney disease: Secondary | ICD-10-CM | POA: Diagnosis not present

## 2018-09-22 DIAGNOSIS — N183 Chronic kidney disease, stage 3 (moderate): Secondary | ICD-10-CM | POA: Insufficient documentation

## 2018-09-22 DIAGNOSIS — I714 Abdominal aortic aneurysm, without rupture: Secondary | ICD-10-CM | POA: Diagnosis not present

## 2018-09-22 DIAGNOSIS — K1120 Sialoadenitis, unspecified: Secondary | ICD-10-CM | POA: Diagnosis not present

## 2018-09-22 DIAGNOSIS — H9201 Otalgia, right ear: Secondary | ICD-10-CM | POA: Diagnosis present

## 2018-09-22 MED ORDER — IBUPROFEN 200 MG PO TABS
600.0000 mg | ORAL_TABLET | Freq: Once | ORAL | Status: AC
  Administered 2018-09-22: 600 mg via ORAL
  Filled 2018-09-22: qty 3

## 2018-09-22 NOTE — ED Triage Notes (Signed)
Patient reports that he has right ear pain and a sore throat since last night.

## 2018-09-22 NOTE — ED Provider Notes (Signed)
[facility] DEPT Provider Note   CSN: [id number] Arrival date & time: 09/22/18  3500     History   Chief Complaint Chief Complaint  Patient presents with  . Otalgia  . Sore Throat    HPI Chris Peterson is a 85 y.o. male.  HPI A 38-year-old male presents to the emergency department with complaints of right ear pain and right preauricular pain which began last night.  He is tried Tylenol with some improvement.  Family grades there is a small amount of swelling of the right parotid region as compared to the left.  No neck pain.  No difficulty breathing or swallowing.  No focal dental pain.  Symptoms are mild in severity.  No recent illness.  No fevers or chills.  No hearing loss.  No other complaints.   Past Medical History:  Diagnosis Date  . AAA (abdominal aortic aneurysm) (HCC)    Infrarenal AAA - maximal diameter of 3.5 cm   . Atherosclerotic renal artery stenosis, bilateral (HCC)    Bilateral renal angiography 11/27/11, Right 70% prox, Left 90% prox s/p LRA PTA/Stenting - 6.0x91m ;  12/12/2011 Renal Duplex: Patent LRA stent & > 60% RRA ost stenosis  . Blood transfusion    S/P "lost blood due to hemorrhoids"  . Dysrhythmia    "fast one time"  . Hemorrhoids   . High cholesterol   . HTN (hypertension)   . Kidney disease, chronic, stage III (moderate, EGFR 30-59 ml/min) (HCC)   . Pericardial effusion    a. incidentally noted on renal u/s 11/2011;  b.  12/16/2011 Echo:  EF 55-60%, Gr1 DD, Mild MR, PASP 32, Mod circumferential peric effusion w/o hemodynamic compromise. Density in pericardial space - consider MRI  . Type II diabetes mellitus ([facility]     Patient Active Problem List   Diagnosis Date Noted  . GI bleed 03/13/2018  . Anemia associated with acute blood loss 03/13/2018  . Melena   . Acute gastric ulcer with hemorrhage   . Pericardial effusion   . Atherosclerotic [place] artery stenosis, bilateral ([facility]   . HTN (hypertension) 11/28/2011    . AAA [place]) [place] 11/28/2011  . CKD (chronic kidney disease) 11/28/2011  . Diabetes mellitus type II 11/28/2011  . Tobacco abuse 11/28/2011    Past Surgical History:  Procedure Laterality Date  . ABDOMINAL ANGIOGRAM N/A 11/27/2011   Procedure: ABDOMINAL ANGIOGRAM;  Surgeon: MSherren Mocha MD;  [facility];  Service: Cardiovascular;  Laterality: N/A;  . BIOPSY  03/13/2018   Procedure: BIOPSY;  Surgeon: SLadene Artist MD;  Location: [facility];  Service: Endoscopy;;  . CATARACT EXTRACTION W/ INTRAOCULAR LENS  IMPLANT, BILATERAL  1990's  . ESOPHAGOGASTRODUODENOSCOPY N/A 03/13/2018   Procedure: ESOPHAGOGASTRODUODENOSCOPY (EGD);  Surgeon: SLadene Artist MD;  [facility];  Service: Endoscopy;  Laterality: N/A;  . HOT HEMOSTASIS N/A 03/13/2018   Procedure: HOT HEMOSTASIS (ARGON PLASMA COAGULATION/BICAP);  Surgeon: SLadene Artist MD;  [facility];  Service: Endoscopy;  Laterality: N/A;  . NASAL SEPTUM SURGERY  1980's  . RENAL ANGIOGRAM Bilateral 11/27/2011   Procedure: RENAL ANGIOGRAM;  Surgeon: MSherren Mocha MD;  [facility];  Service: Cardiovascular;  Laterality: Bilateral;  possible PTA/stent  . RENAL ARTERY STENT  11/27/11        Home Medications    Prior to Admission medications   Medication Sig Start Date End Date Taking? Authorizing Provider  amLODipine (NORVASC) 5 MG tablet Take 5  mg by mouth daily. 02/20/16  Yes [provider]  carvedilol (COREG) 12.5 MG tablet Take 12.5 mg by mouth 2 (two) times daily with a meal.    Yes [provider]  cholecalciferol (VITAMIN D) 1000 units tablet Take 1,000 mg by mouth daily.    Yes [provider]  famotidine (PEPCID) 40 MG tablet Take 40 mg by mouth daily. 06/22/18  Yes [provider]  fenofibrate 160 MG tablet Take 160 mg by mouth daily. 08/31/15  Yes [provider]  ferrous sulfate 325 (65 FE) MG tablet Take 325 mg by mouth  daily with breakfast.   Yes [provider]  furosemide (LASIX) 20 MG tablet Take 20 mg by mouth daily.   Yes [provider]  loperamide (IMODIUM) 2 MG capsule Take 1 capsule (2 mg total) by mouth as needed for diarrhea or loose stools. 05/27/18  Yes Levin Erp, PA  LUTEIN-ZEAXANTHIN PO Take by mouth.   Yes [provider]  rosuvastatin (CRESTOR) 40 MG tablet Take 40 mg by mouth daily.   Yes [provider]  tamsulosin (FLOMAX) 0.4 MG CAPS capsule Take 0.4 mg by mouth daily after breakfast.  10/28/14  Yes [provider]  pantoprazole (PROTONIX) 40 MG tablet Take 1 tablet (40 mg total) by mouth 2 (two) times daily. Patient not taking: Reported on 09/22/2018 03/30/18   Levin Erp, PA    Family History Family History  Problem Relation Age of Onset  . Hypertension Mother   . Hypertension Brother     Social History Social History   Tobacco Use  . Smoking status: Current Every Day Smoker    Years: 65.00    Types: Pipe, Cigarettes    Last attempt to quit: 08/19/1996    Years since quitting: 22.1  . Smokeless tobacco: Never Used  . Tobacco comment: 11/27/11 "smoked pipe since 1998; quit cigarettes 1998"  Substance Use Topics  . Alcohol use: Yes    Comment: 03/13/18 2-3 Oz daily   . Drug use: No     Allergies   Patient has no known allergies.   Review of Systems Review of Systems  All other systems reviewed and are negative.    Physical Exam Updated Vital Signs BP (!) 153/80 (BP Location: Right Arm)   Pulse 76   Temp 97.6 F (36.4 C) (Oral)   Resp 18   Ht '5\' 9"'$  (1.753 m)   Wt 58.1 kg   SpO2 100%   BMI 18.90 kg/m   Physical Exam Vitals signs and nursing note reviewed.  Constitutional:      Appearance: He is well-developed.  HENT:     Head: Normocephalic.     Comments: Mild tenderness over the right parotid gland without fluctuance.  No erythema or warmth over this region either.  Partial dentures  removed and no focal gingival tenderness or swelling.  Posterior pharynx is normal.  Uvula midline.  Tongue normal in size.  Bilateral TMs are normal    Right Ear: Tympanic membrane normal.     Left Ear: Tympanic membrane normal.  Neck:     Musculoskeletal: Normal range of motion and neck supple. No neck rigidity or muscular tenderness.  Pulmonary:     Effort: Pulmonary effort is normal.  Abdominal:     General: There is no distension.  Musculoskeletal: Normal range of motion.  Lymphadenopathy:     Cervical: No cervical adenopathy.  Neurological:     Mental Status: He is alert and  oriented to person, place, and time.      ED Treatments / Results  Labs (all labs ordered are listed, but only abnormal results are displayed) Labs Reviewed - No data to display  EKG None  Radiology No results found.  Procedures Procedures (including critical care time)  Medications Ordered in ED Medications  ibuprofen (ADVIL,MOTRIN) tablet 600 mg (has no administration in time range)     Initial Impression / Assessment and Plan / ED Course  I have reviewed the triage vital signs and the nursing notes.  Pertinent labs & imaging results that were available during my care of the patient were reviewed by me and considered in my medical decision making (see chart for details).     Suspect developing parotitis of the right parotid gland.  Primary care follow-up.  Recommended warm compresses and anti-inflammatories.  Recommend ibuprofen 400 mg 3 times a day and addition to as needed Tylenol.  Final Clinical Impressions(s) / ED Diagnoses   Final diagnoses:  Parotitis, acute    ED Discharge Orders    None       Jola Schmidt, MD 09/22/18 [phone]

## 2018-09-25 DIAGNOSIS — K1120 Sialoadenitis, unspecified: Secondary | ICD-10-CM | POA: Diagnosis not present

## 2018-11-02 DIAGNOSIS — M26629 Arthralgia of temporomandibular joint, unspecified side: Secondary | ICD-10-CM | POA: Diagnosis not present

## 2019-01-15 DIAGNOSIS — K219 Gastro-esophageal reflux disease without esophagitis: Secondary | ICD-10-CM | POA: Diagnosis not present

## 2019-01-15 DIAGNOSIS — D631 Anemia in chronic kidney disease: Secondary | ICD-10-CM | POA: Diagnosis not present

## 2019-01-15 DIAGNOSIS — N183 Chronic kidney disease, stage 3 (moderate): Secondary | ICD-10-CM | POA: Diagnosis not present

## 2019-01-15 DIAGNOSIS — Z0000 Encounter for general adult medical examination without abnormal findings: Secondary | ICD-10-CM | POA: Diagnosis not present

## 2019-01-15 DIAGNOSIS — E1122 Type 2 diabetes mellitus with diabetic chronic kidney disease: Secondary | ICD-10-CM | POA: Diagnosis not present

## 2019-01-15 DIAGNOSIS — N189 Chronic kidney disease, unspecified: Secondary | ICD-10-CM | POA: Diagnosis not present

## 2019-01-15 DIAGNOSIS — I701 Atherosclerosis of renal artery: Secondary | ICD-10-CM | POA: Diagnosis not present

## 2019-01-15 DIAGNOSIS — N2581 Secondary hyperparathyroidism of renal origin: Secondary | ICD-10-CM | POA: Diagnosis not present

## 2019-01-15 DIAGNOSIS — N400 Enlarged prostate without lower urinary tract symptoms: Secondary | ICD-10-CM | POA: Diagnosis not present

## 2019-01-15 DIAGNOSIS — I129 Hypertensive chronic kidney disease with stage 1 through stage 4 chronic kidney disease, or unspecified chronic kidney disease: Secondary | ICD-10-CM | POA: Diagnosis not present

## 2019-01-15 DIAGNOSIS — E785 Hyperlipidemia, unspecified: Secondary | ICD-10-CM | POA: Diagnosis not present

## 2019-01-15 DIAGNOSIS — Z720 Tobacco use: Secondary | ICD-10-CM | POA: Diagnosis not present

## 2019-01-15 DIAGNOSIS — I739 Peripheral vascular disease, unspecified: Secondary | ICD-10-CM | POA: Diagnosis not present

## 2019-01-22 DIAGNOSIS — N189 Chronic kidney disease, unspecified: Secondary | ICD-10-CM | POA: Diagnosis not present

## 2019-02-10 DIAGNOSIS — R531 Weakness: Secondary | ICD-10-CM | POA: Diagnosis not present

## 2019-02-10 DIAGNOSIS — I10 Essential (primary) hypertension: Secondary | ICD-10-CM | POA: Diagnosis not present

## 2019-02-10 DIAGNOSIS — E782 Mixed hyperlipidemia: Secondary | ICD-10-CM | POA: Diagnosis not present

## 2019-02-10 DIAGNOSIS — E1169 Type 2 diabetes mellitus with other specified complication: Secondary | ICD-10-CM | POA: Diagnosis not present

## 2019-02-10 DIAGNOSIS — R0609 Other forms of dyspnea: Secondary | ICD-10-CM | POA: Diagnosis not present

## 2019-02-23 DIAGNOSIS — M6281 Muscle weakness (generalized): Secondary | ICD-10-CM | POA: Diagnosis not present

## 2019-02-23 DIAGNOSIS — N183 Chronic kidney disease, stage 3 (moderate): Secondary | ICD-10-CM | POA: Diagnosis not present

## 2019-02-23 DIAGNOSIS — Z6820 Body mass index (BMI) 20.0-20.9, adult: Secondary | ICD-10-CM | POA: Diagnosis not present

## 2019-03-09 DIAGNOSIS — N183 Chronic kidney disease, stage 3 (moderate): Secondary | ICD-10-CM | POA: Diagnosis not present

## 2019-03-09 DIAGNOSIS — Z6820 Body mass index (BMI) 20.0-20.9, adult: Secondary | ICD-10-CM | POA: Diagnosis not present

## 2019-03-09 DIAGNOSIS — M6281 Muscle weakness (generalized): Secondary | ICD-10-CM | POA: Diagnosis not present

## 2019-05-03 DIAGNOSIS — H532 Diplopia: Secondary | ICD-10-CM | POA: Diagnosis not present

## 2019-05-03 DIAGNOSIS — H04523 Eversion of bilateral lacrimal punctum: Secondary | ICD-10-CM | POA: Diagnosis not present

## 2019-05-03 DIAGNOSIS — H31093 Other chorioretinal scars, bilateral: Secondary | ICD-10-CM | POA: Diagnosis not present

## 2019-05-03 DIAGNOSIS — H43813 Vitreous degeneration, bilateral: Secondary | ICD-10-CM | POA: Diagnosis not present

## 2019-05-03 DIAGNOSIS — Z961 Presence of intraocular lens: Secondary | ICD-10-CM | POA: Diagnosis not present

## 2019-05-06 ENCOUNTER — Other Ambulatory Visit (HOSPITAL_COMMUNITY): Payer: Self-pay | Admitting: Cardiovascular Disease

## 2019-05-06 ENCOUNTER — Other Ambulatory Visit: Payer: Self-pay

## 2019-05-06 ENCOUNTER — Ambulatory Visit (HOSPITAL_COMMUNITY)
Admission: RE | Admit: 2019-05-06 | Discharge: 2019-05-06 | Disposition: A | Discharge status: Home / Self Care | Payer: MEDICARE | Source: Ambulatory Visit | Attending: Cardiology | Admitting: Cardiology

## 2019-05-06 DIAGNOSIS — I701 Atherosclerosis of renal artery: Secondary | ICD-10-CM

## 2019-05-06 DIAGNOSIS — I714 Abdominal aortic aneurysm, without rupture, unspecified: Secondary | ICD-10-CM

## 2019-05-13 DIAGNOSIS — N189 Chronic kidney disease, unspecified: Secondary | ICD-10-CM | POA: Diagnosis not present

## 2019-06-07 ENCOUNTER — Encounter: Payer: Self-pay | Admitting: Cardiovascular Disease

## 2019-06-07 ENCOUNTER — Ambulatory Visit (INDEPENDENT_AMBULATORY_CARE_PROVIDER_SITE_OTHER): Payer: MEDICARE | Admitting: Cardiovascular Disease

## 2019-06-07 ENCOUNTER — Ambulatory Visit (HOSPITAL_COMMUNITY): Payer: MEDICARE | Attending: Cardiovascular Disease

## 2019-06-07 ENCOUNTER — Other Ambulatory Visit: Payer: Self-pay

## 2019-06-07 VITALS — BP 142/72 | HR 72 | Ht 69.0 in | Wt 135.0 lb

## 2019-06-07 DIAGNOSIS — I714 Abdominal aortic aneurysm, without rupture, unspecified: Secondary | ICD-10-CM

## 2019-06-07 DIAGNOSIS — I3139 Other pericardial effusion (noninflammatory): Secondary | ICD-10-CM

## 2019-06-07 DIAGNOSIS — I701 Atherosclerosis of renal artery: Secondary | ICD-10-CM

## 2019-06-07 DIAGNOSIS — I313 Pericardial effusion (noninflammatory): Secondary | ICD-10-CM

## 2019-06-07 NOTE — Patient Instructions (Signed)
Medication Instructions:  Your provider recommends that you continue on your current medications as directed. Please refer to the Current Medication list given to you today.   *If you need a refill on your cardiac medications before your next appointment, please call your pharmacy*  Testing/Procedures: You will have repeat studies done prior to your visit with Dr. Burt Knack in 6 months.  Follow-Up: You will be called to arrange your ultrasounds and visit with Dr. Burt Knack when his schedule is available for April 2021.

## 2019-06-07 NOTE — Progress Notes (Signed)
Cardiology Office Note:    Date:  06/07/2019   ID:  Chris Peterson, DOB 11-12-1929, MRN [id number]  PCP:  Lawerance Cruel, MD  Cardiologist:  Sherren Mocha, MD  Electrophysiologist:  None   Referring MD: Lawerance Cruel, MD   Chief Complaint  Patient presents with  . Fatigue    History of Present Illness:    Chris Peterson is a 85 y.o. male with a hx of idiopathic pericardial effusion and renal artery stenosis, presenting for follow-up evaluation.  He has a history of severe bilateral renal artery stenosis with treatment of the left renal artery by stenting.  That stent has remained patent at follow-up.  His right renal artery stenosis has been managed medically in the setting of well-controlled blood pressure and stable renal function.  He was incidentally noted to have a pericardial effusion many years ago and he has been followed by serial echo studies with no evidence of hemodynamic sequelae.  The patient is here with his daughter today.  He complains of progressive fatigue since I have seen him last year.  States that his legs get very tired when he does much walking at all.  He is not able to do a lot of the things he has done in the past.  He is increasingly tired.  He denies chest pain or shortness of breath.  He denies lightheadedness, heart palpitations, or presyncope.  He has no orthopnea or PND.  He continues to smoke a pipe.  Past Medical History:  Diagnosis Date  . AAA (abdominal aortic aneurysm) (HCC)    Infrarenal AAA - maximal diameter of 3.5 cm   . Atherosclerotic renal artery stenosis, bilateral (HCC)    Bilateral renal angiography 11/27/11, Right 70% prox, Left 90% prox s/p LRA PTA/Stenting - 6.0x38m ;  12/12/2011 Renal Duplex: Patent LRA stent & > 60% RRA ost stenosis  . Blood transfusion    S/P "lost blood due to hemorrhoids"  . Dysrhythmia    "fast one time"  . Hemorrhoids   . High cholesterol   . HTN (hypertension)   . Kidney disease, chronic,  stage III (moderate, EGFR 30-59 ml/min)   . Pericardial effusion    a. incidentally noted on renal u/s 11/2011;  b.  12/16/2011 Echo:  EF 55-60%, Gr1 DD, Mild MR, PASP 32, Mod circumferential peric effusion w/o hemodynamic compromise. Density in pericardial space - consider MRI  . [place]     Past Surgical History:  Procedure Laterality Date  . ABDOMINAL ANGIOGRAM N/A 11/27/2011   Procedure: ABDOMINAL ANGIOGRAM;  Surgeon: MSherren Mocha MD;  [facility];  Service: Cardiovascular;  Laterality: N/A;  . BIOPSY  03/13/2018   Procedure: BIOPSY;  Surgeon: SLadene Artist MD;  Location: [facility] ENDOSCOPY;  Service: Endoscopy;;  . CATARACT EXTRACTION W/ INTRAOCULAR LENS  IMPLANT, BILATERAL  1990's  . ESOPHAGOGASTRODUODENOSCOPY N/A 03/13/2018   Procedure: ESOPHAGOGASTRODUODENOSCOPY (EGD);  Surgeon: SLadene Artist MD;  [facility];  Service: Endoscopy;  Laterality: N/A;  . HOT HEMOSTASIS N/A 03/13/2018   Procedure: HOT HEMOSTASIS (ARGON PLASMA COAGULATION/BICAP);  Surgeon: SLadene Artist MD;  [facility];  Service: Endoscopy;  Laterality: N/A;  . NASAL SEPTUM SURGERY  1980's  . RENAL ANGIOGRAM Bilateral 11/27/2011   Procedure: RENAL ANGIOGRAM;  Surgeon: MSherren Mocha MD;  [facility];  Service: Cardiovascular;  Laterality: Bilateral;  possible PTA/stent  . RENAL ARTERY STENT  11/27/11    Current Medications: Current Meds  Medication Sig  . amLODipine (NORVASC) 5 MG tablet Take 5 mg by mouth daily.  . carvedilol (COREG) 12.5 MG tablet Take 12.5 mg by mouth 2 (two) times daily with a meal.   . cholecalciferol (VITAMIN D) 1000 units tablet Take 1,000 mg by mouth daily.   . famotidine (PEPCID) 40 MG tablet Take 40 mg by mouth daily.  . fenofibrate 160 MG tablet Take 160 mg by mouth daily.  . ferrous sulfate 325 (65 FE) MG tablet Take 325 mg by mouth daily with breakfast.  . furosemide (LASIX) 20 MG tablet Take 20 mg by mouth daily.  [name]  loperamide (IMODIUM) 2 MG capsule Take 1 capsule (2 mg total) by mouth as needed for diarrhea or loose stools.  . LUTEIN-ZEAXANTHIN PO Take by mouth.  . rosuvastatin (CRESTOR) 40 MG tablet Take 40 mg by mouth daily.  . tamsulosin (FLOMAX) 0.4 MG CAPS capsule Take 0.4 mg by mouth daily after breakfast.      Allergies:   Patient has no known allergies.   Social History   Socioeconomic History  . Marital status: Married    Spouse name: Not on file  . Number of children: Not on file  . Years of education: Not on file  . Highest education level: Not on file  Occupational History  . Not on file  Social Needs  . Financial resource strain: Not on file  . Food insecurity    Worry: Not on file    Inability: Not on file  . Transportation needs    Medical: Not on file    Non-medical: Not on file  Tobacco Use  . Smoking status: Current Every Day Smoker    Years: 65.00    Types: Pipe, Cigarettes    Last attempt to quit: 08/19/1996    Years since quitting: 22.8  . Smokeless tobacco: Never Used  . Tobacco comment: 11/27/11 "smoked pipe since 1998; quit cigarettes 1998"  Substance and Sexual Activity  . Alcohol use: Yes    Comment: 03/13/18 2-3 Oz daily   . Drug use: No  . Sexual activity: Never  Lifestyle  . Physical activity    Days per week: Not on file    Minutes per session: Not on file  . Stress: Not on file  Relationships  . Social Herbalist on phone: Not on file    Gets together: Not on file    Attends religious service: Not on file    Active member of club or organization: Not on file    Attends meetings of clubs or organizations: Not on file    Relationship status: Not on file  Other Topics Concern  . Not on file  Social History Narrative  . Not on file     Family History: The patient's family history includes Hypertension in his brother and mother.  ROS:   Please see the history of present illness.    All other systems reviewed and are negative.   EKGs/Labs/Other Studies Reviewed:    The following studies were reviewed today: 2D echo images are reviewed.  The formal interpretation is currently pending from today's study.  The patient has a moderate to large pericardial effusion.  There might be modest increase from his previous echo.  I reviewed both echo studies side-by-side.  I do not appreciate any clear signs of tamponade physiology.  EKG:  EKG is ordered today.  The ekg ordered today demonstrates normal sinus rhythm 69 bpm, right bundle branch block,  otherwise normal.  Recent Labs: No results found for requested labs within last 8760 hours.  Recent Lipid Panel No results found for: CHOL, TRIG, HDL, CHOLHDL, VLDL, LDLCALC, LDLDIRECT  Physical Exam:    VS:  BP (!) 142/72   Pulse 72   Ht _0  (1.753 m)   Wt 135 lb (61.2 kg)   SpO2 98%   BMI 19.94 kg/m     Wt Readings from Last 3 Encounters:  06/07/19 135 lb (61.2 kg)  09/22/18 128 lb (58.1 kg)  06/11/18 135 lb (61.2 kg)     GEN:  Elderly male in no acute distress HEENT: Normal NECK: No JVD; No carotid bruits LYMPHATICS: No lymphadenopathy CARDIAC: RRR, 2/6 SEM at the RUSB, there is no pulsus paradoxus RESPIRATORY:  Clear to auscultation without rales, wheezing or rhonchi  ABDOMEN: Soft, non-tender, non-distended MUSCULOSKELETAL:  No edema; No deformity  SKIN: Warm and dry NEUROLOGIC:  Alert and oriented x 3 PSYCHIATRIC:  Normal affect   ASSESSMENT:    1. Pericardial effusion   2. Abdominal aortic aneurysm (AAA) without rupture ([place])   3. Renal artery stenosis (HCC)    PLAN:    In order of problems listed above:  1. Discussed findings with patient and his daughter.  I do not think there is any relationship between his effusion and symptom of generalized fatigue.  He does not have any clear cardiopulmonary symptoms.  He has now had this pericardial effusion for several years without associated symptoms.  I am going to repeat his echocardiogram in 6 months  2. I reviewed the patient's most recent abdominal ultrasound which showed that the maximal diameter of his abdominal aortic aneurysm is 4.6 cm.  This increased by 0.6 cm from last year's study.  We will repeat in 6 months.  We discussed potential symptoms and consideration of referral to vascular surgery if his aneurysm increases to 5 cm or greater. 3. Continued patency of the patient's renal artery stent on most recent arterial duplex scan.  Contralateral severe renal artery stenosis is managed medically in the setting of well-controlled blood pressure and stable renal function.   Medication Adjustments/Labs and Tests Ordered: Current medicines are reviewed at length with the patient today.  Concerns regarding medicines are outlined above.  Orders Placed This Encounter  Procedures  . EKG 12-Lead  . ECHOCARDIOGRAM COMPLETE   No orders of the defined types were placed in this encounter.   Patient Instructions  Medication Instructions:  Your provider recommends that you continue on your current medications as directed. Please refer to the Current Medication list given to you today.   *If you need a refill on your cardiac medications before your next appointment, please call your pharmacy*  Testing/Procedures: You will have repeat studies done prior to your visit with Dr. Burt Knack in 6 months.  Follow-Up: You will be called to arrange your ultrasounds and visit with Dr. Burt Knack when his schedule is available for April 2021.    Signed, Sherren Mocha, MD  06/07/2019 5:39 PM    [place]

## 2019-06-09 ENCOUNTER — Other Ambulatory Visit: Payer: Self-pay | Admitting: Optometry

## 2019-06-09 ENCOUNTER — Other Ambulatory Visit: Payer: Self-pay

## 2019-06-09 DIAGNOSIS — H532 Diplopia: Secondary | ICD-10-CM

## 2019-06-27 ENCOUNTER — Other Ambulatory Visit: Payer: Self-pay

## 2019-06-27 ENCOUNTER — Ambulatory Visit
Admission: RE | Admit: 2019-06-27 | Discharge: 2019-06-27 | Disposition: A | Discharge status: Home / Self Care | Payer: MEDICARE | Source: Ambulatory Visit | Attending: Optometry | Admitting: Optometry

## 2019-06-27 DIAGNOSIS — H532 Diplopia: Secondary | ICD-10-CM

## 2019-06-27 DIAGNOSIS — H538 Other visual disturbances: Secondary | ICD-10-CM | POA: Diagnosis not present

## 2019-06-27 MED ORDER — GADOBENATE DIMEGLUMINE 529 MG/ML IV SOLN
7.0000 mL | Freq: Once | INTRAVENOUS | Status: AC | PRN
  Administered 2019-06-27: 7 mL via INTRAVENOUS

## 2019-06-30 DIAGNOSIS — H532 Diplopia: Secondary | ICD-10-CM | POA: Diagnosis not present

## 2019-06-30 DIAGNOSIS — H04523 Eversion of bilateral lacrimal punctum: Secondary | ICD-10-CM | POA: Diagnosis not present

## 2019-06-30 DIAGNOSIS — Z961 Presence of intraocular lens: Secondary | ICD-10-CM | POA: Diagnosis not present

## 2019-08-27 DIAGNOSIS — E782 Mixed hyperlipidemia: Secondary | ICD-10-CM | POA: Diagnosis not present

## 2019-08-27 DIAGNOSIS — D649 Anemia, unspecified: Secondary | ICD-10-CM | POA: Diagnosis not present

## 2019-08-27 DIAGNOSIS — E1169 Type 2 diabetes mellitus with other specified complication: Secondary | ICD-10-CM | POA: Diagnosis not present

## 2019-08-27 DIAGNOSIS — N183 Chronic kidney disease, stage 3 unspecified: Secondary | ICD-10-CM | POA: Diagnosis not present

## 2019-08-27 DIAGNOSIS — I10 Essential (primary) hypertension: Secondary | ICD-10-CM | POA: Diagnosis not present

## 2019-08-27 DIAGNOSIS — N400 Enlarged prostate without lower urinary tract symptoms: Secondary | ICD-10-CM | POA: Diagnosis not present

## 2019-08-27 DIAGNOSIS — N401 Enlarged prostate with lower urinary tract symptoms: Secondary | ICD-10-CM | POA: Diagnosis not present

## 2019-09-14 ENCOUNTER — Ambulatory Visit: Payer: MEDICARE

## 2019-09-25 ENCOUNTER — Ambulatory Visit: Payer: Self-pay

## 2019-09-29 ENCOUNTER — Telehealth: Payer: Self-pay | Admitting: Cardiovascular Disease

## 2019-09-29 NOTE — Telephone Encounter (Signed)
The patient's daughter reports the patient has a low grade fever, chills, "terrible congestion," and mild swelling in his ankles. She is concerned the patient has COVID, but he doesn't believe in it and is convinced his symptoms are caused by his kidney problems and CHF. Reiterated to her that infections cause fever and that he may need to be evaluated and/or tested for COVID. Recommended several times she call PCP for evaluation. She will call PCP for latest COVID testing protocol. Per her request, scheduled patient for virtual visit with Dr. Burt Knack this Friday.  Spoke with the patient [phone]). He agreed to virtual visit and plan to have daughter on the phone (since she is not on DPR).  He was grateful for assistance.

## 2019-09-29 NOTE — Telephone Encounter (Signed)
Left message to call back  

## 2019-09-29 NOTE — Telephone Encounter (Signed)
Pt c/o swelling: STAT is pt has developed SOB within 24 hours  1) How much weight have you gained and in what time span? 3lbs in 3 months  2) If swelling, where is the swelling located? Legs and ankles and pain in legs  3) Are you currently taking a fluid pill? no  4) Are you currently SOB? no  5) Do you have a log of your daily weights (if so, list)? no  6) Have you gained 3 pounds in a day or 5 pounds in a week? no  7) Have you traveled recently? No  Patient's daughter states he was having a low grade fever yesterday and is concerned he may have covid. She would like to know if the patient could have a virtual appointment to discuss his swelling and other symptoms. She says he has kidney issues as well. Patient states he has been having some SOB on exertion and that the swelling he has is mainly in the ankles. He says he has not gained much weight. Patient's daughter states she can send photos of his legs if they are needed. Please advise.

## 2019-09-29 NOTE — Telephone Encounter (Signed)
Patient's daughter following up..states she does not want the day to be over before she hears something. She is requesting to do a telehealth visit with Dr. Burt Knack or the DOD today. Patient aware that Dr. Burt Knack is not in the office today.

## 2019-09-30 ENCOUNTER — Other Ambulatory Visit: Payer: MEDICARE

## 2019-10-01 ENCOUNTER — Telehealth (INDEPENDENT_AMBULATORY_CARE_PROVIDER_SITE_OTHER): Payer: MEDICARE | Admitting: Cardiovascular Disease

## 2019-10-01 ENCOUNTER — Encounter: Payer: Self-pay | Admitting: Cardiovascular Disease

## 2019-10-01 ENCOUNTER — Ambulatory Visit: Payer: MEDICARE | Attending: Internal Medicine

## 2019-10-01 ENCOUNTER — Telehealth: Payer: Self-pay

## 2019-10-01 ENCOUNTER — Other Ambulatory Visit: Payer: MEDICARE

## 2019-10-01 ENCOUNTER — Other Ambulatory Visit: Payer: Self-pay

## 2019-10-01 VITALS — BP 154/77 | HR 86 | Ht 69.0 in | Wt 144.0 lb

## 2019-10-01 DIAGNOSIS — I714 Abdominal aortic aneurysm, without rupture, unspecified: Secondary | ICD-10-CM

## 2019-10-01 DIAGNOSIS — Z20822 Contact with and (suspected) exposure to covid-19: Secondary | ICD-10-CM

## 2019-10-01 DIAGNOSIS — I313 Pericardial effusion (noninflammatory): Secondary | ICD-10-CM

## 2019-10-01 DIAGNOSIS — R0602 Shortness of breath: Secondary | ICD-10-CM | POA: Diagnosis not present

## 2019-10-01 DIAGNOSIS — I701 Atherosclerosis of renal artery: Secondary | ICD-10-CM | POA: Diagnosis not present

## 2019-10-01 DIAGNOSIS — I5031 Acute diastolic (congestive) heart failure: Secondary | ICD-10-CM

## 2019-10-01 DIAGNOSIS — I3139 Other pericardial effusion (noninflammatory): Secondary | ICD-10-CM

## 2019-10-01 NOTE — Telephone Encounter (Signed)
[name] had VV with Dr. Burt Knack today. COVID test scheduled for 1400 today. The patient understands he will be called Monday with an update. If resulted and negative, will bring patient in for labs (CBC, CMET, BNP).  Will arrange echo and office visit in a couple weeks pending COVID results. The patient agrees with treatment plan.

## 2019-10-01 NOTE — Progress Notes (Signed)
Virtual Visit via Telephone Note   This visit type was conducted due to national recommendations for restrictions regarding the COVID-19 Pandemic (e.g. social distancing) in an effort to limit this patient's exposure and mitigate transmission in our community.  Due to his co-morbid illnesses, this patient is at least at moderate risk for complications without adequate follow up.  This format is felt to be most appropriate for this patient at this time.  The patient did not have access to video technology/had technical difficulties with video requiring transitioning to audio format only (telephone).  All issues noted in this document were discussed and addressed.  No physical exam could be performed with this format.  Please refer to the patient's chart for his  consent to telehealth for [facility].   Date:  10/01/2019   ID:  Chris Peterson, DOB 06-Aug-1930, MRN [id number]  Patient Location: Home Provider Location: Office  PCP:  Lawerance Cruel, MD  Cardiologist:  Sherren Mocha, MD  Electrophysiologist:  None   Evaluation Performed:  Follow-Up Visit  Chief Complaint: Shortness of breath  History of Present Illness:    Chris Peterson is a 86 y.o. male with a longstanding idiopathic pericardial effusion, renal artery stenosis, and chronic kidney disease, presenting for a virtual visit after calling in with progressive shortness of breath and swelling.  He is interviewed over the telephone today because of the Covid 19 pandemic. He  reports swelling in his legs, worsening over the last month. He is feeling very poorly with progressive weakness.  He states that his shoes are tight and difficult to get on his feet.  He has not had chest pain or pressure.  He denies orthopnea or PND.  He has chronic dyspnea which has not significantly changed.  He denies lightheadedness or syncope.  He reports drinking at most 3 to 4 glasses of water a day.  He drinks a small amount of soda.  He has 2  ounces of alcohol in the afternoon per his report.  The patient has been followed closely over the years with serial echo studies as well as renal arterial Doppler studies. He last saw Dr Jimmy Footman in May 2020 but hasn't had renal follow-up since then.   The patient does have symptoms concerning for COVID-19 infection (fever, chills, cough, or new shortness of breath).  This includes the presence of chills.  He specifically denies fever and he has checked his temperature.   Past Medical History:  Diagnosis Date  . AAA (abdominal aortic aneurysm) (HCC)    Infrarenal AAA - maximal diameter of 3.5 cm   . Atherosclerotic renal artery stenosis, bilateral (HCC)    Bilateral renal angiography 11/27/11, Right 70% prox, Left 90% prox s/p LRA PTA/Stenting - 6.0x73m ;  12/12/2011 Renal Duplex: Patent LRA stent & > 60% RRA ost stenosis  . Blood transfusion    S/P "lost blood due to hemorrhoids"  . Dysrhythmia    "fast one time"  . Hemorrhoids   . High cholesterol   . HTN (hypertension)   . Kidney disease, chronic, stage III (moderate, EGFR 30-59 ml/min)   . Pericardial effusion    a. incidentally noted on renal u/s 11/2011;  b.  12/16/2011 Echo:  EF 55-60%, Gr1 DD, Mild MR, PASP 32, Mod circumferential peric effusion w/o hemodynamic compromise. Density in pericardial space - consider MRI  . [place] II diabetes [place]    Past Surgical History:  Procedure Laterality Date  . ABDOMINAL ANGIOGRAM N/A 11/27/2011  Procedure: ABDOMINAL ANGIOGRAM;  Surgeon: Sherren Mocha, MD;  Location: [facility] CATH LAB;  Service: Cardiovascular;  Laterality: N/A;  . BIOPSY  03/13/2018   Procedure: BIOPSY;  Surgeon: Ladene Artist, MD;  Location: [facility] ENDOSCOPY;  Service: Endoscopy;;  . CATARACT EXTRACTION W/ INTRAOCULAR LENS  IMPLANT, BILATERAL  1990's  . ESOPHAGOGASTRODUODENOSCOPY N/A 03/13/2018   Procedure: ESOPHAGOGASTRODUODENOSCOPY (EGD);  Surgeon: Ladene Artist, MD;  Location: [facility] ENDOSCOPY;  Service: Endoscopy;   Laterality: N/A;  . HOT HEMOSTASIS N/A 03/13/2018   Procedure: HOT HEMOSTASIS (ARGON PLASMA COAGULATION/BICAP);  Surgeon: Ladene Artist, MD;  Location: [facility] ENDOSCOPY;  Service: Endoscopy;  Laterality: N/A;  . NASAL SEPTUM SURGERY  1980's  . RENAL ANGIOGRAM Bilateral 11/27/2011   Procedure: RENAL ANGIOGRAM;  Surgeon: Sherren Mocha, MD;  Location: [facility] CATH LAB;  Service: Cardiovascular;  Laterality: Bilateral;  possible PTA/stent  . RENAL ARTERY STENT  11/27/11     Current Meds  Medication Sig  . amLODipine (NORVASC) 5 MG tablet Take 5 mg by mouth daily.  . carvedilol (COREG) 12.5 MG tablet Take 12.5 mg by mouth 2 (two) times daily with a meal.   . cholecalciferol (VITAMIN D) 1000 units tablet Take 1,000 mg by mouth daily.   . famotidine (PEPCID) 40 MG tablet Take 40 mg by mouth daily.  . furosemide (LASIX) 20 MG tablet Take 20 mg by mouth daily.  [name] loperamide (IMODIUM) 2 MG capsule Take 1 capsule (2 mg total) by mouth as needed for diarrhea or loose stools.  . LUTEIN-ZEAXANTHIN PO Take by mouth.  . rosuvastatin (CRESTOR) 40 MG tablet Take 40 mg by mouth daily.  . tamsulosin (FLOMAX) 0.4 MG CAPS capsule Take 0.4 mg by mouth.     Allergies:   Patient has no known allergies.   Social History   Tobacco Use  . Smoking status: Current Every Day Smoker    Years: 65.00    Types: Pipe, Cigarettes    Last attempt to quit: 08/19/1996    Years since quitting: 23.1  . Smokeless tobacco: Never Used  . Tobacco comment: 11/27/11 "smoked pipe since 1998; quit cigarettes 1998"  Substance Use Topics  . Alcohol use: Yes    Comment: 03/13/18 2-3 Oz daily   . Drug use: No     Family Hx: The patient's family history includes Hypertension in his brother and mother.  ROS:   Please see the history of present illness.    All other systems reviewed and are negative.   Prior CV studies:   The following studies were reviewed today:  Echo 06-07-2019: 1. There is a moderate to large pericardial  effusion present. There is  fibrous material present, indicating some chronicity of the effusion. The  effusion is largest in the subcostal windows up to 2.9 cm. There is no  diastolic RV collapse and there is no  significant variation in mitral valve inflow. The IVC is normal size and  collapsable. There is no echocardiographic evidence of tamponade. The  effusion is larger compared with 06/03/2018, but it is still of no  hemodynamic significance.  2. Left ventricular ejection fraction, by visual estimation, is 60 to  65%. The left ventricle has normal function. Normal left ventricular size.  There is mildly increased left ventricular hypertrophy.  3. The AoV is severely calcified. There is significant focal  calcification of the NCC and this leaflet has substantial restricted  motion. The aortic annulus is severely calcified. There is mild aortic  stenosis present.  4. Left ventricular  diastolic Doppler parameters are consistent with  impaired relaxation pattern of LV diastolic filling.  5. Global right ventricle has normal systolic function.The right  ventricular size is normal. No increase in right ventricular wall  thickness.  6. Left atrial size was normal.  7. Right atrial size was normal.  8. Large pericardial effusion.  9. The pericardial effusion is circumferential.  10. The mitral valve is degenerative. Mild mitral valve regurgitation.  11. The tricuspid valve is normal in structure. Tricuspid valve  regurgitation was not visualized by color flow Doppler.  12. The aortic valve is tricuspid Aortic valve regurgitation was not  visualized by color flow Doppler. Mild aortic valve stenosis.  13. There is Moderate thickening of the aortic valve.  14. There is Severe calcifcation of the aortic valve.  15. The pulmonic valve was grossly normal. Pulmonic valve regurgitation is  not visualized by color flow Doppler.  16. Severe plaque invoving the ascending aorta.  17. The  inferior vena cava is normal in size with greater than 50%  respiratory variability, suggesting right atrial pressure of 3 mmHg.  18. The interatrial septum appears to be lipomatous.  19. Mild to moderate mitral annular calcification.   Renal Doppler 05-06-2019: Summary:  Largest Aortic Diameter: 4.6 cm. Mid to distal fusiform AAA. This is a .6  cm  increase from prior exam.    Renal:    Right: Normal size right kidney. Abnormal right Resistive Index.     Abnormal cortical thickness of right kidney. Decrease     stenosis, now 1-59% of the right renal artery. RRV flow     present. Probable mild perinephric fluid at the right kidney.  Left: No evidence of left renal artery stenosis, s/p stent     placement. Normal size of left kidney. Abnormal left     Resistive Index. Abnormal cortical thickness of the left     kidney. LRV flow present. Inhomogeneous lesion noted in the     upper pole of the left kidney, measuring 3.0 x 3.0 x 3.3 cm.     Dilated left renal ureter, measuring 1.2 cm.  Mesenteric:  Normal Superior Mesenteric artery findings. 70 to 99% stenosis in the  celiac  artery.    Patent IVC.    *See table(s) above for measurements and observations.    Suggest follow up study in 6 months for AAA duplex.    Suggest follow up study in 12 months for renal duplex.   Brain MRI: IMPRESSION: 1.  No acute intracranial abnormality. 2. Essentially normal for age MRI appearance of the brain. 3. Normal MRI appearance of the orbits; prior cataract surgery. 4. Internal auditory structures also appear normal.  Labs/Other Tests and Data Reviewed:    EKG:  An ECG dated June 07, 2019 was personally reviewed today and demonstrated:  Sinus rhythm with right bundle branch block, unchanged from previous  Recent Labs: No results found for requested labs within last 8760 hours.   Recent Lipid Panel No results found for: CHOL, TRIG, HDL, CHOLHDL,  LDLCALC, LDLDIRECT  Wt Readings from Last 3 Encounters:  10/01/19 144 lb (65.3 kg)  06/07/19 135 lb (61.2 kg)  09/22/18 128 lb (58.1 kg)     Objective:    Vital Signs:  BP (!) 154/77   Pulse 86   Ht _0  (1.753 m)   Wt 144 lb (65.3 kg)   BMI 21.27 kg/m    VITAL SIGNS:  reviewed The patient is breathing comfortably in conversation.  He is alert, oriented, in no distress.  Further examination is not possible as this is a telephone visit type.  ASSESSMENT & PLAN:    1. Pericardial effusion: Concerning that he now has symptoms suggestive of congestive heart failure.  We will update an echocardiogram.  His last echo demonstrated an increase in size of his pericardial effusion, but there was no echo evidence of tamponade.  His IVC was collapsible and there was no variation in mitral inflow pattern.  Fortunately he does not have symptoms of orthopnea, PND, or resting shortness of breath. 2. Acute diastolic heart failure: LV function has been preserved in the past.  He has significant edema and 16 pound weight gain from baseline.  This might be a combination of congestive heart failure and kidney disease.  His last creatinine was pretty good at 1.32 mg/dL.  Will update his labs.  This will include a BNP and a metabolic panel.  I have asked him to increase furosemide to 40 mg daily.  We will give further instructions based on his lab work. 3. Abdominal aortic aneurysm: He had a significant increase in diameter of his abdominal aorta at the time of his last ultrasound.  This will be updated for 64-monthfollow-up. 4. Renal artery stenosis with stage IIIb chronic kidney disease: Will update labs.  Current medications are reviewed.  I specifically discussed reduction in alcohol, adequate fluid hydration without drinking excessive amounts because of his edema and suggestion of heart failure.  Will evaluate at the time of his next ultrasound study.  We will check labs as outlined above.  The patient has  a complex history and acute problems with edema and progressive weakness as outlined above.  We are checking labs and an echocardiogram.  We will bring him back for close in person follow-up.  I have also recommended that he have a COVID-19 test since he has had chills and weakness.  I suspect his symptoms are secondary to heart failure, but have recommended testing for COVID-19 out of an abundance of caution.  COVID-19 Education: The signs and symptoms of COVID-19 were discussed with the patient and how to seek care for testing (follow up with PCP or arrange E-visit).  The importance of social distancing was discussed today.  Time:   Today, I have spent 35 minutes with the patient with telehealth technology discussing the above problems.     Medication Adjustments/Labs and Tests Ordered: Current medicines are reviewed at length with the patient today.  Concerns regarding medicines are outlined above.   Tests Ordered: No orders of the defined types were placed in this encounter.   Medication Changes: No orders of the defined types were placed in this encounter.   Follow Up:  In Person next available (after testing completed)  Signed, MSherren Mocha MD  10/01/2019 11:02 AM    [place]

## 2019-10-01 NOTE — Patient Instructions (Addendum)
Medication Instructions:  1) INCREASE LASIX to 40 mg daily *If you need a refill on your cardiac medications before your next appointment, please call your pharmacy*  Lab Work: Please proceed to [facility] testing site ([address] for a COVID test today. You are scheduled at 2:00PM.  I will call you Monday to discuss lab scheduling.  Follow-Up:

## 2019-10-02 LAB — NOVEL CORONAVIRUS, NAA: SARS-CoV-2, NAA: NOT DETECTED

## 2019-10-04 ENCOUNTER — Other Ambulatory Visit: Payer: MEDICARE

## 2019-10-04 ENCOUNTER — Other Ambulatory Visit: Payer: Self-pay

## 2019-10-04 DIAGNOSIS — I3139 Other pericardial effusion (noninflammatory): Secondary | ICD-10-CM

## 2019-10-04 DIAGNOSIS — I313 Pericardial effusion (noninflammatory): Secondary | ICD-10-CM | POA: Diagnosis not present

## 2019-10-04 DIAGNOSIS — R0602 Shortness of breath: Secondary | ICD-10-CM

## 2019-10-04 NOTE — Telephone Encounter (Signed)
Informed patient his COVID test was negative.  Scheduled him for blood work today.  He was grateful for assistance.

## 2019-10-05 LAB — COMPREHENSIVE METABOLIC PANEL
ALT: 21 IU/L (ref 0–44)
AST: 39 IU/L (ref 0–40)
Albumin/Globulin Ratio: 1.5 (ref 1.2–2.2)
Albumin: 3.5 g/dL — ABNORMAL LOW (ref 3.6–4.6)
Alkaline Phosphatase: 84 IU/L (ref 39–117)
BUN/Creatinine Ratio: 20 (ref 10–24)
BUN: 31 mg/dL — ABNORMAL HIGH (ref 8–27)
Bilirubin Total: 0.4 mg/dL (ref 0.0–1.2)
CO2: 20 mmol/L (ref 20–29)
Calcium: 8.7 mg/dL (ref 8.6–10.2)
Chloride: 105 mmol/L (ref 96–106)
Creatinine, Ser: 1.57 mg/dL — ABNORMAL HIGH (ref 0.76–1.27)
GFR calc Af Amer: 45 mL/min/{1.73_m2} — ABNORMAL LOW (ref >59)
GFR calc non Af Amer: 39 mL/min/{1.73_m2} — ABNORMAL LOW (ref >59)
Globulin, Total: 2.4 g/dL (ref 1.5–4.5)
Glucose: 115 mg/dL — ABNORMAL HIGH (ref 65–99)
Potassium: 4.1 mmol/L (ref 3.5–5.2)
Sodium: 143 mmol/L (ref 134–144)
Total Protein: 5.9 g/dL — ABNORMAL LOW (ref 6.0–8.5)

## 2019-10-05 LAB — CBC WITH DIFFERENTIAL/PLATELET
Basophils Absolute: 0.2 10*3/uL (ref 0.0–0.2)
Basos: 3 %
EOS (ABSOLUTE): 0.1 10*3/uL (ref 0.0–0.4)
Eos: 1 %
Hematocrit: 34.6 % — ABNORMAL LOW (ref 37.5–51.0)
Hemoglobin: 11.2 g/dL — ABNORMAL LOW (ref 13.0–17.7)
Lymphocytes Absolute: 1.4 10*3/uL (ref 0.7–3.1)
Lymphs: 17 %
MCH: 26.5 pg — ABNORMAL LOW (ref 26.6–33.0)
MCHC: 32.4 g/dL (ref 31.5–35.7)
MCV: 82 fL (ref 79–97)
Monocytes Absolute: 1 10*3/uL — ABNORMAL HIGH (ref 0.1–0.9)
Monocytes: 12 %
Neutrophils Absolute: 5.6 10*3/uL (ref 1.4–7.0)
Neutrophils: 67 %
Platelets: 230 10*3/uL (ref 150–450)
RBC: 4.22 x10E6/uL (ref 4.14–5.80)
RDW: 14.5 % (ref 11.6–15.4)
WBC: 8.3 10*3/uL (ref 3.4–10.8)

## 2019-10-05 LAB — PRO B NATRIURETIC PEPTIDE: NT-Pro BNP: 1343 pg/mL — ABNORMAL HIGH (ref 0–486)

## 2019-10-05 MED ORDER — FUROSEMIDE 20 MG PO TABS: 40.0000 mg | ORAL_TABLET | Freq: Every day | ORAL | 3 refills | Status: DC

## 2019-10-05 NOTE — Telephone Encounter (Signed)
See lab results. The patient will stay on Lasix 40 mg daily and call if symptoms worsen. Confirmed echo/OV with Dr. Burt Knack 2/22.  He was grateful for assistance and agrees with treatment plan.

## 2019-10-11 ENCOUNTER — Ambulatory Visit (HOSPITAL_COMMUNITY): Payer: MEDICARE | Attending: Cardiovascular Disease

## 2019-10-11 ENCOUNTER — Ambulatory Visit (INDEPENDENT_AMBULATORY_CARE_PROVIDER_SITE_OTHER): Payer: MEDICARE | Admitting: Cardiovascular Disease

## 2019-10-11 ENCOUNTER — Encounter: Payer: Self-pay | Admitting: Cardiovascular Disease

## 2019-10-11 ENCOUNTER — Telehealth: Payer: Self-pay

## 2019-10-11 ENCOUNTER — Other Ambulatory Visit: Payer: Self-pay

## 2019-10-11 VITALS — BP 136/64 | HR 68 | Ht 69.0 in | Wt 138.0 lb

## 2019-10-11 DIAGNOSIS — I129 Hypertensive chronic kidney disease with stage 1 through stage 4 chronic kidney disease, or unspecified chronic kidney disease: Secondary | ICD-10-CM | POA: Diagnosis not present

## 2019-10-11 DIAGNOSIS — M7989 Other specified soft tissue disorders: Secondary | ICD-10-CM | POA: Diagnosis not present

## 2019-10-11 DIAGNOSIS — I701 Atherosclerosis of renal artery: Secondary | ICD-10-CM | POA: Insufficient documentation

## 2019-10-11 DIAGNOSIS — I3139 Other pericardial effusion (noninflammatory): Secondary | ICD-10-CM

## 2019-10-11 DIAGNOSIS — I313 Pericardial effusion (noninflammatory): Secondary | ICD-10-CM | POA: Diagnosis not present

## 2019-10-11 DIAGNOSIS — N189 Chronic kidney disease, unspecified: Secondary | ICD-10-CM | POA: Insufficient documentation

## 2019-10-11 DIAGNOSIS — E785 Hyperlipidemia, unspecified: Secondary | ICD-10-CM | POA: Diagnosis not present

## 2019-10-11 DIAGNOSIS — I714 Abdominal aortic aneurysm, without rupture: Secondary | ICD-10-CM | POA: Diagnosis not present

## 2019-10-11 DIAGNOSIS — E1122 Type 2 diabetes mellitus with diabetic chronic kidney disease: Secondary | ICD-10-CM | POA: Insufficient documentation

## 2019-10-11 DIAGNOSIS — I351 Nonrheumatic aortic (valve) insufficiency: Secondary | ICD-10-CM | POA: Insufficient documentation

## 2019-10-11 DIAGNOSIS — R0602 Shortness of breath: Secondary | ICD-10-CM

## 2019-10-11 NOTE — Patient Instructions (Addendum)
COVID SCREENING INFORMATION (2/24): You are scheduled for your COVID screening on Wednesday, February 24 at 10:45AM. [facility] ([facility]) [address] Stay in the RIGHT lane and proceed under the brick awning (NOT the tent) and tell them you are there for pre-procedure testing Do NOT bring any pets with you to the testing site  CATHETERIZATION/PERICARDIOCENTESIS INSTRUCTIONS (2/25): You are scheduled for a Cardiac Catheterization and pericardiocentesis on Thursday, February 25 with Dr. Sherren Mocha.  1. Please arrive at the [facility] (Main Entrance A) at [facility]: [address] [place], [place] [address] at 5:30AM. (This time is two hours before your procedure to ensure your preparation). Free valet parking service is available. You are allowed ONE visitor in the waiting room during your procedure. Both you and your visitor must wear masks. Special note: Every effort is made to have your procedure done on time. Please understand that emergencies sometimes delay scheduled procedures.  2. Diet: Do not eat or drink anything after midnight except your medications with sips or water.  3. Labs: TODAY: BMET, CBC  4. Medication instructions in preparation for your procedure:  1) HOLD LASIX the morning of your cath  2) You may take your other medications as directed   5. Plan for one night stay--bring personal belongings. 6. Bring a current list of your medications and current insurance cards. 7. You MUST have a responsible person to drive you home. 8. Someone MUST be with you the first 24 hours after you arrive home or your discharge will be delayed. 9. Please wear clothes that are easy to get on and off and wear slip-on shoes.  Thank you for allowing [place] to care for you!   -- [facility]  FOLLOW-UP APPOINTMENT (3/9): You are scheduled with Richardson Dopp on 3/9 at 2:15PM.

## 2019-10-11 NOTE — Progress Notes (Signed)
Cardiology Office Note:    Date:  10/11/2019   ID:  Chris Peterson, DOB 09/27/1929, MRN [id number]  PCP:  Lawerance Cruel, MD  Cardiologist:  Sherren Mocha, MD  Electrophysiologist:  None   Referring MD: Lawerance Cruel, MD   Chief Complaint  Patient presents with  . Fatigue    History of Present Illness:    Chris Peterson is a 86 y.o. male with a hx of longstanding idiopathic pericardial effusion, renal artery stenosis, abdominal aortic aneurysm, and chronic kidney disease.  He called in recently with signs and symptoms of heart failure with progressive weakness, leg swelling, and weight gain.  A telemedicine visit was performed October 01, 2019.  His diuretics were adjusted with an increase in furosemide and he has had some clinical improvement with that.  His leg swelling has improved.  He continues to struggle with generalized weakness which has been progressive over the last 6 months. he complains of dizziness but no presyncope.  The patient is here with his daughter today.  He denies shortness of breath, orthopnea, or PND.  He complains of poor sleep.  He has occasional chest pain but no exertional symptoms.  He has been less active lately.  He just had an echocardiogram performed prior to his office visit today.  Past Medical History:  Diagnosis Date  . AAA (abdominal aortic aneurysm) (HCC)    Infrarenal AAA - maximal diameter of 3.5 cm   . Atherosclerotic renal artery stenosis, bilateral (HCC)    Bilateral renal angiography 11/27/11, Right 70% prox, Left 90% prox s/p LRA PTA/Stenting - 6.0x27m ;  12/12/2011 Renal Duplex: Patent LRA stent & > 60% RRA ost stenosis  . Blood transfusion    S/P "lost blood due to hemorrhoids"  . Dysrhythmia    "fast one time"  . Hemorrhoids   . High cholesterol   . HTN (hypertension)   . Kidney disease, chronic, stage III (moderate, EGFR 30-59 ml/min)   . Pericardial effusion    a. incidentally noted on renal u/s 11/2011;  b.   12/16/2011 Echo:  EF 55-60%, Gr1 DD, Mild MR, PASP 32, Mod circumferential peric effusion w/o hemodynamic compromise. Density in pericardial space - consider MRI  . Type II diabetes mellitus ([place]     Past Surgical History:  Procedure Laterality Date  . ABDOMINAL ANGIOGRAM N/A 11/27/2011   Procedure: ABDOMINAL ANGIOGRAM;  Surgeon: MSherren Mocha MD;  [facility];  Service: Cardiovascular;  Laterality: N/A;  . BIOPSY  03/13/2018   Procedure: BIOPSY;  Surgeon: SLadene Artist MD;  Location: [facility];  Service: Endoscopy;;  . CATARACT EXTRACTION W/ INTRAOCULAR LENS  IMPLANT, BILATERAL  1990's  . ESOPHAGOGASTRODUODENOSCOPY N/A 03/13/2018   Procedure: ESOPHAGOGASTRODUODENOSCOPY (EGD);  Surgeon: SLadene Artist MD;  [facility];  Service: Endoscopy;  Laterality: N/A;  . HOT HEMOSTASIS N/A 03/13/2018   Procedure: HOT HEMOSTASIS (ARGON PLASMA COAGULATION/BICAP);  Surgeon: SLadene Artist MD;  [facility];  Service: Endoscopy;  Laterality: N/A;  . NASAL SEPTUM SURGERY  1980's  . RENAL ANGIOGRAM Bilateral 11/27/2011   Procedure: RENAL ANGIOGRAM;  Surgeon: MSherren Mocha MD;  [facility];  Service: Cardiovascular;  Laterality: Bilateral;  possible PTA/stent  . RENAL ARTERY STENT  11/27/11    Current Medications: Current Meds  Medication Sig  . amLODipine (NORVASC) 5 MG tablet Take 5 mg by mouth daily.  . carvedilol (COREG) 12.5 MG tablet Take 12.5 mg by mouth 2 (two) times daily with  a meal.   . cholecalciferol (VITAMIN D) 1000 units tablet Take 1,000 mg by mouth daily.   . famotidine (PEPCID) 40 MG tablet Take 40 mg by mouth daily.  . furosemide (LASIX) 20 MG tablet Take 2 tablets (40 mg total) by mouth daily.  . IRON, FERROUS SULFATE, PO Take 65 mg by mouth daily.  [name] loperamide (IMODIUM) 2 MG capsule Take 1 capsule (2 mg total) by mouth as needed for diarrhea or loose stools.  . LUTEIN-ZEAXANTHIN PO Take by mouth.  . rosuvastatin (CRESTOR) 40 MG  tablet Take 40 mg by mouth daily.  . tamsulosin (FLOMAX) 0.4 MG CAPS capsule Take 0.4 mg by mouth.     Allergies:   Patient has no known allergies.   Social History   Socioeconomic History  . Marital status: Married    Spouse name: Not on file  . Number of children: Not on file  . Years of education: Not on file  . Highest education level: Not on file  Occupational History  . Not on file  Tobacco Use  . Smoking status: Current Every Day Smoker    Years: 65.00    Types: Pipe, Cigarettes    Last attempt to quit: 08/19/1996    Years since quitting: 23.1  . Smokeless tobacco: Never Used  . Tobacco comment: 11/27/11 "smoked pipe since 1998; quit cigarettes 1998"  Substance and Sexual Activity  . Alcohol use: Yes    Comment: 03/13/18 2-3 Oz daily   . Drug use: No  . Sexual activity: Never  Other Topics Concern  . Not on file  Social History Narrative  . Not on file   Social Determinants of Health   Financial Resource Strain:   . Difficulty of Paying Living Expenses: Not on file  Food Insecurity:   . Worried About Charity fundraiser in the Last Year: Not on file  . Ran Out of Food in the Last Year: Not on file  Transportation Needs:   . Lack of Transportation (Medical): Not on file  . Lack of Transportation (Non-Medical): Not on file  Physical Activity:   . Days of Exercise per Week: Not on file  . Minutes of Exercise per Session: Not on file  Stress:   . Feeling of Stress : Not on file  Social Connections:   . Frequency of Communication with Friends and Family: Not on file  . Frequency of Social Gatherings with Friends and Family: Not on file  . Attends Religious Services: Not on file  . Active Member of Clubs or Organizations: Not on file  . Attends Archivist Meetings: Not on file  . Marital Status: Not on file     Family History: The patient's family history includes Hypertension in his brother and mother.  ROS:   Please see the history of present  illness.    All other systems reviewed and are negative.  EKGs/Labs/Other Studies Reviewed:    The following studies were reviewed today: Echo: Normal LV function, large pericardial effusion without clear signs of tamponade. Formal interpretation pending.  EKG:  EKG is not ordered today.  Recent Labs: 10/04/2019: ALT 21; BUN 31; Creatinine, Ser 1.57; Hemoglobin 11.2; NT-Pro BNP 1,343; Platelets 230; Potassium 4.1; Sodium 143  Recent Lipid Panel No results found for: CHOL, TRIG, HDL, CHOLHDL, VLDL, LDLCALC, LDLDIRECT  Physical Exam:    VS:  BP 136/64   Pulse 68   Ht _0  (1.753 m)   Wt 138 lb (62.6 kg)  SpO2 98%   BMI 20.38 kg/m     Wt Readings from Last 3 Encounters:  10/11/19 138 lb (62.6 kg)  10/01/19 144 lb (65.3 kg)  06/07/19 135 lb (61.2 kg)     GEN: elderly male in no acute distress HEENT: Normal NECK: Mild JVD; No carotid bruits LYMPHATICS: No lymphadenopathy CARDIAC: RRR, no murmurs, rubs, gallops. distant heart sounds.  RESPIRATORY:  Clear to auscultation without rales, wheezing or rhonchi  ABDOMEN: Soft, non-tender, non-distended MUSCULOSKELETAL:  1+ edema right ankle, none on the left; No deformity  SKIN: Warm and dry NEUROLOGIC:  Alert and oriented x 3 PSYCHIATRIC:  Normal affect   ASSESSMENT:    1. Pericardial effusion    PLAN:    In order of problems listed above:  1. I reviewed the patient's echo study and demonstrated the images with the patient and his daughter today.  He does have a large pericardial effusion without major change from his study in October.  However, compared to the 2019 echo, his most recent studies have shown significant increase in the size of the pericardial effusion.  I do not appreciate any obvious echo signs of tamponade.  Clinically, the patient has had a change in his symptoms, now with evidence of acute diastolic heart failure requiring titration of his diuretics.  He has had increased edema and fatigue as well as an  elevated BNP.  I think with his change in symptoms, it is reasonable to proceed with pericardiocentesis.  We talked about treatment options at length today, including ongoing observation and diuretic therapy, needle pericardiocentesis, or subxiphoid pericardial window.  I think it is reasonable to consider needle pericardiocentesis and reserve a surgical window for recurrent effusion.  I reviewed the risks, indications, and alternatives to pericardiocentesis with the patient at length.  I quoted him a 1 to 2% chance of a major complication such as cardiac injury, arrhythmia, hypotension, or MI.  I have recommended a noncontrast CT scan of the chest before proceeding.  This will evaluate for any lung pathology in this elderly, longtime smoker.  This will also further evaluate the characteristics of his chronic pericardial effusion.  I have also recommended a right heart catheterization at the time of pericardiocentesis to evaluate his hemodynamics.  All of their questions are answered today.  He will be scheduled for a noncontrasted chest CT and pericardiocentesis.  He will continue his current medical program for now.   Medication Adjustments/Labs and Tests Ordered: Current medicines are reviewed at length with the patient today.  Concerns regarding medicines are outlined above.  Orders Placed This Encounter  Procedures  . CT Chest Wo Contrast  . Basic metabolic panel  . CBC with Differential/Platelet   No orders of the defined types were placed in this encounter.   Patient Instructions  COVID SCREENING INFORMATION (2/24): You are scheduled for your COVID screening on Wednesday, February 24 at 10:45AM. [facility] ([facility] [address] Stay in the RIGHT lane and proceed under the brick awning (NOT the tent) and tell them you are there for pre-procedure testing Do NOT bring any pets with you to the testing site  CATHETERIZATION/PERICARDIOCENTESIS INSTRUCTIONS  (2/25): You are scheduled for a Cardiac Catheterization and pericardiocentesis on Thursday, February 25 with Dr. Sherren Mocha.  1. Please arrive at the [facility] (Main Entrance A) at [facility]: [address] [place], [place] [address] at 5:30AM. (This time is two hours before your procedure to ensure your  preparation). Free valet parking service is available. You are allowed ONE visitor in the waiting room during your procedure. Both you and your visitor must wear masks. Special note: Every effort is made to have your procedure done on time. Please understand that emergencies sometimes delay scheduled procedures.  2. Diet: Do not eat or drink anything after midnight except your medications with sips or water.  3. Labs: TODAY: BMET, CBC  4. Medication instructions in preparation for your procedure:  1) HOLD LASIX the morning of your cath  2) You may take your other medications as directed   5. Plan for one night stay--bring personal belongings. 6. Bring a current list of your medications and current insurance cards. 7. You MUST have a responsible person to drive you home. 8. Someone MUST be with you the first 24 hours after you arrive home or your discharge will be delayed. 9. Please wear clothes that are easy to get on and off and wear slip-on shoes.  Thank you for allowing [place] to care for you!   -- [place] [facility]  FOLLOW-UP APPOINTMENT (3/9): You are scheduled with Richardson Dopp on 3/9 at 2:15PM.    Signed, Sherren Mocha, MD  10/11/2019 11:30 AM    [place]

## 2019-10-11 NOTE — Telephone Encounter (Signed)
The patient was unable to schedule RHC and pericardiocentesis at visit today. Called and arranged procedures 3/10. He will have COVID screen 3/8. He understands he will be called to confirm arrival time once pre-procedure instructed are clarified with Dr. Burt Knack (hydration). He was grateful for assistance.

## 2019-10-11 NOTE — H&P (View-Only) (Signed)
Cardiology Office Note:    Date:  10/11/2019   ID:  Chris Peterson, DOB 09/27/1929, MRN [id number]  PCP:  Lawerance Cruel, MD  Cardiologist:  Sherren Mocha, MD  Electrophysiologist:  None   Referring MD: Lawerance Cruel, MD   Chief Complaint  Patient presents with  . Fatigue    History of Present Illness:    XAIVER Peterson is a 86 y.o. male with a hx of longstanding idiopathic pericardial effusion, renal artery stenosis, abdominal aortic aneurysm, and chronic kidney disease.  He called in recently with signs and symptoms of heart failure with progressive weakness, leg swelling, and weight gain.  A telemedicine visit was performed October 01, 2019.  His diuretics were adjusted with an increase in furosemide and he has had some clinical improvement with that.  His leg swelling has improved.  He continues to struggle with generalized weakness which has been progressive over the last 6 months. he complains of dizziness but no presyncope.  The patient is here with his daughter today.  He denies shortness of breath, orthopnea, or PND.  He complains of poor sleep.  He has occasional chest pain but no exertional symptoms.  He has been less active lately.  He just had an echocardiogram performed prior to his office visit today.  Past Medical History:  Diagnosis Date  . AAA (abdominal aortic aneurysm) (HCC)    Infrarenal AAA - maximal diameter of 3.5 cm   . Atherosclerotic renal artery stenosis, bilateral (HCC)    Bilateral renal angiography 11/27/11, Right 70% prox, Left 90% prox s/p LRA PTA/Stenting - 6.0x27m ;  12/12/2011 Renal Duplex: Patent LRA stent & > 60% RRA ost stenosis  . Blood transfusion    S/P "lost blood due to hemorrhoids"  . Dysrhythmia    "fast one time"  . Hemorrhoids   . High cholesterol   . HTN (hypertension)   . Kidney disease, chronic, stage III (moderate, EGFR 30-59 ml/min)   . Pericardial effusion    a. incidentally noted on renal u/s 11/2011;  b.   12/16/2011 Echo:  EF 55-60%, Gr1 DD, Mild MR, PASP 32, Mod circumferential peric effusion w/o hemodynamic compromise. Density in pericardial space - consider MRI  . Type II diabetes mellitus ([place]     Past Surgical History:  Procedure Laterality Date  . ABDOMINAL ANGIOGRAM N/A 11/27/2011   Procedure: ABDOMINAL ANGIOGRAM;  Surgeon: MSherren Mocha MD;  [facility];  Service: Cardiovascular;  Laterality: N/A;  . BIOPSY  03/13/2018   Procedure: BIOPSY;  Surgeon: SLadene Artist MD;  [facility];  Service: Endoscopy;;  . CATARACT EXTRACTION W/ INTRAOCULAR LENS  IMPLANT, BILATERAL  1990's  . ESOPHAGOGASTRODUODENOSCOPY N/A 03/13/2018   Procedure: ESOPHAGOGASTRODUODENOSCOPY (EGD);  Surgeon: SLadene Artist MD;  [facility];  Service: Endoscopy;  Laterality: N/A;  . HOT HEMOSTASIS N/A 03/13/2018   Procedure: HOT HEMOSTASIS (ARGON PLASMA COAGULATION/BICAP);  Surgeon: SLadene Artist MD;  [facility];  Service: Endoscopy;  Laterality: N/A;  . NASAL SEPTUM SURGERY  1980's  . RENAL ANGIOGRAM Bilateral 11/27/2011   Procedure: RENAL ANGIOGRAM;  Surgeon: MSherren Mocha MD;  [facility];  Service: Cardiovascular;  Laterality: Bilateral;  possible PTA/stent  . RENAL ARTERY STENT  11/27/11    Current Medications: Current Meds  Medication Sig  . amLODipine (NORVASC) 5 MG tablet Take 5 mg by mouth daily.  . carvedilol (COREG) 12.5 MG tablet Take 12.5 mg by mouth 2 (two) times daily with  a meal.   . cholecalciferol (VITAMIN D) 1000 units tablet Take 1,000 mg by mouth daily.   . famotidine (PEPCID) 40 MG tablet Take 40 mg by mouth daily.  . furosemide (LASIX) 20 MG tablet Take 2 tablets (40 mg total) by mouth daily.  . IRON, FERROUS SULFATE, PO Take 65 mg by mouth daily.  [name] loperamide (IMODIUM) 2 MG capsule Take 1 capsule (2 mg total) by mouth as needed for diarrhea or loose stools.  . LUTEIN-ZEAXANTHIN PO Take by mouth.  . rosuvastatin (CRESTOR) 40 MG  tablet Take 40 mg by mouth daily.  . tamsulosin (FLOMAX) 0.4 MG CAPS capsule Take 0.4 mg by mouth.     Allergies:   Patient has no known allergies.   Social History   Socioeconomic History  . Marital status: Married    Spouse name: Not on file  . Number of children: Not on file  . Years of education: Not on file  . Highest education level: Not on file  Occupational History  . Not on file  Tobacco Use  . Smoking status: Current Every Day Smoker    Years: 65.00    Types: Pipe, Cigarettes    Last attempt to quit: 08/19/1996    Years since quitting: 23.1  . Smokeless tobacco: Never Used  . Tobacco comment: 11/27/11 "smoked pipe since 1998; quit cigarettes 1998"  Substance and Sexual Activity  . Alcohol use: Yes    Comment: 03/13/18 2-3 Oz daily   . Drug use: No  . Sexual activity: Never  Other Topics Concern  . Not on file  Social History Narrative  . Not on file   Social Determinants of Health   Financial Resource Strain:   . Difficulty of Paying Living Expenses: Not on file  Food Insecurity:   . Worried About Charity fundraiser in the Last Year: Not on file  . Ran Out of Food in the Last Year: Not on file  Transportation Needs:   . Lack of Transportation (Medical): Not on file  . Lack of Transportation (Non-Medical): Not on file  Physical Activity:   . Days of Exercise per Week: Not on file  . Minutes of Exercise per Session: Not on file  Stress:   . Feeling of Stress : Not on file  Social Connections:   . Frequency of Communication with Friends and Family: Not on file  . Frequency of Social Gatherings with Friends and Family: Not on file  . Attends Religious Services: Not on file  . Active Member of Clubs or Organizations: Not on file  . Attends Archivist Meetings: Not on file  . Marital Status: Not on file     Family History: The patient's family history includes Hypertension in his brother and mother.  ROS:   Please see the history of present  illness.    All other systems reviewed and are negative.  EKGs/Labs/Other Studies Reviewed:    The following studies were reviewed today: Echo: Normal LV function, large pericardial effusion without clear signs of tamponade. Formal interpretation pending.  EKG:  EKG is not ordered today.  Recent Labs: 10/04/2019: ALT 21; BUN 31; Creatinine, Ser 1.57; Hemoglobin 11.2; NT-Pro BNP 1,343; Platelets 230; Potassium 4.1; Sodium 143  Recent Lipid Panel No results found for: CHOL, TRIG, HDL, CHOLHDL, VLDL, LDLCALC, LDLDIRECT  Physical Exam:    VS:  BP 136/64   Pulse 68   Ht _0  (1.753 m)   Wt 138 lb (62.6 kg)  SpO2 98%   BMI 20.38 kg/m     Wt Readings from Last 3 Encounters:  10/11/19 138 lb (62.6 kg)  10/01/19 144 lb (65.3 kg)  06/07/19 135 lb (61.2 kg)     GEN: elderly male in no acute distress HEENT: Normal NECK: Mild JVD; No carotid bruits LYMPHATICS: No lymphadenopathy CARDIAC: RRR, no murmurs, rubs, gallops. distant heart sounds.  RESPIRATORY:  Clear to auscultation without rales, wheezing or rhonchi  ABDOMEN: Soft, non-tender, non-distended MUSCULOSKELETAL:  1+ edema right ankle, none on the left; No deformity  SKIN: Warm and dry NEUROLOGIC:  Alert and oriented x 3 PSYCHIATRIC:  Normal affect   ASSESSMENT:    1. Pericardial effusion    PLAN:    In order of problems listed above:  1. I reviewed the patient's echo study and demonstrated the images with the patient and his daughter today.  He does have a large pericardial effusion without major change from his study in October.  However, compared to the 2019 echo, his most recent studies have shown significant increase in the size of the pericardial effusion.  I do not appreciate any obvious echo signs of tamponade.  Clinically, the patient has had a change in his symptoms, now with evidence of acute diastolic heart failure requiring titration of his diuretics.  He has had increased edema and fatigue as well as an  elevated BNP.  I think with his change in symptoms, it is reasonable to proceed with pericardiocentesis.  We talked about treatment options at length today, including ongoing observation and diuretic therapy, needle pericardiocentesis, or subxiphoid pericardial window.  I think it is reasonable to consider needle pericardiocentesis and reserve a surgical window for recurrent effusion.  I reviewed the risks, indications, and alternatives to pericardiocentesis with the patient at length.  I quoted him a 1 to 2% chance of a major complication such as cardiac injury, arrhythmia, hypotension, or MI.  I have recommended a noncontrast CT scan of the chest before proceeding.  This will evaluate for any lung pathology in this elderly, longtime smoker.  This will also further evaluate the characteristics of his chronic pericardial effusion.  I have also recommended a right heart catheterization at the time of pericardiocentesis to evaluate his hemodynamics.  All of their questions are answered today.  He will be scheduled for a noncontrasted chest CT and pericardiocentesis.  He will continue his current medical program for now.   Medication Adjustments/Labs and Tests Ordered: Current medicines are reviewed at length with the patient today.  Concerns regarding medicines are outlined above.  Orders Placed This Encounter  Procedures  . CT Chest Wo Contrast  . Basic metabolic panel  . CBC with Differential/Platelet   No orders of the defined types were placed in this encounter.   Patient Instructions  COVID SCREENING INFORMATION (2/24): You are scheduled for your COVID screening on Wednesday, February 24 at 10:45AM. [facility] ([facility]) [address] Stay in the RIGHT lane and proceed under the brick awning (NOT the tent) and tell them you are there for pre-procedure testing Do NOT bring any pets with you to the testing site  CATHETERIZATION/PERICARDIOCENTESIS INSTRUCTIONS  (2/25): You are scheduled for a Cardiac Catheterization and pericardiocentesis on Thursday, February 25 with Dr. Sherren Mocha.  1. Please arrive at the [facility] (Main Entrance A) at [facility]: [address] [place], [place] [address] at 5:30AM. (This time is two hours before your procedure to ensure your  preparation). Free valet parking service is available. You are allowed ONE visitor in the waiting room during your procedure. Both you and your visitor must wear masks. Special note: Every effort is made to have your procedure done on time. Please understand that emergencies sometimes delay scheduled procedures.  2. Diet: Do not eat or drink anything after midnight except your medications with sips or water.  3. Labs: TODAY: BMET, CBC  4. Medication instructions in preparation for your procedure:  1) HOLD LASIX the morning of your cath  2) You may take your other medications as directed   5. Plan for one night stay--bring personal belongings. 6. Bring a current list of your medications and current insurance cards. 7. You MUST have a responsible person to drive you home. 8. Someone MUST be with you the first 24 hours after you arrive home or your discharge will be delayed. 9. Please wear clothes that are easy to get on and off and wear slip-on shoes.  Thank you for allowing [place] to care for you!   -- [place] [facility]  FOLLOW-UP APPOINTMENT (3/9): You are scheduled with Richardson Dopp on 3/9 at 2:15PM.    Signed, Sherren Mocha, MD  10/11/2019 11:30 AM    [place]

## 2019-10-12 LAB — BASIC METABOLIC PANEL
BUN/Creatinine Ratio: 19 (ref 10–24)
BUN: 30 mg/dL — ABNORMAL HIGH (ref 8–27)
CO2: 24 mmol/L (ref 20–29)
Calcium: 9.1 mg/dL (ref 8.6–10.2)
Chloride: 103 mmol/L (ref 96–106)
Creatinine, Ser: 1.58 mg/dL — ABNORMAL HIGH (ref 0.76–1.27)
GFR calc Af Amer: 45 mL/min/{1.73_m2} — ABNORMAL LOW (ref >59)
GFR calc non Af Amer: 39 mL/min/{1.73_m2} — ABNORMAL LOW (ref >59)
Glucose: 117 mg/dL — ABNORMAL HIGH (ref 65–99)
Potassium: 3.8 mmol/L (ref 3.5–5.2)
Sodium: 144 mmol/L (ref 134–144)

## 2019-10-12 LAB — CBC WITH DIFFERENTIAL/PLATELET
Basophils Absolute: 0 10*3/uL (ref 0.0–0.2)
Basos: 0 %
EOS (ABSOLUTE): 0 10*3/uL (ref 0.0–0.4)
Eos: 0 %
Hematocrit: 36.5 % — ABNORMAL LOW (ref 37.5–51.0)
Hemoglobin: 11.8 g/dL — ABNORMAL LOW (ref 13.0–17.7)
Lymphocytes Absolute: 1.8 10*3/uL (ref 0.7–3.1)
Lymphs: 20 %
MCH: 26.5 pg — ABNORMAL LOW (ref 26.6–33.0)
MCHC: 32.3 g/dL (ref 31.5–35.7)
MCV: 82 fL (ref 79–97)
Monocytes Absolute: 1.1 10*3/uL — ABNORMAL HIGH (ref 0.1–0.9)
Monocytes: 12 %
Neutrophils Absolute: 6.1 10*3/uL (ref 1.4–7.0)
Neutrophils: 68 %
Platelets: 244 10*3/uL (ref 150–450)
RBC: 4.46 x10E6/uL (ref 4.14–5.80)
RDW: 14.4 % (ref 11.6–15.4)
WBC: 8.9 10*3/uL (ref 3.4–10.8)

## 2019-10-13 ENCOUNTER — Other Ambulatory Visit (HOSPITAL_COMMUNITY): Payer: MEDICARE

## 2019-10-14 MED ORDER — FUROSEMIDE 40 MG PO TABS: 40.0000 mg | ORAL_TABLET | Freq: Every day | ORAL | 3 refills | Status: DC

## 2019-10-14 NOTE — Telephone Encounter (Signed)
Confirmed with Dr. Burt Knack the patient will not need hydration prior to procedures 3/10. Confirmed with patient labs/COVID screen will be 3/8. He understands he will need to arrive at [facility] by 0930 on 3/10. Reviewed instructions given at visit again. He was grateful for assistance.

## 2019-10-22 ENCOUNTER — Telehealth: Payer: Self-pay | Admitting: Cardiovascular Disease

## 2019-10-22 ENCOUNTER — Other Ambulatory Visit: Payer: Self-pay

## 2019-10-22 ENCOUNTER — Ambulatory Visit
Admission: RE | Admit: 2019-10-22 | Discharge: 2019-10-22 | Disposition: A | Discharge status: Home / Self Care | Payer: MEDICARE | Source: Ambulatory Visit | Attending: Cardiovascular Disease | Admitting: Cardiovascular Disease

## 2019-10-22 DIAGNOSIS — I313 Pericardial effusion (noninflammatory): Secondary | ICD-10-CM

## 2019-10-22 DIAGNOSIS — I3139 Other pericardial effusion (noninflammatory): Secondary | ICD-10-CM

## 2019-10-22 NOTE — Telephone Encounter (Signed)
Tracey from [facility] calling with CT results.

## 2019-10-22 NOTE — Telephone Encounter (Signed)
I spoke to [place] from [facility] in regards to Chest CT results.  They want Dr Burt Knack to review sections 1,4,5.

## 2019-10-25 ENCOUNTER — Other Ambulatory Visit: Payer: Self-pay

## 2019-10-25 ENCOUNTER — Other Ambulatory Visit: Payer: MEDICARE | Admitting: *Deleted

## 2019-10-25 ENCOUNTER — Other Ambulatory Visit (HOSPITAL_COMMUNITY)
Admission: RE | Admit: 2019-10-25 | Discharge: 2019-10-25 | Disposition: A | Discharge status: Home / Self Care | Payer: MEDICARE | Source: Ambulatory Visit | Attending: Cardiovascular Disease | Admitting: Cardiovascular Disease

## 2019-10-25 DIAGNOSIS — R0602 Shortness of breath: Secondary | ICD-10-CM | POA: Diagnosis not present

## 2019-10-25 DIAGNOSIS — I313 Pericardial effusion (noninflammatory): Secondary | ICD-10-CM | POA: Diagnosis not present

## 2019-10-25 DIAGNOSIS — Z20822 Contact with and (suspected) exposure to covid-19: Secondary | ICD-10-CM | POA: Diagnosis not present

## 2019-10-25 DIAGNOSIS — Z01812 Encounter for preprocedural laboratory examination: Secondary | ICD-10-CM | POA: Insufficient documentation

## 2019-10-25 DIAGNOSIS — I3139 Other pericardial effusion (noninflammatory): Secondary | ICD-10-CM

## 2019-10-25 LAB — CBC WITH DIFFERENTIAL/PLATELET
Basophils Absolute: 0 10*3/uL (ref 0.0–0.2)
Basos: 0 %
EOS (ABSOLUTE): 0 10*3/uL (ref 0.0–0.4)
Eos: 1 %
Hematocrit: 37.6 % (ref 37.5–51.0)
Hemoglobin: 12 g/dL — ABNORMAL LOW (ref 13.0–17.7)
Immature Grans (Abs): 0.2 10*3/uL — ABNORMAL HIGH (ref 0.0–0.1)
Immature Granulocytes: 3 %
Lymphocytes Absolute: 1.5 10*3/uL (ref 0.7–3.1)
Lymphs: 25 %
MCH: 26.5 pg — ABNORMAL LOW (ref 26.6–33.0)
MCHC: 31.9 g/dL (ref 31.5–35.7)
MCV: 83 fL (ref 79–97)
Monocytes Absolute: 1 10*3/uL — ABNORMAL HIGH (ref 0.1–0.9)
Monocytes: 16 %
Neutrophils Absolute: 3.5 10*3/uL (ref 1.4–7.0)
Neutrophils: 55 %
Platelets: 177 10*3/uL (ref 150–450)
RBC: 4.52 x10E6/uL (ref 4.14–5.80)
RDW: 14.4 % (ref 11.6–15.4)
WBC: 6.2 10*3/uL (ref 3.4–10.8)

## 2019-10-25 LAB — BASIC METABOLIC PANEL
BUN/Creatinine Ratio: 23 (ref 10–24)
BUN: 34 mg/dL — ABNORMAL HIGH (ref 8–27)
CO2: 24 mmol/L (ref 20–29)
Calcium: 9.1 mg/dL (ref 8.6–10.2)
Chloride: 105 mmol/L (ref 96–106)
Creatinine, Ser: 1.49 mg/dL — ABNORMAL HIGH (ref 0.76–1.27)
GFR calc Af Amer: 48 mL/min/{1.73_m2} — ABNORMAL LOW (ref >59)
GFR calc non Af Amer: 42 mL/min/{1.73_m2} — ABNORMAL LOW (ref >59)
Glucose: 113 mg/dL — ABNORMAL HIGH (ref 65–99)
Potassium: 3.8 mmol/L (ref 3.5–5.2)
Sodium: 144 mmol/L (ref 134–144)

## 2019-10-26 ENCOUNTER — Telehealth: Payer: Self-pay | Admitting: *Deleted

## 2019-10-26 ENCOUNTER — Ambulatory Visit: Payer: MEDICARE | Admitting: Physician Assistant

## 2019-10-26 LAB — SARS CORONAVIRUS 2 (TAT 6-24 HRS): SARS Coronavirus 2: NEGATIVE

## 2019-10-26 NOTE — Telephone Encounter (Addendum)
Pt contacted pre-catheterization scheduled at [facility] for: Wednesday October 27, 2019 11:30 AM Verified arrival time and place: [facility]) at: 9:30AM   No solid food after midnight prior to cath, clear liquids until 5 AM day of procedure.  Hold: Lasix-AM of procedure AM meds can be taken pre-cath with sip of water.   Confirmed patient has responsible adult to drive home post procedure and observe 24 hours after arriving home: yes  Currently, due to Covid-19 pandemic, only one person will be allowed with patient. Must be the same person for patient's entire stay and will be required to wear a mask. They will be asked to wait in the waiting room for the duration of the patient's stay.  Patients are required to wear a mask when they enter the hospital.      COVID-19 Pre-Screening Questions:  . In the past 7 to 10 days have you had a cough,  shortness of breath, headache, congestion, fever (100 or greater) body aches, chills, sore throat, or sudden loss of taste or sense of smell? Bursitis in left hip and back-pt states painful to take steps, can lie flat . Have you been around anyone with known Covid 19 in the past 7-10 days? no . Have you been around anyone who is awaiting Covid 19 test results in the past 7 to 10 days? no . Have you been around anyone who has been exposed to Covid 19, or has mentioned symptoms of Covid 19 within the past 7 to 10 days? No  I reviewed procedure/mask/visitor instructions, COVID-19 screening questions with patient, he verbalized understanding, thanked me for call.  Per Dr Tim Lair hydration needed-no contrast.

## 2019-10-27 ENCOUNTER — Encounter (HOSPITAL_COMMUNITY)
Admission: RE | Disposition: A | Discharge status: Home / Self Care | Payer: Self-pay | Source: Home / Self Care | Attending: Cardiovascular Disease

## 2019-10-27 ENCOUNTER — Ambulatory Visit (HOSPITAL_COMMUNITY)
Admission: RE | Admit: 2019-10-27 | Discharge: 2019-10-27 | Disposition: A | Discharge status: Home / Self Care | Payer: MEDICARE | Attending: Cardiovascular Disease | Admitting: Cardiovascular Disease

## 2019-10-27 ENCOUNTER — Other Ambulatory Visit: Payer: Self-pay

## 2019-10-27 ENCOUNTER — Ambulatory Visit (HOSPITAL_BASED_OUTPATIENT_CLINIC_OR_DEPARTMENT_OTHER): Payer: MEDICARE

## 2019-10-27 ENCOUNTER — Other Ambulatory Visit: Payer: MEDICARE

## 2019-10-27 DIAGNOSIS — Z79899 Other long term (current) drug therapy: Secondary | ICD-10-CM | POA: Insufficient documentation

## 2019-10-27 DIAGNOSIS — I313 Pericardial effusion (noninflammatory): Secondary | ICD-10-CM

## 2019-10-27 DIAGNOSIS — I714 Abdominal aortic aneurysm, without rupture: Secondary | ICD-10-CM | POA: Diagnosis not present

## 2019-10-27 DIAGNOSIS — I3139 Other pericardial effusion (noninflammatory): Secondary | ICD-10-CM | POA: Diagnosis present

## 2019-10-27 DIAGNOSIS — Z8249 Family history of ischemic heart disease and other diseases of the circulatory system: Secondary | ICD-10-CM | POA: Diagnosis not present

## 2019-10-27 DIAGNOSIS — I130 Hypertensive heart and chronic kidney disease with heart failure and stage 1 through stage 4 chronic kidney disease, or unspecified chronic kidney disease: Secondary | ICD-10-CM | POA: Diagnosis not present

## 2019-10-27 DIAGNOSIS — F17210 Nicotine dependence, cigarettes, uncomplicated: Secondary | ICD-10-CM | POA: Insufficient documentation

## 2019-10-27 DIAGNOSIS — N183 Chronic kidney disease, stage 3 unspecified: Secondary | ICD-10-CM | POA: Insufficient documentation

## 2019-10-27 DIAGNOSIS — I701 Atherosclerosis of renal artery: Secondary | ICD-10-CM | POA: Diagnosis not present

## 2019-10-27 DIAGNOSIS — E7800 Pure hypercholesterolemia, unspecified: Secondary | ICD-10-CM | POA: Insufficient documentation

## 2019-10-27 DIAGNOSIS — E1122 Type 2 diabetes mellitus with diabetic chronic kidney disease: Secondary | ICD-10-CM | POA: Insufficient documentation

## 2019-10-27 DIAGNOSIS — R188 Other ascites: Secondary | ICD-10-CM | POA: Diagnosis not present

## 2019-10-27 HISTORY — PX: PERICARDIOCENTESIS: CATH118255

## 2019-10-27 HISTORY — PX: RIGHT HEART CATH: CATH118263

## 2019-10-27 LAB — BODY FLUID CELL COUNT WITH DIFFERENTIAL
Lymphs, Fluid: 85 %
Monocyte-Macrophage-Serous Fluid: 15 % — ABNORMAL LOW (ref 50–90)
Total Nucleated Cell Count, Fluid: 580 cu mm (ref 0–1000)

## 2019-10-27 LAB — POCT I-STAT EG7
Bicarbonate: 25.3 mmol/L (ref 20.0–28.0)
Calcium, Ion: 1.04 mmol/L — ABNORMAL LOW (ref 1.15–1.40)
HCT: 30 % — ABNORMAL LOW (ref 39.0–52.0)
Hemoglobin: 10.2 g/dL — ABNORMAL LOW (ref 13.0–17.0)
O2 Saturation: 66 %
Potassium: 3 mmol/L — ABNORMAL LOW (ref 3.5–5.1)
Sodium: 149 mmol/L — ABNORMAL HIGH (ref 135–145)
TCO2: 27 mmol/L (ref 22–32)
pCO2, Ven: 43.7 mmHg — ABNORMAL LOW (ref 44.0–60.0)
pH, Ven: 7.371 (ref 7.250–7.430)
pO2, Ven: 35 mmHg (ref 32.0–45.0)

## 2019-10-27 LAB — GRAM STAIN

## 2019-10-27 LAB — GLUCOSE, CAPILLARY: Glucose-Capillary: 124 mg/dL — ABNORMAL HIGH (ref 70–99)

## 2019-10-27 LAB — ECHOCARDIOGRAM LIMITED
Height: 69 in
Weight: 2208 oz

## 2019-10-27 SURGERY — RIGHT HEART CATH

## 2019-10-27 MED ORDER — ONDANSETRON HCL 4 MG/2ML IJ SOLN: 4.0000 mg | Freq: Four times a day (QID) | INTRAMUSCULAR | Status: DC | PRN

## 2019-10-27 MED ORDER — ACETAMINOPHEN 325 MG PO TABS: 650.0000 mg | ORAL_TABLET | ORAL | Status: DC | PRN

## 2019-10-27 MED ORDER — SODIUM CHLORIDE 0.9 % WEIGHT BASED INFUSION: 3.0000 mL/kg/h | INTRAVENOUS | Status: DC

## 2019-10-27 MED ORDER — OXYCODONE HCL 5 MG PO TABS: 5.0000 mg | ORAL_TABLET | ORAL | Status: DC | PRN

## 2019-10-27 MED ORDER — HEPARIN (PORCINE) IN NACL 1000-0.9 UT/500ML-% IV SOLN
INTRAVENOUS | Status: AC
  Filled 2019-10-27: qty 1000

## 2019-10-27 MED ORDER — SODIUM CHLORIDE 0.9 % IV SOLN: 250.0000 mL | INTRAVENOUS | Status: DC | PRN

## 2019-10-27 MED ORDER — SODIUM CHLORIDE 0.9% FLUSH: 3.0000 mL | Freq: Two times a day (BID) | INTRAVENOUS | Status: DC

## 2019-10-27 MED ORDER — MORPHINE SULFATE (PF) 2 MG/ML IV SOLN: 2.0000 mg | INTRAVENOUS | Status: DC | PRN

## 2019-10-27 MED ORDER — FENTANYL CITRATE (PF) 100 MCG/2ML IJ SOLN
INTRAMUSCULAR | Status: DC | PRN
  Administered 2019-10-27 (×2): 25 ug via INTRAVENOUS

## 2019-10-27 MED ORDER — FENTANYL CITRATE (PF) 100 MCG/2ML IJ SOLN
INTRAMUSCULAR | Status: AC
  Filled 2019-10-27: qty 2

## 2019-10-27 MED ORDER — SODIUM CHLORIDE 0.9 % WEIGHT BASED INFUSION: 1.0000 mL/kg/h | INTRAVENOUS | Status: DC

## 2019-10-27 MED ORDER — SODIUM CHLORIDE 0.9% FLUSH: 3.0000 mL | INTRAVENOUS | Status: DC | PRN

## 2019-10-27 MED ORDER — LIDOCAINE HCL (PF) 1 % IJ SOLN
INTRAMUSCULAR | Status: AC
  Filled 2019-10-27: qty 30

## 2019-10-27 MED ORDER — LIDOCAINE HCL (PF) 1 % IJ SOLN
INTRAMUSCULAR | Status: DC | PRN
  Administered 2019-10-27: 10 mL

## 2019-10-27 MED ORDER — MIDAZOLAM HCL 2 MG/2ML IJ SOLN
INTRAMUSCULAR | Status: DC | PRN
  Administered 2019-10-27 (×3): 1 mg via INTRAVENOUS

## 2019-10-27 MED ORDER — MIDAZOLAM HCL 2 MG/2ML IJ SOLN
INTRAMUSCULAR | Status: AC
  Filled 2019-10-27: qty 2

## 2019-10-27 MED ORDER — HEPARIN (PORCINE) IN NACL 1000-0.9 UT/500ML-% IV SOLN
INTRAVENOUS | Status: DC | PRN
  Administered 2019-10-27: 500 mL

## 2019-10-27 MED ORDER — SODIUM CHLORIDE 0.9 % IV SOLN: INTRAVENOUS | Status: DC

## 2019-10-27 MED ORDER — HYDRALAZINE HCL 20 MG/ML IJ SOLN: 10.0000 mg | INTRAMUSCULAR | Status: DC | PRN

## 2019-10-27 MED ORDER — LABETALOL HCL 5 MG/ML IV SOLN: 10.0000 mg | INTRAVENOUS | Status: DC | PRN

## 2019-10-27 SURGICAL SUPPLY — 7 items
CATH BALLN WEDGE 5F 110CM (CATHETERS) ×2 IMPLANT
GUIDEWIRE .025 260CM (WIRE) ×2 IMPLANT
PERIVAC PERICARDIOCENTESIS 8.3 (TRAY / TRAY PROCEDURE) ×2 IMPLANT
SHEATH GLIDE SLENDER 4/5FR (SHEATH) ×2 IMPLANT
TRANSDUCER W/MONITORING KIT (MISCELLANEOUS) ×2 IMPLANT
TUBING ART PRESS 72  MALE/FEM (TUBING) ×3
TUBING ART PRESS 72 MALE/FEM (TUBING) IMPLANT

## 2019-10-27 NOTE — Interval H&P Note (Signed)
History and Physical Interval Note:  10/27/2019 12:05 PM  Chris Peterson  has presented today for surgery, with the diagnosis of Pericardial effusion.  The various methods of treatment have been discussed with the patient and family. After consideration of risks, benefits and other options for treatment, the patient has consented to  Procedure(s): RIGHT HEART CATH (N/A) PERICARDIOCENTESIS (N/A) as a surgical intervention.  The patient's history has been reviewed, patient examined, no change in status, stable for surgery.  I have reviewed the patient's chart and labs.  Questions were answered to the patient's satisfaction.     Sherren Mocha

## 2019-10-27 NOTE — Progress Notes (Signed)
  Echocardiogram 2D Echocardiogram has been performed.  Darlina Sicilian M 10/27/2019, 1:41 PM

## 2019-10-27 NOTE — Discharge Instructions (Signed)
Pericardiocentesis, Care After This sheet gives you information about how to care for yourself after your procedure. Your health care provider may also give you more specific instructions. If you have problems or questions, contact your health care provider. What can I expect after the procedure? After the procedure, it is common to have:  Soreness at the puncture site. Follow these instructions at home: Puncture site care   Follow instructions from your health care provider about how to take care of your puncture site. Make sure you: ? Wash your hands with soap and water before you change your bandage (dressing). If soap and water are not available, use hand sanitizer. ? Change your dressing as told by your health care provider.  Check your puncture area every day for signs of infection. Check for: ? Redness, swelling, or pain. ? Fluid or blood. ? Warmth. ? Pus or a bad smell.  Do not take baths, swim, or use a hot tub until your health care provider approves. Ask your health care provider when you can start taking showers. General instructions  Take over-the-counter and prescription medicines only as told by your health care provider.  Ask your health care provider whether you can taken medicines such as aspirin and ibuprofen. These medicines can cause bleeding after a procedure.  Do not use any products that contain nicotine or tobacco, such as cigarettes and e-cigarettes. If you need help quitting, ask your health care provider.  Rest at home and avoid vigorous activity for a few days. Return to your normal activities as directed by your health care provider.  Keep all follow-up visits as told by your health care provider. This is important. Contact a health care provider if you:  Have a fever.  Have redness, swelling, or pain at the site of your puncture.  Have fluid, blood, or pus coming from your puncture site.  Have a cough. Get help right away if:  You have chest  pain.  You have trouble breathing.  Your heart pounds or feels like it is skipping beats (palpitations).  You feel dizzy or light-headed. These symptoms may represent a serious problem that is an emergency. Do not wait to see if the symptoms will go away. Get medical help right away. Call your local emergency services (911 in the [place] Do not drive yourself to the hospital. Summary  After your procedure, it is common to have soreness at the site where the needle was inserted.  Follow instructions from your health care provider about how to take care of your puncture site.  Take over-the-counter and prescription medicines only as told by your health care provider. You may be asked to avoid taking medicines such as aspirin and ibuprofen. These can cause bleeding after a procedure.  Get help right away if you have palpitations, lightheadedness, chest pain, or trouble breathing. This information is not intended to replace advice given to you by your health care provider. Make sure you discuss any questions you have with your health care provider. Document Revised: 10/01/2018 Document Reviewed: 09/11/2017 Elsevier Patient Education  2020 [employer].

## 2019-10-28 ENCOUNTER — Telehealth: Payer: Self-pay | Admitting: Cardiovascular Disease

## 2019-10-28 ENCOUNTER — Other Ambulatory Visit: Payer: Self-pay

## 2019-10-28 DIAGNOSIS — N289 Disorder of kidney and ureter, unspecified: Secondary | ICD-10-CM

## 2019-10-28 DIAGNOSIS — I714 Abdominal aortic aneurysm, without rupture, unspecified: Secondary | ICD-10-CM

## 2019-10-28 DIAGNOSIS — I3139 Other pericardial effusion (noninflammatory): Secondary | ICD-10-CM

## 2019-10-28 DIAGNOSIS — I313 Pericardial effusion (noninflammatory): Secondary | ICD-10-CM

## 2019-10-28 LAB — GLUCOSE, BODY FLUID OTHER: Glucose, Body Fluid Other: 124 mg/dL

## 2019-10-28 LAB — PH, BODY FLUID: pH, Body Fluid: 7.8

## 2019-10-28 LAB — LD, BODY FLUID (OTHER): LD, Body Fluid: 262 IU/L

## 2019-10-28 LAB — PROTEIN, BODY FLUID (OTHER): Total Protein, Body Fluid Other: 4.9 g/dL

## 2019-10-28 LAB — CYTOLOGY - NON PAP

## 2019-10-28 MED FILL — Heparin Sod (Porcine)-NaCl IV Soln 1000 Unit/500ML-0.9%: INTRAVENOUS | Qty: 500 | Status: AC

## 2019-10-28 NOTE — Telephone Encounter (Signed)
Patient's daughter, [name], states she is calling to inquire about when the bandages from procedure performed on 10/27/19 may be removed. She states she would also like to inquire about when the patient may take a shower. Please advise.

## 2019-10-28 NOTE — Telephone Encounter (Signed)
Renal US and AAA duplex scheduled 4/1 at [facility]. Patient's daughter understands to have him arrive at 1000. NPO after midnight. She was grateful for assistance.

## 2019-10-28 NOTE — Addendum Note (Signed)
Addended by: Harland German A on: 10/28/2019 05:25 PM   Modules accepted: Orders

## 2019-10-28 NOTE — Telephone Encounter (Signed)
Spoke with patient's daughter and the patient. Informed them it is OK to shower. He understands to be careful to keep the puncture dry/covered until healed when he is clothed.   Per Dr. Antionette Char pericardiocentesis op note, the patient will need echo in 3 weeks.  Scheduled him for echo and OV 4/2.  Reviewed CT results with patient who verbalized understanding.   Renal US ordered for scheduling. The patient agrees with treatment plan.

## 2019-10-28 NOTE — Telephone Encounter (Signed)
-----   Message from Sherren Mocha, MD sent at 10/26/2019  6:53 PM EST ----- Let's leave the duplex on the schedule in April and keep following him with [place] studies. Thx Valetta Fuller

## 2019-11-01 DIAGNOSIS — N183 Chronic kidney disease, stage 3 unspecified: Secondary | ICD-10-CM | POA: Diagnosis not present

## 2019-11-01 DIAGNOSIS — R531 Weakness: Secondary | ICD-10-CM | POA: Diagnosis not present

## 2019-11-01 DIAGNOSIS — M5430 Sciatica, unspecified side: Secondary | ICD-10-CM | POA: Diagnosis not present

## 2019-11-01 LAB — CULTURE, BODY FLUID W GRAM STAIN -BOTTLE: Culture: NO GROWTH

## 2019-11-09 DIAGNOSIS — I701 Atherosclerosis of renal artery: Secondary | ICD-10-CM | POA: Diagnosis not present

## 2019-11-09 DIAGNOSIS — N2581 Secondary hyperparathyroidism of renal origin: Secondary | ICD-10-CM | POA: Diagnosis not present

## 2019-11-09 DIAGNOSIS — N189 Chronic kidney disease, unspecified: Secondary | ICD-10-CM | POA: Diagnosis not present

## 2019-11-09 DIAGNOSIS — I313 Pericardial effusion (noninflammatory): Secondary | ICD-10-CM | POA: Diagnosis not present

## 2019-11-09 DIAGNOSIS — N390 Urinary tract infection, site not specified: Secondary | ICD-10-CM | POA: Diagnosis not present

## 2019-11-09 DIAGNOSIS — E1122 Type 2 diabetes mellitus with diabetic chronic kidney disease: Secondary | ICD-10-CM | POA: Diagnosis not present

## 2019-11-09 DIAGNOSIS — I739 Peripheral vascular disease, unspecified: Secondary | ICD-10-CM | POA: Diagnosis not present

## 2019-11-09 DIAGNOSIS — N400 Enlarged prostate without lower urinary tract symptoms: Secondary | ICD-10-CM | POA: Diagnosis not present

## 2019-11-09 DIAGNOSIS — N183 Chronic kidney disease, stage 3 unspecified: Secondary | ICD-10-CM | POA: Diagnosis not present

## 2019-11-09 DIAGNOSIS — K219 Gastro-esophageal reflux disease without esophagitis: Secondary | ICD-10-CM | POA: Diagnosis not present

## 2019-11-09 DIAGNOSIS — D631 Anemia in chronic kidney disease: Secondary | ICD-10-CM | POA: Diagnosis not present

## 2019-11-09 DIAGNOSIS — I129 Hypertensive chronic kidney disease with stage 1 through stage 4 chronic kidney disease, or unspecified chronic kidney disease: Secondary | ICD-10-CM | POA: Diagnosis not present

## 2019-11-09 DIAGNOSIS — E785 Hyperlipidemia, unspecified: Secondary | ICD-10-CM | POA: Diagnosis not present

## 2019-11-16 DIAGNOSIS — N400 Enlarged prostate without lower urinary tract symptoms: Secondary | ICD-10-CM | POA: Diagnosis not present

## 2019-11-16 DIAGNOSIS — M5441 Lumbago with sciatica, right side: Secondary | ICD-10-CM | POA: Diagnosis not present

## 2019-11-16 DIAGNOSIS — I129 Hypertensive chronic kidney disease with stage 1 through stage 4 chronic kidney disease, or unspecified chronic kidney disease: Secondary | ICD-10-CM | POA: Diagnosis not present

## 2019-11-16 DIAGNOSIS — M549 Dorsalgia, unspecified: Secondary | ICD-10-CM | POA: Diagnosis not present

## 2019-11-16 DIAGNOSIS — N183 Chronic kidney disease, stage 3 unspecified: Secondary | ICD-10-CM | POA: Diagnosis not present

## 2019-11-16 DIAGNOSIS — E782 Mixed hyperlipidemia: Secondary | ICD-10-CM | POA: Diagnosis not present

## 2019-11-16 DIAGNOSIS — F17210 Nicotine dependence, cigarettes, uncomplicated: Secondary | ICD-10-CM | POA: Diagnosis not present

## 2019-11-16 DIAGNOSIS — E1122 Type 2 diabetes mellitus with diabetic chronic kidney disease: Secondary | ICD-10-CM | POA: Diagnosis not present

## 2019-11-16 DIAGNOSIS — M5442 Lumbago with sciatica, left side: Secondary | ICD-10-CM | POA: Diagnosis not present

## 2019-11-16 DIAGNOSIS — I714 Abdominal aortic aneurysm, without rupture: Secondary | ICD-10-CM | POA: Diagnosis not present

## 2019-11-16 DIAGNOSIS — Z87440 Personal history of urinary (tract) infections: Secondary | ICD-10-CM | POA: Diagnosis not present

## 2019-11-16 DIAGNOSIS — Z9181 History of falling: Secondary | ICD-10-CM | POA: Diagnosis not present

## 2019-11-16 DIAGNOSIS — D631 Anemia in chronic kidney disease: Secondary | ICD-10-CM | POA: Diagnosis not present

## 2019-11-16 DIAGNOSIS — I2510 Atherosclerotic heart disease of native coronary artery without angina pectoris: Secondary | ICD-10-CM | POA: Diagnosis not present

## 2019-11-16 DIAGNOSIS — E1169 Type 2 diabetes mellitus with other specified complication: Secondary | ICD-10-CM | POA: Diagnosis not present

## 2019-11-16 DIAGNOSIS — H9190 Unspecified hearing loss, unspecified ear: Secondary | ICD-10-CM | POA: Diagnosis not present

## 2019-11-16 DIAGNOSIS — K219 Gastro-esophageal reflux disease without esophagitis: Secondary | ICD-10-CM | POA: Diagnosis not present

## 2019-11-17 DIAGNOSIS — E782 Mixed hyperlipidemia: Secondary | ICD-10-CM | POA: Diagnosis not present

## 2019-11-17 DIAGNOSIS — N401 Enlarged prostate with lower urinary tract symptoms: Secondary | ICD-10-CM | POA: Diagnosis not present

## 2019-11-17 DIAGNOSIS — N400 Enlarged prostate without lower urinary tract symptoms: Secondary | ICD-10-CM | POA: Diagnosis not present

## 2019-11-17 DIAGNOSIS — I10 Essential (primary) hypertension: Secondary | ICD-10-CM | POA: Diagnosis not present

## 2019-11-17 DIAGNOSIS — D649 Anemia, unspecified: Secondary | ICD-10-CM | POA: Diagnosis not present

## 2019-11-17 DIAGNOSIS — E1169 Type 2 diabetes mellitus with other specified complication: Secondary | ICD-10-CM | POA: Diagnosis not present

## 2019-11-18 ENCOUNTER — Ambulatory Visit (HOSPITAL_COMMUNITY)
Admission: RE | Admit: 2019-11-18 | Discharge: 2019-11-18 | Disposition: A | Discharge status: Home / Self Care | Payer: MEDICARE | Source: Ambulatory Visit | Attending: Cardiovascular Disease | Admitting: Cardiovascular Disease

## 2019-11-18 ENCOUNTER — Other Ambulatory Visit: Payer: Self-pay

## 2019-11-18 DIAGNOSIS — I714 Abdominal aortic aneurysm, without rupture, unspecified: Secondary | ICD-10-CM

## 2019-11-18 DIAGNOSIS — N289 Disorder of kidney and ureter, unspecified: Secondary | ICD-10-CM | POA: Diagnosis not present

## 2019-11-19 ENCOUNTER — Ambulatory Visit (INDEPENDENT_AMBULATORY_CARE_PROVIDER_SITE_OTHER): Payer: MEDICARE | Admitting: Physician Assistant

## 2019-11-19 ENCOUNTER — Encounter: Payer: Self-pay | Admitting: Physician Assistant

## 2019-11-19 ENCOUNTER — Ambulatory Visit (HOSPITAL_COMMUNITY): Payer: MEDICARE | Attending: Internal Medicine

## 2019-11-19 ENCOUNTER — Other Ambulatory Visit: Payer: Self-pay | Admitting: Cardiovascular Disease

## 2019-11-19 VITALS — BP 142/78 | HR 95 | Ht 69.0 in | Wt 138.0 lb

## 2019-11-19 DIAGNOSIS — I3139 Other pericardial effusion (noninflammatory): Secondary | ICD-10-CM

## 2019-11-19 DIAGNOSIS — I714 Abdominal aortic aneurysm, without rupture, unspecified: Secondary | ICD-10-CM

## 2019-11-19 DIAGNOSIS — I480 Paroxysmal atrial fibrillation: Secondary | ICD-10-CM

## 2019-11-19 DIAGNOSIS — N2889 Other specified disorders of kidney and ureter: Secondary | ICD-10-CM | POA: Diagnosis not present

## 2019-11-19 DIAGNOSIS — N1831 Chronic kidney disease, stage 3a: Secondary | ICD-10-CM | POA: Diagnosis not present

## 2019-11-19 DIAGNOSIS — I701 Atherosclerosis of renal artery: Secondary | ICD-10-CM | POA: Diagnosis not present

## 2019-11-19 DIAGNOSIS — I313 Pericardial effusion (noninflammatory): Secondary | ICD-10-CM | POA: Diagnosis not present

## 2019-11-19 DIAGNOSIS — I4891 Unspecified atrial fibrillation: Secondary | ICD-10-CM

## 2019-11-19 HISTORY — DX: Unspecified atrial fibrillation: I48.91

## 2019-11-19 NOTE — Patient Instructions (Addendum)
Medication Instructions:   Your physician recommends that you continue on your current medications as directed. Please refer to the Current Medication list given to you today.  *If you need a refill on your cardiac medications before your next appointment, please call your pharmacy*  Lab Work:  None ordered today  Testing/Procedures:  None ordered today  Follow-Up: At [facility], you and your health needs are our priority.  As part of our continuing mission to provide you with exceptional heart care, we have created designated Provider Care Teams.  These Care Teams include your primary Cardiologist (physician) and Advanced Practice Providers (APPs -  Physician Assistants and Nurse Practitioners) who all work together to provide you with the care you need, when you need it.  We recommend signing up for the patient portal called "MyChart".  Sign up information is provided on this After Visit Summary.  MyChart is used to connect with patients for Virtual Visits (Telemedicine).  Patients are able to view lab/test results, encounter notes, upcoming appointments, etc.  Non-urgent messages can be sent to your provider as well.   To learn more about what you can do with MyChart, go to [email or web address].    Your next appointment:    On 12/13/19 at 2:00PM with Sherren Mocha, MD

## 2019-11-19 NOTE — Progress Notes (Signed)
Cardiology Office Note:    Date:  11/19/2019   ID:  Chris Peterson, DOB Oct 19, 1929, MRN [id number]  PCP:  Chris Cruel, MD  Cardiologist:  Chris Mocha, MD  Electrophysiologist:  None   Referring MD: Chris Cruel, MD   Chief Complaint:  Hospitalization Follow-up (s/p pericardiocentesis )    Patient Profile:    Chris Peterson is a 86 y.o. male with:   Pericardial effusion  Status post pericardiocentesis  Cytology negative for malignant cells  Diastolic CHF  Renal artery stenosis  Status post left renal artery stent 2013  Abdominal aortic aneurysm  Ultrasound 11/18/2019: 5.5 cm  Chronic kidney disease  Left renal mass  Ultrasound 11/18/2019 3.1 cm solid mass left upper pole  Hypertension  Hyperlipidemia   Prior CV studies: AAA [place] 11/18/2019 AAA 5.5 cm  Renal US 11/18/2019 Echogenic kidneys consistent with medical renal disease 3.1 cm mass upper pole left kidney-concerning for solid renal mass Left pleural effusion  Echocardiogram 10/27/2019 EF 60-65, small LV posterior pericardial effusion, no tamponade features  R heart catheterization 10/27/2019 Mean RA 1, RVSP 27, PASP 0, mean PA -8, mean PCWP 10 Pericardiocentesis removing 750 cc of serous fluid  Chest CT 10/22/2019 IMPRESSION: 1. Moderately large pericardial effusion. Larger than in 2018. Recent echocardiogram reports state that this effusion has been increasing in size. 2. Marked aortic calcification throughout the thoracic aorta. 3. Three-vessel coronary artery calcification. 4. Enlargement of the upper abdominal aortic aneurysm, now measuring 4.3 x 4.5 cm as compared to 4.1 x 4.0 cm on the previous exam. Recommend followup by abdomen and pelvis CTA in 6 months or as dictated based on symptoms., and vascular surgery referral/consultation if not already obtained. This recommendation follows ACR consensus guidelines: White Paper of the ACR Incidental Findings Committee II on Vascular  Findings. J Am Coll Radiol 2013; 10:789-794. Aortic aneurysm NOS (ICD10-I71.9) 5. Enlarging lesion arising from the upper pole the left kidney measuring 4.3 x 3.6 cm as compared to 1.9 x 1.8 cm on the previous exam. Density value approximately 30 Hounsfield units. Further evaluation with ultrasound or renal protocol CT of the abdomen is suggested. While this may represent a hemorrhagic cyst with enlargement a solid renal neoplasm is also considered. 6. Centrilobular pulmonary emphysema.  These results will be called to the ordering clinician or representative by the Radiologist Assistant, and communication documented in the PACS or zVision Dashboard.  Aortic Atherosclerosis (ICD10-I70.0) and Emphysema (ICD10-J43.9).   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 Colver was last seen by Dr. Burt Peterson 10/11/2019.  He had recently called in with symptoms of heart failure and progressive weakness, leg swelling and weight gain.  His diuretics were adjusted with some clinical improvement.  Echocardiogram demonstrated worsening pericardial effusion therefore, pericardiocentesis was arranged.  Post procedure echocardiogram demonstrated improved pericardial effusion.  Prior to pericardiocentesis, the patient did have a chest CT.  This did demonstrate enlargement of his abdominal aortic aneurysm.  There was also noted a left renal mass.  Follow-up ultrasound demonstrated enlargement of his abdominal aortic aneurysm to 5.5 cm.  There was also noted a 3.1 cm upper pole mass in the left kidney.  This did not appear cystic and appeared to  be solid.  Of note, no malignant cells were identified on cytology.  He returns for follow up.  He is here with his daughter.  He  had his follow up echocardiogram done this AM.  Since his procedure, he has felt better.  He was diagnosed with a urinary tract infection by his nephrologist.  He has completed the antibiotics.  He has not had any chest pain with exertion or chest pain with lying supine or pleuritic chest pain.  His breathing seems to be normal.  He has not had orthopnea, syncope.  His R ankle is swollen. This is chronic and unchanged.   Past Medical History:  Diagnosis Date  . AAA (abdominal aortic aneurysm) (HCC)    Infrarenal AAA - maximal diameter of 3.5 cm   . Atherosclerotic renal artery stenosis, bilateral (HCC)    Bilateral renal angiography 11/27/11, Right 70% prox, Left 90% prox s/p LRA PTA/Stenting - 6.0x35m ;  12/12/2011 Renal Duplex: Patent LRA stent & > 60% RRA ost stenosis  . Blood transfusion    S/P "lost blood due to hemorrhoids"  . Dysrhythmia    "fast one time"  . Hemorrhoids   . High cholesterol   . HTN (hypertension)   . Kidney disease, chronic, stage III (moderate, EGFR 30-59 ml/min)   . Pericardial effusion    a. incidentally noted on renal u/s 11/2011;  b.  12/16/2011 Echo:  EF 55-60%, Gr1 DD, Mild MR, PASP 32, Mod circumferential peric effusion w/o hemodynamic compromise. Density in pericardial space - consider MRI  . Type II diabetes mellitus (HCC)     Current Medications: Current Meds  Medication Sig  . carvedilol (COREG) 12.5 MG tablet Take 12.5 mg by mouth 2 (two) times daily with a meal.   . cholecalciferol (VITAMIN D) 1000 units tablet Take 1,000 mg by mouth every other day.   . furosemide (LASIX) 40 MG tablet Take 1 tablet (40 mg total) by mouth daily.  .Marland Kitchenlisinopril (ZESTRIL) 10 MG tablet Take 10 mg by mouth daily.  .Marland Kitchenloperamide (IMODIUM) 2 MG capsule Take 1 capsule (2 mg total) by mouth as needed for diarrhea or loose stools.  . rosuvastatin (CRESTOR) 40 MG tablet Take 40 mg by mouth daily.  . tamsulosin (FLOMAX) 0.4 MG CAPS capsule Take 0.4 mg by mouth daily.       Allergies:   Patient has no known allergies.   Social History   Tobacco Use  . Smoking status: Current Every Day Smoker    Years: 65.00    Types: Pipe, Cigarettes    Last attempt to quit: 08/19/1996    Years since quitting: 23.2  . Smokeless tobacco: Never Used  . Tobacco comment: 11/27/11 "smoked pipe since 1998; quit cigarettes 1998"  Substance Use Topics  . Alcohol use: Yes    Comment: 03/13/18 2-3 Oz daily   . Drug use: No     Family Hx: The patient's family history includes Hypertension in his brother and mother.  ROS   EKGs/Labs/Other Test Reviewed:    EKG:  EKG is   ordered today.  The ekg ordered today demonstrates atrial fibrillation, HR 83 low voltage, right bundle branch block, QTC 481  Recent Labs: 10/04/2019: ALT 21; NT-Pro BNP 1,343 10/25/2019: BUN 34; Creatinine, Ser 1.49; Platelets 177 10/27/2019: Hemoglobin 10.2; Potassium 3.0; Sodium 149   Recent Lipid Panel No results found for: CHOL, TRIG, HDL, CHOLHDL, LDLCALC, LDLDIRECT  Physical Exam:    VS:  BP (!) 142/78   Pulse  95   Ht '5\' 9"'$  (1.753 m)   Wt 138 lb (62.6 kg)   SpO2 97%   BMI 20.38 kg/m     Wt Readings from Last 3 Encounters:  11/19/19 138 lb (62.6 kg)  10/27/19 138 lb (62.6 kg)  10/11/19 138 lb (62.6 kg)     Constitutional:      Appearance: Healthy appearance. Not in distress. Frail.  Pulmonary:     Breath sounds: No wheezing. No rales.  Cardiovascular:     Normal rate. Irregularly irregular rhythm. Normal S1. Normal S2.     Murmurs: There is a grade 2/6 early systolic murmur at the LLSB.  Edema:    Ankle: trace edema of the right ankle. Abdominal:     Palpations: Abdomen is soft.  Musculoskeletal:     Cervical back: Neck supple. Skin:    General: Skin is warm and dry.  Neurological:     General: No focal deficit present.     Mental Status: Alert and oriented to person, place and time.      ASSESSMENT & PLAN:    1. Pericardial effusion He is s/p pericardiocentesis.   Cytology was neg.  He is feeling better.  I was able to touch base with the cardiologist reading his echocardiogram today.  This demonstrates an EF of 60-65% and large pericardial effusion.  I reviewed his case today with Dr. Angelena Form (attending MD).  The patient does not seem to be in any distress.  He is not tachycardic or hypotensive.  It did not appear that he had evidence of tamponade on his echocardiogram.  The official read on his echocardiogram today did not show evidence of cardiac tamponade.  It appears that his effusion is larger than his post pericardiocentesis echocardiogram.  I called the patient at home after he left the office to let him know.  He may need a pericardial window.  I will touch base with Dr. Burt Peterson to discuss further.  He has an appointment with Dr. Burt Peterson later this month.  I have advised that he keep that appointment for now.  2. Abdominal aortic aneurysm (AAA) without rupture (HCC) His abdominal aortic aneurysm has grown to 5.5 cm.  I have recommended referral to vascular surgery for further evaluation and surveillance.  3. Left renal mass He has a 3.1 cm solid left renal mass in the upper pole.  I have recommended that we refer him to urology for further evaluation.  4. Stage 3a chronic kidney disease Recent creatinine stable.  He is followed by nephrology.  5. Paroxysmal atrial fibrillation (HCC) This is a new diagnosis.  His rate is controlled. CHA2DS2-VASc=5 (CHF, vascular disease, hypertension, age).  He would benefit from long-term anticoagulation for stroke prophylaxis.  However, given his current comorbid conditions, I do not think it is best to start anticoagulation at this time.  He has a renal mass that needs evaluation.  He also has an abdominal aortic aneurysm that is at 5.5 cm.  He has a pericardial effusion that has returned soon after a recent pericardiocentesis.  He may indeed need a pericardial window.  I reviewed his case today with Dr. Angelena Form  (attending MD).  He agreed that we should not start anticoagulation at this time.  Again, I will review his case with Dr. Burt Peterson.  Keep appointment later this month.   Dispo:  Return in 24 days (on 12/13/2019) for Routine Follow Up, w/ Dr. Burt Peterson, in person.   Medication Adjustments/Labs and Tests Ordered: Current medicines  are reviewed at length with the patient today.  Concerns regarding medicines are outlined above.  Tests Ordered: Orders Placed This Encounter  Procedures  . Ambulatory referral to Vascular Surgery  . Ambulatory referral to Urology  . EKG 12-Lead   Medication Changes: No orders of the defined types were placed in this encounter.   Signed, Richardson Dopp, PA-C  11/19/2019 1:41 PM    [facility] [address], [place], [place]  [address] Phone: [phone]; Fax: [phone]

## 2019-11-22 ENCOUNTER — Telehealth: Payer: Self-pay | Admitting: Cardiovascular Disease

## 2019-11-22 NOTE — Telephone Encounter (Signed)
I spoke with the patient's daughter who states that the patient was seen by Richardson Dopp, PA-C on Friday. Patient has a pericardial effusion that Nicki Reaper would like to speak with Dr. Burt Knack about. Patient's daughter was just calling to touch base about whether they had spoken and come up with a plan. She would like Katy to give her a call whenever they have more information.

## 2019-11-22 NOTE — Telephone Encounter (Signed)
New Message  Patient's daughter Wells Guiles is calling in to speak with Dr. Burt Knack or his nurse about patient. Informed that Valetta Fuller is not in office today. Patient's daughter is wanting a call back on 11/23/19 to discuss patient. Please give patient's daughter a call back to assist.

## 2019-11-23 ENCOUNTER — Telehealth: Payer: Self-pay | Admitting: Physician Assistant

## 2019-11-23 DIAGNOSIS — M5441 Lumbago with sciatica, right side: Secondary | ICD-10-CM | POA: Diagnosis not present

## 2019-11-23 DIAGNOSIS — I313 Pericardial effusion (noninflammatory): Secondary | ICD-10-CM

## 2019-11-23 DIAGNOSIS — M5442 Lumbago with sciatica, left side: Secondary | ICD-10-CM | POA: Diagnosis not present

## 2019-11-23 DIAGNOSIS — D631 Anemia in chronic kidney disease: Secondary | ICD-10-CM | POA: Diagnosis not present

## 2019-11-23 DIAGNOSIS — I129 Hypertensive chronic kidney disease with stage 1 through stage 4 chronic kidney disease, or unspecified chronic kidney disease: Secondary | ICD-10-CM | POA: Diagnosis not present

## 2019-11-23 DIAGNOSIS — I3139 Other pericardial effusion (noninflammatory): Secondary | ICD-10-CM

## 2019-11-23 DIAGNOSIS — E1122 Type 2 diabetes mellitus with diabetic chronic kidney disease: Secondary | ICD-10-CM | POA: Diagnosis not present

## 2019-11-23 DIAGNOSIS — N183 Chronic kidney disease, stage 3 unspecified: Secondary | ICD-10-CM | POA: Diagnosis not present

## 2019-11-23 NOTE — Telephone Encounter (Signed)
See 4/6 phone note. Richardson Dopp personally called and spoke to the patient.

## 2019-11-23 NOTE — Telephone Encounter (Signed)
Reviewed case with Dr. Burt Knack.  We recommend referral to [facility] for possible pericardial window due to recurrent pericardial effusion.  I called patient today and explained the procedure.  He is willing to see the surgeon to discuss.  PLAN: 1. Please refer to [facility] for pericardial window.  Patient should be seen soon as he just had a pericardiocentesis and the effusion recurred in a little over a week.  He currently is not having any symptoms and his echocardiogram last Friday did not show tamponade physiology. Richardson Dopp, PA-C    11/23/2019 8:59 AM

## 2019-11-23 NOTE — Telephone Encounter (Signed)
[facility] referral placed.

## 2019-11-25 DIAGNOSIS — N183 Chronic kidney disease, stage 3 unspecified: Secondary | ICD-10-CM | POA: Diagnosis not present

## 2019-11-25 DIAGNOSIS — M5441 Lumbago with sciatica, right side: Secondary | ICD-10-CM | POA: Diagnosis not present

## 2019-11-25 DIAGNOSIS — D631 Anemia in chronic kidney disease: Secondary | ICD-10-CM | POA: Diagnosis not present

## 2019-11-25 DIAGNOSIS — E1122 Type 2 diabetes mellitus with diabetic chronic kidney disease: Secondary | ICD-10-CM | POA: Diagnosis not present

## 2019-11-25 DIAGNOSIS — I129 Hypertensive chronic kidney disease with stage 1 through stage 4 chronic kidney disease, or unspecified chronic kidney disease: Secondary | ICD-10-CM | POA: Diagnosis not present

## 2019-11-25 DIAGNOSIS — M5442 Lumbago with sciatica, left side: Secondary | ICD-10-CM | POA: Diagnosis not present

## 2019-11-29 ENCOUNTER — Ambulatory Visit (INDEPENDENT_AMBULATORY_CARE_PROVIDER_SITE_OTHER): Payer: MEDICARE | Admitting: Surgery

## 2019-11-29 ENCOUNTER — Encounter: Payer: Self-pay | Admitting: Surgery

## 2019-11-29 ENCOUNTER — Other Ambulatory Visit: Payer: Self-pay

## 2019-11-29 VITALS — BP 168/91 | HR 75 | Temp 97.4°F | Resp 20 | Ht 69.0 in | Wt 135.0 lb

## 2019-11-29 DIAGNOSIS — I714 Abdominal aortic aneurysm, without rupture, unspecified: Secondary | ICD-10-CM

## 2019-11-29 DIAGNOSIS — I701 Atherosclerosis of renal artery: Secondary | ICD-10-CM | POA: Diagnosis not present

## 2019-11-29 NOTE — Progress Notes (Signed)
[facility]  Patient name: Chris Peterson MRN: [id number] DOB: 01/07/1930 Sex: male   REQUESTING PROVIDER:    Richardson Dopp   REASON FOR CONSULT:    AAA  HISTORY OF PRESENT ILLNESS:   Chris Peterson is a 86 y.o. male, who is referred for evaluation of a 5.5 cm aneurysm visualized on abdominal ultrasound on Dec 18, 2019.  The patient denies any abdominal pain.  A noncontrast CT scan in 2018 showed a 3.8 cm aneurysm.  Patient is recently status post pericardiocentesis for an effusion.  At his most recent visit, the effusion has returned.  There is no evidence of tamponade.  He was also recently diagnosed with new onset atrial fibrillation.  Anticoagulation was not started because of his comorbidities.  He suffers from diastolic heart failure.  The patient has undergone left renal artery stenting in 2013 for renal artery stenosis.  He has chronic renal insufficiency, stage III with a most recent creatinine of 1.49 he is medically managed for hypertension with an ACE inhibitor.  He takes a statin for hypercholesterolemia.  He is a current smoker.  PAST MEDICAL HISTORY    Past Medical History:  Diagnosis Date  . AAA (abdominal aortic aneurysm) (HCC)    Infrarenal AAA - maximal diameter of 3.5 cm   . Atherosclerotic renal artery stenosis, bilateral (HCC)    Bilateral renal angiography 11/27/11, Right 70% prox, Left 90% prox s/p LRA PTA/Stenting - 6.0x63m ;  12/12/2011 Renal Duplex: Patent LRA stent & > 60% RRA ost stenosis  . Blood transfusion    S/P "lost blood due to hemorrhoids"  . Dysrhythmia    "fast one time"  . Hemorrhoids   . High cholesterol   . HTN (hypertension)   . Kidney disease, chronic, stage III (moderate, EGFR 30-59 ml/min)   . Pericardial effusion    a. incidentally noted on renal u/s 11/2011;  b.  12/16/2011 Echo:  EF 55-60%, Gr1 DD, Mild MR, PASP 32, Mod circumferential peric effusion w/o hemodynamic  compromise. Density in pericardial space - consider MRI  . [place]      FAMILY HISTORY   Family History  Problem Relation Age of Onset  . Hypertension Mother   . Hypertension Brother     SOCIAL HISTORY:   Social History   Socioeconomic History  . Marital status: Married    Spouse name: Not on file  . Number of children: Not on file  . Years of education: Not on file  . Highest education level: Not on file  Occupational History  . Not on file  Tobacco Use  . Smoking status: Current Every Day Smoker    Years: 65.00    Types: Pipe, Cigarettes    Last attempt to quit: 08/19/1996    Years since quitting: 23.2  . Smokeless tobacco: Never Used  . Tobacco comment: 11/27/11 "smoked pipe since 1998; quit cigarettes 1998"  Substance and Sexual Activity  . Alcohol use: Yes    Comment: 03/13/18 2-3 Oz daily   . Drug use: No  . Sexual activity: Never  Other Topics Concern  . Not on file  Social History Narrative  . Not on file   Social Determinants of Health   Financial Resource Strain:   . Difficulty of Paying Living Expenses:   Food Insecurity:   . Worried About RCharity fundraiserin the Last Year:   . RArleein the Last Year:  Transportation Needs:   . Film/video editor (Medical):   [name] Lack of Transportation (Non-Medical):   Physical Activity:   . Days of Exercise per Week:   . Minutes of Exercise per Session:   Stress:   . Feeling of Stress :   Social Connections:   . Frequency of Communication with Friends and Family:   . Frequency of Social Gatherings with Friends and Family:   . Attends Religious Services:   . Active Member of Clubs or Organizations:   . Attends Archivist Meetings:   [name] Marital Status:   Intimate Partner Violence:   . Fear of Current or Ex-Partner:   . Emotionally Abused:   [name] Physically Abused:   . Sexually Abused:     ALLERGIES:    No Known Allergies  CURRENT MEDICATIONS:    Current  Outpatient Medications  Medication Sig Dispense Refill  . carvedilol (COREG) 12.5 MG tablet Take 12.5 mg by mouth 2 (two) times daily with a meal.     . cholecalciferol (VITAMIN D) 1000 units tablet Take 1,000 mg by mouth every other day.     . furosemide (LASIX) 40 MG tablet Take 1 tablet (40 mg total) by mouth daily. 90 tablet 3  . lisinopril (ZESTRIL) 10 MG tablet Take 10 mg by mouth daily.    [name] loperamide (IMODIUM) 2 MG capsule Take 1 capsule (2 mg total) by mouth as needed for diarrhea or loose stools. 120 capsule 1  . rosuvastatin (CRESTOR) 40 MG tablet Take 40 mg by mouth daily.    . tamsulosin (FLOMAX) 0.4 MG CAPS capsule Take 0.4 mg by mouth daily.      No current facility-administered medications for this visit.    REVIEW OF SYSTEMS:   '[X]'$  denotes positive finding, '[ ]'$  denotes negative finding Cardiac  Comments:  Chest pain or chest pressure:    Shortness of breath upon exertion:    Short of breath when lying flat:    Irregular heart rhythm:        Vascular    Pain in calf, thigh, or hip brought on by ambulation:    Pain in feet at night that wakes you up from your sleep:     Blood clot in your veins:    Leg swelling:  x       Pulmonary    Oxygen at home:    Productive cough:     Wheezing:         Neurologic    Sudden weakness in arms or legs:     Sudden numbness in arms or legs:     Sudden onset of difficulty speaking or slurred speech:    Temporary loss of vision in one eye:     Problems with dizziness:         Gastrointestinal    Blood in stool:      Vomited blood:         Genitourinary    Burning when urinating:     Blood in urine:        Psychiatric    Major depression:         Hematologic    Bleeding problems:    Problems with blood clotting too easily:        Skin    Rashes or ulcers:        Constitutional    Fever or chills:     PHYSICAL EXAM:   There were no vitals filed for this visit.  GENERAL: The patient is a well-nourished  male, in no acute distress. The vital signs are documented above. CARDIAC: There is a regular rate and rhythm.  VASCULAR: I cannot palpate pedal pulses PULMONARY: Nonlabored respirations ABDOMEN: Soft and non-tender with normal pitched bowel sounds.  MUSCULOSKELETAL: There are no major deformities or cyanosis. NEUROLOGIC: No focal weakness or paresthesias are detected. SKIN: There are no ulcers or rashes noted. PSYCHIATRIC: The patient has a normal affect.  STUDIES:   I have reviewed the following abdominal ultrasound:  Abdominal aortic aneurysm with maximum diameter of 5.5 cm at the proximal aorta. Recommend followup by abdomen and pelvis CTA in 3-6 months, and vascular surgery referral/consultation if not already obtained. This recommendation follows ACR consensus guidelines: White Paper of the ACR Incidental Findings Committee II on Vascular Findings. J Am Coll Radiol 2013; 10:789-794.  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  I discussed that he would need a CT angiogram of the chest abdomen and pelvis to determine exactly how that his aneurysm is and to see what his options are for repair.  I will try to get this done within the week.  My suspicion is that the ultrasound has overestimated the size of the aneurysm since this measured 3.8 cm in December of 2018.  Because of his renal issues I encouraged him to hydrate before and after his CT scan.  Leia Alf, MD, FACS [facility] [phone] Pager [phone]

## 2019-11-30 ENCOUNTER — Other Ambulatory Visit: Payer: Self-pay

## 2019-11-30 DIAGNOSIS — E1122 Type 2 diabetes mellitus with diabetic chronic kidney disease: Secondary | ICD-10-CM | POA: Diagnosis not present

## 2019-11-30 DIAGNOSIS — D631 Anemia in chronic kidney disease: Secondary | ICD-10-CM | POA: Diagnosis not present

## 2019-11-30 DIAGNOSIS — M5442 Lumbago with sciatica, left side: Secondary | ICD-10-CM | POA: Diagnosis not present

## 2019-11-30 DIAGNOSIS — I714 Abdominal aortic aneurysm, without rupture, unspecified: Secondary | ICD-10-CM

## 2019-11-30 DIAGNOSIS — M5441 Lumbago with sciatica, right side: Secondary | ICD-10-CM | POA: Diagnosis not present

## 2019-11-30 DIAGNOSIS — I129 Hypertensive chronic kidney disease with stage 1 through stage 4 chronic kidney disease, or unspecified chronic kidney disease: Secondary | ICD-10-CM | POA: Diagnosis not present

## 2019-11-30 DIAGNOSIS — N183 Chronic kidney disease, stage 3 unspecified: Secondary | ICD-10-CM | POA: Diagnosis not present

## 2019-12-01 ENCOUNTER — Other Ambulatory Visit: Payer: Self-pay | Admitting: *Deleted

## 2019-12-01 ENCOUNTER — Encounter: Payer: Self-pay | Admitting: Cardiothoracic Surgery

## 2019-12-01 ENCOUNTER — Other Ambulatory Visit: Payer: Self-pay

## 2019-12-01 ENCOUNTER — Encounter: Payer: Self-pay | Admitting: *Deleted

## 2019-12-01 ENCOUNTER — Institutional Professional Consult (permissible substitution) (INDEPENDENT_AMBULATORY_CARE_PROVIDER_SITE_OTHER): Payer: MEDICARE | Admitting: Cardiothoracic Surgery

## 2019-12-01 DIAGNOSIS — N1831 Chronic kidney disease, stage 3a: Secondary | ICD-10-CM | POA: Diagnosis not present

## 2019-12-01 DIAGNOSIS — I701 Atherosclerosis of renal artery: Secondary | ICD-10-CM

## 2019-12-01 DIAGNOSIS — I129 Hypertensive chronic kidney disease with stage 1 through stage 4 chronic kidney disease, or unspecified chronic kidney disease: Secondary | ICD-10-CM | POA: Diagnosis not present

## 2019-12-01 DIAGNOSIS — I313 Pericardial effusion (noninflammatory): Secondary | ICD-10-CM

## 2019-12-01 DIAGNOSIS — I5032 Chronic diastolic (congestive) heart failure: Secondary | ICD-10-CM

## 2019-12-01 DIAGNOSIS — I3139 Other pericardial effusion (noninflammatory): Secondary | ICD-10-CM

## 2019-12-01 DIAGNOSIS — I5030 Unspecified diastolic (congestive) heart failure: Secondary | ICD-10-CM | POA: Insufficient documentation

## 2019-12-01 NOTE — Progress Notes (Signed)
PCP is Lawerance Cruel, MD Referring Provider is Sherren Mocha, MD  Chief Complaint  Patient presents with  . Pericardial Effusion    s/p pericardiocentesis on 10/27/19  Patient examined, images of echocardiograms and chest CT scan personally reviewed.  HPI: 86 year old patient presents for evaluation of recurrent pericardial effusion.  In early March he was evaluated for shortness of breath and found to have large pericardial effusion.  Patient's cardiac history is positive for atrial fibrillation on no anticoagulation due to history of GI bleeding as well as a 5 cm abdominal aneurysm.  He had no significant valvular disease or ventricular dysfunction.  He had an outpatient pericardiocentesis by Dr. Burt Knack at 750 cc of serous fluid.  Right heart cath pressures and cardiac output were normal.  His symptoms improved.  10 days ago the patient had a follow-up cardiac echo which showed some recurrence of the effusion but less than 2 cm diameter.  The past few days the patient has had noted some increasing shortness of breath with his usual activities.  No orthopnea PND ankle edema or abdominal edema.  The patient has been recommended by his cardiologist for subxiphoid pericardial window for recurrent pericardial effusion.  Pericardial fluid showed no evidence of infection or malignancy.  Inflammatory mesenchymal cells were noted.  Patient generally is in good health.  He lives with his wife and functions normally.  He is not been hospitalized for several years since he had the upper GI bleeding.  His chest CT scan in early March shows some calcification of the thoracic aorta but no aneurysmal dilatation.  No pleural effusion was noted.  Lung windows are clear.   Past Medical History:  Diagnosis Date  . AAA (abdominal aortic aneurysm) (HCC)    Infrarenal AAA - maximal diameter of 3.5 cm   . Atherosclerotic renal artery stenosis, bilateral (HCC)    Bilateral renal angiography 11/27/11, Right 70%  prox, Left 90% prox s/p LRA PTA/Stenting - 6.0x25m ;  12/12/2011 Renal Duplex: Patent LRA stent & > 60% RRA ost stenosis  . Blood transfusion    S/P "lost blood due to hemorrhoids"  . Dysrhythmia    "fast one time"  . Hemorrhoids   . High cholesterol   . HTN (hypertension)   . Kidney disease, chronic, stage III (moderate, EGFR 30-59 ml/min)   . Pericardial effusion    a. incidentally noted on renal u/s 11/2011;  b.  12/16/2011 Echo:  EF 55-60%, Gr1 DD, Mild MR, PASP 32, Mod circumferential peric effusion w/o hemodynamic compromise. Density in pericardial space - consider MRI  . [place] II diabetes [place] ([place]     Past Surgical History:  Procedure Laterality Date  . ABDOMINAL ANGIOGRAM N/A 11/27/2011   Procedure: ABDOMINAL ANGIOGRAM;  Surgeon: MSherren Mocha MD;  [facility];  Service: Cardiovascular;  Laterality: N/A;  . BIOPSY  03/13/2018   Procedure: BIOPSY;  Surgeon: SLadene Artist MD;  Location: [facility] ENDOSCOPY;  Service: Endoscopy;;  . CATARACT EXTRACTION W/ INTRAOCULAR LENS  IMPLANT, BILATERAL  1990's  . ESOPHAGOGASTRODUODENOSCOPY N/A 03/13/2018   Procedure: ESOPHAGOGASTRODUODENOSCOPY (EGD);  Surgeon: SLadene Artist MD;  [facility];  Service: Endoscopy;  Laterality: N/A;  . HOT HEMOSTASIS N/A 03/13/2018   Procedure: HOT HEMOSTASIS (ARGON PLASMA COAGULATION/BICAP);  Surgeon: SLadene Artist MD;  [facility];  Service: Endoscopy;  Laterality: N/A;  . NASAL SEPTUM SURGERY  1980's  . PERICARDIOCENTESIS N/A 10/27/2019   Procedure: PERICARDIOCENTESIS;  Surgeon: CSherren Mocha MD;  [facility];  Service: Cardiovascular;  Laterality: N/A;  . RENAL ANGIOGRAM Bilateral 11/27/2011   Procedure: RENAL ANGIOGRAM;  Surgeon: Sherren Mocha, MD;  Location: [facility] CATH LAB;  Service: Cardiovascular;  Laterality: Bilateral;  possible PTA/stent  . RENAL ARTERY STENT  11/27/11  . RIGHT HEART CATH N/A 10/27/2019   Procedure: RIGHT HEART CATH;  Surgeon:  Sherren Mocha, MD;  Location: [facility] CV LAB;  Service: Cardiovascular;  Laterality: N/A;    Family History  Problem Relation Age of Onset  . Hypertension Mother   . Hypertension Brother     Social History Social History   Tobacco Use  . Smoking status: Current Every Day Smoker    Years: 65.00    Types: Pipe, Cigarettes    Last attempt to quit: 08/19/1996    Years since quitting: 23.2  . Smokeless tobacco: Never Used  . Tobacco comment: 11/27/11 "smoked pipe since 1998; quit cigarettes 1998"  Substance Use Topics  . Alcohol use: Yes    Comment: 03/13/18 2-3 Oz daily   . Drug use: No    Current Outpatient Medications  Medication Sig Dispense Refill  . carvedilol (COREG) 12.5 MG tablet Take 12.5 mg by mouth 2 (two) times daily with a meal.     . cholecalciferol (VITAMIN D) 1000 units tablet Take 1,000 mg by mouth every other day.     . furosemide (LASIX) 40 MG tablet Take 1 tablet (40 mg total) by mouth daily. 90 tablet 3  . lisinopril (ZESTRIL) 10 MG tablet Take 10 mg by mouth daily.    [name] loperamide (IMODIUM) 2 MG capsule Take 1 capsule (2 mg total) by mouth as needed for diarrhea or loose stools. 120 capsule 1  . rosuvastatin (CRESTOR) 40 MG tablet Take 40 mg by mouth daily.    . tamsulosin (FLOMAX) 0.4 MG CAPS capsule Take 0.4 mg by mouth daily.      No current facility-administered medications for this visit.    No Known Allergies  Review of Systems                    Review of Systems :  [ y ] = yes, [  ] = no        General :  Weight gain [   ]    Weight loss  [   ]  Fatigue [ y ]  Fever [  ]  Chills  [  ]                                          HEENT    Headache [  ]  Dizziness [  ]  Blurred vision [  ] Glaucoma  [  ]                          Nosebleeds [  ] Painful or loose teeth [  ]        Cardiac :  Chest pain/ pressure [  ]  Resting SOB [  ] exertional SOB [ y ]                        Orthopnea [  ]  Pedal edema  [  ]  Palpitations [  ]  Syncope/presyncope '[ ]'$   Paroxysmal nocturnal dyspnea [  ]         Pulmonary : cough [  ]  wheezing [  ]  Hemoptysis [  ] Sputum [  ] Snoring [  ]                              Pneumothorax [  ]  Sleep apnea [  ]        GI : Vomiting [  ]  Dysphagia [  ]  Melena  [  ]  Abdominal pain [  ] BRBPR [  ]              Heart burn [  ]  Constipation [  ] Diarrhea  [  ] Colonoscopy [   ] history of upper GI bleeding from peptic disease over 3 years ago now resolved        GU : Hematuria [  ]  Dysuria [  ]  Nocturia [  ] UTI's Blue.Reese  ] patient recently finished a course of oral antibiotics for UTI.  By his primary care.  He takes Flomax for BPH.        Vascular : Claudication [  ]  Rest pain [  ]  DVT [  ] Vein stripping [  ] leg ulcers [  ] recent diagnosis of a abdominal aneurysm being evaluated by Dr. Trula Slade                          TIA [  ] Stroke [  ]  Varicose veins [  ]        NEURO :  Headaches  [  ] Seizures [  ] Vision changes [  ] Paresthesias [  ]    right-hand-dominant                                           Musculoskeletal :  Arthritis [  ] Gout  [  ]  Back pain [  ]  Joint pain [  ]        Skin :  Rash [  ]  Melanoma [  ] Sores [  ]        Heme : Bleeding problems [  ]Clotting Disorders [  ] Anemia [  ]Blood Transfusion '[ ]'$         Endocrine : Diabetes [  ] Heat or Cold intolerance [  ] Polyuria [  ]excessive thirst '[ ]'$         Psych : Depression [  ]  Anxiety [  ]  Psych hospitalizations [  ] Memory change [  ]                                                                            BP 140/82 (BP Location: Right Arm) Comment (BP Location): manual check  Pulse 75   Temp (!) 97.5 F (36.4 C) (Temporal)   Resp 20   Ht '5\' 9"'$  (1.753  m)   Wt 135 lb (61.2 kg)   SpO2 98% Comment: RA  BMI 19.94 kg/m  Physical Exam     Physical Exam  General: Elderly but very sharp 86 year old male no acute distress accompanied by his daughter HEENT: Normocephalic  pupils equal , dentition adequate Neck: Supple without JVD, adenopathy, or bruit Chest: Clear to auscultation, symmetrical breath sounds, no rhonchi, no tenderness             or deformity Cardiovascular: Irregular heart rate-atrial fibrillation, no murmur, no gallop, peripheral pulses             palpable in all extremities Abdomen:  Soft, nontender, no palpable mass or organomegaly Extremities: Warm, well-perfused, no clubbing cyanosis edema or tenderness,              no venous stasis changes of the legs Rectal/GU: Deferred Neuro: Grossly non--focal and symmetrical throughout Skin: Clean and dry without rash or ulceration   Diagnostic Tests: Echocardiogram 10 to 12 days ago reviewed showing some recurrence of pericardial effusion good LV function  Impression: Recurrent pericardial effusion, benign by previous analysis.  Probably related to hypertension and diastolic heart failure. Patient will need subxiphoid pericardial window for definitive therapy and to prevent recurrence.  I discussed the procedure benefits and risks in detail with the patient and his daughter.  Early next week the patient is scheduled for an abdominal CT scan and evaluation by Dr. Trula Slade for a AAA.  We will schedule the elective pericardial window for April 22 and anticipate approximately 3 nights in the hospital.  The patient and daughter understand the benefits and risks of surgery.  Plan: Admit to [facility] hospital April 22 via the OR for subxiphoid pericardial window   Len Childs, MD [facility] [phone]

## 2019-12-02 DIAGNOSIS — I129 Hypertensive chronic kidney disease with stage 1 through stage 4 chronic kidney disease, or unspecified chronic kidney disease: Secondary | ICD-10-CM | POA: Diagnosis not present

## 2019-12-02 DIAGNOSIS — D631 Anemia in chronic kidney disease: Secondary | ICD-10-CM | POA: Diagnosis not present

## 2019-12-02 DIAGNOSIS — E1122 Type 2 diabetes mellitus with diabetic chronic kidney disease: Secondary | ICD-10-CM | POA: Diagnosis not present

## 2019-12-02 DIAGNOSIS — M5441 Lumbago with sciatica, right side: Secondary | ICD-10-CM | POA: Diagnosis not present

## 2019-12-02 DIAGNOSIS — N183 Chronic kidney disease, stage 3 unspecified: Secondary | ICD-10-CM | POA: Diagnosis not present

## 2019-12-02 DIAGNOSIS — M5442 Lumbago with sciatica, left side: Secondary | ICD-10-CM | POA: Diagnosis not present

## 2019-12-03 ENCOUNTER — Other Ambulatory Visit: Payer: Self-pay

## 2019-12-03 ENCOUNTER — Ambulatory Visit (HOSPITAL_COMMUNITY)
Admission: RE | Admit: 2019-12-03 | Discharge: 2019-12-03 | Disposition: A | Discharge status: Home / Self Care | Payer: MEDICARE | Source: Ambulatory Visit | Attending: Surgery | Admitting: Surgery

## 2019-12-03 DIAGNOSIS — I714 Abdominal aortic aneurysm, without rupture, unspecified: Secondary | ICD-10-CM

## 2019-12-03 DIAGNOSIS — I700 Atherosclerosis of aorta: Secondary | ICD-10-CM | POA: Diagnosis not present

## 2019-12-03 MED ORDER — IOHEXOL 350 MG/ML SOLN
60.0000 mL | Freq: Once | INTRAVENOUS | Status: AC | PRN
  Administered 2019-12-03: 60 mL via INTRAVENOUS

## 2019-12-06 ENCOUNTER — Other Ambulatory Visit: Payer: Self-pay

## 2019-12-06 ENCOUNTER — Encounter: Payer: Self-pay | Admitting: Surgery

## 2019-12-06 ENCOUNTER — Other Ambulatory Visit (HOSPITAL_COMMUNITY): Payer: MEDICARE

## 2019-12-06 ENCOUNTER — Ambulatory Visit (INDEPENDENT_AMBULATORY_CARE_PROVIDER_SITE_OTHER): Payer: MEDICARE | Admitting: Surgery

## 2019-12-06 VITALS — BP 145/70

## 2019-12-06 DIAGNOSIS — I714 Abdominal aortic aneurysm, without rupture, unspecified: Secondary | ICD-10-CM

## 2019-12-06 NOTE — Progress Notes (Signed)
[facility] [place]  Patient name: Chris Peterson MRN: [id number] DOB: 03/09/1930 Sex: male      Virtual Visit via Telephone Note   This visit type was conducted due to national recommendations for restrictions regarding the COVID-19 Pandemic (e.g. social distancing) in an effort to limit this patient's exposure and mitigate transmission in our community.  Due to his co-morbid illnesses, this patient is at least at moderate risk for complications without adequate follow up.  This format is felt to be most appropriate for this patient at this time.  The patient did not have access to video technology/had technical difficulties with video requiring transitioning to audio format only (telephone).  All issues noted in this document were discussed and addressed.  No physical exam could be performed with this format.    Patient Location: Home Provider Location: Office    REASON FOR APPOINTMENT:    Follow up  HISTORY OF PRESENT ILLNESS:    Chris Peterson is a 86 y.o. male, who is referred for evaluation of a 5.5 cm aneurysm visualized on abdominal ultrasound on Dec 18, 2019.  The patient denies any abdominal pain.  A noncontrast CT scan in 2018 showed a 3.8 cm aneurysm.  In order to adequately define size of his aneurysm, I sent him for CT scan.  He is here today to discuss these results.  Patient is recently status post pericardiocentesis for an effusion.  At his most recent visit, the effusion has returned.  There is no evidence of tamponade.  He was also recently diagnosed with new onset atrial fibrillation.  Anticoagulation was not started because of his comorbidities.  He suffers from diastolic heart failure.  The patient has undergone left renal artery stenting in 2013 for renal artery stenosis.  He has chronic renal insufficiency, stage III with a most recent creatinine of 1.49 he is medically managed for hypertension with an ACE inhibitor.  He takes  a statin for hypercholesterolemia.  He is a current smoker.  The patient does not have symptoms concerning for COVID-19 infection (fever, chills, cough, or new shortness of breath).   PAST MEDICAL HISTORY    Past Medical History:  Diagnosis Date  . AAA (abdominal aortic aneurysm) (HCC)    Infrarenal AAA - maximal diameter of 3.5 cm   . Atherosclerotic renal artery stenosis, bilateral (HCC)    Bilateral renal angiography 11/27/11, Right 70% prox, Left 90% prox s/p LRA PTA/Stenting - 6.0x61m ;  12/12/2011 Renal Duplex: Patent LRA stent & > 60% RRA ost stenosis  . Blood transfusion    S/P "lost blood due to hemorrhoids"  . Dysrhythmia    "fast one time"  . Hemorrhoids   . High cholesterol   . HTN (hypertension)   . Kidney disease, chronic, stage III (moderate, EGFR 30-59 ml/min)   . Pericardial effusion    a. incidentally noted on renal u/s 11/2011;  b.  12/16/2011 Echo:  EF 55-60%, Gr1 DD, Mild MR, PASP 32, Mod circumferential peric effusion w/o hemodynamic compromise. Density in pericardial space - consider MRI  . Type II diabetes mellitus ([facility]      FAMILY HISTORY   Family History  Problem Relation Age of Onset  . Hypertension Mother   . Hypertension Brother     SOCIAL HISTORY:   Social History   Socioeconomic History  . Marital status: Married    Spouse name: Not on file  .  Number of children: Not on file  . Years of education: Not on file  . Highest education level: Not on file  Occupational History  . Not on file  Tobacco Use  . Smoking status: Current Every Day Smoker    Years: 65.00    Types: Pipe, Cigarettes    Last attempt to quit: 08/19/1996    Years since quitting: 23.3  . Smokeless tobacco: Never Used  . Tobacco comment: 11/27/11 "smoked pipe since 1998; quit cigarettes 1998"  Substance and Sexual Activity  . Alcohol use: Yes    Comment: 03/13/18 2-3 Oz daily   . Drug use: No  . Sexual activity: Never  Other Topics Concern  . Not on file  Social  History Narrative  . Not on file   Social Determinants of Health   Financial Resource Strain:   . Difficulty of Paying Living Expenses:   Food Insecurity:   . Worried About Charity fundraiser in the Last Year:   . Arboriculturist in the Last Year:   Transportation Needs:   . Film/video editor (Medical):   [name] Lack of Transportation (Non-Medical):   Physical Activity:   . Days of Exercise per Week:   . Minutes of Exercise per Session:   Stress:   . Feeling of Stress :   Social Connections:   . Frequency of Communication with Friends and Family:   . Frequency of Social Gatherings with Friends and Family:   . Attends Religious Services:   . Active Member of Clubs or Organizations:   . Attends Archivist Meetings:   [name] Marital Status:   Intimate Partner Violence:   . Fear of Current or Ex-Partner:   . Emotionally Abused:   [name] Physically Abused:   . Sexually Abused:     ALLERGIES:    No Known Allergies  CURRENT MEDICATIONS:    Current Outpatient Medications  Medication Sig Dispense Refill  . carvedilol (COREG) 12.5 MG tablet Take 12.5 mg by mouth 2 (two) times daily with a meal.     . cholecalciferol (VITAMIN D) 1000 units tablet Take 1,000 mg by mouth daily.     . furosemide (LASIX) 20 MG tablet Take 40 mg by mouth daily.    [name] lisinopril (ZESTRIL) 10 MG tablet Take 10 mg by mouth daily.    [name] loperamide (IMODIUM) 2 MG capsule Take 1 capsule (2 mg total) by mouth as needed for diarrhea or loose stools. (Patient taking differently: Take 2 mg by mouth every 6 (six) hours as needed for diarrhea or loose stools. ) 120 capsule 1  . rosuvastatin (CRESTOR) 40 MG tablet Take 40 mg by mouth daily.    . tamsulosin (FLOMAX) 0.4 MG CAPS capsule Take 0.4 mg by mouth daily.     . furosemide (LASIX) 40 MG tablet Take 1 tablet (40 mg total) by mouth daily. (Patient not taking: Reported on 12/01/2019) 90 tablet 3   No current facility-administered medications for this visit.     REVIEW OF SYSTEMS:   Please see the history of present illness.     All other systems reviewed and are negative.  PHYSICAL EXAM:   Vitals:   12/06/19 0945  BP: (!) 145/70     Recent Labs: 10/04/2019: ALT 21; NT-Pro BNP 1,343 10/25/2019: BUN 34; Creatinine, Ser 1.49; Platelets 177 10/27/2019: Hemoglobin 10.2; Potassium 3.0; Sodium 149   Recent Lipid Panel No results found for: CHOL, TRIG, HDL, CHOLHDL, LDLCALC, LDLDIRECT  Wt Readings  from Last 3 Encounters:  12/01/19 135 lb (61.2 kg)  11/29/19 135 lb (61.2 kg)  11/19/19 138 lb (62.6 kg)     STUDIES:   I have reviewed his CT scan with the following findings: 1. Atherosclerosis of the thoracic aorta without evidence of aneurysm.  2. Redemonstrated aneurysmal abdominal aorta, with severe atherosclerosis and a large burden of mural thrombus. Juxtarenal and infrarenal aneurysm components detailed above are enlarged compared to prior examination dated 08/18/2017, measuring up to 5.1 x 4.9 cm and 4.8 x 4.2 cm respectively. Aortic Atherosclerosis (ICD10-I70.0).  3. Small pericardial effusion, decreased compared to prior examination of the chest dated 10/22/2019.  4. New small left pleural effusion and associated atelectasis or consolidation.  5.  Coronary artery disease.  6. Partially imaged lesion of the superior pole of the left kidney described on prior CT is a nonenhancing, benign intermediate attenuation proteinaceous or hemorrhagic cyst on current examination.  7.  Emphysema (ICD10-J43.9).  ASSESSMENT and PLAN   AAA:  Maximum aortic diameter is 5.1 cm.  This is a juxta renal aneurysm that will likely require referral to [facility] for a 4 vessel device.  I would like to wait until his AAA is 5.5 cm before repair.  He is in agreement with this plan.  I will repeat his CTA in 6 months   Time:   Today, I have spent 15 minutes with the patient with telehealth technology discussing the above problems.      Leia Alf, MD, FACS [facility] [phone] Pager [phone]

## 2019-12-07 ENCOUNTER — Ambulatory Visit (HOSPITAL_COMMUNITY)
Admission: RE | Admit: 2019-12-07 | Discharge: 2019-12-07 | Disposition: A | Discharge status: Home / Self Care | Payer: MEDICARE | Source: Ambulatory Visit | Attending: Cardiothoracic Surgery | Admitting: Cardiothoracic Surgery

## 2019-12-07 ENCOUNTER — Encounter (HOSPITAL_COMMUNITY)
Admission: RE | Admit: 2019-12-07 | Discharge: 2019-12-07 | Disposition: A | Discharge status: Home / Self Care | Payer: MEDICARE | Source: Ambulatory Visit | Attending: Cardiothoracic Surgery | Admitting: Cardiothoracic Surgery

## 2019-12-07 ENCOUNTER — Other Ambulatory Visit: Payer: Self-pay

## 2019-12-07 ENCOUNTER — Encounter (HOSPITAL_COMMUNITY): Payer: Self-pay

## 2019-12-07 ENCOUNTER — Other Ambulatory Visit: Payer: Self-pay | Admitting: *Deleted

## 2019-12-07 ENCOUNTER — Other Ambulatory Visit (HOSPITAL_COMMUNITY)
Admission: RE | Admit: 2019-12-07 | Discharge: 2019-12-07 | Disposition: A | Discharge status: Home / Self Care | Payer: MEDICARE | Source: Ambulatory Visit | Attending: Cardiothoracic Surgery | Admitting: Cardiothoracic Surgery

## 2019-12-07 DIAGNOSIS — I313 Pericardial effusion (noninflammatory): Secondary | ICD-10-CM

## 2019-12-07 DIAGNOSIS — I3139 Other pericardial effusion (noninflammatory): Secondary | ICD-10-CM

## 2019-12-07 DIAGNOSIS — Z20822 Contact with and (suspected) exposure to covid-19: Secondary | ICD-10-CM | POA: Insufficient documentation

## 2019-12-07 DIAGNOSIS — R918 Other nonspecific abnormal finding of lung field: Secondary | ICD-10-CM | POA: Diagnosis not present

## 2019-12-07 DIAGNOSIS — I700 Atherosclerosis of aorta: Secondary | ICD-10-CM | POA: Insufficient documentation

## 2019-12-07 DIAGNOSIS — Z01818 Encounter for other preprocedural examination: Secondary | ICD-10-CM | POA: Insufficient documentation

## 2019-12-07 HISTORY — DX: Dyspnea, unspecified: R06.00

## 2019-12-07 HISTORY — DX: Prediabetes: R73.03

## 2019-12-07 HISTORY — DX: Sciatica, unspecified side: M54.30

## 2019-12-07 LAB — URINALYSIS, ROUTINE W REFLEX MICROSCOPIC
Bilirubin Urine: NEGATIVE
Glucose, UA: NEGATIVE mg/dL
Hgb urine dipstick: NEGATIVE
Ketones, ur: NEGATIVE mg/dL
Nitrite: NEGATIVE
Protein, ur: 100 mg/dL — AB
Specific Gravity, Urine: 1.015 (ref 1.005–1.030)
WBC, UA: >50 WBC/hpf — ABNORMAL HIGH (ref 0–5)
pH: 6 (ref 5.0–8.0)

## 2019-12-07 LAB — CBC
HCT: 39.6 % (ref 39.0–52.0)
Hemoglobin: 12.3 g/dL — ABNORMAL LOW (ref 13.0–17.0)
MCH: 25.8 pg — ABNORMAL LOW (ref 26.0–34.0)
MCHC: 31.1 g/dL (ref 30.0–36.0)
MCV: 83.2 fL (ref 80.0–100.0)
Platelets: 174 10*3/uL (ref 150–400)
RBC: 4.76 MIL/uL (ref 4.22–5.81)
RDW: 18.1 % — ABNORMAL HIGH (ref 11.5–15.5)
WBC: 7.1 10*3/uL (ref 4.0–10.5)
nRBC: 0 % (ref 0.0–0.2)

## 2019-12-07 LAB — COMPREHENSIVE METABOLIC PANEL
ALT: 17 U/L (ref 0–44)
AST: 28 U/L (ref 15–41)
Albumin: 3.3 g/dL — ABNORMAL LOW (ref 3.5–5.0)
Alkaline Phosphatase: 60 U/L (ref 38–126)
Anion gap: 13 (ref 5–15)
BUN: 28 mg/dL — ABNORMAL HIGH (ref 8–23)
CO2: 22 mmol/L (ref 22–32)
Calcium: 9 mg/dL (ref 8.9–10.3)
Chloride: 107 mmol/L (ref 98–111)
Creatinine, Ser: 1.35 mg/dL — ABNORMAL HIGH (ref 0.61–1.24)
GFR calc Af Amer: 54 mL/min — ABNORMAL LOW (ref >60)
GFR calc non Af Amer: 47 mL/min — ABNORMAL LOW (ref >60)
Glucose, Bld: 120 mg/dL — ABNORMAL HIGH (ref 70–99)
Potassium: 4.1 mmol/L (ref 3.5–5.1)
Sodium: 142 mmol/L (ref 135–145)
Total Bilirubin: 0.6 mg/dL (ref 0.3–1.2)
Total Protein: 7 g/dL (ref 6.5–8.1)

## 2019-12-07 LAB — BLOOD GAS, ARTERIAL
Acid-Base Excess: 1.3 mmol/L (ref 0.0–2.0)
Bicarbonate: 25.2 mmol/L (ref 20.0–28.0)
Drawn by: 421801
FIO2: 21
O2 Saturation: 99.1 %
Patient temperature: 37
pCO2 arterial: 39 mmHg (ref 32.0–48.0)
pH, Arterial: 7.427 (ref 7.350–7.450)
pO2, Arterial: 135 mmHg — ABNORMAL HIGH (ref 83.0–108.0)

## 2019-12-07 LAB — PROTIME-INR
INR: 1.2 (ref 0.8–1.2)
Prothrombin Time: 14.7 seconds (ref 11.4–15.2)

## 2019-12-07 LAB — TYPE AND SCREEN
ABO/RH(D): A POS
Antibody Screen: NEGATIVE

## 2019-12-07 LAB — SARS CORONAVIRUS 2 (TAT 6-24 HRS): SARS Coronavirus 2: NEGATIVE

## 2019-12-07 LAB — SURGICAL PCR SCREEN
MRSA, PCR: NEGATIVE
Staphylococcus aureus: NEGATIVE

## 2019-12-07 LAB — APTT: aPTT: 36 seconds (ref 24–36)

## 2019-12-07 LAB — HEMOGLOBIN A1C
Hgb A1c MFr Bld: 6.3 % — ABNORMAL HIGH (ref 4.8–5.6)
Mean Plasma Glucose: 134.11 mg/dL

## 2019-12-07 LAB — GLUCOSE, CAPILLARY: Glucose-Capillary: 137 mg/dL — ABNORMAL HIGH (ref 70–99)

## 2019-12-07 LAB — ABO/RH: ABO/RH(D): A POS

## 2019-12-07 NOTE — Pre-Procedure Instructions (Signed)
Chris Peterson  12/07/2019    Your procedure is scheduled on Thursday, April 22..  Report to [facility], Main Entrance or Entrance "A" at 12:40 P.M.                 Your surgery or procedure is scheduled for 2:40 PM  Call this number if you have problems the morning of surgery:    Remember:  Do not eat  fter midnight.  You may drink clear liquids until 6:30 AM .  Clear liquids allowed are:     Water, Juice (non-citric and without pulp), Carbonated beverages, Clear Tea, Black Coffee only, Plain Jell-O only, Gatorade and Plain Popsicles only    Take these medicines the morning of surgery with A SIP OF WATER: carvedilol (COREG)  rosuvastatin (CRESTOR) tamsulosin (FLOMAX)   STOP taking Aspirin, Aspirin Products (Goody Powder, Excedrin Migraine), Ibuprofen (Advil), Naproxen (Aleve), Vitamins and Herbal Products (ie Fish Oil).  Special instructions:  [place]- Preparing For Surgery  Before surgery, you can play an important role. Because skin is not sterile, your skin needs to be as free of germs as possible. You can reduce the number of germs on your skin by washing with CHG (chlorahexidine gluconate) Soap before surgery.  CHG is an antiseptic cleaner which kills germs and bonds with the skin to continue killing germs even after washing.    Oral Hygiene is also important to reduce your risk of infection.  Remember - BRUSH YOUR TEETH THE MORNING OF SURGERY WITH YOUR REGULAR TOOTHPASTE  Please do not use if you have an allergy to CHG or antibacterial soaps. If your skin becomes reddened/irritated stop using the CHG.  Do not shave (including legs and underarms) for at least 48 hours prior to first CHG shower. It is OK to shave your face.  Please follow these instructions carefully.   1. Shower the NIGHT BEFORE SURGERY and the MORNING OF SURGERY with CHG.   2. If you chose to wash your hair, wash your hair first as usual with your normal shampoo.  3. After you shampoo,  wash your face and private area with the soap you use at home, then rinse your hair and body thoroughly to remove the shampoo and soap 4. Use CHG as you would any other liquid soap. You can apply CHG directly to the skin and wash gently with a scrungie or a clean washcloth.   5. Apply the CHG Soap to your body ONLY FROM THE NECK DOWN.  Do not use on open wounds or open sores. Avoid contact with your eyes, ears, mouth and genitals (private parts).  6. Wash thoroughly, paying special attention to the area where your surgery will be performed.  7. Thoroughly rinse your body with warm water from the neck down.  8. DO NOT shower/wash with your normal soap after using and rinsing off the CHG Soap.  9. Pat yourself dry with a CLEAN TOWEL.  10. Wear CLEAN PAJAMAS to bed the night before surgery, wear comfortable clothes the morning of surgery  11. Place CLEAN SHEETS on your bed the night of your first shower and DO NOT SLEEP WITH PETS.  Day of Surgery: Shower as instructed above. Do not wear lotions, powders, or colognes, or deodorant. Please wear clean clothes to the hospital/surgery center.   Remember to brush your teeth WITH YOUR REGULAR TOOTHPASTE.  Do not wear jewelry, make-up or nail polish.   Men may shave face and neck.  Do not bring valuables to the hospital.  [facility] is not responsible for any belongings or valuables.  Contacts, dentures or bridgework may not be worn into surgery.  Leave your suitcase in the car.  After surgery it may be brought to your room.  For patients admitted to the hospital, discharge time will be determined by your treatment team.  Please read over the following fact sheets that you were given.

## 2019-12-07 NOTE — Progress Notes (Addendum)
PCP - Celene Skeen  Cardiologist - Dr. Sherren Mocha  Chest x-ray - 12/07/2019  EKG - 11/19/2019  Stress Test - no  ECHO - 2021  Cardiac Cath - 10/27/2019  Sleep Study - no CPAP - no  LABS- ABG, CMP, Pt, PTT, T/S U/A, A1C  ASA-na  ERAS-no No Solids after midnight, clears until 0630; Mr Fayne that he could eat until 0630 and clears until surgery. I called Ryan on speaker phone and asked about diet, it is no food after midnight and clears until 0630- patient and his daughter heard information and voiced understanding.  Urine - called to [place] at  Dr. Darcey Nora  HA1C-6.3- on 12/07/2019  Fasting Blood Sugar - has not checked in years Checks Blood Sugar __0___ times a day  Anesthesia-  Pt denies having chest pain, sob, or fever at this time. All instructions explained to the pt, with a verbal understanding of the material. Pt agrees to go over the instructions while at home for a better understanding. Pt also instructed to self quarantine after being tested for COVID-19. The opportunity to ask questions was provided.

## 2019-12-08 ENCOUNTER — Encounter (HOSPITAL_COMMUNITY): Payer: Self-pay

## 2019-12-08 ENCOUNTER — Telehealth: Payer: Self-pay

## 2019-12-08 DIAGNOSIS — M5441 Lumbago with sciatica, right side: Secondary | ICD-10-CM | POA: Diagnosis not present

## 2019-12-08 DIAGNOSIS — N183 Chronic kidney disease, stage 3 unspecified: Secondary | ICD-10-CM | POA: Diagnosis not present

## 2019-12-08 DIAGNOSIS — M5442 Lumbago with sciatica, left side: Secondary | ICD-10-CM | POA: Diagnosis not present

## 2019-12-08 DIAGNOSIS — D631 Anemia in chronic kidney disease: Secondary | ICD-10-CM | POA: Diagnosis not present

## 2019-12-08 DIAGNOSIS — I129 Hypertensive chronic kidney disease with stage 1 through stage 4 chronic kidney disease, or unspecified chronic kidney disease: Secondary | ICD-10-CM | POA: Diagnosis not present

## 2019-12-08 DIAGNOSIS — N3000 Acute cystitis without hematuria: Secondary | ICD-10-CM

## 2019-12-08 DIAGNOSIS — E1122 Type 2 diabetes mellitus with diabetic chronic kidney disease: Secondary | ICD-10-CM | POA: Diagnosis not present

## 2019-12-08 MED ORDER — CIPROFLOXACIN HCL 250 MG PO TABS: 250.0000 mg | ORAL_TABLET | Freq: Two times a day (BID) | ORAL | 0 refills | Status: DC

## 2019-12-08 NOTE — Telephone Encounter (Signed)
RX for Cipro 250 mg po bid #2 no refills Called into [facility]

## 2019-12-08 NOTE — Progress Notes (Signed)
Anesthesia Chart Review:  Case: [id number] Date/Time: 12/09/19 1427   Procedures:      SUBXYPHOID PERICARDIAL WINDOW (N/A )     TRANSESOPHAGEAL ECHOCARDIOGRAM (TEE) (N/A )   Anesthesia type: General   Pre-op diagnosis: RECURRENT PERICARDIAL EFFUSION   Location: [place] OR ROOM 14 / [place] OR   Surgeons: Ivin Poot, MD      DISCUSSION: Patient is an 86 year old male scheduled for the above procedure. He has a "longstanding" history of idiopathic pericardial effusion with worsening symptoms 10/11/99 with evidence of diastolic CHF and enlarged pericardial effusion. Diuretics titrated and s/p RHC/pericardiocentsis 10/27/19.  Cytology negative for malignant cells.  Pericardial effusion recurrent on follow-up echocardiogram.  History includes smoking, HTN, hypercholesterolemia, atrial fib, AAA (5.1 cm 12/02/19 CTA), pericardial effusion (recurrent following pericardiocentesis 10/27/19), prediabetes, renal artery stenosis (s/p left  renal artery stent 11/27/11), CKD stage III.  New findings of afib, rate controlled, at 11/19/19 cardiology office visit. CHA2DS2-VASc=5 (CHF, vascular disease, hypertension, age).  Given large pericardial effusion with referral for pericardial window, decision made not to prescribe anticoagulation therapy. In addition to CT surgery referral, he was also referred to urology for 3.1 cm left renal mass, and vascular surgery for 5 cm AAA.   Presurgical COVID-19 test negative on 12/07/2019.  Anesthesia team to evaluate on the day of surgery.   VS: BP (!) 154/96   Pulse 89   Temp (!) 36.4 C (Oral)   Resp 20   Ht '5\' 9"'$  (1.753 m)   Wt 61.3 kg   SpO2 100%   BMI 19.97 kg/m   PROVIDERS: Lawerance Cruel, MD Sherren Mocha, MD is cardiologist Trula Slade, V. Rock Nephew, MD is vascular surgeon. Last evaluation 12/06/19. He wrote, "AAA:  Maximum aortic diameter is 5.1 cm.  This is a juxta renal aneurysm that will likely require referral to [facility] for a 4 vessel device.  I would like to wait until  his AAA is 5.5 cm before repair.  He is in agreement with this plan.  I will repeat his CTA in 6 months". Nephrologist is with [facility]   LABS: Labs reviewed: Acceptable for surgery.  UA with large leukocytes, negative nitrites--PAT RN called to [employer] at [employer]. (all labs ordered are listed, but only abnormal results are displayed)  Labs Reviewed  GLUCOSE, CAPILLARY - Abnormal; Notable for the following components:      Result Value   Glucose-Capillary 137 (*)    All other components within normal limits  BLOOD GAS, ARTERIAL - Abnormal; Notable for the following components:   pO2, Arterial 135 (*)    Allens test (pass/fail) BRACHIAL ARTERY (*)    All other components within normal limits  CBC - Abnormal; Notable for the following components:   Hemoglobin 12.3 (*)    MCH 25.8 (*)    RDW 18.1 (*)    All other components within normal limits  COMPREHENSIVE METABOLIC PANEL - Abnormal; Notable for the following components:   Glucose, Bld 120 (*)    BUN 28 (*)    Creatinine, Ser 1.35 (*)    Albumin 3.3 (*)    GFR calc non Af Amer 47 (*)    GFR calc Af Amer 54 (*)    All other components within normal limits  URINALYSIS, ROUTINE W REFLEX MICROSCOPIC - Abnormal; Notable for the following components:   APPearance HAZY (*)    Protein, ur 100 (*)    Leukocytes,Ua LARGE (*)    WBC, UA >50 (*)  Bacteria, UA MANY (*)    All other components within normal limits  HEMOGLOBIN A1C - Abnormal; Notable for the following components:   Hgb A1c MFr Bld 6.3 (*)    All other components within normal limits  SURGICAL PCR SCREEN  APTT  PROTIME-INR  TYPE AND SCREEN  ABO/RH     IMAGES: CXR 12/07/19: FINDINGS: The heart size and mediastinal contours are within normal limits. Atherosclerosis of thoracic aorta is noted. No pneumothorax is noted. Right lung is clear. Linear opacity is seen in lingular segment of left upper lobe concerning for scarring or atelectasis. Blunting of  left costophrenic sulcus is noted most consistent with scarring. The visualized skeletal structures are unremarkable. IMPRESSION: - Aortic atherosclerosis. Linear opacity seen in lingular segment of left upper lobe concerning for scarring or atelectasis. - Aortic Atherosclerosis (ICD10-I70.0).  CT chest/abd/pelvis 12/03/19: IMPRESSION: 1. Atherosclerosis of the thoracic aorta without evidence of aneurysm. 2. Redemonstrated aneurysmal abdominal aorta, with severe atherosclerosis and a large burden of mural thrombus. Juxtarenal and infrarenal aneurysm components detailed above are enlarged compared to prior examination dated 08/18/2017, measuring up to 5.1 x 4.9 cm and 4.8 x 4.2 cm respectively. Aortic Atherosclerosis (ICD10-I70.0). 3. Small pericardial effusion, decreased compared to prior examination of the chest dated 10/22/2019. 4. New small left pleural effusion and associated atelectasis or consolidation. 5.  Coronary artery disease. 6. Partially imaged lesion of the superior pole of the left kidney described on prior CT is a nonenhancing, benign intermediate attenuation proteinaceous or hemorrhagic cyst on current examination. 7.  Emphysema (ICD10-J43.9).   EKG: 11/19/19 ([facility]): Afib at 83 bpm, right BBB, low voltage.   CV: Echo 11/19/19: IMPRESSIONS  1. Large pericardial effusion. The pericardial effusion is  circumferential. There is no evidence of cardiac tamponade.  2. Left ventricular ejection fraction, by estimation, is 60 to 65%. The  left ventricle has normal function. There is moderate left ventricular  hypertrophy. Elevated left ventricular end-diastolic pressure.  3. Right ventricular systolic function is mildly reduced. There is normal  pulmonary artery systolic pressure.  4. Left atrial size was severely dilated by visual estimate.  5. Right atrial size was moderately dilated by visual estimate.  6. The mitral valve is abnormal. Mild mitral  valve regurgitation.  7. There is mild dilatation of the aortic root measuring 38 mm.  8. The inferior vena cava is normal in size with greater than 50%  respiratory variability, suggesting right atrial pressure of 3 mmHg.  RHC/pericardiocentesis 10/27/19: 1.  Normal right heart hemodynamics 2.  Successful needle pericardiocentesis removing 750 cc of serous fluid Plan: Limited echo in the short stay area prior to discharge this afternoon. Anticipate repeat echocardiogram in 3 to 4 weeks.   Past Medical History:  Diagnosis Date  . AAA (abdominal aortic aneurysm) ([place])   . Atherosclerotic renal artery stenosis, bilateral (HCC)    Bilateral renal angiography 11/27/11, Right 70% prox, Left 90% prox s/p LRA PTA/Stenting - 6.0x32m ;  12/12/2011 Renal Duplex: Patent LRA stent & > 60% RRA ost stenosis  . Atrial fibrillation ([facility] 11/19/2019  . Blood transfusion    S/P "lost blood due to hemorrhoids"  . Complication of anesthesia 2013   Severe Diarrhea aftter renal artery  . Dyspnea    with effusion  . Dysrhythmia    "fast one time"  . Hemorrhoids   . High cholesterol   . HTN (hypertension)   . Kidney disease, chronic, stage III (moderate, EGFR 30-59 ml/min)    [facility]  .  Pericardial effusion    a. incidentally noted on renal u/s 11/2011;  b.  12/16/2011 Echo:  EF 55-60%, Gr1 DD, Mild MR, PASP 32, Mod circumferential peric effusion w/o hemodynamic compromise. Density in pericardial space - consider MRI  . Pre-diabetes   . Sciatica     Past Surgical History:  Procedure Laterality Date  . ABDOMINAL ANGIOGRAM N/A 11/27/2011   Procedure: ABDOMINAL ANGIOGRAM;  Surgeon: Sherren Mocha, MD;  Location: [facility] C[facility] LAB;  Service: Cardiovascular;  Laterality: N/A;  . BIOPSY  03/13/2018   Procedure: BIOPSY;  Surgeon: Ladene Artist, MD;  Location: [facility] ENDOSCOPY;  Service: Endoscopy;;  . CATARACT EXTRACTION W/ INTRAOCULAR LENS  IMPLANT, BILATERAL  1990's  . ESOPHAGOGASTRODUODENOSCOPY N/A  03/13/2018   Procedure: ESOPHAGOGASTRODUODENOSCOPY (EGD);  Surgeon: Ladene Artist, MD;  Location: [facility];  Service: Endoscopy;  Laterality: N/A;  . HOT HEMOSTASIS N/A 03/13/2018   Procedure: HOT HEMOSTASIS (ARGON PLASMA COAGULATION/BICAP);  Surgeon: Ladene Artist, MD;  Location: [facility] ENDOSCOPY;  Service: Endoscopy;  Laterality: N/A;  . NASAL SEPTUM SURGERY  1980's  . PERICARDIOCENTESIS N/A 10/27/2019   Procedure: PERICARDIOCENTESIS;  Surgeon: Sherren Mocha, MD;  Location: [place] CV LAB;  Service: Cardiovascular;  Laterality: N/A;  . RENAL ANGIOGRAM Bilateral 11/27/2011   Procedure: RENAL ANGIOGRAM;  Surgeon: Sherren Mocha, MD;  Location: [facility] CATH LAB;  Service: Cardiovascular;  Laterality: Bilateral;  possible PTA/stent  . RENAL ARTERY STENT  11/27/11  . RIGHT HEART CATH N/A 10/27/2019   Procedure: RIGHT HEART CATH;  Surgeon: Sherren Mocha, MD;  Location: [place] CV LAB;  Service: Cardiovascular;  Laterality: N/A;    MEDICATIONS: . carvedilol (COREG) 12.5 MG tablet  . cholecalciferol (VITAMIN D) 1000 units tablet  . furosemide (LASIX) 20 MG tablet  . furosemide (LASIX) 40 MG tablet  . lisinopril (ZESTRIL) 10 MG tablet  . loperamide (IMODIUM) 2 MG capsule  . rosuvastatin (CRESTOR) 40 MG tablet  . tamsulosin (FLOMAX) 0.4 MG CAPS capsule   No current facility-administered medications for this encounter.     Myra Gianotti, PA-C Surgical Short Stay/Anesthesiology [facility] Phone [phone] [facility] Phone [phone] 12/08/2019 2:34 PM

## 2019-12-08 NOTE — Anesthesia Preprocedure Evaluation (Addendum)
Anesthesia Evaluation  Patient identified by MRN, date of birth, ID band Patient awake    Reviewed: Allergy & Precautions, NPO status , Patient's Chart, lab work & pertinent test results, reviewed documented beta blocker date and time   Airway Mallampati: III  TM Distance: >3 FB Neck ROM: Full    Dental  (+) Partial Upper, Partial Lower   Pulmonary Current Smoker and Patient abstained from smoking.,    Pulmonary exam normal breath sounds clear to auscultation       Cardiovascular hypertension, Pt. on home beta blockers and Pt. on medications + Peripheral Vascular Disease  Normal cardiovascular exam+ dysrhythmias Atrial Fibrillation  Rhythm:Regular Rate:Normal  ECG: a-fib, RBBB. Rate 83  ECHO: 1. Large pericardial effusion. The pericardial effusion is circumferential. There is no evidence of cardiac tamponade. 2. Left ventricular ejection fraction, by estimation, is 60 to 65%. The left ventricle has normal function. There is moderate left ventricular hypertrophy. Elevated left ventricular enddiastolic pressure. 3. Right ventricular systolic function is mildly reduced. There is normal pulmonary artery systolic pressure. 4. Left atrial size was severely dilated by visual estimate. 5. Right atrial size was moderately dilated by visual estimate. 6. The mitral valve is abnormal. Mild mitral valve regurgitation. 7. There is mild dilatation of the aortic root measuring 38 mm. 8. The inferior vena cava is normal in size with greater than 50% respiratory variability, suggesting right atrial pressure of 3 mmHg.  CATH: 1. Normal right heart hemodynamics 2. Successful needle pericardiocentesis removing 750 cc of serous fluid   Neuro/Psych negative neurological ROS  negative psych ROS   GI/Hepatic Neg liver ROS, PUD,   Endo/Other  negative endocrine ROSPre-DM  Renal/GU Renal disease     Musculoskeletal Sciatica   Abdominal    Peds  Hematology  (+) anemia , HLD   Anesthesia Other Findings RECURRENT PERICARDIAL EFFUSION  Reproductive/Obstetrics                            Anesthesia Physical Anesthesia Plan  ASA: IV  Anesthesia Plan: General   Post-op Pain Management:    Induction: Intravenous  PONV Risk Score and Plan: 1 and Ondansetron, Dexamethasone and Treatment may vary due to age or medical condition  Airway Management Planned: Oral ETT  Additional Equipment: Arterial line, CVP, TEE and Ultrasound Guidance Line Placement  Intra-op Plan:   Post-operative Plan: Extubation in OR and Possible Post-op intubation/ventilation  Informed Consent: I have reviewed the patients History and Physical, chart, labs and discussed the procedure including the risks, benefits and alternatives for the proposed anesthesia with the patient or authorized representative who has indicated his/her understanding and acceptance.     Dental advisory given  Plan Discussed with: CRNA and Anesthesiologist  Anesthesia Plan Comments: (Reviewed PAT note written 12/08/2019 by Myra Gianotti, PA-C. TEE for intraoperative monitoring only)      Anesthesia Quick Evaluation

## 2019-12-09 ENCOUNTER — Inpatient Hospital Stay (HOSPITAL_COMMUNITY)
Admission: RE | Admit: 2019-12-09 | Discharge: 2019-12-13 | DRG: 271 | Disposition: A | Discharge status: Home / Self Care | Payer: MEDICARE | Attending: Cardiothoracic Surgery | Admitting: Cardiothoracic Surgery

## 2019-12-09 ENCOUNTER — Inpatient Hospital Stay (HOSPITAL_COMMUNITY): Payer: MEDICARE

## 2019-12-09 ENCOUNTER — Inpatient Hospital Stay (HOSPITAL_COMMUNITY): Payer: MEDICARE | Admitting: Vascular Surgery

## 2019-12-09 ENCOUNTER — Encounter (HOSPITAL_COMMUNITY): Payer: Self-pay | Admitting: Cardiothoracic Surgery

## 2019-12-09 ENCOUNTER — Other Ambulatory Visit: Payer: Self-pay

## 2019-12-09 ENCOUNTER — Encounter (HOSPITAL_COMMUNITY)
Admission: RE | Disposition: A | Discharge status: Home / Self Care | Payer: Self-pay | Source: Home / Self Care | Attending: Cardiothoracic Surgery

## 2019-12-09 DIAGNOSIS — N189 Chronic kidney disease, unspecified: Secondary | ICD-10-CM | POA: Diagnosis not present

## 2019-12-09 DIAGNOSIS — Z419 Encounter for procedure for purposes other than remedying health state, unspecified: Secondary | ICD-10-CM

## 2019-12-09 DIAGNOSIS — F17210 Nicotine dependence, cigarettes, uncomplicated: Secondary | ICD-10-CM | POA: Diagnosis present

## 2019-12-09 DIAGNOSIS — J9811 Atelectasis: Secondary | ICD-10-CM | POA: Diagnosis not present

## 2019-12-09 DIAGNOSIS — J939 Pneumothorax, unspecified: Secondary | ICD-10-CM

## 2019-12-09 DIAGNOSIS — I1310 Hypertensive heart and chronic kidney disease without heart failure, with stage 1 through stage 4 chronic kidney disease, or unspecified chronic kidney disease: Secondary | ICD-10-CM | POA: Diagnosis present

## 2019-12-09 DIAGNOSIS — E1122 Type 2 diabetes mellitus with diabetic chronic kidney disease: Secondary | ICD-10-CM | POA: Diagnosis present

## 2019-12-09 DIAGNOSIS — Z8719 Personal history of other diseases of the digestive system: Secondary | ICD-10-CM

## 2019-12-09 DIAGNOSIS — D631 Anemia in chronic kidney disease: Secondary | ICD-10-CM | POA: Diagnosis present

## 2019-12-09 DIAGNOSIS — N183 Chronic kidney disease, stage 3 unspecified: Secondary | ICD-10-CM | POA: Diagnosis present

## 2019-12-09 DIAGNOSIS — Z8679 Personal history of other diseases of the circulatory system: Secondary | ICD-10-CM | POA: Diagnosis not present

## 2019-12-09 DIAGNOSIS — I130 Hypertensive heart and chronic kidney disease with heart failure and stage 1 through stage 4 chronic kidney disease, or unspecified chronic kidney disease: Secondary | ICD-10-CM | POA: Diagnosis not present

## 2019-12-09 DIAGNOSIS — Z20822 Contact with and (suspected) exposure to covid-19: Secondary | ICD-10-CM | POA: Diagnosis present

## 2019-12-09 DIAGNOSIS — I313 Pericardial effusion (noninflammatory): Principal | ICD-10-CM | POA: Diagnosis present

## 2019-12-09 DIAGNOSIS — I700 Atherosclerosis of aorta: Secondary | ICD-10-CM | POA: Diagnosis present

## 2019-12-09 DIAGNOSIS — I3139 Other pericardial effusion (noninflammatory): Secondary | ICD-10-CM

## 2019-12-09 DIAGNOSIS — I482 Chronic atrial fibrillation, unspecified: Secondary | ICD-10-CM | POA: Diagnosis not present

## 2019-12-09 DIAGNOSIS — Z4682 Encounter for fitting and adjustment of non-vascular catheter: Secondary | ICD-10-CM | POA: Diagnosis not present

## 2019-12-09 DIAGNOSIS — I701 Atherosclerosis of renal artery: Secondary | ICD-10-CM | POA: Diagnosis present

## 2019-12-09 DIAGNOSIS — E7800 Pure hypercholesterolemia, unspecified: Secondary | ICD-10-CM | POA: Diagnosis not present

## 2019-12-09 DIAGNOSIS — Z79899 Other long term (current) drug therapy: Secondary | ICD-10-CM

## 2019-12-09 DIAGNOSIS — J90 Pleural effusion, not elsewhere classified: Secondary | ICD-10-CM | POA: Diagnosis not present

## 2019-12-09 DIAGNOSIS — I5030 Unspecified diastolic (congestive) heart failure: Secondary | ICD-10-CM | POA: Diagnosis not present

## 2019-12-09 HISTORY — PX: SUBXYPHOID PERICARDIAL WINDOW: SHX5075

## 2019-12-09 HISTORY — PX: TEE WITHOUT CARDIOVERSION: SHX5443

## 2019-12-09 LAB — ECHO INTRAOPERATIVE TEE
Height: 69 in
Weight: 2160 oz

## 2019-12-09 LAB — GLUCOSE, CAPILLARY
Glucose-Capillary: 133 mg/dL — ABNORMAL HIGH (ref 70–99)
Glucose-Capillary: 157 mg/dL — ABNORMAL HIGH (ref 70–99)
Glucose-Capillary: 131 mg/dL — ABNORMAL HIGH (ref 70–99)

## 2019-12-09 LAB — BLOOD GAS, ARTERIAL
Acid-Base Excess: 1.1 mmol/L (ref 0.0–2.0)
Bicarbonate: 26.1 mmol/L (ref 20.0–28.0)
FIO2: 21
O2 Saturation: 88.6 %
Patient temperature: 37
pCO2 arterial: 48.3 mmHg — ABNORMAL HIGH (ref 32.0–48.0)
pH, Arterial: 7.351 (ref 7.350–7.450)
pO2, Arterial: 61.5 mmHg — ABNORMAL LOW (ref 83.0–108.0)

## 2019-12-09 SURGERY — CREATION, PERICARDIAL WINDOW, SUBXIPHOID APPROACH
Anesthesia: General

## 2019-12-09 MED ORDER — 0.9 % SODIUM CHLORIDE (POUR BTL) OPTIME
TOPICAL | Status: DC | PRN
  Administered 2019-12-09: 07:00:00 2000 mL

## 2019-12-09 MED ORDER — LIDOCAINE 2% (20 MG/ML) 5 ML SYRINGE
INTRAMUSCULAR | Status: DC | PRN
  Administered 2019-12-09: 60 mg via INTRAVENOUS

## 2019-12-09 MED ORDER — LABETALOL HCL 5 MG/ML IV SOLN: 10.0000 mg | INTRAVENOUS | Status: DC | PRN

## 2019-12-09 MED ORDER — CEFAZOLIN SODIUM-DEXTROSE 2-4 GM/100ML-% IV SOLN
2.0000 g | INTRAVENOUS | Status: AC
  Administered 2019-12-09: 2 g via INTRAVENOUS

## 2019-12-09 MED ORDER — DEXTROSE-NACL 5-0.45 % IV SOLN: INTRAVENOUS | Status: DC

## 2019-12-09 MED ORDER — DEXAMETHASONE SODIUM PHOSPHATE 10 MG/ML IJ SOLN
INTRAMUSCULAR | Status: DC | PRN
  Administered 2019-12-09: 5 mg via INTRAVENOUS

## 2019-12-09 MED ORDER — CIPROFLOXACIN IN D5W 400 MG/200ML IV SOLN
400.0000 mg | Freq: Two times a day (BID) | INTRAVENOUS | Status: AC
  Administered 2019-12-09 – 2019-12-10 (×2): 400 mg via INTRAVENOUS
  Filled 2019-12-09 (×2): qty 200

## 2019-12-09 MED ORDER — LABETALOL HCL 5 MG/ML IV SOLN
INTRAVENOUS | Status: AC
  Filled 2019-12-09: qty 4

## 2019-12-09 MED ORDER — ROCURONIUM BROMIDE 10 MG/ML (PF) SYRINGE: PREFILLED_SYRINGE | INTRAVENOUS | Status: DC | PRN

## 2019-12-09 MED ORDER — ONDANSETRON HCL 4 MG/2ML IJ SOLN
INTRAMUSCULAR | Status: DC | PRN
  Administered 2019-12-09: 4 mg via INTRAVENOUS

## 2019-12-09 MED ORDER — LABETALOL HCL 5 MG/ML IV SOLN
10.0000 mg | INTRAVENOUS | Status: DC | PRN
  Administered 2019-12-09 – 2019-12-12 (×7): 10 mg via INTRAVENOUS
  Filled 2019-12-09 (×5): qty 4

## 2019-12-09 MED ORDER — LISINOPRIL 10 MG PO TABS
10.0000 mg | ORAL_TABLET | Freq: Every day | ORAL | Status: DC
  Administered 2019-12-09 – 2019-12-11 (×4): 10 mg via ORAL
  Filled 2019-12-09 (×2): qty 1

## 2019-12-09 MED ORDER — SODIUM CHLORIDE (PF) 0.9 % IJ SOLN
INTRAMUSCULAR | Status: AC
  Filled 2019-12-09: qty 20

## 2019-12-09 MED ORDER — CEFAZOLIN SODIUM-DEXTROSE 2-4 GM/100ML-% IV SOLN
2.0000 g | Freq: Three times a day (TID) | INTRAVENOUS | Status: AC
  Administered 2019-12-09 (×2): 2 g via INTRAVENOUS
  Filled 2019-12-09 (×4): qty 100

## 2019-12-09 MED ORDER — TRAMADOL HCL 50 MG PO TABS
50.0000 mg | ORAL_TABLET | Freq: Four times a day (QID) | ORAL | Status: DC | PRN
  Administered 2019-12-09 – 2019-12-12 (×10): 50 mg via ORAL
  Filled 2019-12-09 (×11): qty 1

## 2019-12-09 MED ORDER — LABETALOL HCL 5 MG/ML IV SOLN
5.0000 mg | Freq: Once | INTRAVENOUS | Status: AC
  Administered 2019-12-09: 10:00:00 5 mg via INTRAVENOUS

## 2019-12-09 MED ORDER — PROPOFOL 10 MG/ML IV BOLUS
INTRAVENOUS | Status: AC
  Filled 2019-12-09: qty 20

## 2019-12-09 MED ORDER — VASOPRESSIN 20 UNIT/ML IV SOLN
INTRAVENOUS | Status: AC
  Filled 2019-12-09: qty 1

## 2019-12-09 MED ORDER — ROSUVASTATIN CALCIUM 20 MG PO TABS
40.0000 mg | ORAL_TABLET | Freq: Every day | ORAL | Status: DC
  Administered 2019-12-10 – 2019-12-13 (×4): 40 mg via ORAL
  Filled 2019-12-09 (×4): qty 2

## 2019-12-09 MED ORDER — ENOXAPARIN SODIUM 30 MG/0.3ML ~~LOC~~ SOLN
30.0000 mg | SUBCUTANEOUS | Status: DC
  Administered 2019-12-09 – 2019-12-12 (×4): 30 mg via SUBCUTANEOUS
  Filled 2019-12-09 (×3): qty 0.3

## 2019-12-09 MED ORDER — LISINOPRIL 10 MG PO TABS
10.0000 mg | ORAL_TABLET | Freq: Every day | ORAL | Status: DC
  Filled 2019-12-09: qty 1

## 2019-12-09 MED ORDER — PROPOFOL 10 MG/ML IV BOLUS: INTRAVENOUS | Status: DC | PRN

## 2019-12-09 MED ORDER — DEXAMETHASONE SODIUM PHOSPHATE 10 MG/ML IJ SOLN
INTRAMUSCULAR | Status: AC
  Filled 2019-12-09: qty 1

## 2019-12-09 MED ORDER — PROPOFOL 10 MG/ML IV BOLUS
INTRAVENOUS | Status: DC | PRN
  Administered 2019-12-09: 10 mg via INTRAVENOUS
  Administered 2019-12-09: 90 mg via INTRAVENOUS

## 2019-12-09 MED ORDER — METOCLOPRAMIDE HCL 5 MG/ML IJ SOLN
5.0000 mg | Freq: Four times a day (QID) | INTRAMUSCULAR | Status: DC
  Administered 2019-12-09 – 2019-12-12 (×13): 5 mg via INTRAVENOUS
  Filled 2019-12-09 (×13): qty 2

## 2019-12-09 MED ORDER — ONDANSETRON HCL 4 MG/2ML IJ SOLN
INTRAMUSCULAR | Status: AC
  Filled 2019-12-09: qty 2

## 2019-12-09 MED ORDER — FUROSEMIDE 10 MG/ML IJ SOLN
20.0000 mg | Freq: Every day | INTRAMUSCULAR | Status: DC
  Administered 2019-12-09 – 2019-12-12 (×4): 20 mg via INTRAVENOUS
  Filled 2019-12-09 (×4): qty 2

## 2019-12-09 MED ORDER — PHENYLEPHRINE HCL (PRESSORS) 10 MG/ML IV SOLN
INTRAVENOUS | Status: DC | PRN
  Administered 2019-12-09 (×2): 80 ug via INTRAVENOUS

## 2019-12-09 MED ORDER — ACETAMINOPHEN 500 MG PO TABS
1000.0000 mg | ORAL_TABLET | Freq: Four times a day (QID) | ORAL | Status: DC
  Administered 2019-12-09 – 2019-12-13 (×15): 1000 mg via ORAL
  Filled 2019-12-09 (×16): qty 2

## 2019-12-09 MED ORDER — ROCURONIUM BROMIDE 10 MG/ML (PF) SYRINGE
PREFILLED_SYRINGE | INTRAVENOUS | Status: DC | PRN
  Administered 2019-12-09: 60 mg via INTRAVENOUS

## 2019-12-09 MED ORDER — MIDAZOLAM HCL 2 MG/2ML IJ SOLN
INTRAMUSCULAR | Status: AC
  Filled 2019-12-09: qty 2

## 2019-12-09 MED ORDER — FENTANYL CITRATE (PF) 100 MCG/2ML IJ SOLN: 25.0000 ug | INTRAMUSCULAR | Status: DC | PRN

## 2019-12-09 MED ORDER — FENTANYL CITRATE (PF) 250 MCG/5ML IJ SOLN
INTRAMUSCULAR | Status: DC | PRN
  Administered 2019-12-09 (×4): 50 ug via INTRAVENOUS

## 2019-12-09 MED ORDER — ACETAMINOPHEN 160 MG/5ML PO SOLN: 1000.0000 mg | Freq: Four times a day (QID) | ORAL | Status: DC

## 2019-12-09 MED ORDER — FENTANYL CITRATE (PF) 250 MCG/5ML IJ SOLN
INTRAMUSCULAR | Status: AC
  Filled 2019-12-09: qty 5

## 2019-12-09 MED ORDER — TAMSULOSIN HCL 0.4 MG PO CAPS: 0.4000 mg | ORAL_CAPSULE | Freq: Every day | ORAL | Status: DC

## 2019-12-09 MED ORDER — PHENYLEPHRINE HCL-NACL 10-0.9 MG/250ML-% IV SOLN
INTRAVENOUS | Status: AC
  Filled 2019-12-09: qty 250

## 2019-12-09 MED ORDER — BISACODYL 5 MG PO TBEC
10.0000 mg | DELAYED_RELEASE_TABLET | Freq: Every day | ORAL | Status: DC
  Administered 2019-12-10 – 2019-12-12 (×3): 10 mg via ORAL
  Filled 2019-12-09 (×3): qty 2

## 2019-12-09 MED ORDER — CIPROFLOXACIN IN D5W 400 MG/200ML IV SOLN
INTRAVENOUS | Status: DC | PRN
  Administered 2019-12-09: 400 mg via INTRAVENOUS

## 2019-12-09 MED ORDER — ACETAMINOPHEN 10 MG/ML IV SOLN: 1000.0000 mg | Freq: Once | INTRAVENOUS | Status: DC | PRN

## 2019-12-09 MED ORDER — SODIUM CHLORIDE 0.9 % IV SOLN: INTRAVENOUS | Status: DC | PRN

## 2019-12-09 MED ORDER — SENNOSIDES-DOCUSATE SODIUM 8.6-50 MG PO TABS
1.0000 | ORAL_TABLET | Freq: Every day | ORAL | Status: DC
  Administered 2019-12-09 – 2019-12-11 (×3): 1 via ORAL
  Filled 2019-12-09 (×3): qty 1

## 2019-12-09 MED ORDER — SUGAMMADEX SODIUM 200 MG/2ML IV SOLN
INTRAVENOUS | Status: DC | PRN
  Administered 2019-12-09: 250 mg via INTRAVENOUS

## 2019-12-09 MED ORDER — LACTATED RINGERS IV SOLN: INTRAVENOUS | Status: DC | PRN

## 2019-12-09 MED ORDER — CEFAZOLIN SODIUM-DEXTROSE 2-4 GM/100ML-% IV SOLN
INTRAVENOUS | Status: AC
  Administered 2019-12-10: 09:00:00 2 g via INTRAVENOUS
  Filled 2019-12-09: qty 100

## 2019-12-09 MED ORDER — MIDAZOLAM HCL 2 MG/2ML IJ SOLN
INTRAMUSCULAR | Status: DC | PRN
  Administered 2019-12-09: 1 mg via INTRAVENOUS

## 2019-12-09 MED ORDER — ONDANSETRON HCL 4 MG/2ML IJ SOLN: 4.0000 mg | Freq: Four times a day (QID) | INTRAMUSCULAR | Status: DC | PRN

## 2019-12-09 MED ORDER — CARVEDILOL 12.5 MG PO TABS
12.5000 mg | ORAL_TABLET | Freq: Two times a day (BID) | ORAL | Status: DC
  Administered 2019-12-09 – 2019-12-10 (×2): 12.5 mg via ORAL
  Filled 2019-12-09 (×3): qty 1

## 2019-12-09 MED ORDER — ONDANSETRON HCL 4 MG/2ML IJ SOLN: 4.0000 mg | Freq: Once | INTRAMUSCULAR | Status: DC | PRN

## 2019-12-09 SURGICAL SUPPLY — 58 items
ADH SKN CLS APL DERMABOND .7 (GAUZE/BANDAGES/DRESSINGS) ×1
APL SKNCLS STERI-STRIP NONHPOA (GAUZE/BANDAGES/DRESSINGS)
BENZOIN TINCTURE PRP APPL 2/3 (GAUZE/BANDAGES/DRESSINGS) ×1 IMPLANT
BLADE CLIPPER SURG (BLADE) ×1 IMPLANT
BLADE STERNUM SYSTEM 6 (BLADE) ×2 IMPLANT
BLADE SURG SZ11 CARB STEEL (BLADE) ×2 IMPLANT
CANISTER SUCT 3000ML PPV (MISCELLANEOUS) ×3 IMPLANT
CATH THORACIC 28FR (CATHETERS) IMPLANT
CATH THORACIC 28FR RT ANG (CATHETERS) IMPLANT
CATH THORACIC 36FR (CATHETERS) IMPLANT
CLIP VESOCCLUDE MED 6/CT (CLIP) ×2 IMPLANT
CLIP VESOCCLUDE SM WIDE 6/CT (CLIP) ×2 IMPLANT
CLOSURE WOUND 1/2 X4 (GAUZE/BANDAGES/DRESSINGS)
CNTNR URN SCR LID CUP LEK RST (MISCELLANEOUS) IMPLANT
CONN ST 1/4X3/8  BEN (MISCELLANEOUS) ×3
CONN ST 1/4X3/8 BEN (MISCELLANEOUS) IMPLANT
CONT SPEC 4OZ STRL OR WHT (MISCELLANEOUS) ×6
COVER SURGICAL LIGHT HANDLE (MISCELLANEOUS) ×2 IMPLANT
DERMABOND ADVANCED (GAUZE/BANDAGES/DRESSINGS) ×2
DERMABOND ADVANCED .7 DNX12 (GAUZE/BANDAGES/DRESSINGS) IMPLANT
DRAIN CHANNEL 28F RND 3/8 FF (WOUND CARE) ×5 IMPLANT
DRAPE HALF SHEET 40X57 (DRAPES) ×1 IMPLANT
DRAPE LAPAROSCOPIC ABDOMINAL (DRAPES) ×3 IMPLANT
ELECT BLADE 6.5 EXT (BLADE) ×2 IMPLANT
ELECT REM PT RETURN 9FT ADLT (ELECTROSURGICAL) ×6
ELECTRODE REM PT RTRN 9FT ADLT (ELECTROSURGICAL) ×1 IMPLANT
GAUZE SPONGE 4X4 12PLY STRL (GAUZE/BANDAGES/DRESSINGS) ×2 IMPLANT
GLOVE BIO SURGEON STRL SZ7.5 (GLOVE) ×6 IMPLANT
GLOVE BIOGEL PI IND STRL 6.5 (GLOVE) IMPLANT
GLOVE BIOGEL PI INDICATOR 6.5 (GLOVE) ×4
GLOVE SURG SS PI 7.5 STRL IVOR (GLOVE) ×2 IMPLANT
HEMOSTAT POWDER SURGIFOAM 1G (HEMOSTASIS) IMPLANT
KIT BASIN OR (CUSTOM PROCEDURE TRAY) ×3 IMPLANT
KIT TURNOVER KIT B (KITS) ×3 IMPLANT
NS IRRIG 1000ML POUR BTL (IV SOLUTION) ×5 IMPLANT
PACK CHEST (CUSTOM PROCEDURE TRAY) ×3 IMPLANT
PAD ARMBOARD 7.5X6 YLW CONV (MISCELLANEOUS) ×2 IMPLANT
PAD ELECT DEFIB RADIOL ZOLL (MISCELLANEOUS) ×3 IMPLANT
STRIP CLOSURE SKIN 1/2X4 (GAUZE/BANDAGES/DRESSINGS) ×1 IMPLANT
SUT SILK 2 0 SH CR/8 (SUTURE) ×3 IMPLANT
SUT VIC AB 1 CTX 18 (SUTURE) ×3 IMPLANT
SUT VIC AB 1 CTX 36 (SUTURE) ×3
SUT VIC AB 1 CTX36XBRD ANBCTR (SUTURE) ×1 IMPLANT
SUT VIC AB 2-0 CTX 36 (SUTURE) ×2 IMPLANT
SUT VIC AB 3-0 X1 27 (SUTURE) ×3 IMPLANT
SWAB COLLECTION DEVICE MRSA (MISCELLANEOUS) ×2 IMPLANT
SWAB CULTURE ESWAB REG 1ML (MISCELLANEOUS) ×2 IMPLANT
SYR 10ML LL (SYRINGE) IMPLANT
SYR 50ML SLIP (SYRINGE) IMPLANT
SYSTEM SAHARA CHEST DRAIN ATS (WOUND CARE) ×2 IMPLANT
TAPE CLOTH SURG 4X10 WHT LF (GAUZE/BANDAGES/DRESSINGS) ×2 IMPLANT
TOWEL GREEN STERILE (TOWEL DISPOSABLE) ×3 IMPLANT
TOWEL GREEN STERILE FF (TOWEL DISPOSABLE) ×3 IMPLANT
TRAP SPECIMEN MUCOUS 40CC (MISCELLANEOUS) IMPLANT
TRAP SPECIMEN MUCUS 40CC (MISCELLANEOUS) ×2 IMPLANT
TRAY FOLEY MTR SLVR 14FR STAT (SET/KITS/TRAYS/PACK) ×1 IMPLANT
TRAY FOLEY SLVR 16FR TEMP STAT (SET/KITS/TRAYS/PACK) ×3 IMPLANT
WATER STERILE IRR 1000ML POUR (IV SOLUTION) ×6 IMPLANT

## 2019-12-09 NOTE — Anesthesia Postprocedure Evaluation (Signed)
Anesthesia Post Note  Patient: Chris Peterson  Procedure(s) Performed: SUBXYPHOID PERICARDIAL WINDOW (N/A ) TRANSESOPHAGEAL ECHOCARDIOGRAM (TEE) (N/A )     Patient location during evaluation: PACU Anesthesia Type: General Level of consciousness: awake Pain management: pain level controlled Vital Signs Assessment: post-procedure vital signs reviewed and stable Respiratory status: spontaneous breathing, nonlabored ventilation, respiratory function stable and patient connected to nasal cannula oxygen Cardiovascular status: blood pressure returned to baseline and stable Postop Assessment: no apparent nausea or vomiting Anesthetic complications: no    Last Vitals:  Vitals:   12/09/19 1400 12/09/19 1500  BP:    Pulse: 81 83  Resp: 13 (!) 9  Temp:    SpO2: 95% 97%    Last Pain:  Vitals:   12/09/19 1005  PainSc: 0-No pain                 Cedar Roseman P Tahja Liao

## 2019-12-09 NOTE — Progress Notes (Signed)
Pre Procedure note for inpatients:   Chris Peterson has been scheduled for Procedure(s): SUBXYPHOID PERICARDIAL WINDOW (N/A) TRANSESOPHAGEAL ECHOCARDIOGRAM (TEE) (N/A) today. The various methods of treatment have been discussed with the patient. After consideration of the risks, benefits and treatment options the patient has consented to the planned procedure.   The patient has been seen and labs reviewed. There are no changes in the patient's condition to prevent proceeding with the planned procedure today.  Recent labs:  Lab Results  Component Value Date   WBC 7.1 12/07/2019   HGB 12.3 (L) 12/07/2019   HCT 39.6 12/07/2019   PLT 174 12/07/2019   GLUCOSE 120 (H) 12/07/2019   ALT 17 12/07/2019   AST 28 12/07/2019   NA 142 12/07/2019   K 4.1 12/07/2019   CL 107 12/07/2019   CREATININE 1.35 (H) 12/07/2019   BUN 28 (H) 12/07/2019   CO2 22 12/07/2019   TSH 1.21 12/25/2011   INR 1.2 12/07/2019   HGBA1C 6.3 (H) 12/07/2019    Len Childs, MD 12/09/2019 7:34 AM

## 2019-12-09 NOTE — Anesthesia Procedure Notes (Signed)
Procedure Name: Intubation Date/Time: 12/09/2019 7:52 AM Performed by: Janace Litten, CRNA Pre-anesthesia Checklist: Patient identified, Emergency Drugs available, Suction available and Patient being monitored Patient Re-evaluated:Patient Re-evaluated prior to induction Oxygen Delivery Method: Circle System Utilized Preoxygenation: Pre-oxygenation with 100% oxygen Induction Type: IV induction Ventilation: Mask ventilation without difficulty Laryngoscope Size: Mac and 3 Grade View: Grade II Tube type: Oral Tube size: 7.5 mm Number of attempts: 1 Airway Equipment and Method: Stylet and Oral airway Placement Confirmation: ETT inserted through vocal cords under direct vision,  positive ETCO2 and breath sounds checked- equal and bilateral Tube secured with: Tape Dental Injury: Teeth and Oropharynx as per pre-operative assessment  Comments: Inserted by Clyde Lundborg

## 2019-12-09 NOTE — Progress Notes (Signed)
     [address]       [place], [address]             [phone]       No events Some pain at incision HTN, increasing BP medication  Arik Husmann O Lynnet Hefley

## 2019-12-09 NOTE — Anesthesia Procedure Notes (Addendum)
Central Venous Catheter Insertion Performed by: Belinda Block, MD, anesthesiologist Start/End4/22/2021 6:45 AM, 12/09/2019 7:00 AM Patient location: Pre-op. Preanesthetic checklist: patient identified, IV checked, site marked, risks and benefits discussed, surgical consent, monitors and equipment checked, pre-op evaluation, timeout performed and anesthesia consent Lidocaine 1% used for infiltration and patient sedated Hand hygiene performed  and maximum sterile barriers used  Catheter size: 8 Fr Total catheter length 16. Central line was placed.Double lumen Procedure performed using ultrasound guided technique. Ultrasound Notes:image(s) printed for medical record Attempts: 1 Following insertion, dressing applied and line sutured. Post procedure assessment: blood return through all ports  Patient tolerated the procedure well with no immediate complications.

## 2019-12-09 NOTE — Transfer of Care (Signed)
Immediate Anesthesia Transfer of Care Note  Patient: Chris Peterson  Procedure(s) Performed: SUBXYPHOID PERICARDIAL WINDOW (N/A ) TRANSESOPHAGEAL ECHOCARDIOGRAM (TEE) (N/A )  Patient Location: PACU  Anesthesia Type:General  Level of Consciousness: awake, alert  and patient cooperative  Airway & Oxygen Therapy: Patient Spontanous Breathing  Post-op Assessment: Report given to RN and Post -op Vital signs reviewed and stable  Post vital signs: Reviewed and stable  Last Vitals:  Vitals Value Taken Time  BP 143/98 12/09/19 0905  Temp    Pulse 76 12/09/19 0909  Resp 21 12/09/19 0909  SpO2 91 % 12/09/19 0909  Vitals shown include unvalidated device data.  Last Pain:  Vitals:   12/09/19 0610  PainSc: 0-No pain      Patients Stated Pain Goal: 3 ([id number])  Complications: No apparent anesthesia complications

## 2019-12-09 NOTE — Op Note (Signed)
NAME: Chris Peterson, Chris Peterson MEDICAL RECORD [id number] ACCOUNT [id number] DATE OF BIRTH:11/30/1929 FACILITY: [facility] LOCATION: [facility] PHYSICIAN:Pat Sires VAN TRIGT III, MD  OPERATIVE REPORT  DATE OF PROCEDURE:  12/09/2019  OPERATION:  Subxiphoid pericardial window.  PREOPERATIVE DIAGNOSIS:  Recurrent pericardial effusion, pericarditis.  POSTOPERATIVE DIAGNOSIS:  Recurrent pericardial effusion, pericarditis.  SURGEON:  Ivin Poot, MD  ASSISTANT:  Enid Cutter, PA-C  ANESTHESIA:  General by Dr. Carollee Sires  DESCRIPTION OF PROCEDURE:  After preoperative consent was documented in the short stay holding area and final issues and questions regarding the procedure were discussed with the patient the patient was brought to the operating room.  He was placed  supine on the operating table and general anesthesia was induced with A-line and central line monitoring.  He remained stable.  A transesophageal echo probe was placed by the anesthesia team, which documented the presence of a moderate pericardial  effusion.  There were no other abnormalities other than his chronic atrial fibrillation.  The patient's chest was prepped and draped as a sterile field.  A proper time-out was performed.  A small incision was made centered on the xiphoid.  The xiphoid was removed.  The sternal elevating retractor was placed and the soft tissue between the sternum and the pericardium was gently cleared away.  An incision was made in the pericardium and 240  mL of serosanguineous fluid was drained.  The pericardial tissue was mildly thickened and minimally inflamed.  A 3 cm window of the anterior pericardium was removed.  Tissue was sent for culture as well as for pathology.  The surface of the heart, the epicardium, was inspected and was without nodularity or adhesions.  The surface was very smooth with a thin layer of tissue over the heart because it had been under the pericardial fluid for such a long time.   The normal  fatty contour and the coronary vessels were obscured by that process.  After the fluid was drained the pericardial space was irrigated with sterile saline.  A 28-French Bard fluted soft catheter was placed dependently and pericardium, brought out through separate incision, secured to the skin.  The retractor was removed.  The fascia was closed with interrupted #1 Vicryl.  The subcutaneous layer was closed with a running 2-0 Vicryl and skin was closed with a subcuticular.  Sterile dressings were applied and the chest tube was connected to an underwater seal Pleur-Evac  drainage system.  The patient was then extubated by the anesthesia team in the OR and transported to the recovery room.  He was stable.  CN/NUANCE  D:12/09/2019 T:12/09/2019 JOB:[id number]

## 2019-12-09 NOTE — Progress Notes (Signed)
Day of Surgery Procedure(s) (LRB): SUBXYPHOID PERICARDIAL WINDOW (N/A) TRANSESOPHAGEAL ECHOCARDIOGRAM (TEE) (N/A) Subjective:  Patient resting in ICU after subxiphoid pericardial window Serosanguineous fluid 240 cc drained Patient in chronic atrial fibrillation, no preoperative anticoagulation Patient hypertensive and has as needed IV labetalol ordered  Objective: Vital signs in last 24 hours: Temp:  [97.5 F (36.4 C)-98.1 F (36.7 C)] 97.7 F (36.5 C) (04/22 1138) Pulse Rate:  [76-92] 82 (04/22 1005) Cardiac Rhythm: Atrial fibrillation (04/22 1005) Resp:  [12-20] 14 (04/22 1005) BP: (143-176)/(93-115) 171/115 (04/22 1138) SpO2:  [90 %-98 %] 96 % (04/22 1005) Arterial Line BP: (187-195)/(84-92) 190/86 (04/22 1005) Weight:  [61.2 kg] 61.2 kg (04/22 0557)  Hemodynamic parameters for last 24 hours:   Atrial fib rate controlled Intake/Output from previous day: No intake/output data recorded. Intake/Output this shift: Total I/O In: 1000 [I.V.:1000] Out: 335 [Urine:275; Chest Tube:60]  Neuro intact Lungs clear Pericardial drain secure to Pleur-evac with minimal output  Lab Results: Recent Labs    12/07/19 1144  WBC 7.1  HGB 12.3*  HCT 39.6  PLT 174   BMET:  Recent Labs    12/07/19 1144  NA 142  K 4.1  CL 107  CO2 22  GLUCOSE 120*  BUN 28*  CREATININE 1.35*  CALCIUM 9.0    PT/INR:  Recent Labs    12/07/19 1144  LABPROT 14.7  INR 1.2   ABG    Component Value Date/Time   PHART 7.351 12/09/2019 0910   HCO3 26.1 12/09/2019 0910   TCO2 27 10/27/2019 1229   O2SAT 88.6 12/09/2019 0910   CBG (last 3)  Recent Labs    12/07/19 1001 12/09/19 0606 12/09/19 0912  GLUCAP 137* 133* 157*    Assessment/Plan: S/P Procedure(s) (LRB): SUBXYPHOID PERICARDIAL WINDOW (N/A) TRANSESOPHAGEAL ECHOCARDIOGRAM (TEE) (N/A) Observe in ICU, remove A-line after a.m. labs drawn   LOS: 0 days    Chris Peterson 12/09/2019

## 2019-12-09 NOTE — OR Nursing (Signed)
Dr. Nils Pyle reviewed chest X-Ray everything looks good, patient to transfer to PACU.

## 2019-12-09 NOTE — OR Nursing (Signed)
Radiology in OR to perform chest X-Ray. Patient tolerated well.

## 2019-12-09 NOTE — Progress Notes (Addendum)
  Echocardiogram Echocardiogram Transesophageal has been performed.  Breann Losano A Ivelis Norgard 12/09/2019, 9:43 AM

## 2019-12-09 NOTE — Discharge Summary (Addendum)
Physician Discharge Summary       [address]       [place] [address]             [phone]    Patient ID: Chris Peterson MRN: [id number] DOB/AGE: 84-Jul-1931 84 y.o.  Admit date: 12/09/2019 Discharge date: 12/13/2019  Admission Diagnoses:  Pericardial effusion  Discharge Diagnoses:  1. S/p subxiphoid pericardial window 2. History of AAA (abdominal aortic aneurysm) ([facility]) 3. History of atherosclerotic renal artery stenosis, bilateral ([place]) 4. History of Atrial fibrillation ([place]) 5. History of high cholesterol 6. History of HTN (hypertension) 7. History of Kidney disease, chronic, stage III (moderate, EGFR 30-59 ml/min) 8. History of pre-diabetes 9. History of sciatica  Consults: None  Procedure (s):  Subxiphoid pericardial window and TEE (TRANSESOPHAGEAL ECHOCARDIOGRAM) on 12/09/2019 by Dr. Prescott Gum.  Pathology: PERICARDIUM, BIOPSY:  - Membranous fibroconnective tissue with mild chronic inflammation,  Benign.  History of Presenting Illness: This is an 86 year old patient presents for evaluation of recurrent pericardial effusion.  In early March he was evaluated for shortness of breath and found to have large pericardial effusion.  Patient's cardiac history is positive for atrial fibrillation on no anticoagulation due to history of GI bleeding as well as a 5 cm abdominal aneurysm.  He had no significant valvular disease or ventricular dysfunction.  He had an outpatient pericardiocentesis by Dr. Burt Knack at 750 cc of serous fluid.  Right heart cath pressures and cardiac output were normal.  His symptoms improved.   10 days ago the patient had a follow-up cardiac echo which showed some recurrence of the effusion but less than 2 cm diameter.  The past few days the patient has had noted some increasing shortness of breath with his usual activities.  No orthopnea PND ankle edema or abdominal edema.  The patient has been recommended by his cardiologist for  subxiphoid pericardial window for recurrent pericardial effusion.   Pericardial fluid showed no evidence of infection or malignancy.  Inflammatory mesenchymal cells were noted.   Patient generally is in good health.  He lives with his wife and functions normally.  He is not been hospitalized for several years since he had the upper GI bleeding.  His chest CT scan in early March shows some calcification of the thoracic aorta but no aneurysmal dilatation.  No pleural effusion was noted.  Lung windows are clear.  Brief Hospital Course:  He remained afebrile and hemodynamically stable.He was hypertensive and given IV Labetalol. A line and foley were removed on post op day two and IVF were stopped. He was restarted on Carvedilol 12.5 mg bid and Lisinopril 10 mg daily for hypertension. He was felt surgically stable for transfer from the ICU to [facility] on 04/23. His systolic blood pressure was still in the 160's + so Lisinopril was increased to 20 mg daily and Carvedilol was increased to 18.75 mg bid. Patient was instructed to make a follow up appointment with his medical doctor 1-2 weeks for blood pressure management. Pericardial fluid output gradually decreased and drain was removed on 04/25. Pericardial biopsy showed chronic inflammation. Final chest x ray showed no pneumothorax, small left pleural effusion. Pericardial drain suture will be removed in the office after discharge. Per Dr. Prescott Gum, patient is surgically stable for discharge today.   Latest Vital Signs: Blood pressure (!) 163/94, pulse 90, temperature 97.6 F (36.4 C), temperature source Axillary, resp. rate 18, height '5\' 9"'$  (1.753 m), weight 61.2 kg, SpO2 95 %.  Physical  Exam: Cardiovascular: IRRR IRRR Pulmonary: Clear to auscultation bilaterally Abdomen: Soft, non tender, bowel sounds present. Extremities: No lower extremity edema. Wound: Clean and dry.  No erythema or signs of infection.  Discharge Condition: Stable and discharged to  home.  Recent laboratory studies:  Lab Results  Component Value Date   WBC 9.8 12/11/2019   HGB 11.0 (L) 12/11/2019   HCT 35.7 (L) 12/11/2019   MCV 82.3 12/11/2019   PLT 143 (L) 12/11/2019   Lab Results  Component Value Date   NA 138 12/13/2019   K 3.6 12/13/2019   CL 101 12/13/2019   CO2 25 12/13/2019   CREATININE 1.39 (H) 12/13/2019   GLUCOSE 128 (H) 12/13/2019      Diagnostic Studies: DG Chest 2 View  Result Date: 12/13/2019 CLINICAL DATA:  86 year old male with a history of pneumothorax EXAM: CHEST - 2 VIEW COMPARISON:  12/12/2019 FINDINGS: Cardiomediastinal silhouette unchanged in size and contour with borderline cardiomegaly, not significantly changed from the comparison chest x-rays. Calcifications of the aortic arch. Blunting of the left costophrenic angle with opacity in the costophrenic sulcus. No pneumothorax.  No confluent airspace disease. Mediastinal drain not visualized. EKG leads overlie the chest. Degenerative changes of the spine. IMPRESSION: Small left pleural effusion with associated atelectasis. Interval removal of the mediastinal drain. No pneumothorax. Electronically Signed   By: Corrie Mckusick D.O.   On: 12/13/2019 08:17   DG Chest 2 View  Result Date: 12/07/2019 CLINICAL DATA:  Preop for thoracic surgery. EXAM: CHEST - 2 VIEW COMPARISON:  Dec 25, 2011. FINDINGS: The heart size and mediastinal contours are within normal limits. Atherosclerosis of thoracic aorta is noted. No pneumothorax is noted. Right lung is clear. Linear opacity is seen in lingular segment of left upper lobe concerning for scarring or atelectasis. Blunting of left costophrenic sulcus is noted most consistent with scarring. The visualized skeletal structures are unremarkable. IMPRESSION: Aortic atherosclerosis. Linear opacity seen in lingular segment of left upper lobe concerning for scarring or atelectasis. Aortic Atherosclerosis (ICD10-I70.0). Electronically Signed   By: Marijo Conception M.D.    On: 12/07/2019 16:36   US Renal  Result Date: 11/18/2019 CLINICAL DATA:  Renal lesion EXAM: RENAL / URINARY TRACT ULTRASOUND COMPLETE COMPARISON:  CT chest 10/22/2019 FINDINGS: Right Kidney: Renal measurements: 8.9 x 4.1 x 4.1 cm = volume: 77.9 mL. Echogenic cortex with cortical thinning. No hydronephrosis or mass. Left Kidney: Renal measurements: 11.5 x 6 x 5 cm = volume: 181.3 mL. Echogenic cortex. No hydronephrosis. 3.1 x 2.8 x 3.4 cm hypoechoic mass at the upper pole corresponding to CT abnormality, does not appear cystic. Bladder: The bladder is nearly empty. Other: Mildly prominent prostate with calcification. Incidental note made of left pleural effusion. IMPRESSION: 1. Echogenic kidneys consistent with medical renal disease. No hydronephrosis. Slightly small right kidney with cortical thinning suggesting atrophy 2. 3.1 cm hypoechoic mass off the upper pole of the left kidney, does not appear cystic and is concerning for solid renal mass. 3. Incidental note made of left pleural effusion Electronically Signed   By: Donavan Foil M.D.   On: 11/18/2019 16:51   US AORTA  Result Date: 11/18/2019 CLINICAL DATA:  History of aneurysm EXAM: ULTRASOUND OF ABDOMINAL AORTA TECHNIQUE: Ultrasound examination of the abdominal aorta and proximal common iliac arteries was performed to evaluate for aneurysm. Additional color and Doppler images of the distal aorta were obtained to document patency. COMPARISON:  CT 10/22/2019, 08/18/2017 FINDINGS: Abdominal aortic measurements as follows: Proximal:  4.4 cm  AP x 5.5 cm transverse cm Mid:  3.4 cm AP x 3.9 cm transverse cm Distal:  3.9 cm AP x 5.2 cm transverse cm Patent: Yes, peak systolic velocity is 25.9 cm/s distally Right common iliac artery: 1.4 cm AP x 1.8 cm transverse cm Left common iliac artery: 1.4 cm AP x 1.4 cm transverse cm IMPRESSION: Abdominal aortic aneurysm with maximum diameter of 5.5 cm at the proximal aorta. Recommend followup by abdomen and pelvis CTA in  3-6 months, and vascular surgery referral/consultation if not already obtained. This recommendation follows ACR consensus guidelines: White Paper of the ACR Incidental Findings Committee II on Vascular Findings. J Am Coll Radiol 2013; 10:789-794. Electronically Signed   By: Donavan Foil M.D.   On: 11/18/2019 17:00   DG CHEST PORT 1 VIEW  Result Date: 12/12/2019 CLINICAL DATA:  Subxiphoid pericardial window. EXAM: PORTABLE CHEST 1 VIEW COMPARISON:  December 11, 2019 FINDINGS: Small right effusion with atelectasis. Small left effusion with atelectasis. Stable left retrocardiac opacity. The cardiac silhouette remains enlarged. The hila and mediastinum are normal. No pneumothorax. No nodules or masses. No other abnormalities. IMPRESSION: 1. Enlargement of the cardiac silhouette is stable. 2. Small pleural effusions with underlying opacities, likely atelectasis. 3. Some sort of drain overlies the lower medial left chest, projected over the cardiac silhouette. Electronically Signed   By: Dorise Bullion III M.D   On: 12/12/2019 13:56   DG CHEST PORT 1 VIEW  Result Date: 12/11/2019 CLINICAL DATA:  86 year old male with history of chest tube. EXAM: PORTABLE CHEST 1 VIEW COMPARISON:  Chest x-ray 12/10/2019. FINDINGS: Previously noted right internal jugular catheter has been removed. Left-sided chest tube with tip projecting over the medial aspect of the lower left hemithorax. Opacification in the base of the left hemithorax may reflect areas of atelectasis and/or consolidation in the left lower lobe. Small left pleural effusion. No appreciable pneumothorax. No evidence of pulmonary edema. Heart size is borderline enlarged. Upper mediastinal contours are within normal limits. Aortic atherosclerosis. IMPRESSION: 1. Stable position of left chest tube with no pneumothorax. Small left pleural effusion with persistent atelectasis and/or consolidation in the left lower lobe. 2. Aortic atherosclerosis. Electronically Signed    By: Vinnie Langton M.D.   On: 12/11/2019 10:58   DG Chest Port 1 View  Result Date: 12/10/2019 CLINICAL DATA:  86 year old male with pericardial effusion. EXAM: PORTABLE CHEST 1 VIEW COMPARISON:  Portable chest 12/09/2019 and earlier. FINDINGS: Portable AP semi upright view at 0601 hours. Extubated. Stable right IJ central line and pericardial or mediastinal tube projecting at the level of the diaphragm. Improved lung volumes, resolved pulmonary vascular congestion and improved ventilation. There remains dense left lung base opacity. Stable cardiac size and mediastinal contours. Calcified aortic atherosclerosis. No areas of worsening ventilation. IMPRESSION: 1. Extubated with improved lung volumes and resolved vascular congestion. 2. Continued dense left lung base opacity. Stable mediastinal contours. Electronically Signed   By: Genevie Ann M.D.   On: 12/10/2019 09:33   DG Chest Portable 1 View  Result Date: 12/09/2019 CLINICAL DATA:  Status post thoracic surgery. EXAM: PORTABLE CHEST 1 VIEW COMPARISON:  December 07, 2019. FINDINGS: Stable cardiomediastinal silhouette. Atherosclerosis of thoracic aorta is noted. Endotracheal tube is in good position. Right internal jugular catheter is noted with tip in expected position of the SVC. No pneumothorax is noted. Right lung is clear. Mild left basilar atelectasis is noted with probable left lingular subsegmental atelectasis. Bony thorax is unremarkable. IMPRESSION: Endotracheal tube in good position. Mild left  basilar atelectasis is noted with probable left lingular subsegmental atelectasis. Aortic Atherosclerosis (ICD10-I70.0). Electronically Signed   By: Marijo Conception M.D.   On: 12/09/2019 09:19   CT ANGIO CHEST AORTA W/CM & OR WO/CM  Result Date: 12/03/2019 CLINICAL DATA:  Aortic aneurysm surveillance EXAM: CT ANGIOGRAPHY CHEST, ABDOMEN AND PELVIS TECHNIQUE: Non-contrast CT of the chest was initially obtained. Multidetector CT imaging through the chest,  abdomen and pelvis was performed using the standard protocol during bolus administration of intravenous contrast. Multiplanar reconstructed images and MIPs were obtained and reviewed to evaluate the vascular anatomy. CONTRAST:  64m OMNIPAQUE IOHEXOL 350 MG/ML SOLN COMPARISON:  CT chest, 10/22/2019, CT abdomen pelvis, 08/18/2017, MR abdomen, 11/12/2011 FINDINGS: CTA CHEST FINDINGS Cardiovascular: Preferential opacification of the thoracic aorta. Aortic valve calcifications. Aortic atherosclerosis with irregular mural thrombus. Normal contour and caliber of the thoracic aorta. Normal heart size. Extensive 3 vessel coronary artery calcifications and/or stents. Small pericardial effusion, decreased compared to prior examination dated 10/22/2019. Mediastinum/Nodes: No enlarged mediastinal, hilar, or axillary lymph nodes. Thyroid gland, trachea, and esophagus demonstrate no significant findings. Lungs/Pleura: Mild centrilobular emphysema. New small left pleural effusion associated atelectasis or consolidation. Musculoskeletal: No chest wall abnormality. No acute or significant osseous findings. Review of the MIP images confirms the above findings. CTA ABDOMEN AND PELVIS FINDINGS VASCULAR There is a redemonstrated aneurysmal abdominal aorta with severe atherosclerosis and a large burden of mural thrombus. There is a small, rim calcified saccular aneurysm or chronic penetrating ulceration of the anterior aorta just below the level of the diaphragmatic hiatus and above the origin of the celiac axis measuring 1.1 cm, unchanged compared to prior examination (series 7, image 143). There is a juxtarenal aneurysm component with a large burden of mural thrombus measuring up up to 5.1 x 4.9 cm enlarged compared to prior examination dated 08/18/2017, at which time it measured 4.5 x 4.3 cm when measured similarly (series 7, image 172). There is an additional infrarenal aneurysm component with a large burden of mural thrombus  measuring up to 4.8 x 4.2 cm enlarged compared to prior examination at which time it measured up to 3.7 x 3.6 cm (series 7, image 207). There is standard branching pattern of the abdominal aorta. There is apparent high-grade, near occlusive stenosis of the origin of the solitary right renal artery anteriorly from the aorta (series 7, image 159). There is otherwise calcific atherosclerosis of the branch vessel origins without evident high-grade stenosis. Review of the MIP images confirms the above findings. NON-VASCULAR Hepatobiliary: No solid liver abnormality is seen. No gallstones, gallbladder wall thickening, or biliary dilatation. Pancreas: Unremarkable. No pancreatic ductal dilatation or surrounding inflammatory changes. Spleen: Normal in size without significant abnormality. Adrenals/Urinary Tract: Adrenal glands are unremarkable. Somewhat atrophic appearing right kidney. Partially imaged lesion of the superior pole of the left kidney described on prior CT is a nonenhancing intermediate attenuation proteinaceous or hemorrhagic cyst on current examination. No renal calculi. No hydronephrosis. Bladder is unremarkable. Stomach/Bowel: Stomach is within normal limits. No evidence of bowel wall thickening, distention, or inflammatory changes. Pancolonic diverticulosis Lymphatic: No enlarged abdominal or pelvic lymph nodes. Reproductive: No mass or other significant abnormality. Other: No abdominal wall hernia or abnormality. No abdominopelvic ascites. Musculoskeletal: No acute or significant osseous findings. Review of the MIP images confirms the above findings. IMPRESSION: 1. Atherosclerosis of the thoracic aorta without evidence of aneurysm. 2. Redemonstrated aneurysmal abdominal aorta, with severe atherosclerosis and a large burden of mural thrombus. Juxtarenal and infrarenal aneurysm components detailed above  are enlarged compared to prior examination dated 08/18/2017, measuring up to 5.1 x 4.9 cm and 4.8 x 4.2  cm respectively. Aortic Atherosclerosis (ICD10-I70.0). 3. Small pericardial effusion, decreased compared to prior examination of the chest dated 10/22/2019. 4. New small left pleural effusion and associated atelectasis or consolidation. 5.  Coronary artery disease. 6. Partially imaged lesion of the superior pole of the left kidney described on prior CT is a nonenhancing, benign intermediate attenuation proteinaceous or hemorrhagic cyst on current examination. 7.  Emphysema (ICD10-J43.9). Electronically Signed   By: Eddie Candle M.D.   On: 12/03/2019 18:34   ECHOCARDIOGRAM LIMITED  Result Date: 11/19/2019    ECHOCARDIOGRAM LIMITED REPORT   Patient Name:   Chris Peterson Date of Exam: 11/19/2019 Medical Rec #:  [id number]        Height:       69.0 in Accession #:    [id number]       Weight:       138.0 lb Date of Birth:  Oct 17, 1929        BSA:          1.765 m Patient Age:    55 years         BP:           136/64 mmHg Patient Gender: M                HR:           86 bpm. Exam Location:  [facility] Procedure: 2D Echo, Cardiac Doppler, Limited Echo and Limited Color Doppler Indications:    I31.3  History:        Patient has prior history of Echocardiogram examinations, most                 recent 10/27/2019. Pericardial effusion (s/p pericardiocentesis                 10/27/19); Risk Factors:Hypertension, Diabetes, Dyslipidemia and                 Current Smoker.  Sonographer:    Coralyn Helling RDCS Referring Phys: [place]  1. Large pericardial effusion. The pericardial effusion is circumferential. There is no evidence of cardiac tamponade.  2. Left ventricular ejection fraction, by estimation, is 60 to 65%. The left ventricle has normal function. There is moderate left ventricular hypertrophy. Elevated left ventricular end-diastolic pressure.  3. Right ventricular systolic function is mildly reduced. There is normal pulmonary artery systolic pressure.  4. Left atrial size was severely  dilated by visual estimate.  5. Right atrial size was moderately dilated by visual estimate.  6. The mitral valve is abnormal. Mild mitral valve regurgitation.  7. There is mild dilatation of the aortic root measuring 38 mm.  8. The inferior vena cava is normal in size with greater than 50% respiratory variability, suggesting right atrial pressure of 3 mmHg. Comparison(s): A prior study was performed on 10/27/19. Prior images reviewed side by side. Compared to post pericardiocentesis echo, pericardial effusion has increased. No findings to suggest tamponade physiology. IVC is normal size and collapses, no RV diastolic compression. FINDINGS  Left Ventricle: Left ventricular ejection fraction, by estimation, is 60 to 65%. The left ventricle has normal function. There is moderate left ventricular hypertrophy. Elevated left ventricular end-diastolic pressure. Right Ventricle: Right ventricular systolic function is mildly reduced. There is normal pulmonary artery systolic pressure. The tricuspid regurgitant velocity is 2.55 m/s, and with an assumed right atrial pressure  of 3 mmHg, the estimated right ventricular systolic pressure is 63.8 mmHg. Left Atrium: Left atrial size was severely dilated. Right Atrium: Right atrial size was moderately dilated. Pericardium: A large pericardial effusion is present. The pericardial effusion is circumferential. There is no evidence of cardiac tamponade. Presence of pericardial fat pad. Mitral Valve: The mitral valve is abnormal. Mild to moderate mitral annular calcification. Mild mitral valve regurgitation. Tricuspid Valve: Tricuspid valve regurgitation is mild. Aorta: There is mild dilatation of the aortic root measuring 38 mm. Venous: The inferior vena cava is normal in size with greater than 50% respiratory variability, suggesting right atrial pressure of 3 mmHg.  LEFT VENTRICLE PLAX 2D LVIDd:         3.60 cm  Diastology LVIDs:         2.30 cm  LV e' lateral:   5.08 cm/s LV PW:          1.60 cm  LV E/e' lateral: 27.8 LV IVS:        1.10 cm  LV e' medial:    5.80 cm/s LVOT diam:     2.00 cm  LV E/e' medial:  24.3 LV SV:         65 LV SV Index:   37 LVOT Area:     3.14 cm  RIGHT VENTRICLE            IVC RVSP:           29.0 mmHg  IVC diam: 1.40 cm LEFT ATRIUM         Index      RIGHT ATRIUM LA diam:    4.40 cm 2.49 cm/m RA Pressure: 3.00 mmHg  AORTIC VALVE LVOT Vmax:   110.16 cm/s LVOT Vmean:  69.480 cm/s LVOT VTI:    0.206 m  AORTA Ao Root diam: 3.80 cm MV E velocity: 141.20 cm/s  TRICUSPID VALVE                             TR Peak grad:   26.0 mmHg                             TR Vmax:        255.00 cm/s                             Estimated RAP:  3.00 mmHg                             RVSP:           29.0 mmHg                              SHUNTS                             Systemic VTI:  0.21 m                             Systemic Diam: 2.00 cm Cherlynn Kaiser MD Electronically signed by Cherlynn Kaiser MD Signature Date/Time: 11/19/2019/1:10:14 PM    Final    CT ANGIO ABDOMEN PELVIS  W &/OR WO CONTRAST  Result Date: 12/03/2019  CLINICAL DATA:  Aortic aneurysm surveillance EXAM: CT ANGIOGRAPHY CHEST, ABDOMEN AND PELVIS TECHNIQUE: Non-contrast CT of the chest was initially obtained. Multidetector CT imaging through the chest, abdomen and pelvis was performed using the standard protocol during bolus administration of intravenous contrast. Multiplanar reconstructed images and MIPs were obtained and reviewed to evaluate the vascular anatomy. CONTRAST:  40m OMNIPAQUE IOHEXOL 350 MG/ML SOLN COMPARISON:  CT chest, 10/22/2019, CT abdomen pelvis, 08/18/2017, MR abdomen, 11/12/2011 FINDINGS: CTA CHEST FINDINGS Cardiovascular: Preferential opacification of the thoracic aorta. Aortic valve calcifications. Aortic atherosclerosis with irregular mural thrombus. Normal contour and caliber of the thoracic aorta. Normal heart size. Extensive 3 vessel coronary artery calcifications and/or stents. Small  pericardial effusion, decreased compared to prior examination dated 10/22/2019. Mediastinum/Nodes: No enlarged mediastinal, hilar, or axillary lymph nodes. Thyroid gland, trachea, and esophagus demonstrate no significant findings. Lungs/Pleura: Mild centrilobular emphysema. New small left pleural effusion associated atelectasis or consolidation. Musculoskeletal: No chest wall abnormality. No acute or significant osseous findings. Review of the MIP images confirms the above findings. CTA ABDOMEN AND PELVIS FINDINGS VASCULAR There is a redemonstrated aneurysmal abdominal aorta with severe atherosclerosis and a large burden of mural thrombus. There is a small, rim calcified saccular aneurysm or chronic penetrating ulceration of the anterior aorta just below the level of the diaphragmatic hiatus and above the origin of the celiac axis measuring 1.1 cm, unchanged compared to prior examination (series 7, image 143). There is a juxtarenal aneurysm component with a large burden of mural thrombus measuring up up to 5.1 x 4.9 cm enlarged compared to prior examination dated 08/18/2017, at which time it measured 4.5 x 4.3 cm when measured similarly (series 7, image 172). There is an additional infrarenal aneurysm component with a large burden of mural thrombus measuring up to 4.8 x 4.2 cm enlarged compared to prior examination at which time it measured up to 3.7 x 3.6 cm (series 7, image 207). There is standard branching pattern of the abdominal aorta. There is apparent high-grade, near occlusive stenosis of the origin of the solitary right renal artery anteriorly from the aorta (series 7, image 159). There is otherwise calcific atherosclerosis of the branch vessel origins without evident high-grade stenosis. Review of the MIP images confirms the above findings. NON-VASCULAR Hepatobiliary: No solid liver abnormality is seen. No gallstones, gallbladder wall thickening, or biliary dilatation. Pancreas: Unremarkable. No pancreatic  ductal dilatation or surrounding inflammatory changes. Spleen: Normal in size without significant abnormality. Adrenals/Urinary Tract: Adrenal glands are unremarkable. Somewhat atrophic appearing right kidney. Partially imaged lesion of the superior pole of the left kidney described on prior CT is a nonenhancing intermediate attenuation proteinaceous or hemorrhagic cyst on current examination. No renal calculi. No hydronephrosis. Bladder is unremarkable. Stomach/Bowel: Stomach is within normal limits. No evidence of bowel wall thickening, distention, or inflammatory changes. Pancolonic diverticulosis Lymphatic: No enlarged abdominal or pelvic lymph nodes. Reproductive: No mass or other significant abnormality. Other: No abdominal wall hernia or abnormality. No abdominopelvic ascites. Musculoskeletal: No acute or significant osseous findings. Review of the MIP images confirms the above findings. IMPRESSION: 1. Atherosclerosis of the thoracic aorta without evidence of aneurysm. 2. Redemonstrated aneurysmal abdominal aorta, with severe atherosclerosis and a large burden of mural thrombus. Juxtarenal and infrarenal aneurysm components detailed above are enlarged compared to prior examination dated 08/18/2017, measuring up to 5.1 x 4.9 cm and 4.8 x 4.2 cm respectively. Aortic Atherosclerosis (ICD10-I70.0). 3. Small pericardial effusion, decreased compared to prior examination of the chest dated 10/22/2019. 4. New small left  pleural effusion and associated atelectasis or consolidation. 5.  Coronary artery disease. 6. Partially imaged lesion of the superior pole of the left kidney described on prior CT is a nonenhancing, benign intermediate attenuation proteinaceous or hemorrhagic cyst on current examination. 7.  Emphysema (ICD10-J43.9). Electronically Signed   By: Eddie Candle M.D.   On: 12/03/2019 18:34      Discharge Medications: Allergies as of 12/13/2019   No Known Allergies      Medication List     STOP  taking these medications    ciprofloxacin 250 MG tablet Commonly known as: Cipro       TAKE these medications    carvedilol 6.25 MG tablet Commonly known as: COREG Take 3 tablets (18.75 mg total) by mouth 2 (two) times daily with a meal. What changed:  medication strength how much to take   cholecalciferol 25 MCG (1000 UNIT) tablet Commonly known as: VITAMIN D Take 1 tablet (1,000 Units total) by mouth daily. What changed: how much to take   furosemide 20 MG tablet Commonly known as: LASIX Take 40 mg by mouth daily. What changed: Another medication with the same name was removed. Continue taking this medication, and follow the directions you see here.   lisinopril 20 MG tablet Commonly known as: ZESTRIL Take 0.5 tablets (10 mg total) by mouth daily. What changed: medication strength   loperamide 2 MG capsule Commonly known as: IMODIUM Take 1 capsule (2 mg total) by mouth as needed for diarrhea or loose stools. What changed: when to take this   rosuvastatin 40 MG tablet Commonly known as: CRESTOR Take 40 mg by mouth daily.   tamsulosin 0.4 MG Caps capsule Commonly known as: FLOMAX Take 0.4 mg by mouth daily.   traMADol 50 MG tablet Commonly known as: ULTRAM Take 1 tablet (50 mg total) by mouth every 6 (six) hours as needed for severe pain.        Follow Up Appointments: Follow-up Information     Prescott Gum, Collier Salina, MD. Go on 12/22/2019.   Specialty: Cardiothoracic Surgery Why: PA/LAT CXR to be taken (at [facility] which is in the same building as Dr. Lucianne Lei Trigt's office) on 05/05 at 9:30 am;Appointment time is at 10:00 am Contact information: [place] [address] [phone]         Lawerance Cruel, MD. Call.   Specialty: Family Medicine Why: for a follow up appointment for 1-2 weeks regarding further management of blood pressure and titration of medications. Contact information: [place]  [address] [place] [address] [phone]         Sherren Mocha, MD .   Specialty: Cardiology Contact information: [phone] [address] [place] [address] [phone]            Signed: Sharalyn Ink [facility] 12/13/2019, 9:24 AM   patient examined and medical record reviewed,agree with above note. Tharon Aquas Trigt III 12/20/2019

## 2019-12-09 NOTE — Brief Op Note (Signed)
12/09/2019  8:47 AM  PATIENT:  [name]  86 y.o. male  PRE-OPERATIVE DIAGNOSIS:  RECURRENT PERICARDIAL EFFUSION  POST-OPERATIVE DIAGNOSIS:  RECURRENT PERICARDIAL EFFUSION  PROCEDURES:    -SUBXYPHOID PERICARDIAL WINDOW   -TRANSESOPHAGEAL ECHOCARDIOGRAM (TEE) (N/A)  SURGEON: Ivin Poot, MD   PHYSICIAN ASSISTANT: Geneviene Tesch  ANESTHESIA:   general  EBL:  Minimal   BLOOD ADMINISTERED:none  DRAINS: Mediastinal Blake drain.    LOCAL MEDICATIONS USED:  NONE  SPECIMEN:  Source of Specimen:  Pericardial fluid, pericardium.   DISPOSITION OF SPECIMEN:  PATHOLOGY  COUNTS: Correct  DICTATION: .Dragon Dictation  PLAN OF CARE: Admit to inpatient   PATIENT DISPOSITION:  PACU - hemodynamically stable.   Delay start of Pharmacological VTE agent (>24hrs) due to surgical blood loss or risk of bleeding: yes

## 2019-12-09 NOTE — H&P (Signed)
PCP is Lawerance Cruel, MD Referring Provider is No ref. provider found  No chief complaint on file. Patient examined, images of echocardiograms and chest CT scan personally reviewed.  HPI: 86 year old patient presents for evaluation of recurrent pericardial effusion.  In early March he was evaluated for shortness of breath and found to have large pericardial effusion.  Patient's cardiac history is positive for atrial fibrillation on no anticoagulation due to history of GI bleeding as well as a 5 cm abdominal aneurysm.  He had no significant valvular disease or ventricular dysfunction.  He had an outpatient pericardiocentesis by Dr. Burt Knack at 750 cc of serous fluid.  Right heart cath pressures and cardiac output were normal.  His symptoms improved.  10 days ago the patient had a follow-up cardiac echo which showed some recurrence of the effusion but less than 2 cm diameter.  The past few days the patient has had noted some increasing shortness of breath with his usual activities.  No orthopnea PND ankle edema or abdominal edema.  The patient has been recommended by his cardiologist for subxiphoid pericardial window for recurrent pericardial effusion.  Pericardial fluid showed no evidence of infection or malignancy.  Inflammatory mesenchymal cells were noted.  Patient generally is in good health.  He lives with his wife and functions normally.  He is not been hospitalized for several years since he had the upper GI bleeding.  His chest CT scan in early March shows some calcification of the thoracic aorta but no aneurysmal dilatation.  No pleural effusion was noted.  Lung windows are clear.   Past Medical History:  Diagnosis Date  . AAA (abdominal aortic aneurysm) ([place])   . Atherosclerotic renal artery stenosis, bilateral (H[facility])    Bilateral renal angiography 11/27/11, Right 70% prox, Left 90% prox s/p LRA PTA/Stenting - 6.0x33m ;  12/12/2011 Renal Duplex: Patent LRA stent & > 60% RRA ost stenosis   . [place]rial fibrillation ([place] 11/19/2019  . Blood transfusion    S/P "lost blood due to hemorrhoids"  . Complication of anesthesia 2013   Severe Diarrhea aftter renal artery  . Dyspnea    with effusion  . Dysrhythmia    "fast one time"  . Hemorrhoids   . High cholesterol   . HTN (hypertension)   . Kidney disease, chronic, stage III (moderate, EGFR 30-59 ml/min)    [facility]  . Pericardial effusion    a. incidentally noted on renal u/s 11/2011;  b.  12/16/2011 Echo:  EF 55-60%, Gr1 DD, Mild MR, PASP 32, Mod circumferential peric effusion w/o hemodynamic compromise. Density in pericardial space - consider MRI  . Pre-diabetes   . Sciatica     Past Surgical History:  Procedure Laterality Date  . ABDOMINAL ANGIOGRAM N/A 11/27/2011   Procedure: ABDOMINAL ANGIOGRAM;  Surgeon: MSherren Mocha MD;  [facility];  Service: Cardiovascular;  Laterality: N/A;  . BIOPSY  03/13/2018   Procedure: BIOPSY;  Surgeon: SLadene Artist MD;  Location: [facility] ENDOSCOPY;  Service: Endoscopy;;  . CATARACT EXTRACTION W/ INTRAOCULAR LENS  IMPLANT, BILATERAL  1990's  . ESOPHAGOGASTRODUODENOSCOPY N/A 03/13/2018   Procedure: ESOPHAGOGASTRODUODENOSCOPY (EGD);  Surgeon: SLadene Artist MD;  [facility];  Service: Endoscopy;  Laterality: N/A;  . HOT HEMOSTASIS N/A 03/13/2018   Procedure: HOT HEMOSTASIS (ARGON PLASMA COAGULATION/BICAP);  Surgeon: SLadene Artist MD;  [facility];  Service: Endoscopy;  Laterality: N/A;  . NASAL SEPTUM SURGERY  1980's  . PERICARDIOCENTESIS N/A 10/27/2019   Procedure:  PERICARDIOCENTESIS;  Surgeon: Sherren Mocha, MD;  Location: [facility] CV LAB;  Service: Cardiovascular;  Laterality: N/A;  . RENAL ANGIOGRAM Bilateral 11/27/2011   Procedure: RENAL ANGIOGRAM;  Surgeon: Sherren Mocha, MD;  Location[facility]ATH LAB;  Service: Cardiovascular;  Laterality: Bilateral;  possible PTA/stent  . RENAL ARTERY STENT  11/27/11  . RIGHT HEART CATH N/A 10/27/2019    Procedure: RIGHT HEART CATH;  Surgeon: Sherren Mocha, MD;  Location: [facility] CV LAB;  Service: Cardiovascular;  Laterality: N/A;    Family History  Problem Relation Age of Onset  . Hypertension Mother   . Hypertension Brother     Social History Social History   Tobacco Use  . Smoking status: Current Every Day Smoker    Years: 65.00    Types: Pipe, Cigarettes    Last attempt to quit: 08/19/1996    Years since quitting: 23.3  . Smokeless tobacco: Never Used  . Tobacco comment: 12/07/2019=- smokes a pipe  Substance Use Topics  . Alcohol use: Yes    Alcohol/week: 10.0 standard drinks    Types: 10 Shots of liquor per week    Comment: 12/07/2019 - drinks 1 - 2 oz a day  . Drug use: No    Current Facility-Administered Medications  Medication Dose Route Frequency Provider Last Rate Last Admin  . 0.9 % irrigation (POUR BTL)    PRN Prescott Gum, Collier Salina, MD   2,000 mL at 12/09/19 0711  . ceFAZolin (ANCEF) 2-4 GM/100ML-% IVPB           . ceFAZolin (ANCEF) IVPB 2g/100 mL premix  2 g Intravenous 30 min Pre-Op Ivin Poot, MD       Facility-Administered Medications Ordered in Other Encounters  Medication Dose Route Frequency Provider Last Rate Last Admin  . fentaNYL citrate (PF) (SUBLIMAZE) injection   Intravenous Anesthesia Intra-op Hessie Dibble T, CRNA   50 mcg at 12/09/19 0715  . lactated ringers infusion   Intravenous Continuous PRN Janace Litten, CRNA   New Bag at 12/09/19 [phone]  . midazolam (VERSED) injection   Intravenous Anesthesia Intra-op Hessie Dibble T, CRNA   1 mg at 12/09/19 0263    No Known Allergies  Review of Systems                    Review of Systems :  [ y ] = yes, [  ] = no        General :  Weight gain [   ]    Weight loss  [   ]  Fatigue [ y ]  Fever [  ]  Chills  [  ]                                          HEENT    Headache [  ]  Dizziness [  ]  Blurred vision [  ] Glaucoma  [  ]                          Nosebleeds [  ] Painful or loose teeth  [  ]        Cardiac :  Chest pain/ pressure [  ]  Resting SOB [  ] exertional SOB [ y ]  Orthopnea [  ]  Pedal edema  [  ]  Palpitations [  ] Syncope/presyncope _0                         Paroxysmal nocturnal dyspnea [  ]         Pulmonary : cough [  ]  wheezing [  ]  Hemoptysis [  ] Sputum [  ] Snoring [  ]                              Pneumothorax [  ]  Sleep apnea [  ]        GI : Vomiting [  ]  Dysphagia [  ]  Melena  [  ]  Abdominal pain [  ] BRBPR [  ]              Heart burn [  ]  Constipation [  ] Diarrhea  [  ] Colonoscopy [   ] history of upper GI bleeding from peptic disease over 3 years ago now resolved        GU : Hematuria [  ]  Dysuria [  ]  Nocturia [  ] UTI's Blue.Reese  ] patient recently finished a course of oral antibiotics for UTI.  By his primary care.  He takes Flomax for BPH.        Vascular : Claudication [  ]  Rest pain [  ]  DVT [  ] Vein stripping [  ] leg ulcers [  ] recent diagnosis of a abdominal aneurysm being evaluated by Dr. Trula Slade                          TIA [  ] Stroke [  ]  Varicose veins [  ]        NEURO :  Headaches  [  ] Seizures [  ] Vision changes [  ] Paresthesias [  ]    right-hand-dominant                                           Musculoskeletal :  Arthritis [  ] Gout  [  ]  Back pain [  ]  Joint pain [  ]        Skin :  Rash [  ]  Melanoma [  ] Sores [  ]        Heme : Bleeding problems [  ]Clotting Disorders [  ] Anemia [  ]Blood Transfusion _1         Endocrine : Diabetes [  ] Heat or Cold intolerance [  ] Polyuria [  ]excessive thirst _2         Psych : Depression [  ]  Anxiety [  ]  Psych hospitalizations [  ] Memory change [  ]  BP (!) 170/95   Pulse 88   Temp 98.1 F (36.7 C)   Resp 18   Ht _0  (1.753 m)   Wt 61.2 kg   SpO2 98%   BMI 19.94 kg/m  Physical Exam     Physical Exam  General: Elderly but very sharp 86 year old  male no acute distress accompanied by his daughter HEENT: Normocephalic pupils equal , dentition adequate Neck: Supple without JVD, adenopathy, or bruit Chest: Clear to auscultation, symmetrical breath sounds, no rhonchi, no tenderness             or deformity Cardiovascular: Irregular heart rate-atrial fibrillation, no murmur, no gallop, peripheral pulses             palpable in all extremities Abdomen:  Soft, nontender, no palpable mass or organomegaly Extremities: Warm, well-perfused, no clubbing cyanosis edema or tenderness,              no venous stasis changes of the legs Rectal/GU: Deferred Neuro: Grossly non--focal and symmetrical throughout Skin: Clean and dry without rash or ulceration   Diagnostic Tests: Echocardiogram 10 to 12 days ago reviewed showing some recurrence of pericardial effusion good LV function  Impression: Recurrent pericardial effusion, benign by previous analysis.  Probably related to hypertension and diastolic heart failure. Patient will need subxiphoid pericardial window for definitive therapy and to prevent recurrence.  I discussed the procedure benefits and risks in detail with the patient and his daughter.  Early next week the patient is scheduled for an abdominal CT scan and evaluation by Dr. Trula Slade for a AAA.  We will schedule the elective pericardial window for April 22 and anticipate approximately 3 nights in the hospital.  The patient and daughter understand the benefits and risks of surgery.  Plan: Admit to [facility] hospital April 22 via the OR for subxiphoid pericardial window   Len Childs, MD [facility] [phone]

## 2019-12-09 NOTE — Anesthesia Procedure Notes (Signed)
Arterial Line Insertion Start/End4/22/2021 7:00 AM Performed by: Janace Litten, CRNA, CRNA  Preanesthetic checklist: patient identified, IV checked, risks and benefits discussed, surgical consent, monitors and equipment checked and pre-op evaluation Lidocaine 1% used for infiltration Left, radial was placed Catheter size: 20 G Hand hygiene performed  and maximum sterile barriers used  Allen's test indicative of satisfactory collateral circulation Attempts: 1 Procedure performed without using ultrasound guided technique. Following insertion, dressing applied and Biopatch. Post procedure assessment: normal  Patient tolerated the procedure well with no immediate complications.

## 2019-12-10 ENCOUNTER — Inpatient Hospital Stay (HOSPITAL_COMMUNITY): Payer: MEDICARE

## 2019-12-10 LAB — BASIC METABOLIC PANEL
Anion gap: 12 (ref 5–15)
BUN: 23 mg/dL (ref 8–23)
CO2: 24 mmol/L (ref 22–32)
Calcium: 8.5 mg/dL — ABNORMAL LOW (ref 8.9–10.3)
Chloride: 99 mmol/L (ref 98–111)
Creatinine, Ser: 1.19 mg/dL (ref 0.61–1.24)
GFR calc Af Amer: >60 mL/min (ref >60)
GFR calc non Af Amer: 55 mL/min — ABNORMAL LOW (ref >60)
Glucose, Bld: 189 mg/dL — ABNORMAL HIGH (ref 70–99)
Potassium: 3.3 mmol/L — ABNORMAL LOW (ref 3.5–5.1)
Sodium: 135 mmol/L (ref 135–145)

## 2019-12-10 LAB — CBC
HCT: 35.7 % — ABNORMAL LOW (ref 39.0–52.0)
Hemoglobin: 10.9 g/dL — ABNORMAL LOW (ref 13.0–17.0)
MCH: 25 pg — ABNORMAL LOW (ref 26.0–34.0)
MCHC: 30.5 g/dL (ref 30.0–36.0)
MCV: 81.9 fL (ref 80.0–100.0)
Platelets: 132 10*3/uL — ABNORMAL LOW (ref 150–400)
RBC: 4.36 MIL/uL (ref 4.22–5.81)
RDW: 17.4 % — ABNORMAL HIGH (ref 11.5–15.5)
WBC: 9.9 10*3/uL (ref 4.0–10.5)
nRBC: 0 % (ref 0.0–0.2)

## 2019-12-10 LAB — POCT I-STAT 7, (LYTES, BLD GAS, ICA,H+H)
Acid-Base Excess: 1 mmol/L (ref 0.0–2.0)
Bicarbonate: 25.8 mmol/L (ref 20.0–28.0)
Calcium, Ion: 1.2 mmol/L (ref 1.15–1.40)
HCT: 35 % — ABNORMAL LOW (ref 39.0–52.0)
Hemoglobin: 11.9 g/dL — ABNORMAL LOW (ref 13.0–17.0)
O2 Saturation: 97 %
Patient temperature: 97.8
Potassium: 3.4 mmol/L — ABNORMAL LOW (ref 3.5–5.1)
Sodium: 136 mmol/L (ref 135–145)
TCO2: 27 mmol/L (ref 22–32)
pCO2 arterial: 39.4 mmHg (ref 32.0–48.0)
pH, Arterial: 7.422 (ref 7.350–7.450)
pO2, Arterial: 90 mmHg (ref 83.0–108.0)

## 2019-12-10 LAB — SURGICAL PATHOLOGY

## 2019-12-10 LAB — CYTOLOGY - NON PAP

## 2019-12-10 MED ORDER — CHLORHEXIDINE GLUCONATE CLOTH 2 % EX PADS
6.0000 | MEDICATED_PAD | Freq: Every day | CUTANEOUS | Status: DC
  Administered 2019-12-10: 6 via TOPICAL

## 2019-12-10 MED ORDER — SODIUM CHLORIDE 0.9% FLUSH
10.0000 mL | Freq: Two times a day (BID) | INTRAVENOUS | Status: DC
  Administered 2019-12-10 – 2019-12-12 (×3): 10 mL

## 2019-12-10 MED ORDER — SODIUM CHLORIDE 0.9% FLUSH: 10.0000 mL | INTRAVENOUS | Status: DC | PRN

## 2019-12-10 MED ORDER — CARVEDILOL 6.25 MG PO TABS
18.7500 mg | ORAL_TABLET | Freq: Two times a day (BID) | ORAL | Status: DC
  Administered 2019-12-10 – 2019-12-13 (×6): 18.75 mg via ORAL
  Filled 2019-12-10 (×6): qty 1

## 2019-12-10 MED ORDER — POTASSIUM CHLORIDE CRYS ER 20 MEQ PO TBCR
20.0000 meq | EXTENDED_RELEASE_TABLET | ORAL | Status: AC
  Administered 2019-12-10 (×3): 20 meq via ORAL
  Filled 2019-12-10 (×3): qty 1

## 2019-12-10 MED ORDER — TAMSULOSIN HCL 0.4 MG PO CAPS
0.4000 mg | ORAL_CAPSULE | Freq: Every day | ORAL | Status: DC
  Administered 2019-12-10 – 2019-12-13 (×4): 0.4 mg via ORAL
  Filled 2019-12-10 (×4): qty 1

## 2019-12-10 NOTE — Plan of Care (Signed)

## 2019-12-10 NOTE — Progress Notes (Addendum)
[address]       [place] [address]             [phone]        1 Day Post-Op Procedure(s) (LRB): SUBXYPHOID PERICARDIAL WINDOW (N/A) TRANSESOPHAGEAL ECHOCARDIOGRAM (TEE) (N/A)  Subjective: Patient sitting in chair without specific complaint this am. Daughter at bedside  Objective: Vital signs in last 24 hours: Temp:  [97.5 F (36.4 C)-98.6 F (37 C)] 97.9 F (36.6 C) (04/23 0400) Pulse Rate:  [43-96] 78 (04/23 0747) Cardiac Rhythm: Atrial fibrillation (04/23 0400) Resp:  [9-28] 17 (04/23 0700) BP: (142-178)/(70-115) 161/70 (04/23 0747) SpO2:  [90 %-100 %] 97 % (04/23 0700) Arterial Line BP: (143-206)/(62-100) 156/69 (04/23 0700)   Current Weight  12/09/19 61.2 kg       Intake/Output from previous day: 04/22 0701 - 04/23 0700 In: 2296.1 [P.O.:240; I.V.:1456.2; IV Piggyback:599.9] Out: [id number] [Urine:1320; Chest Tube:230]   Physical Exam:  Cardiovascular: IRRR IRRR Pulmonary: Clear to auscultation bilaterally Abdomen: Soft, non tender, bowel sounds present. Extremities: No lower extremity edema. Wound: Clean and dry.  No erythema or signs of infection.  Lab Results: CBC: Recent Labs    12/07/19 1144 12/07/19 1144 12/10/19 0353 12/10/19 0354  WBC 7.1  --  9.9  --   HGB 12.3*   < > 10.9* 11.9*  HCT 39.6   < > 35.7* 35.0*  PLT 174  --  132*  --    < > = values in this interval not displayed.   BMET:  Recent Labs    12/07/19 1144 12/07/19 1144 12/10/19 0353 12/10/19 0354  NA 142   < > 135 136  K 4.1   < > 3.3* 3.4*  CL 107  --  99  --   CO2 22  --  24  --   GLUCOSE 120*  --  189*  --   BUN 28*  --  23  --   CREATININE 1.35*  --  1.19  --   CALCIUM 9.0  --  8.5*  --    < > = values in this interval not displayed.    PT/INR:  Lab Results  Component Value Date   INR 1.2 12/07/2019   INR 1.32 03/13/2018   INR 1.42 11/27/2011   ABG:  INR: Will add last result for INR, ABG once components are confirmed Will add  last 4 CBG results once components are confirmed  Assessment/Plan:  1. CV - A fib (chronic) with CVR this am. On Carvedilol 12.5 mg bid, Lisinopril 10 mg daily.  2.  Pulmonary - On 3 liters of oxygen via [place]. Wean as able. Pericardial tube with 230 cc last 24 hours. CXR this am appears stable (left base atelectasis, no pneumothorax). Pericardial drain to remain for now. Pericardial fluid gram stain showed no organisms seen, culture pending). 3. Supplement potassium 4.  Anemia-H and H this am 10.9 and 35.7 5. Mild thrombocytopenia-platelets this am 132,000 6. Will remove a line and foley;restart Flomax;heplock IVF 7. Likely transfer to 4E, if surgeon in agreement  [place] 12/10/2019,8:14 AM  Recurrent pericardial effusion related to chronic afib diastolic HF Patient with stable blood pressure and chronic atrial fibrillation Chest x-ray satisfactory We will plan  transfer to progressive care unit when pericardial drain is out Remove pericardial drain when output less than 50 cc. Place pericardial drain to waterseal.  patient examined and medical record reviewed,agree with above note. Tharon Aquas Trigt III  12/10/2019 Addendum - ICU bed needed for emergencies- patient stable for 2 C  P Prescott Gum

## 2019-12-11 ENCOUNTER — Inpatient Hospital Stay (HOSPITAL_COMMUNITY): Payer: MEDICARE

## 2019-12-11 LAB — COMPREHENSIVE METABOLIC PANEL
ALT: 6 U/L (ref 0–44)
AST: 22 U/L (ref 15–41)
Albumin: 2.5 g/dL — ABNORMAL LOW (ref 3.5–5.0)
Alkaline Phosphatase: 45 U/L (ref 38–126)
Anion gap: 9 (ref 5–15)
BUN: 19 mg/dL (ref 8–23)
CO2: 28 mmol/L (ref 22–32)
Calcium: 8.7 mg/dL — ABNORMAL LOW (ref 8.9–10.3)
Chloride: 103 mmol/L (ref 98–111)
Creatinine, Ser: 1.31 mg/dL — ABNORMAL HIGH (ref 0.61–1.24)
GFR calc Af Amer: 56 mL/min — ABNORMAL LOW (ref >60)
GFR calc non Af Amer: 49 mL/min — ABNORMAL LOW (ref >60)
Glucose, Bld: 92 mg/dL (ref 70–99)
Potassium: 4.2 mmol/L (ref 3.5–5.1)
Sodium: 140 mmol/L (ref 135–145)
Total Bilirubin: 0.5 mg/dL (ref 0.3–1.2)
Total Protein: 5.8 g/dL — ABNORMAL LOW (ref 6.5–8.1)

## 2019-12-11 LAB — CBC
HCT: 35.7 % — ABNORMAL LOW (ref 39.0–52.0)
Hemoglobin: 11 g/dL — ABNORMAL LOW (ref 13.0–17.0)
MCH: 25.3 pg — ABNORMAL LOW (ref 26.0–34.0)
MCHC: 30.8 g/dL (ref 30.0–36.0)
MCV: 82.3 fL (ref 80.0–100.0)
Platelets: 143 10*3/uL — ABNORMAL LOW (ref 150–400)
RBC: 4.34 MIL/uL (ref 4.22–5.81)
RDW: 17.9 % — ABNORMAL HIGH (ref 11.5–15.5)
WBC: 9.8 10*3/uL (ref 4.0–10.5)
nRBC: 0 % (ref 0.0–0.2)

## 2019-12-11 MED ORDER — LISINOPRIL 20 MG PO TABS
20.0000 mg | ORAL_TABLET | Freq: Every day | ORAL | Status: DC
  Administered 2019-12-12 – 2019-12-13 (×2): 20 mg via ORAL
  Filled 2019-12-11 (×2): qty 1

## 2019-12-11 NOTE — Plan of Care (Signed)

## 2019-12-11 NOTE — Progress Notes (Addendum)
[address]       [place] [address]             [phone]        2 Days Post-Op Procedure(s) (LRB): SUBXYPHOID PERICARDIAL WINDOW (N/A) TRANSESOPHAGEAL ECHOCARDIOGRAM (TEE) (N/A)  Subjective: Patient sitting in chair without specific complaint this am. Daughter at bedside  Objective: Vital signs in last 24 hours: Temp:  [97.5 F (36.4 C)-98.6 F (37 C)] 97.7 F (36.5 C) (04/24 0318) Pulse Rate:  [65-113] 100 (04/24 0319) Cardiac Rhythm: Atrial fibrillation (04/24 0800) Resp:  [11-33] 19 (04/24 0319) BP: (111-172)/(65-103) 160/103 (04/24 0700) SpO2:  [90 %-97 %] 91 % (04/24 0319) FiO2 (%):  [21 %] 21 % (04/23 1500)   Current Weight  12/09/19 61.2 kg       Intake/Output from previous day: 04/23 0701 - 04/24 0700 In: 190.3 [I.V.:90.3; IV Piggyback:100.1] Out: 1220 [Urine:1150; Chest Tube:70]   Physical Exam:  Cardiovascular: IRRR IRRR Pulmonary: Clear to auscultation bilaterally Abdomen: Soft, non tender, bowel sounds present. Extremities: No lower extremity edema. Wound: Clean and dry.  No erythema or signs of infection.  Lab Results: CBC: Recent Labs    12/10/19 0353 12/10/19 0353 12/10/19 0354 12/11/19 0215  WBC 9.9  --   --  9.8  HGB 10.9*   < > 11.9* 11.0*  HCT 35.7*   < > 35.0* 35.7*  PLT 132*  --   --  143*   < > = values in this interval not displayed.   BMET:  Recent Labs    12/10/19 0353 12/10/19 0353 12/10/19 0354 12/11/19 0215  NA 135   < > 136 140  K 3.3*   < > 3.4* 4.2  CL 99  --   --  103  CO2 24  --   --  28  GLUCOSE 189*  --   --  92  BUN 23  --   --  19  CREATININE 1.19  --   --  1.31*  CALCIUM 8.5*  --   --  8.7*   < > = values in this interval not displayed.    PT/INR:  Lab Results  Component Value Date   INR 1.2 12/07/2019   INR 1.32 03/13/2018   INR 1.42 11/27/2011   ABG:  INR: Will add last result for INR, ABG once components are confirmed Will add last 4 CBG results once  components are confirmed  Assessment/Plan:  1. CV - A fib (chronic) with CVR this am. He is more hypertensive with SBP 160's+.On Carvedilol 12.5 mg bid, Lisinopril 10 mg daily as taken prior to admission. As discussed with Dr. Cyndia Bent, will increase Lisinopril to 20 mg daily and have patient follow up with medical doctor after discharge. 2.  Pulmonary - On 3 liters of oxygen via [place]. Wean as able. Pericardial tube with 70 cc last 24 hours. CXR this am appears stable (left base opacity, no pneumothorax). Pericardial drain is decreasing and hope to remove in am. Pericardial fluid gram stain showed no organisms seen, culture no growth 1 day). 4.  Anemia-H and H this am stable at 11 and 35.7 5. Mild thrombocytopenia-platelets this am up to 143,000 6. CKD (stage III)-Creatinine slightly increased to 1.31 this am. He is on Lisinopril. His creatinine upon admission was 1.35.  Re check in am  Donielle M ZimmermanPA-C 12/11/2019,10:19 AM   Chart reviewed, patient examined, agree with above. Pericardial tube can come out in the  am. Plan home Monday.

## 2019-12-12 ENCOUNTER — Inpatient Hospital Stay (HOSPITAL_COMMUNITY): Payer: MEDICARE

## 2019-12-12 MED ORDER — TRAMADOL HCL 50 MG PO TABS
50.0000 mg | ORAL_TABLET | Freq: Four times a day (QID) | ORAL | Status: DC | PRN
  Administered 2019-12-12 (×2): 50 mg via ORAL
  Filled 2019-12-12 (×3): qty 1

## 2019-12-12 MED ORDER — VITAMIN D3 25 MCG (1000 UNIT) PO TABS: 1000.0000 [IU] | ORAL_TABLET | Freq: Every day | ORAL | Status: AC

## 2019-12-12 MED ORDER — GUAIFENESIN-DM 100-10 MG/5ML PO SYRP
5.0000 mL | ORAL_SOLUTION | ORAL | Status: DC | PRN
  Administered 2019-12-12 (×3): 5 mL via ORAL
  Filled 2019-12-12 (×3): qty 5

## 2019-12-12 NOTE — Progress Notes (Addendum)
      [address]       [place] [address]             [phone]        3 Days Post-Op Procedure(s) (LRB): SUBXYPHOID PERICARDIAL WINDOW (N/A) TRANSESOPHAGEAL ECHOCARDIOGRAM (TEE) (N/A)  Subjective: Patient sitting in chair without specific complaint this am.   Objective: Vital signs in last 24 hours: Temp:  [97.4 F (36.3 C)-97.7 F (36.5 C)] 97.7 F (36.5 C) (04/25 0800) Pulse Rate:  [88-106] 98 (04/25 0538) Cardiac Rhythm: Atrial fibrillation;Bundle branch block (04/25 0705) Resp:  [6-21] 19 (04/25 0800) BP: (128-171)/(84-105) 128/94 (04/25 0800) SpO2:  [92 %-96 %] 92 % (04/25 0800)   Current Weight  12/09/19 61.2 kg       Intake/Output from previous day: 04/24 0701 - 04/25 0700 In: 64 [P.O.:720] Out: 250 [Urine:200; Chest Tube:50]   Physical Exam:  Cardiovascular: IRRR IRRR Pulmonary: Clear to auscultation bilaterally Abdomen: Soft, non tender, bowel sounds present. Extremities: No lower extremity edema. Wound: Clean and dry.  No erythema or signs of infection.  Lab Results: CBC: Recent Labs    12/10/19 0353 12/10/19 0353 12/10/19 0354 12/11/19 0215  WBC 9.9  --   --  9.8  HGB 10.9*   < > 11.9* 11.0*  HCT 35.7*   < > 35.0* 35.7*  PLT 132*  --   --  143*   < > = values in this interval not displayed.   BMET:  Recent Labs    12/10/19 0353 12/10/19 0353 12/10/19 0354 12/11/19 0215  NA 135   < > 136 140  K 3.3*   < > 3.4* 4.2  CL 99  --   --  103  CO2 24  --   --  28  GLUCOSE 189*  --   --  92  BUN 23  --   --  19  CREATININE 1.19  --   --  1.31*  CALCIUM 8.5*  --   --  8.7*   < > = values in this interval not displayed.    PT/INR:  Lab Results  Component Value Date   INR 1.2 12/07/2019   INR 1.32 03/13/2018   INR 1.42 11/27/2011   ABG:  INR: Will add last result for INR, ABG once components are confirmed Will add last 4 CBG results once components are confirmed  Assessment/Plan:  1. CV - A fib  (chronic) with CVR this am. Blood pressure better controlled with increase in Carvedilol and Lisinopril. On Carvedilol 18.75 mg bid, Lisinopril 20 mg daily as taken prior to admission. Patient will need to follow up with medical doctor after discharge. 2.  Pulmonary - On room air. Pericardial tube with 50 cc last 24 hours. CXR this am appears stable (left base opacity improved, no pneumothorax). Will remove pericardial drain  Pericardial fluid gram stain showed no organisms seen, culture no growth 2 days). Pathology showed:PERICARDIUM, BIOPSY:  - Membranous fibroconnective tissue with mild chronic inflammation,  benign.  4.  Anemia-Last H and H stable at 11 and 35.7 5. Mild thrombocytopenia-Last platelets up to 143,000 6. CKD (stage III)-Creatinine yesterday 1.31. He is on Lisinopril. His creatinine upon admission was 1.35.  Re check in am 7. Hope to discharge in am  Sharalyn Ink [facility] 12/12/2019,9:02 AM   Chart reviewed, patient examined, agree with above. Plan home tomorrow.

## 2019-12-13 ENCOUNTER — Inpatient Hospital Stay (HOSPITAL_COMMUNITY): Payer: MEDICARE

## 2019-12-13 ENCOUNTER — Ambulatory Visit: Payer: MEDICARE | Admitting: Cardiovascular Disease

## 2019-12-13 ENCOUNTER — Other Ambulatory Visit (HOSPITAL_COMMUNITY): Payer: MEDICARE

## 2019-12-13 LAB — BASIC METABOLIC PANEL
Anion gap: 12 (ref 5–15)
BUN: 26 mg/dL — ABNORMAL HIGH (ref 8–23)
CO2: 25 mmol/L (ref 22–32)
Calcium: 8.6 mg/dL — ABNORMAL LOW (ref 8.9–10.3)
Chloride: 101 mmol/L (ref 98–111)
Creatinine, Ser: 1.39 mg/dL — ABNORMAL HIGH (ref 0.61–1.24)
GFR calc Af Amer: 52 mL/min — ABNORMAL LOW (ref >60)
GFR calc non Af Amer: 45 mL/min — ABNORMAL LOW (ref >60)
Glucose, Bld: 128 mg/dL — ABNORMAL HIGH (ref 70–99)
Potassium: 3.6 mmol/L (ref 3.5–5.1)
Sodium: 138 mmol/L (ref 135–145)

## 2019-12-13 MED ORDER — LISINOPRIL 20 MG PO TABS: 10.0000 mg | ORAL_TABLET | Freq: Every day | ORAL | 1 refills | Status: DC

## 2019-12-13 MED ORDER — TRAMADOL HCL 50 MG PO TABS: 50.0000 mg | ORAL_TABLET | Freq: Four times a day (QID) | ORAL | 0 refills | Status: DC | PRN

## 2019-12-13 MED ORDER — FUROSEMIDE 40 MG PO TABS
40.0000 mg | ORAL_TABLET | Freq: Every day | ORAL | Status: DC
  Administered 2019-12-13: 40 mg via ORAL
  Filled 2019-12-13: qty 1

## 2019-12-13 MED ORDER — CARVEDILOL 6.25 MG PO TABS: 18.7500 mg | ORAL_TABLET | Freq: Two times a day (BID) | ORAL | 1 refills | Status: DC

## 2019-12-13 NOTE — Progress Notes (Signed)
4 Days Post-Op Procedure(s) (LRB): SUBXYPHOID PERICARDIAL WINDOW (N/A) TRANSESOPHAGEAL ECHOCARDIOGRAM (TEE) (N/A) Subjective: Patient stable on room air CXR this am reviewed, clear Incisions clean, dry Will DC home today Path- pericardial effusion dur to low grade inflammation, prob viral  Objective: Vital signs in last 24 hours: Temp:  [97.6 F (36.4 C)] 97.6 F (36.4 C) (04/26 0819) Pulse Rate:  [90] 90 (04/26 0820) Cardiac Rhythm: Atrial fibrillation (04/26 0819) Resp:  [17-19] 18 (04/26 0820) BP: (138-165)/(81-100) 163/94 (04/26 0820) SpO2:  [93 %-95 %] 95 % (04/26 0820)  Hemodynamic parameters for last 24 hours:  chronic afib  Intake/Output from previous day: 04/25 0701 - 04/26 0700 In: 720 [P.O.:720] Out: 2 [Chest Tube:2] Intake/Output this shift: No intake/output data recorded.       Exam    General- alert and comfortable. Incision clean, dry    Neck- no JVD, no cervical adenopathy palpable, no carotid bruit   Lungs- clear without rales, wheezes   Cor- irregular rate and rhythm, no murmur , gallop   Abdomen- soft, non-tender   Extremities - warm, non-tender, minimal edema   Neuro- oriented, appropriate, no focal weakness    Lab Results: Recent Labs    12/11/19 0215  WBC 9.8  HGB 11.0*  HCT 35.7*  PLT 143*   BMET:  Recent Labs    12/11/19 0215 12/13/19 0019  NA 140 138  K 4.2 3.6  CL 103 101  CO2 28 25  GLUCOSE 92 128*  BUN 19 26*  CREATININE 1.31* 1.39*  CALCIUM 8.7* 8.6*    PT/INR: No results for input(s): LABPROT, INR in the last 72 hours. ABG    Component Value Date/Time   PHART 7.422 12/10/2019 0354   HCO3 25.8 12/10/2019 0354   TCO2 27 12/10/2019 0354   O2SAT 97.0 12/10/2019 0354   CBG (last 3)  No results for input(s): GLUCAP in the last 72 hours.  Assessment/Plan: S/P Procedure(s) (LRB): SUBXYPHOID PERICARDIAL WINDOW (N/A) TRANSESOPHAGEAL ECHOCARDIOGRAM (TEE) (N/A) DC home Instructions reviewed with patient, daughter   LOS: 4 days    Tharon Aquas Trigt III 12/13/2019

## 2019-12-13 NOTE — Plan of Care (Signed)

## 2019-12-13 NOTE — Discharge Instructions (Signed)
Discharge Instructions:  1. You may shower on 04/27, please wash incisions daily with soap and water and keep dry.  If you wish to cover wounds with dressing you may do so but please keep clean and change daily.  No tub baths or swimming until incisions have completely healed.  If your incisions become red or develop any drainage please call our office at [phone]  2. No Driving until cleared by Dr. Lucianne Lei Trigt's office and you are no longer using narcotic pain medications  3. Fever of 101.5 for at least 24 hours with no source, please contact our office at [phone]  4. Activity- up as tolerated, please walk at least 3 times per day.  Avoid strenuous activity, no lifting, pushing, or pulling with your arms over 8-10 lbs for a minimum of 6 weeks  5. If any questions or concerns arise, please do not hesitate to contact our office at [phone]    How to [place] A dressing is a material that is placed in and over a wound. A dressing helps your wound heal by protecting it from:  Germs (bacteria).  Another injury.  Getting too dry or too wet. There are several types of wound dressings. Some examples are:  Bandages.  Gauze pads.  Foam pads.  Antimicrobial dressings. These prevent or treat infection and may contain something that kills germs (an antiseptic), such as silver or iodine.  Calcium alginate. These are general guidelines. Follow your doctor's instructions about how to care for your wound. What are the risks? It is usually safe to change your dressing. The sticky (adhesive) tape that is used with a dressing may make your skin sore or irritated, or it may cause a rash. These are the most common problems. However, more serious problems can happen, such as:  Bleeding.  Infection. Supplies needed: Set up a clean area for wound care. You will need:  A trash bag that you can throw away. Have it open and ready to use.  Hand sanitizer.  Cleaning  solution as told by your doctor. This may include: ? Germ-free (sterile) water. ? Germ-free salt water (saline). ? Wound cleanser. ? New wound dressing material. Make sure to open the dressing package so the dressing stays on the inside of the package. You may also need the following in your clean area:  A box of vinyl gloves.  Tape.  Adhesive remover.  Skin protectant. This may be a wipe, film, or spray.  Clean or germ-free scissors.  A cotton-tipped applicator. How to change your dressing How often you change your dressing will depend on your wound. Change the dressing as often as told by your doctor. Your doctor may change your dressing, or a family member, friend, or caregiver may be shown how to do it. It is important to:  Wash your hands before and after each dressing change. If you cannot use soap and water, use hand sanitizer.  Wear gloves and protective clothing while changing a dressing. This may include eye protection.  Never let anyone change your dressing if he or she has an infection, a skin problem, or a skin wound or cut of any size. Preparing to change your dressing  Take a shower before you do the first dressing change of the day. Before you shower, ask your doctor if you should put plastic leak-proof sealing wrap over your dressing to protect it.  If needed, take pain medicine 30 minutes before you change your dressing as told by  your doctor. Taking off your old dressing   Wash your hands with soap and water. Dry your hands with a clean towel. If you cannot use soap and water, use hand sanitizer.  Go to the clean area that you have set up with all the supplies you will need.  If you are using gloves, put the gloves on before you take off the dressing.  Gently take off any adhesive or tape by pulling it off in the direction of your hair growth. Only touch the outside edges of the dressing. ? If you are told to use an adhesive remover to loosen the edges of  the dressing, make sure to avoid the wound area.  Take off the dressing. If the dressing sticks to your skin, wet the dressing with the cleaner that your doctor says to use. This helps it come off more easily.  Take off any gauze or packing in your wound.  Throw the old dressing supplies into the trash bag.  Take off your gloves. To take off each glove, grab the cuff with your other hand and turn the glove inside out. Put the gloves in the trash right away.  Wash your hands with soap and water. Dry your hands with a clean towel. If you cannot use soap and water, use hand sanitizer. Cleaning your wound  Follow instructions from your doctor about how to clean your wound. This may include using the cleaner that your doctor recommends. ? You may need to use germ-free water to clean your wound if you are putting on a dressing that has silver in it.  Do not use over-the-counter medicated or antiseptic creams, sprays, liquids, or dressings unless your doctor tells you to do that.  Use a clean gauze pad to clean the area fully with the cleaner that your doctor recommends.  Throw the gauze pad into the trash bag.  Wash your hands with soap and water. Dry your hands with a clean towel. If you cannot use soap and water, use hand sanitizer. Putting on the dressing  If your doctor recommended a skin protectant, put it on the skin around the wound.  Gently pack the wound if told by your doctor.  Cover the wound with the recommended dressing. Make sure to touch only the outside edges of the dressing. Do not touch the inside of the dressing.  Attach the dressing so all sides stay in place. You may do this with medical adhesive, roll gauze, or tape. If you use tape, do not wrap the tape all the way around your arm or leg.  Take off your gloves. Put them in the trash bag with the old dressing. Tie the bag shut and throw it away.  Wash your hands with soap and water. Dry your hands with a clean  towel. If you cannot use soap and water, use hand sanitizer. Follow these instructions at home: Wound care   Check your wound every day for signs of infection, or as often as told by your doctor. Check for: ? More redness, swelling, or pain. ? More fluid or blood. ? Warmth. ? Pus or a bad smell. General instructions  Take over-the-counter and prescription medicines only as told by your doctor.  Ask your doctor if the medicine prescribed to you: ? Requires you to avoid driving or using heavy machinery. ? Can cause trouble pooping (constipation). You may need to take steps to prevent or treat trouble pooping:  Drink enough fluid to keep your pee (urine)  pale yellow.  Take over-the-counter or prescription medicines.  Eat foods that are high in fiber. These include beans, whole grains, and fresh fruits and vegetables.  Limit foods that are high in fat and sugar. These include fried or sweet foods.  Keep all follow-up visits as told by your doctor. This is important. Contact a doctor if:  You have new pain.  You have irritation, a rash, or itching around the wound or dressing.  It hurts to change your dressing.  Changing your dressing causes a lot of bleeding. Get help right away if:  You have very bad pain.  You have signs of infection, such as: ? More redness, swelling, or pain. ? More fluid or blood. ? Warmth. ? Pus or a bad smell. ? Red streaks leading from the wound. ? A fever. Summary  A dressing is a material that is placed in and over a wound.  A dressing helps your wound heal.  Wash your hands before and after each dressing change.  Follow your doctor's instructions about how to care for your wound.  Check your wound for signs of infection. This information is not intended to replace advice given to you by your health care provider. Make sure you discuss any questions you have with your health care provider. Document Revised: 04/02/2018 Document  Reviewed: 04/02/2018 Elsevier Patient Education  [place].

## 2019-12-13 NOTE — Plan of Care (Signed)

## 2019-12-14 LAB — AEROBIC/ANAEROBIC CULTURE W GRAM STAIN (SURGICAL/DEEP WOUND)
Culture: NO GROWTH
Culture: NO GROWTH

## 2019-12-16 DIAGNOSIS — M5431 Sciatica, right side: Secondary | ICD-10-CM | POA: Diagnosis not present

## 2019-12-21 ENCOUNTER — Other Ambulatory Visit: Payer: Self-pay | Admitting: Cardiothoracic Surgery

## 2019-12-21 DIAGNOSIS — I3139 Other pericardial effusion (noninflammatory): Secondary | ICD-10-CM

## 2019-12-21 DIAGNOSIS — I313 Pericardial effusion (noninflammatory): Secondary | ICD-10-CM

## 2019-12-22 ENCOUNTER — Encounter: Payer: Self-pay | Admitting: Cardiothoracic Surgery

## 2019-12-22 ENCOUNTER — Ambulatory Visit
Admission: RE | Admit: 2019-12-22 | Discharge: 2019-12-22 | Disposition: A | Discharge status: Home / Self Care | Payer: MEDICARE | Source: Ambulatory Visit | Attending: Cardiothoracic Surgery | Admitting: Cardiothoracic Surgery

## 2019-12-22 ENCOUNTER — Ambulatory Visit (INDEPENDENT_AMBULATORY_CARE_PROVIDER_SITE_OTHER): Payer: Self-pay | Admitting: Cardiothoracic Surgery

## 2019-12-22 ENCOUNTER — Other Ambulatory Visit: Payer: Self-pay

## 2019-12-22 VITALS — BP 141/91 | HR 87 | Temp 97.8°F | Resp 20 | Ht 69.0 in | Wt 135.0 lb

## 2019-12-22 DIAGNOSIS — J90 Pleural effusion, not elsewhere classified: Secondary | ICD-10-CM | POA: Diagnosis not present

## 2019-12-22 DIAGNOSIS — I3139 Other pericardial effusion (noninflammatory): Secondary | ICD-10-CM

## 2019-12-22 DIAGNOSIS — I313 Pericardial effusion (noninflammatory): Secondary | ICD-10-CM

## 2019-12-22 DIAGNOSIS — Z09 Encounter for follow-up examination after completed treatment for conditions other than malignant neoplasm: Secondary | ICD-10-CM | POA: Insufficient documentation

## 2019-12-22 NOTE — Progress Notes (Signed)
PCP is Lawerance Cruel, MD Referring Provider is Serafina Mitchell, MD  Chief Complaint  Patient presents with  . Routine Post Op    f/u from surgery with CXR s/p Subxiphoid pericardial window 12/09/19    HPI: Patient returns for surgical follow-up after subxiphoid pericardial window for recurrent pericardial effusion.  Etiology probably viral pericarditis and underlying diastolic heart failure and atrial fibrillation. Chest x-ray performed today personally reviewed showing clear lung fields and stable cardiac silhouette.  Blood pressure and heart rate normal.  Patient has had minimal pain from the incision. No symptoms of orthopnea ankle edema or PND. He remains in stable chronic atrial fibrillation. \He should be ready to resume driving in 2 weeks.   Past Medical History:  Diagnosis Date  . AAA (abdominal aortic aneurysm) ([place])   . Atherosclerotic renal artery stenosis, bilateral ([facility])    Bilateral renal angiography 11/27/11, Right 70% prox, Left 90% prox s/p LRA PTA/Stenting - 6.0x77m ;  12/12/2011 Renal Duplex: Patent LRA stent & > 60% RRA ost stenosis  . Atrial fibrillation ([facility] 11/19/2019  . Blood transfusion    S/P "lost blood due to hemorrhoids"  . Complication of anesthesia 2013   Severe Diarrhea aftter renal artery  . Dyspnea    with effusion  . Dysrhythmia    "fast one time"  . Hemorrhoids   . High cholesterol   . HTN (hypertension)   . Kidney disease, chronic, stage III (moderate, EGFR 30-59 ml/min)    [facility]  . Pericardial effusion    a. incidentally noted on renal u/s 11/2011;  b.  12/16/2011 Echo:  EF 55-60%, Gr1 DD, Mild MR, PASP 32, Mod circumferential peric effusion w/o hemodynamic compromise. Density in pericardial space - consider MRI  . Pre-diabetes   . Sciatica     Past Surgical History:  Procedure Laterality Date  . ABDOMINAL ANGIOGRAM N/A 11/27/2011   Procedure: ABDOMINAL ANGIOGRAM;  Surgeon: MSherren Mocha MD;  [facility];   Service: Cardiovascular;  Laterality: N/A;  . BIOPSY  03/13/2018   Procedure: BIOPSY;  Surgeon: SLadene Artist MD;  Location: [facility];  Service: Endoscopy;;  . CATARACT EXTRACTION W/ INTRAOCULAR LENS  IMPLANT, BILATERAL  1990's  . ESOPHAGOGASTRODUODENOSCOPY N/A 03/13/2018   Procedure: ESOPHAGOGASTRODUODENOSCOPY (EGD);  Surgeon: SLadene Artist MD;  [facility];  Service: Endoscopy;  Laterality: N/A;  . HOT HEMOSTASIS N/A 03/13/2018   Procedure: HOT HEMOSTASIS (ARGON PLASMA COAGULATION/BICAP);  Surgeon: SLadene Artist MD;  [facility];  Service: Endoscopy;  Laterality: N/A;  . NASAL SEPTUM SURGERY  1980's  . PERICARDIOCENTESIS N/A 10/27/2019   Procedure: PERICARDIOCENTESIS;  Surgeon: CSherren Mocha MD;  [facility];  Service: Cardiovascular;  Laterality: N/A;  . RENAL ANGIOGRAM Bilateral 11/27/2011   Procedure: RENAL ANGIOGRAM;  Surgeon: MSherren Mocha MD;  [facility];  Service: Cardiovascular;  Laterality: Bilateral;  possible PTA/stent  . RENAL ARTERY STENT  11/27/11  . RIGHT HEART CATH N/A 10/27/2019   Procedure: RIGHT HEART CATH;  Surgeon: CSherren Mocha MD;  [facility];  Service: Cardiovascular;  Laterality: N/A;  . SUBXYPHOID PERICARDIAL WINDOW N/A 12/09/2019   Procedure: SUBXYPHOID PERICARDIAL WINDOW;  Surgeon: VIvin Poot MD;  [facility]  Service: Thoracic;  Laterality: N/A;  . TEE WITHOUT CARDIOVERSION N/A 12/09/2019   Procedure: TRANSESOPHAGEAL ECHOCARDIOGRAM (TEE);  Surgeon: VPrescott Gum PCollier Salina MD;  [facility] [facility]  Service: Thoracic;  Laterality: N/A;    Family History  Problem Relation Age of Onset  . Hypertension Mother   . Hypertension Brother     Social History Social History   Tobacco Use  . Smoking status: Current Every Day Smoker    Years: 65.00    Types: Pipe, Cigarettes    Last attempt to quit: 08/19/1996    Years since quitting: 23.3  . Smokeless tobacco: Never Used  .  Tobacco comment: 12/07/2019=- smokes a pipe  Substance Use Topics  . Alcohol use: Yes    Alcohol/week: 10.0 standard drinks    Types: 10 Shots of liquor per week    Comment: 12/07/2019 - drinks 1 - 2 oz a day  . Drug use: No    Current Outpatient Medications  Medication Sig Dispense Refill  . carvedilol (COREG) 6.25 MG tablet Take 3 tablets (18.75 mg total) by mouth 2 (two) times daily with a meal. 90 tablet 1  . cholecalciferol (VITAMIN D) 25 MCG (1000 UNIT) tablet Take 1 tablet (1,000 Units total) by mouth daily.    . furosemide (LASIX) 20 MG tablet Take 40 mg by mouth daily.    [name] lisinopril (ZESTRIL) 20 MG tablet Take 0.5 tablets (10 mg total) by mouth daily. 30 tablet 1  . loperamide (IMODIUM) 2 MG capsule Take 1 capsule (2 mg total) by mouth as needed for diarrhea or loose stools. (Patient taking differently: Take 2 mg by mouth every 6 (six) hours as needed for diarrhea or loose stools. ) 120 capsule 1  . rosuvastatin (CRESTOR) 40 MG tablet Take 40 mg by mouth daily.    . tamsulosin (FLOMAX) 0.4 MG CAPS capsule Take 0.4 mg by mouth daily.     . traMADol (ULTRAM) 50 MG tablet Take 1 tablet (50 mg total) by mouth every 6 (six) hours as needed for severe pain. 20 tablet 0   No current facility-administered medications for this visit.    No Known Allergies  Review of Systems  Appetite improving Some days his energy level is lower than others No drainage from the incision or fever  BP (!) 141/91   Pulse 87   Temp 97.8 F (36.6 C) (Oral)   Resp 20   Ht _0  (1.753 m)   Wt 135 lb (61.2 kg)   SpO2 96% Comment: RA  BMI 19.94 kg/m  Physical Exam      Exam    General- alert and comfortable    Neck- no JVD, no cervical adenopathy palpable, no carotid bruit   Lungs- clear without rales, wheezes   Cor-irregular rate and rhythm, no murmur , gallop   Abdomen- soft, non-tender. Upper midline incision healing clean and dry. Chest tube site clean and dry.   Extremities - warm,  non-tender, minimal edema   Neuro- oriented, appropriate, no focal weakness   Diagnostic Tests: Chest x-ray today personally reviewed as noted above  Impression: Doing well after subxiphoid pericardial window for recurrent pericardial effusion. Patient will be able to lift normaly and drive in 2 weeks. Routine skin care for incision now Plan: Return with chest x-ray in 2 months for final follow-up and assessment.   Len Childs, MD [facility] [phone]

## 2019-12-24 DIAGNOSIS — N390 Urinary tract infection, site not specified: Secondary | ICD-10-CM | POA: Diagnosis not present

## 2019-12-24 DIAGNOSIS — Z09 Encounter for follow-up examination after completed treatment for conditions other than malignant neoplasm: Secondary | ICD-10-CM | POA: Diagnosis not present

## 2019-12-24 DIAGNOSIS — D649 Anemia, unspecified: Secondary | ICD-10-CM | POA: Diagnosis not present

## 2019-12-24 DIAGNOSIS — E1169 Type 2 diabetes mellitus with other specified complication: Secondary | ICD-10-CM | POA: Diagnosis not present

## 2019-12-24 DIAGNOSIS — I10 Essential (primary) hypertension: Secondary | ICD-10-CM | POA: Diagnosis not present

## 2019-12-24 DIAGNOSIS — M545 Low back pain: Secondary | ICD-10-CM | POA: Diagnosis not present

## 2019-12-24 DIAGNOSIS — I319 Disease of pericardium, unspecified: Secondary | ICD-10-CM | POA: Diagnosis not present

## 2020-01-03 ENCOUNTER — Other Ambulatory Visit: Payer: Self-pay

## 2020-01-03 ENCOUNTER — Ambulatory Visit (INDEPENDENT_AMBULATORY_CARE_PROVIDER_SITE_OTHER): Payer: MEDICARE | Admitting: Cardiovascular Disease

## 2020-01-03 ENCOUNTER — Encounter: Payer: Self-pay | Admitting: Cardiovascular Disease

## 2020-01-03 VITALS — BP 112/58 | HR 80 | Ht 69.0 in | Wt 135.0 lb

## 2020-01-03 DIAGNOSIS — I10 Essential (primary) hypertension: Secondary | ICD-10-CM

## 2020-01-03 DIAGNOSIS — I701 Atherosclerosis of renal artery: Secondary | ICD-10-CM

## 2020-01-03 DIAGNOSIS — I714 Abdominal aortic aneurysm, without rupture, unspecified: Secondary | ICD-10-CM

## 2020-01-03 DIAGNOSIS — E782 Mixed hyperlipidemia: Secondary | ICD-10-CM

## 2020-01-03 DIAGNOSIS — I313 Pericardial effusion (noninflammatory): Secondary | ICD-10-CM

## 2020-01-03 DIAGNOSIS — I3139 Other pericardial effusion (noninflammatory): Secondary | ICD-10-CM

## 2020-01-03 DIAGNOSIS — I480 Paroxysmal atrial fibrillation: Secondary | ICD-10-CM | POA: Diagnosis not present

## 2020-01-03 NOTE — Progress Notes (Signed)
Cardiology Office Note:    Date:  01/03/2020   ID:  Chris Peterson, DOB 1930/03/25, MRN [id number]  PCP:  Lawerance Cruel, MD  Cardiologist:  Sherren Mocha, MD  Electrophysiologist:  None   Referring MD: Lawerance Cruel, MD   Chief Complaint  Patient presents with  . Fatigue    History of Present Illness:    Chris Peterson is a 86 y.o. male with a hx of:  Pericardial effusion ? Status post pericardiocentesis ? Cytology negative for malignant cells  Diastolic CHF  Renal artery stenosis ? Status post left renal artery stent 2013  Abdominal aortic aneurysm ? Ultrasound 11/18/2019: 5.5 cm  Chronic kidney disease  Left renal mass ? Ultrasound 11/18/2019 3.1 cm solid mass left upper pole  Hypertension  Hyperlipidemia  The patient presents for follow-up evaluation.  He had recurrence of his pericardial effusion after pericardiocentesis.  Of note, he has had this effusion for many years.  I elected to proceed with pericardiocentesis because of an increase in the size of the effusion and worsening fatigue of the patient.  He ultimately underwent a subxiphoid pericardial window that was an uncomplicated surgery.  He has had a little bit of a challenging time recovering from surgery just due to the fatigue and weakness.  He has been seen back in surgical follow-up and is felt to be progressing reasonably well.  His incision has healed well.  During the course of his evaluation, he was found to have an enlarging renal mass and was referred to urology.  He is awaiting consultation with them.  The patient is here with his daughter today.  He continues to complain of generalized fatigue.  He denies orthopnea or PND.  Some shortness of breath with activity but stable overall.  No chest pain or pressure.  Past Medical History:  Diagnosis Date  . AAA (abdominal aortic aneurysm) ([place])   . Atherosclerotic renal artery stenosis, bilateral (HCC)    Bilateral renal angiography  11/27/11, Right 70% prox, Left 90% prox s/p LRA PTA/Stenting - 6.0x46m ;  12/12/2011 Renal Duplex: Patent LRA stent & > 60% RRA ost stenosis  . Atrial fibrillation ([place] 11/19/2019  . Blood transfusion    S/P "lost blood due to hemorrhoids"  . Complication of anesthesia 2013   Severe Diarrhea aftter renal artery  . Dyspnea    with effusion  . Dysrhythmia    "fast one time"  . Hemorrhoids   . High cholesterol   . HTN (hypertension)   . Kidney disease, chronic, stage III (moderate, EGFR 30-59 ml/min)    [facility]  . Pericardial effusion    a. incidentally noted on renal u/s 11/2011;  b.  12/16/2011 Echo:  EF 55-60%, Gr1 DD, Mild MR, PASP 32, Mod circumferential peric effusion w/o hemodynamic compromise. Density in pericardial space - consider MRI  . Pre-diabetes   . Sciatica     Past Surgical History:  Procedure Laterality Date  . ABDOMINAL ANGIOGRAM N/A 11/27/2011   Procedure: ABDOMINAL ANGIOGRAM;  Surgeon: MSherren Mocha MD;  [facility];  Service: Cardiovascular;  Laterality: N/A;  . BIOPSY  03/13/2018   Procedure: BIOPSY;  Surgeon: SLadene Artist MD;  [facility]: [facility];  Service: Endoscopy;;  . CATARACT EXTRACTION W/ INTRAOCULAR LENS  IMPLANT, BILATERAL  1990's  . ESOPHAGOGASTRODUODENOSCOPY N/A 03/13/2018   Procedure: ESOPHAGOGASTRODUODENOSCOPY (EGD);  Surgeon: SLadene Artist MD;  [facility];  Service: Endoscopy;  Laterality: N/A;  . HOT HEMOSTASIS N/A  03/13/2018   Procedure: HOT HEMOSTASIS (ARGON PLASMA COAGULATION/BICAP);  Surgeon: Ladene Artist, MD;  [facility];  Service: Endoscopy;  Laterality: N/A;  . NASAL SEPTUM SURGERY  1980's  . PERICARDIOCENTESIS N/A 10/27/2019   Procedure: PERICARDIOCENTESIS;  Surgeon: Sherren Mocha, MD;  Location: [facility] LAB;  Service: Cardiovascular;  Laterality: N/A;  . RENAL ANGIOGRAM Bilateral 11/27/2011   Procedure: RENAL ANGIOGRAM;  Surgeon: Sherren Mocha, MD;  Location: [facility] CATH LAB;   Service: Cardiovascular;  Laterality: Bilateral;  possible PTA/stent  . RENAL ARTERY STENT  11/27/11  . RIGHT HEART CATH N/A 10/27/2019   Procedure: RIGHT HEART CATH;  Surgeon: Sherren Mocha, MD;  Location: [place] CV LAB;  Service: Cardiovascular;  Laterality: N/A;  . SUBXYPHOID PERICARDIAL WINDOW N/A 12/09/2019   Procedure: SUBXYPHOID PERICARDIAL WINDOW;  Surgeon: Ivin Poot, MD;  Location: [place];  Service: Thoracic;  Laterality: N/A;  . TEE WITHOUT CARDIOVERSION N/A 12/09/2019   Procedure: TRANSESOPHAGEAL ECHOCARDIOGRAM (TEE);  Surgeon: Prescott Gum, Collier Salina, MD;  [facility];  Service: Thoracic;  Laterality: N/A;    Current Medications: Current Meds  Medication Sig  . carvedilol (COREG) 6.25 MG tablet Take 6.25 mg by mouth in the morning, at noon, in the evening, and at bedtime.  . cholecalciferol (VITAMIN D) 25 MCG (1000 UNIT) tablet Take 1 tablet (1,000 Units total) by mouth daily.  . furosemide (LASIX) 20 MG tablet Take 40 mg by mouth daily.  [name] lisinopril (ZESTRIL) 20 MG tablet Take 0.5 tablets (10 mg total) by mouth daily.  [name] loperamide (IMODIUM) 2 MG capsule Take 1 capsule (2 mg total) by mouth as needed for diarrhea or loose stools.  . rosuvastatin (CRESTOR) 40 MG tablet Take 40 mg by mouth daily.  . tamsulosin (FLOMAX) 0.4 MG CAPS capsule Take 0.4 mg by mouth daily.   . traMADol (ULTRAM) 50 MG tablet Take 1 tablet (50 mg total) by mouth every 6 (six) hours as needed for severe pain.  . [DISCONTINUED] carvedilol (COREG) 6.25 MG tablet Take 3 tablets (18.75 mg total) by mouth 2 (two) times daily with a meal.     Allergies:   Patient has no known allergies.   Social History   Socioeconomic History  . Marital status: Married    Spouse name: Not on file  . Number of children: Not on file  . Years of education: Not on file  . Highest education level: Not on file  Occupational History  . Not on file  Tobacco Use  . Smoking status: Current Every Day Smoker    Years:  65.00    Types: Pipe, Cigarettes    Last attempt to quit: 08/19/1996    Years since quitting: 23.3  . Smokeless tobacco: Never Used  . Tobacco comment: 12/07/2019=- smokes a pipe  Substance and Sexual Activity  . Alcohol use: Yes    Alcohol/week: 10.0 standard drinks    Types: 10 Shots of liquor per week    Comment: 12/07/2019 - drinks 1 - 2 oz a day  . Drug use: No  . Sexual activity: Never  Other Topics Concern  . Not on file  Social History Narrative  . Not on file   Social Determinants of Health   Financial Resource Strain:   . Difficulty of Paying Living Expenses:   Food Insecurity:   . Worried About Charity fundraiser in the Last Year:   . Arboriculturist in the Last Year:   Transportation Needs:   .  Lack of Transportation (Medical):   [name] Lack of Transportation (Non-Medical):   Physical Activity:   . Days of Exercise per Week:   . Minutes of Exercise per Session:   Stress:   . Feeling of Stress :   Social Connections:   . Frequency of Communication with Friends and Family:   . Frequency of Social Gatherings with Friends and Family:   . Attends Religious Services:   . Active Member of Clubs or Organizations:   . Attends Archivist Meetings:   [name] Marital Status:      Family History: The patient's family history includes Hypertension in his brother and mother.  ROS:   Please see the history of present illness.    All other systems reviewed and are negative.  Recent Labs: 10/04/2019: NT-Pro BNP 1,343 12/11/2019: ALT 6; Hemoglobin 11.0; Platelets 143 12/13/2019: BUN 26; Creatinine, Ser 1.39; Potassium 3.6; Sodium 138  Recent Lipid Panel No results found for: CHOL, TRIG, HDL, CHOLHDL, VLDL, LDLCALC, LDLDIRECT  Physical Exam:    VS:  BP (!) 112/58   Pulse 80   Ht '5\' 9"'$  (1.753 m)   Wt 135 lb (61.2 kg)   SpO2 100%   BMI 19.94 kg/m     Wt Readings from Last 3 Encounters:  01/03/20 135 lb (61.2 kg)  12/22/19 135 lb (61.2 kg)  12/09/19 135 lb (61.2 kg)      GEN: Thin elderly male in no acute distress HEENT: Normal NECK: No JVD; No carotid bruits LYMPHATICS: No lymphadenopathy CARDIAC: Irregularly irregular, no murmurs, rubs, gallops RESPIRATORY:  Clear to auscultation without rales, wheezing or rhonchi  ABDOMEN: Soft, non-tender, non-distended MUSCULOSKELETAL:  No edema; No deformity  SKIN: Warm and dry NEUROLOGIC:  Alert and oriented x 3 PSYCHIATRIC:  Normal affect   ASSESSMENT:    1. Pericardial effusion   2. Paroxysmal atrial fibrillation (HCC)   3. Abdominal aortic aneurysm (AAA) without rupture ([place])   4. Essential hypertension   5. Mixed hyperlipidemia    PLAN:    In order of problems listed above:  1. Now status post subxiphoid pericardial window, seems to be progressing well since surgery. This patients CHA2DS2-VASc Score and unadjusted Ischemic Stroke Rate (% per year) is equal to7.2 % stroke rate/year from a score of 5. Above score calculated as 1 point each if present [CHF, HTN, DM, Vascular=MI/PAD/Aortic Plaque, Age if 65-74, or Male] Above score calculated as 2 points each if present [Age > 75, or Stroke/TIA/TE].  We had a lengthy discussion today about stroke versus bleeding risk and considerations around oral anticoagulation.  The patient has a history of upper GI bleed 2 years ago and he has been followed by gastroenterology.  While he does have a clear indication for oral anticoagulation, he is also at high bleeding risk with his advanced age, frailty, and history of GI bleeding.  He is fairly adamant that he does not want to take oral anticoagulation with warfarin or a DOAC drug. Stable on most recent study.  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  Vascular surgery notes reviewed. Blood pressure reasonably well controlled.  Continue current medicines. Treated with a statin drug.    Medication Adjustments/Labs and Tests Ordered: Current medicines are  reviewed at length with the patient today.  Concerns regarding medicines are outlined above.  No orders of the defined types were placed in this encounter.  No orders of the defined types were placed in this encounter.  Patient Instructions  Medication Instructions:  Your provider recommends that you continue on your current medications as directed. Please refer to the Current Medication list given to you today.   *If you need a refill on your cardiac medications before your next appointment, please call your pharmacy*   Follow-Up: You have an appointment with Richardson Dopp 03/06/2020 at 10:45AM.    Signed, Sherren Mocha, MD  01/03/2020 4:49 PM    [place]

## 2020-01-03 NOTE — Patient Instructions (Addendum)
Medication Instructions:  Your provider recommends that you continue on your current medications as directed. Please refer to the Current Medication list given to you today.   *If you need a refill on your cardiac medications before your next appointment, please call your pharmacy*   Follow-Up: You have an appointment with Richardson Dopp 03/06/2020 at 10:45AM.

## 2020-01-11 ENCOUNTER — Telehealth: Payer: Self-pay | Admitting: Cardiovascular Disease

## 2020-01-11 NOTE — Telephone Encounter (Signed)
Patient following up on Urology referral. She states she was supposed to hear back from [facility] last week. She is aware that Valetta Fuller is off today.

## 2020-01-11 NOTE — Telephone Encounter (Signed)
I spoke to the patient's daughter Wells Guiles) and she was wondering if Valetta Fuller was able to get an appointment with [facility] for the patient.  I told her that I would have Valetta Fuller call her on 5/26.

## 2020-01-12 NOTE — Telephone Encounter (Signed)
Spoke with Therapist, sports at [employer] Scheduled the patient 7/1 at 2:30PM with Dr. Lovena Neighbours. Confirmed appointment with patient. He understands to call [facility] directly if he needs to reschedule [phone]).  CT results faxed per RN request.

## 2020-01-24 DIAGNOSIS — H6123 Impacted cerumen, bilateral: Secondary | ICD-10-CM | POA: Diagnosis not present

## 2020-02-22 ENCOUNTER — Other Ambulatory Visit: Payer: Self-pay | Admitting: Cardiothoracic Surgery

## 2020-02-22 DIAGNOSIS — I3139 Other pericardial effusion (noninflammatory): Secondary | ICD-10-CM

## 2020-02-23 ENCOUNTER — Other Ambulatory Visit: Payer: Self-pay

## 2020-02-23 ENCOUNTER — Ambulatory Visit (INDEPENDENT_AMBULATORY_CARE_PROVIDER_SITE_OTHER): Payer: Self-pay | Admitting: Cardiothoracic Surgery

## 2020-02-23 ENCOUNTER — Ambulatory Visit
Admission: RE | Admit: 2020-02-23 | Discharge: 2020-02-23 | Disposition: A | Discharge status: Home / Self Care | Payer: MEDICARE | Source: Ambulatory Visit | Attending: Cardiothoracic Surgery | Admitting: Cardiothoracic Surgery

## 2020-02-23 ENCOUNTER — Encounter: Payer: Self-pay | Admitting: Cardiothoracic Surgery

## 2020-02-23 VITALS — BP 132/76 | HR 77 | Temp 97.5°F | Resp 16 | Ht 69.0 in | Wt 130.0 lb

## 2020-02-23 DIAGNOSIS — E782 Mixed hyperlipidemia: Secondary | ICD-10-CM | POA: Diagnosis not present

## 2020-02-23 DIAGNOSIS — Z09 Encounter for follow-up examination after completed treatment for conditions other than malignant neoplasm: Secondary | ICD-10-CM

## 2020-02-23 DIAGNOSIS — N401 Enlarged prostate with lower urinary tract symptoms: Secondary | ICD-10-CM | POA: Diagnosis not present

## 2020-02-23 DIAGNOSIS — I313 Pericardial effusion (noninflammatory): Secondary | ICD-10-CM

## 2020-02-23 DIAGNOSIS — D649 Anemia, unspecified: Secondary | ICD-10-CM | POA: Diagnosis not present

## 2020-02-23 DIAGNOSIS — I3139 Other pericardial effusion (noninflammatory): Secondary | ICD-10-CM

## 2020-02-23 DIAGNOSIS — I319 Disease of pericardium, unspecified: Secondary | ICD-10-CM

## 2020-02-23 DIAGNOSIS — N400 Enlarged prostate without lower urinary tract symptoms: Secondary | ICD-10-CM | POA: Diagnosis not present

## 2020-02-23 DIAGNOSIS — R630 Anorexia: Secondary | ICD-10-CM

## 2020-02-23 DIAGNOSIS — E1169 Type 2 diabetes mellitus with other specified complication: Secondary | ICD-10-CM | POA: Diagnosis not present

## 2020-02-23 DIAGNOSIS — I10 Essential (primary) hypertension: Secondary | ICD-10-CM | POA: Diagnosis not present

## 2020-02-23 NOTE — Progress Notes (Signed)
PCP is Chris Cruel, MD Referring Provider is Chris Mitchell, MD  Chief Complaint  Patient presents with  . Routine Post Op    2 month f/u with CXR HX of Subxiphoid pericardial window...Chris KitchenMarland KitchenNO APPETITE...HAS SEEN PCP, NO REMEDIOES OFFERED    HPI: Scheduled postop visit for pericardial window for recurrent idiopathic pericardial effusion.  His chest x-ray taken today is clear.  No evidence of recurrent pericardial effusion.  Surgical incisions well-healed. Pathology on the tissue was mild inflammation and fluid was negative for cytology and culture.  He denies orthopnea or ankle edema.  He has generalized weakness fatigue and poor appetite.  He attributes this to his higher dose of carvedilol which was increased due to hypertension.  From 6.25 mg twice daily to 25 mg twice daily Past Medical History:  Diagnosis Date  . AAA (abdominal aortic aneurysm) ([place])   . Atherosclerotic renal artery stenosis, bilateral (HCC)    Bilateral renal angiography 11/27/11, Right 70% prox, Left 90% prox s/p LRA PTA/Stenting - 6.0x72m ;  12/12/2011 Renal Duplex: Patent LRA stent & > 60% RRA ost stenosis  . [place] 11/19[place]  . Blood transfusion    S/P "lost blood due to hemorrhoids"  . Complication of anesthesia 2013   Severe Diarrhea aftter renal artery  . Dyspnea    with effusion  . Dysrhythmia    "fast one time"  . Hemorrhoids   . High cholesterol   . HTN (hypertension)   . Kidney disease, chronic, stage III (moderate, EGFR 30-59 ml/min)    [facility]  . Pericardial effusion    a. incidentally noted on renal u/s 11/2011;  b.  12/16/2011 Echo:  EF 55-60%, Gr1 DD, Mild MR, PASP 32, Mod circumferential peric effusion w/o hemodynamic compromise. Density in pericardial space - consider MRI  . Pre-diabetes   . Sciatica     Past Surgical History:  Procedure Laterality Date  . ABDOMINAL ANGIOGRAM N/A 11/27/2011   Procedure: ABDOMINAL ANGIOGRAM;  Surgeon: MSherren Mocha  MD;  [facility];  Service: Cardiovascular;  Laterality: N/A;  . BIOPSY  03/13/2018   Procedure: BIOPSY;  Surgeon: SLadene Artist MD;  [facility];  Service: Endoscopy;;  . CATARACT EXTRACTION W/ INTRAOCULAR LENS  IMPLANT, BILATERAL  1990's  . ESOPHAGOGASTRODUODENOSCOPY N/A 03/13/2018   Procedure: ESOPHAGOGASTRODUODENOSCOPY (EGD);  Surgeon: SLadene Artist MD;  [facility];  Service: Endoscopy;  Laterality: N/A;  . HOT HEMOSTASIS N/A 03/13/2018   Procedure: HOT HEMOSTASIS (ARGON PLASMA COAGULATION/BICAP);  Surgeon: SLadene Artist MD;  [facility];  Service: Endoscopy;  Laterality: N/A;  . NASAL SEPTUM SURGERY  1980's  . PERICARDIOCENTESIS N/A 10/27/2019   Procedure: PERICARDIOCENTESIS;  Surgeon: CSherren Mocha MD;  [facility];  Service: Cardiovascular;  Laterality: N/A;  . RENAL ANGIOGRAM Bilateral 11/27/2011   Procedure: RENAL ANGIOGRAM;  Surgeon: MSherren Mocha MD;  [facility];  Service: Cardiovascular;  Laterality: Bilateral;  possible PTA/stent  . RENAL ARTERY STENT  11/27/11  . RIGHT HEART CATH N/A 10/27/2019   Procedure: RIGHT HEART CATH;  Surgeon: CSherren Mocha MD;  [place];  Service: Cardiovascular;  Laterality: N/A;  . SUBXYPHOID PERICARDIAL WINDOW N/A 12/09/2019   Procedure: SUBXYPHOID PERICARDIAL WINDOW;  Surgeon: VIvin Poot MD;  [facility]  Service: Thoracic;  Laterality: N/A;  . TEE WITHOUT CARDIOVERSION N/A 12/09/2019   Procedure: TRANSESOPHAGEAL ECHOCARDIOGRAM (TEE);  Surgeon: VPrescott Gum PCollier Salina MD;  [facility]  Service: Thoracic;  Laterality: N/A;    Family History  Problem Relation Age of Onset  . Hypertension Mother   . Hypertension Brother     Social History Social History   Tobacco Use  . Smoking status: Current Every Day Smoker    Years: 65.00    Types: Pipe, Cigarettes    Last attempt to quit: 08/19/1996    Years since quitting: 23.5  . Smokeless  tobacco: Never Used  . Tobacco comment: 12/07/2019=- smokes a pipe  Vaping Use  . Vaping Use: Never used  Substance Use Topics  . Alcohol use: Yes    Alcohol/week: 10.0 standard drinks    Types: 10 Shots of liquor per week    Comment: 12/07/2019 - drinks 1 - 2 oz a day  . Drug use: No    Current Outpatient Medications  Medication Sig Dispense Refill  . carvedilol (COREG) 6.25 MG tablet Take 25 mg by mouth in the morning and at bedtime.     . cholecalciferol (VITAMIN D) 25 MCG (1000 UNIT) tablet Take 1 tablet (1,000 Units total) by mouth daily.    . furosemide (LASIX) 20 MG tablet Take 40 mg by mouth daily.    Chris Peterson lisinopril (ZESTRIL) 20 MG tablet Take 0.5 tablets (10 mg total) by mouth daily. 30 tablet 1  . loperamide (IMODIUM) 2 MG capsule Take 1 capsule (2 mg total) by mouth as needed for diarrhea or loose stools. 120 capsule 1  . rosuvastatin (CRESTOR) 40 MG tablet Take 40 mg by mouth daily.    . tamsulosin (FLOMAX) 0.4 MG CAPS capsule Take 0.4 mg by mouth daily.     . traMADol (ULTRAM) 50 MG tablet Take 1 tablet (50 mg total) by mouth every 6 (six) hours as needed for severe pain. 20 tablet 0   No current facility-administered medications for this visit.    No Known Allergies  Review of Systems   Main complaint is lethargy weakness poor appetite and he feels poorly  BP 132/76 (BP Location: Left Arm, Patient Position: Sitting, Cuff Size: Small)   Pulse 77   Temp (!) 97.5 F (36.4 C)   Resp 16   Ht _0  (1.753 m)   Wt 130 lb (59 kg)   SpO2 96% Comment: RA  BMI 19.20 kg/m  Physical Exam       Exam    General- alert and comfortable subxiphoid incision well-healed.    Neck- no JVD, no cervical adenopathy palpable, no carotid bruit   Lungs- clear without rales, wheezes   Cor- regular rate and rhythm, no murmur , gallop   Abdomen- soft, non-tender   Extremities - warm, non-tender, minimal edema   Neuro- oriented, appropriate, no focal weakness   Diagnostic  Tests: Chest x-ray today personally reviewed showing clear lung fields normal cardiac silhouette no evidence of CHF  Impression: Patient has had a good recovery after subxiphoid pericardial window. He should have very low risk of recurrence. His main plaints are  probably secondary to side effects of the high-dose Coreg. His blood pressure is well controlled and he will contact his primary care physician for recommendations. Until his visit I told him he could reduce the Coreg to 12.5 mg twice daily Plan: Return to this office as needed.  No further restrictions on activity required.  No further x-rays needed.  Len Childs, MD [facility] [phone]

## 2020-02-28 DIAGNOSIS — E1169 Type 2 diabetes mellitus with other specified complication: Secondary | ICD-10-CM | POA: Diagnosis not present

## 2020-02-28 DIAGNOSIS — I10 Essential (primary) hypertension: Secondary | ICD-10-CM | POA: Diagnosis not present

## 2020-02-28 DIAGNOSIS — R5381 Other malaise: Secondary | ICD-10-CM | POA: Diagnosis not present

## 2020-03-05 NOTE — Progress Notes (Deleted)
Cardiology Office Note:    Date:  03/05/2020   ID:  Chris Peterson, DOB [birth year], MRN [id number]  PCP:  Lawerance Cruel, MD  Cardiologist:  Sherren Mocha, MD *** Electrophysiologist:  None   Referring MD: Lawerance Cruel, MD   Chief Complaint:  No chief complaint on file.    Patient Profile:    Chris Peterson is a 86 y.o. male with:   Pericardial effusion ? Status post pericardiocentesis ? Cytology negative for malignant cells ? S/p subxiphoid pericardial window 11/2019 (Dr. Prescott Gum)  Diastolic CHF  Renal artery stenosis ? Status post left renal artery stent 2013  Abdominal aortic aneurysm ? Ultrasound 11/18/2019: 5.5 cm  Chronic kidney disease  Left renal mass ? Ultrasound 11/18/2019 3.1 cm solid mass left upper pole  Hypertension  Hyperlipidemia   Prior CV studies: CT Chest/Abd/Pelvis 12/03/19 IMPRESSION: 1. Atherosclerosis of the thoracic aorta without evidence of aneurysm. 2. Redemonstrated aneurysmal abdominal aorta, with severe atherosclerosis and a large burden of mural thrombus. Juxtarenal and infrarenal aneurysm components detailed above are enlarged compared to prior examination dated 08/18/2017, measuring up to 5.1 x 4.9 cm and 4.8 x 4.2 cm respectively. Aortic Atherosclerosis (ICD10-I70.0). 3. Small pericardial effusion, decreased compared to prior examination of the chest dated 10/22/2019. 4. New small left pleural effusion and associated atelectasis or consolidation. 5.  Coronary artery disease. 6. Partially imaged lesion of the superior pole of the left kidney described on prior CT is a nonenhancing, benign intermediate attenuation proteinaceous or hemorrhagic cyst on current examination. 7.  Emphysema (ICD10-J43.9).  Echocardiogram 11/19/19 Lg pericardial effusion, no tamponade, EF 60-65, mod LVH, mild reduced RVSF, severe LAE, mod RAE, mild MR, mild dilation of Ao root (38 mm)  AAA [place] 11/18/2019 AAA 5.5 cm  Renal US  11/18/2019 Echogenic kidneys consistent with medical renal disease 3.1 cm mass upper pole left kidney-concerning for solid renal mass Left pleural effusion  Echocardiogram 10/27/2019 EF 60-65, small LV posterior pericardial effusion, no tamponade features  R heart catheterization 10/27/2019 Mean RA 1, RVSP 27, PASP 0, mean PA -8, mean PCWP 10 Pericardiocentesis removing 750 cc of serous fluid  Chest CT 10/22/2019 IMPRESSION: 1. Moderately large pericardial effusion. Larger than in 2018. Recent echocardiogram reports state that this effusion has been increasing in size. 2. Marked aortic calcification throughout the thoracic aorta. 3. Three-vessel coronary artery calcification. 4. Enlargement of the upper abdominal aortic aneurysm, now measuring 4.3 x 4.5 cm as compared to 4.1 x 4.0 cm on the previous exam. Recommend followup by abdomen and pelvis CTA in 6 months or as dictated based on symptoms., and vascular surgery referral/consultation if not already obtained. This recommendation follows ACR consensus guidelines: White Paper of the ACR Incidental Findings Committee II on Vascular Findings. J Am Coll Radiol 2013; 10:789-794. Aortic aneurysm NOS (ICD10-I71.9) 5. Enlarging lesion arising from the upper pole the left kidney measuring 4.3 x 3.6 cm as compared to 1.9 x 1.8 cm on the previous exam. Density value approximately 30 Hounsfield units. Further evaluation with ultrasound or renal protocol CT of the abdomen is suggested. While this may represent a hemorrhagic cyst with enlargement a solid renal neoplasm is also considered. 6. Centrilobular pulmonary emphysema.  These results will be called to the ordering clinician or representative by the Radiologist Assistant, and communication documented in the PACS or zVision Dashboard.  Aortic Atherosclerosis (ICD10-I70.0) and Emphysema (ICD10-J43.9).   Echocardiogram 10/11/2019 EF 60-65, no R WMA, mild LVH, GR 1 DD, large  pericardial effusion,  no tamponade, trivial MR, mild AI  AAA [place] 05/06/2019 AAA 4.6 cm Right RAS 1-59 Left RA stent Left kidney lesion 3 x 3 x 3.3 cm Celiac artery stenosis 70-99  History of Present Illness:    Chris Peterson underwent pericardiocentesis in 10/2019 for worsening pericardial effusion.  I saw him in 11/2019 after his pericardiocentesis.  Unfortunately, his follow up echocardiogram demonstrated a recurrent, large effusion without tamponade.  I referred him to Urology for the recently noted renal mass and to vascular surgery for his enlarging AAA. He was in new onset atrial fibrillation.  But, I did not start anticoagulation due to his recent pericardiocentesis and recurrent effusion.  He ultimately underwent subxiphoid pericardial window.  Cytology was neg.  He was last seen by Dr. Burt Knack in 12/2019.  Despite his risk of stroke, it was felt that his bleeding risk was too great and he was not started on anticoagulation.  In addition, the patient did not want to take anticoagulation.  The patient has been seen by Dr. Trula Slade with vascular surgery and is being followed for his abdominal aortic aneurysm.  When last seen, he was awaiting his appt with urology for his renal mass.  He returns for follow up.  The DICTATELATER SmartLink is not supported in this context. ***   Past Medical History:  Diagnosis Date  . AAA (abdominal aortic aneurysm) ([place])   . Atherosclerotic renal artery stenosis, bilateral (HCC)    Bilateral renal angiography 11/27/11, Right 70% prox, Left 90% prox s/p LRA PTA/Stenting - 6.0x66m ;  12/12/2011 Renal Duplex: Patent LRA stent & > 60% RRA ost stenosis  . Atrial fibrillation ([place] 11/19/2019  . Blood transfusion    S/P "lost blood due to hemorrhoids"  . Complication of anesthesia 2013   Severe Diarrhea aftter renal artery  . Dyspnea    with effusion  . Dysrhythmia    "fast one time"  . Hemorrhoids   . High cholesterol   . HTN (hypertension)   . Kidney disease,  chronic, stage III (moderate, EGFR 30-59 ml/min)    [facility]  . Pericardial effusion    a. incidentally noted on renal u/s 11/2011;  b.  12/16/2011 Echo:  EF 55-60%, Gr1 DD, Mild MR, PASP 32, Mod circumferential peric effusion w/o hemodynamic compromise. Density in pericardial space - consider MRI  . Pre-diabetes   . Sciatica     Current Medications: No outpatient medications have been marked as taking for the 03/06/20 encounter (Appointment) with WRichardson DoppT, PA-C.     Allergies:   Patient has no known allergies.   Social History   Tobacco Use  . Smoking status: Current Every Day Smoker    Years: 65.00    Types: Pipe, Cigarettes    Last attempt to quit: 08/19/1996    Years since quitting: 23.5  . Smokeless tobacco: Never Used  . Tobacco comment: 12/07/2019=- smokes a pipe  Vaping Use  . Vaping Use: Never used  Substance Use Topics  . Alcohol use: Yes    Alcohol/week: 10.0 standard drinks    Types: 10 Shots of liquor per week    Comment: 12/07/2019 - drinks 1 - 2 oz a day  . Drug use: No     Family Hx: The patient's family history includes Hypertension in his brother and mother.  ROS   EKGs/Labs/Other Test Reviewed:    EKG:  EKG is *** ordered today.  The ekg ordered today demonstrates ***  Recent Labs: 10/04/2019: NT-Pro BNP  1,343 12/11/2019: ALT 6; Hemoglobin 11.0; Platelets 143 12/13/2019: BUN 26; Creatinine, Ser 1.39; Potassium 3.6; Sodium 138   Recent Lipid Panel No results found for: CHOL, TRIG, HDL, CHOLHDL, LDLCALC, LDLDIRECT  Physical Exam:    VS:  There were no vitals taken for this visit.    Wt Readings from Last 3 Encounters:  02/23/20 130 lb (59 kg)  01/03/20 135 lb (61.2 kg)  12/22/19 135 lb (61.2 kg)     Physical Exam ***  ASSESSMENT & PLAN:    *** 1. Pericardial effusion He is s/p pericardiocentesis.  Cytology was neg.  He is feeling better.  I was able to touch base with the cardiologist reading his echocardiogram today.  This  demonstrates an EF of 60-65% and large pericardial effusion.  I reviewed his case today with Dr. Angelena Form (attending MD).  The patient does not seem to be in any distress.  He is not tachycardic or hypotensive.  It did not appear that he had evidence of tamponade on his echocardiogram.  The official read on his echocardiogram today did not show evidence of cardiac tamponade.  It appears that his effusion is larger than his post pericardiocentesis echocardiogram.  I called the patient at home after he left the office to let him know.  He may need a pericardial window.  I will touch base with Dr. Burt Knack to discuss further.  He has an appointment with Dr. Burt Knack later this month.  I have advised that he keep that appointment for now.  2. Abdominal aortic aneurysm (AAA) without rupture (HCC) His abdominal aortic aneurysm has grown to 5.5 cm.  I have recommended referral to vascular surgery for further evaluation and surveillance.  3. Left renal mass He has a 3.1 cm solid left renal mass in the upper pole.  I have recommended that we refer him to urology for further evaluation.  4. Stage 3a chronic kidney disease Recent creatinine stable.  He is followed by nephrology.  5. Paroxysmal atrial fibrillation (HCC) This is a new diagnosis.  His rate is controlled. CHA2DS2-VASc=5 (CHF, vascular disease, hypertension, age).  He would benefit from long-term anticoagulation for stroke prophylaxis.  However, given his current comorbid conditions, I do not think it is best to start anticoagulation at this time.  He has a renal mass that needs evaluation.  He also has an abdominal aortic aneurysm that is at 5.5 cm.  He has a pericardial effusion that has returned soon after a recent pericardiocentesis.  He may indeed need a pericardial window.  I reviewed his case today with Dr. Angelena Form (attending MD).  He agreed that we should not start anticoagulation at this time.  Again, I will review his case with Dr. Burt Knack.   Keep appointment later this month.  Dispo:  No follow-ups on file.   Medication Adjustments/Labs and Tests Ordered: Current medicines are reviewed at length with the patient today.  Concerns regarding medicines are outlined above.  Tests Ordered: No orders of the defined types were placed in this encounter.  Medication Changes: No orders of the defined types were placed in this encounter.   Signed, Richardson Dopp, PA-C  03/05/2020 12:32 PM    [facility] [place], [place], [place]  [address] Phone: [phone]; Fax: [phone]

## 2020-03-06 ENCOUNTER — Ambulatory Visit: Payer: MEDICARE | Admitting: Physician Assistant

## 2020-04-02 NOTE — Progress Notes (Signed)
Cardiology Office Note:    Date:  04/03/2020   ID:  Chris Peterson, DOB 1929-12-06, MRN [id number]  PCP:  Lawerance Cruel, MD  Cardiologist:  Sherren Mocha, MD   Electrophysiologist:  None   Referring MD: Lawerance Cruel, MD   Chief Complaint:  Follow-up (AFib, CHF, pericardial effusion)    Patient Profile:    Chris Peterson is a 86 y.o. male with:   Paroxysmal atrial fibrillation   Pericardial effusion ? Status post pericardiocentesis ? Cytology negative for malignant cells ? S/p subxiphoid pericardial window 11/2019 (Dr. Prescott Gum)  Diastolic CHF  Renal artery stenosis ? Status post left renal artery stent 2013  Abdominal aortic aneurysm ? Ultrasound 11/18/2019: 5.5 cm  Chronic kidney disease  Left renal mass ? Ultrasound 11/18/2019 3.1 cm solid mass left upper pole  Hypertension  Hyperlipidemia   Prior CV studies: CT Chest/Abd/Pelvis 12/03/19 IMPRESSION: 1. Atherosclerosis of the thoracic aorta without evidence of aneurysm. 2. Redemonstrated aneurysmal abdominal aorta, with severe atherosclerosis and a large burden of mural thrombus. Juxtarenal and infrarenal aneurysm components detailed above are enlarged compared to prior examination dated 08/18/2017, measuring up to 5.1 x 4.9 cm and 4.8 x 4.2 cm respectively. Aortic Atherosclerosis (ICD10-I70.0). 3. Small pericardial effusion, decreased compared to prior examination of the chest dated 10/22/2019. 4. New small left pleural effusion and associated atelectasis or consolidation. 5.  Coronary artery disease. 6. Partially imaged lesion of the superior pole of the left kidney described on prior CT is a nonenhancing, benign intermediate attenuation proteinaceous or hemorrhagic cyst on current examination. 7.  Emphysema (ICD10-J43.9).  Echocardiogram 11/19/19 Lg pericardial effusion, no tamponade, EF 60-65, mod LVH, mild reduced RVSF, severe LAE, mod RAE, mild MR, mild dilation of Ao root (38  mm)  AAA [place] 11/18/2019 AAA 5.5 cm  Renal US 11/18/2019 Echogenic kidneys consistent with medical renal disease 3.1 cm mass upper pole left kidney-concerning for solid renal mass Left pleural effusion  Echocardiogram 10/27/2019 EF 60-65, small LV posterior pericardial effusion, no tamponade features  R heart catheterization 10/27/2019 Mean RA 1, RVSP 27, PASP 0, mean PA -8, mean PCWP 10 Pericardiocentesis removing 750 cc of serous fluid  Chest CT 10/22/2019 IMPRESSION: 1. Moderately large pericardial effusion. Larger than in 2018. Recent echocardiogram reports state that this effusion has been increasing in size. 2. Marked aortic calcification throughout the thoracic aorta. 3. Three-vessel coronary artery calcification. 4. Enlargement of the upper abdominal aortic aneurysm, now measuring 4.3 x 4.5 cm as compared to 4.1 x 4.0 cm on the previous exam. Recommend followup by abdomen and pelvis CTA in 6 months or as dictated based on symptoms., and vascular surgery referral/consultation if not already obtained. This recommendation follows ACR consensus guidelines: White Paper of the ACR Incidental Findings Committee II on Vascular Findings. J Am Coll Radiol 2013; 10:789-794. Aortic aneurysm NOS (ICD10-I71.9) 5. Enlarging lesion arising from the upper pole the left kidney measuring 4.3 x 3.6 cm as compared to 1.9 x 1.8 cm on the previous exam. Density value approximately 30 Hounsfield units. Further evaluation with ultrasound or renal protocol CT of the abdomen is suggested. While this may represent a hemorrhagic cyst with enlargement a solid renal neoplasm is also considered. 6. Centrilobular pulmonary emphysema.  These results will be called to the ordering clinician or representative by the Radiologist Assistant, and communication documented in the PACS or zVision Dashboard.  Aortic Atherosclerosis (ICD10-I70.0) and Emphysema (ICD10-J43.9).   Echocardiogram 10/11/2019 EF  60-65, no R WMA, mild LVH,  GR 1 DD, large pericardial effusion, no tamponade, trivial MR, mild AI  AAA [place] 05/06/2019 AAA 4.6 cm Right RAS 1-59 Left RA stent Left kidney lesion 3 x 3 x 3.3 cm Celiac artery stenosis 70-99  History of Present Illness:    Chris Peterson underwent pericardiocentesis in 10/2019 for worsening pericardial effusion.  I saw him in 11/2019 after his pericardiocentesis.  Unfortunately, his follow up echocardiogram demonstrated a recurrent, large effusion without tamponade.  I referred him to Urology for the recently noted renal mass and to vascular surgery for his enlarging AAA. He was in new onset atrial fibrillation.  But, I did not start anticoagulation due to his recent pericardiocentesis and recurrent effusion.  He ultimately underwent subxiphoid pericardial window.  Cytology was neg.  He was last seen by Dr. Burt Knack in 12/2019.  Despite his risk of stroke, it was felt that his bleeding risk was too great and he was not started on anticoagulation.  In addition, the patient did not want to take anticoagulation.  The patient has been seen by Dr. Trula Slade with vascular surgery and is being followed for his abdominal aortic aneurysm.  When last seen, he was awaiting his appt with urology for his renal mass.  He returns for follow up.   He is here with his daughter.  He has not been having chest pain, syncope, orthopnea, significant leg swelling.  He does feel weak and does get out of breath with certain activities.  He also feels congested when he lays down.  His appetite is poor and he has been losing weight.  Past Medical History:  Diagnosis Date  . AAA (abdominal aortic aneurysm) ([place])   . Atherosclerotic renal artery stenosis, bilateral (HCC)    Bilateral renal angiography 11/27/11, Right 70% prox, Left 90% prox s/p LRA PTA/Stenting - 6.0x64m ;  12/12/2011 Renal Duplex: Patent LRA stent & > 60% RRA ost stenosis  . Atrial fibrillation ([place] 11/19/2019  . Blood transfusion    S/P  "lost blood due to hemorrhoids"  . Complication of anesthesia 2013   Severe Diarrhea aftter renal artery  . Dyspnea    with effusion  . Dysrhythmia    "fast one time"  . Hemorrhoids   . High cholesterol   . HTN (hypertension)   . Kidney disease, chronic, stage III (moderate, EGFR 30-59 ml/min)    [facility]  . Pericardial effusion    a. incidentally noted on renal u/s 11/2011;  b.  12/16/2011 Echo:  EF 55-60%, Gr1 DD, Mild MR, PASP 32, Mod circumferential peric effusion w/o hemodynamic compromise. Density in pericardial space - consider MRI  . Pre-diabetes   . Sciatica     Current Medications: Current Meds  Medication Sig  . carvedilol (COREG) 12.5 MG tablet Take 12.5 mg by mouth 2 (two) times daily.  . cholecalciferol (VITAMIN D) 25 MCG (1000 UNIT) tablet Take 1 tablet (1,000 Units total) by mouth daily.  . furosemide (LASIX) 20 MG tablet Take 40 mg by mouth daily.  .Marland Kitchenlisinopril (ZESTRIL) 20 MG tablet Take 0.5 tablets (10 mg total) by mouth daily.  . rosuvastatin (CRESTOR) 40 MG tablet Take 40 mg by mouth once a week.  . tamsulosin (FLOMAX) 0.4 MG CAPS capsule Take 0.4 mg by mouth daily.      Allergies:   Patient has no known allergies.   Social History   Tobacco Use  . Smoking status: Current Every Day Smoker    Years: 65.00    Types: Pipe,  Cigarettes    Last attempt to quit: 08/19/1996    Years since quitting: 23.6  . Smokeless tobacco: Never Used  . Tobacco comment: 12/07/2019=- smokes a pipe  Vaping Use  . Vaping Use: Never used  Substance Use Topics  . Alcohol use: Yes    Alcohol/week: 10.0 standard drinks    Types: 10 Shots of liquor per week    Comment: 12/07/2019 - drinks 1 - 2 oz a day  . Drug use: No     Family Hx: The patient's family history includes Hypertension in his brother and mother.  ROS   EKGs/Labs/Other Test Reviewed:    EKG:  EKG is not ordered today.  The ekg ordered today demonstrates n/a  Recent Labs: 10/04/2019: NT-Pro BNP  1,343 12/11/2019: ALT 6; Hemoglobin 11.0; Platelets 143 12/13/2019: BUN 26; Creatinine, Ser 1.39; Potassium 3.6; Sodium 138   Recent Lipid Panel No results found for: CHOL, TRIG, HDL, CHOLHDL, LDLCALC, LDLDIRECT  Physical Exam:    VS:  BP 122/70   Pulse (!) 50   Ht _0  (1.753 m)   Wt 128 lb (58.1 kg)   SpO2 90%   BMI 18.90 kg/m     Wt Readings from Last 3 Encounters:  04/03/20 128 lb (58.1 kg)  02/23/20 130 lb (59 kg)  01/03/20 135 lb (61.2 kg)     Constitutional:      Appearance: Healthy appearance. Not in distress.  Pulmonary:     Effort: Pulmonary effort is normal.     Breath sounds: No wheezing. No rales.  Cardiovascular:     Normal rate. Irregularly irregular rhythm. Normal S1. Normal S2.     Murmurs: There is no murmur.  Edema:    Peripheral edema absent.  Abdominal:     Palpations: Abdomen is soft.  Musculoskeletal:     Cervical back: Neck supple. Skin:    General: Skin is warm and dry.  Neurological:     General: No focal deficit present.     Mental Status: Alert and oriented to person, place and time.     Cranial Nerves: Cranial nerves are intact.       ASSESSMENT & PLAN:    1. Shortness of breath 2. Chronic diastolic CHF (congestive heart failure) ([place]) 3. Pericardial effusion He notes fatigue, weight loss and poor appetite.  He also notes some shortness of breath with certain things as well as congestion when he lays down.  His exam is not suggestive of volume excess.  I suspect his symptoms are more related to deconditioning related to poor appetite and weight loss.  He really should not have reaccumulation of his pericardial effusion since his window procedure.  He has not had a follow-up echocardiogram since his procedure.  Due to his recent symptoms, I will obtain a follow-up echocardiogram.  I have encouraged him to supplement his diet with Ensure or Boost to help with caloric intake.  He knows to contact us if he has swelling or significant worsening  in his shortness of breath.  4. Paroxysmal atrial fibrillation (HCC) His exam is suggestive of continued atrial fibrillation.  As noted previously, he is not a candidate for anticoagulation.  His rate seems to be well controlled.  5. Abdominal aortic aneurysm (AAA) without rupture ([place]) Now followed by vascular surgery (Dr. Trula Slade).  He is due for follow-up in October with repeat scans.  6. Left renal mass He notes that he was evaluated by urology and the left mass was felt to be  a benign cyst.    Dispo:  Return in about 6 months (around 10/04/2020) for Routine Follow Up, w/ Dr. Burt Knack, in person.   Medication Adjustments/Labs and Tests Ordered: Current medicines are reviewed at length with the patient today.  Concerns regarding medicines are outlined above.  Tests Ordered: Orders Placed This Encounter  Procedures  . ECHOCARDIOGRAM COMPLETE   Medication Changes: No orders of the defined types were placed in this encounter.   Signed, Richardson Dopp, PA-C  04/03/2020 12:02 PM    [facility] [place], [place], [place]  [address] Phone: [phone]; Fax: [phone]

## 2020-04-03 ENCOUNTER — Ambulatory Visit (INDEPENDENT_AMBULATORY_CARE_PROVIDER_SITE_OTHER): Payer: MEDICARE | Admitting: Physician Assistant

## 2020-04-03 ENCOUNTER — Other Ambulatory Visit: Payer: Self-pay

## 2020-04-03 ENCOUNTER — Encounter: Payer: Self-pay | Admitting: Physician Assistant

## 2020-04-03 VITALS — BP 122/70 | HR 50 | Ht 69.0 in | Wt 128.0 lb

## 2020-04-03 DIAGNOSIS — I5032 Chronic diastolic (congestive) heart failure: Secondary | ICD-10-CM | POA: Diagnosis not present

## 2020-04-03 DIAGNOSIS — I3139 Other pericardial effusion (noninflammatory): Secondary | ICD-10-CM

## 2020-04-03 DIAGNOSIS — I714 Abdominal aortic aneurysm, without rupture, unspecified: Secondary | ICD-10-CM

## 2020-04-03 DIAGNOSIS — E782 Mixed hyperlipidemia: Secondary | ICD-10-CM

## 2020-04-03 DIAGNOSIS — I313 Pericardial effusion (noninflammatory): Secondary | ICD-10-CM | POA: Diagnosis not present

## 2020-04-03 DIAGNOSIS — N2889 Other specified disorders of kidney and ureter: Secondary | ICD-10-CM

## 2020-04-03 DIAGNOSIS — I10 Essential (primary) hypertension: Secondary | ICD-10-CM

## 2020-04-03 DIAGNOSIS — I701 Atherosclerosis of renal artery: Secondary | ICD-10-CM

## 2020-04-03 DIAGNOSIS — I480 Paroxysmal atrial fibrillation: Secondary | ICD-10-CM | POA: Diagnosis not present

## 2020-04-03 DIAGNOSIS — R0602 Shortness of breath: Secondary | ICD-10-CM

## 2020-04-03 NOTE — Patient Instructions (Signed)
Medication Instructions:  Your physician recommends that you continue on your current medications as directed. Please refer to the Current Medication list given to you today.  *If you need a refill on your cardiac medications before your next appointment, please call your pharmacy*  Lab Work: None ordered today  Testing/Procedures: Your physician has requested that you have an echocardiogram. Echocardiography is a painless test that uses sound waves to create images of your heart. It provides your doctor with information about the size and shape of your heart and how well your heart's chambers and valves are working. This procedure takes approximately one hour. There are no restrictions for this procedure.  Follow-Up: At [facility], you and your health needs are our priority.  As part of our continuing mission to provide you with exceptional heart care, we have created designated Provider Care Teams.  These Care Teams include your primary Cardiologist (physician) and Advanced Practice Providers (APPs -  Physician Assistants and Nurse Practitioners) who all work together to provide you with the care you need, when you need it.  Your next appointment:   6 month(s)  The format for your next appointment:   In Person  Provider:   Sherren Mocha, MD  Other Instructions Drink Ensure or Boost if not able to eat much.

## 2020-04-17 ENCOUNTER — Ambulatory Visit: Payer: MEDICARE | Admitting: Physician Assistant

## 2020-04-17 ENCOUNTER — Other Ambulatory Visit (HOSPITAL_COMMUNITY): Payer: MEDICARE

## 2020-05-02 ENCOUNTER — Other Ambulatory Visit: Payer: Self-pay | Admitting: Physician Assistant

## 2020-05-02 ENCOUNTER — Encounter: Payer: Self-pay | Admitting: Physician Assistant

## 2020-05-02 ENCOUNTER — Other Ambulatory Visit: Payer: Self-pay

## 2020-05-02 ENCOUNTER — Ambulatory Visit (HOSPITAL_COMMUNITY): Payer: MEDICARE | Attending: Physician Assistant

## 2020-05-02 DIAGNOSIS — I5032 Chronic diastolic (congestive) heart failure: Secondary | ICD-10-CM

## 2020-05-02 DIAGNOSIS — I313 Pericardial effusion (noninflammatory): Secondary | ICD-10-CM | POA: Diagnosis not present

## 2020-05-02 DIAGNOSIS — R0602 Shortness of breath: Secondary | ICD-10-CM

## 2020-05-02 DIAGNOSIS — I3139 Other pericardial effusion (noninflammatory): Secondary | ICD-10-CM

## 2020-05-02 LAB — ECHOCARDIOGRAM LIMITED
Area-P 1/2: 3.58 cm2
P 1/2 time: 373 msec
S' Lateral: 2.2 cm

## 2020-05-25 DIAGNOSIS — E782 Mixed hyperlipidemia: Secondary | ICD-10-CM | POA: Diagnosis not present

## 2020-05-25 DIAGNOSIS — I10 Essential (primary) hypertension: Secondary | ICD-10-CM | POA: Diagnosis not present

## 2020-05-25 DIAGNOSIS — N400 Enlarged prostate without lower urinary tract symptoms: Secondary | ICD-10-CM | POA: Diagnosis not present

## 2020-05-25 DIAGNOSIS — D649 Anemia, unspecified: Secondary | ICD-10-CM | POA: Diagnosis not present

## 2020-05-25 DIAGNOSIS — N401 Enlarged prostate with lower urinary tract symptoms: Secondary | ICD-10-CM | POA: Diagnosis not present

## 2020-05-25 DIAGNOSIS — E1169 Type 2 diabetes mellitus with other specified complication: Secondary | ICD-10-CM | POA: Diagnosis not present

## 2020-05-26 ENCOUNTER — Other Ambulatory Visit: Payer: Self-pay

## 2020-05-26 DIAGNOSIS — I714 Abdominal aortic aneurysm, without rupture, unspecified: Secondary | ICD-10-CM

## 2020-06-20 DIAGNOSIS — N2581 Secondary hyperparathyroidism of renal origin: Secondary | ICD-10-CM | POA: Diagnosis not present

## 2020-06-20 DIAGNOSIS — Z0000 Encounter for general adult medical examination without abnormal findings: Secondary | ICD-10-CM | POA: Diagnosis not present

## 2020-06-20 DIAGNOSIS — N1832 Chronic kidney disease, stage 3b: Secondary | ICD-10-CM | POA: Diagnosis not present

## 2020-06-20 DIAGNOSIS — D631 Anemia in chronic kidney disease: Secondary | ICD-10-CM | POA: Diagnosis not present

## 2020-06-20 DIAGNOSIS — I129 Hypertensive chronic kidney disease with stage 1 through stage 4 chronic kidney disease, or unspecified chronic kidney disease: Secondary | ICD-10-CM | POA: Diagnosis not present

## 2020-06-20 DIAGNOSIS — N390 Urinary tract infection, site not specified: Secondary | ICD-10-CM | POA: Diagnosis not present

## 2020-06-20 DIAGNOSIS — E785 Hyperlipidemia, unspecified: Secondary | ICD-10-CM | POA: Diagnosis not present

## 2020-06-20 DIAGNOSIS — N400 Enlarged prostate without lower urinary tract symptoms: Secondary | ICD-10-CM | POA: Diagnosis not present

## 2020-06-20 DIAGNOSIS — I739 Peripheral vascular disease, unspecified: Secondary | ICD-10-CM | POA: Diagnosis not present

## 2020-06-20 DIAGNOSIS — E1129 Type 2 diabetes mellitus with other diabetic kidney complication: Secondary | ICD-10-CM | POA: Diagnosis not present

## 2020-06-20 DIAGNOSIS — I313 Pericardial effusion (noninflammatory): Secondary | ICD-10-CM | POA: Diagnosis not present

## 2020-06-20 DIAGNOSIS — E1122 Type 2 diabetes mellitus with diabetic chronic kidney disease: Secondary | ICD-10-CM | POA: Diagnosis not present

## 2020-06-20 DIAGNOSIS — N189 Chronic kidney disease, unspecified: Secondary | ICD-10-CM | POA: Diagnosis not present

## 2020-06-20 DIAGNOSIS — I701 Atherosclerosis of renal artery: Secondary | ICD-10-CM | POA: Diagnosis not present

## 2020-06-22 ENCOUNTER — Ambulatory Visit
Admission: RE | Admit: 2020-06-22 | Discharge: 2020-06-22 | Disposition: A | Discharge status: Home / Self Care | Payer: MEDICARE | Source: Ambulatory Visit | Attending: Surgery | Admitting: Surgery

## 2020-06-22 ENCOUNTER — Other Ambulatory Visit: Payer: Self-pay | Admitting: Surgery

## 2020-06-22 ENCOUNTER — Telehealth: Payer: Self-pay

## 2020-06-22 DIAGNOSIS — I714 Abdominal aortic aneurysm, without rupture, unspecified: Secondary | ICD-10-CM

## 2020-06-22 NOTE — Telephone Encounter (Signed)
Rebecca @ [facility] called to report pt creatinine was 2.1 and they are unable to administer contrast - called Dr. Trula Slade gave verbal ok to proceed with non contrasted CT chest/abd/pelvis.

## 2020-06-26 ENCOUNTER — Ambulatory Visit (INDEPENDENT_AMBULATORY_CARE_PROVIDER_SITE_OTHER): Payer: MEDICARE | Admitting: Surgery

## 2020-06-26 ENCOUNTER — Encounter: Payer: Self-pay | Admitting: Surgery

## 2020-06-26 ENCOUNTER — Other Ambulatory Visit: Payer: Self-pay

## 2020-06-26 VITALS — BP 138/87 | HR 87 | Temp 97.4°F | Resp 20 | Ht 69.0 in | Wt 132.0 lb

## 2020-06-26 DIAGNOSIS — I701 Atherosclerosis of renal artery: Secondary | ICD-10-CM | POA: Diagnosis not present

## 2020-06-26 DIAGNOSIS — I714 Abdominal aortic aneurysm, without rupture, unspecified: Secondary | ICD-10-CM

## 2020-06-26 NOTE — Progress Notes (Signed)
[facility] [place]  Patient name: Chris Peterson MRN: [id number] DOB: 01-Apr-1930 Sex: male   REASON FOR VISIT:    Follow up  HISOTRY OF PRESENT ILLNESS:    [name] a 86 y.o.male, who I met in April of 2021 for evaluation of a 5.5 cm aneurysm.The patient denies any abdominal pain. A noncontrast CT scan in 2018 showed a 3.8 cm aneurysm.  In order to adequately define size of his aneurysm, I sent him for CT scan.  Maximum aortic diameter was 5.1 cm.  This was a juxta-renal aneurysm, that lilly will require a 4 vessel branch device.  He is back for follow up  He was  recently diagnosed with new onset atrial fibrillation. Anticoagulation was not started because of his comorbidities. Earlier this year he required pericardiocentesis for an effusion.  He suffers from diastolic heart failure.He has undergone left renal artery stenting in 2013 for renal artery stenosis. He has chronic renal insufficiency, stage III with a most recent creatinine of 1.49 he is medically managed for hypertension with an ACE inhibitor. He takes a statin for hypercholesterolemia. He is a current smoker   PAST MEDICAL HISTORY:   Past Medical History:  Diagnosis Date  . AAA (abdominal aortic aneurysm) ([place])   . Atherosclerotic renal artery stenosis, bilateral (HCC)    Bilateral renal angiography 11/27/11, Right 70% prox, Left 90% prox s/p LRA PTA/Stenting - 6.0x82mm ;  12/12/2011 Renal Duplex: Patent LRA stent & > 60% RRA ost stenosis  . Atrial fibrillation ([place]) 11/19/2019  . Blood transfusion    S/P "lost blood due to hemorrhoids"  . Complication of anesthesia 2013   Severe Diarrhea aftter renal artery  . Dyspnea    with effusion  . Dysrhythmia    "fast one time"  . Hemorrhoids   . High cholesterol   . HTN (hypertension)   . Kidney disease, chronic, stage III (moderate, EGFR 30-59 ml/min) (HCC)    sees [facility]  .  Pericardial effusion    s/p pericardiocentesis 11/2019 // reaccumulated >> s/p window 12/2019 // Echo 9/21: EF 60-65, no RWMA, moderate LVH, normal RVSF, severe BAE, small effusion posterior to the left ventricle (no evidence of tamponade), mild-moderate MR  . Pre-diabetes   . Sciatica      FAMILY HISTORY:   Family History  Problem Relation Age of Onset  . Hypertension Mother   . Hypertension Brother     SOCIAL HISTORY:   Social History   Tobacco Use  . Smoking status: Current Every Day Smoker    Years: 65.00    Types: Pipe, Cigarettes    Last attempt to quit: 08/19/1996    Years since quitting: 23.8  . Smokeless tobacco: Never Used  . Tobacco comment: 12/07/2019=- smokes a pipe  Substance Use Topics  . Alcohol use: Yes    Alcohol/week: 10.0 standard drinks    Types: 10 Shots of liquor per week    Comment: 12/07/2019 - drinks 1 - 2 oz a day     ALLERGIES:   No Known Allergies   CURRENT MEDICATIONS:   Current Outpatient Medications  Medication Sig Dispense Refill  . carvedilol (COREG) 12.5 MG tablet Take 12.5 mg by mouth 2 (two) times daily.    . cholecalciferol (VITAMIN D) 25 MCG (1000 UNIT) tablet Take 1 tablet (1,000 Units total) by mouth daily.    . furosemide (LASIX) 40 MG tablet Take 40 mg by mouth daily.    [name] lisinopril (  ZESTRIL) 10 MG tablet Take 10 mg by mouth daily.    . rosuvastatin (CRESTOR) 40 MG tablet Take 40 mg by mouth once a week.    . tamsulosin (FLOMAX) 0.4 MG CAPS capsule Take 0.4 mg by mouth daily.      No current facility-administered medications for this visit.    REVIEW OF SYSTEMS:   '[X]'$  denotes positive finding, $RemoveBeforeDEI'[ ]'QMAKRBZCuiyjqeCn$  denotes negative finding Cardiac  Comments:  Chest pain or chest pressure:    Shortness of breath upon exertion:    Short of breath when lying flat:    Irregular heart rhythm:        Vascular    Pain in calf, thigh, or hip brought on by ambulation:    Pain in feet at night that wakes you up from your sleep:     Blood  clot in your veins:    Leg swelling:         Pulmonary    Oxygen at home:    Productive cough:     Wheezing:         Neurologic    Sudden weakness in arms or legs:  x   Sudden numbness in arms or legs:  x   Sudden onset of difficulty speaking or slurred speech:    Temporary loss of vision in one eye:     Problems with dizziness:         Gastrointestinal    Blood in stool:     Vomited blood:         Genitourinary    Burning when urinating:     Blood in urine:        Psychiatric    Major depression:         Hematologic    Bleeding problems:    Problems with blood clotting too easily:        Skin    Rashes or ulcers:        Constitutional    Fever or chills:      PHYSICAL EXAM:   Vitals:   06/26/20 1412  BP: 138/87  Pulse: 87  Resp: 20  Temp: (!) 97.4 F (36.3 C)  SpO2: 97%  Weight: 132 lb (59.9 kg)  Height: $Remove'5\' 9"'gkSAoCr$  (1.753 m)    GENERAL: The patient is a well-nourished male, in no acute distress. The vital signs are documented above. CARDIAC: There is a regular rate and rhythm.  PULMONARY: Non-labored respirations ABDOMEN: Soft and non-tender with normal pitched bowel sounds.  MUSCULOSKELETAL: There are no major deformities or cyanosis. NEUROLOGIC: No focal weakness or paresthesias are detected. SKIN: There are no ulcers or rashes noted. PSYCHIATRIC: The patient has a normal affect.  STUDIES:   I have reviewed the following CTA: Chest CT impression  1. No evidence of thoracic aortic aneurysm. 2. Cardiomegaly with extensive coronary artery calcifications. 3. Near complete resolution of previously noted pericardial effusion. 4. Aortic Atherosclerosis (ICD10-I70.0) and Emphysema (ICD10-J43.9).  Abdominal CT impression:  1. Grossly unchanged complex bilobed juxtarenal and infrarenal abdominal aortic aneurysm with juxtarenal component measuring approximately 5.3 cm and 4.1 cm in maximal diameter respectively, grossly unchanged compared to the  11/2019 examination. Recommend follow-up CT (preferably CTA) of the abdomen and pelvis in 3-6 months and vascular surgery referral/consultation. This recommendation follows ACR consensus guidelines: White Paper of the ACR Incidental Findings Committee II on Vascular Findings. J Am Coll Radiol 2013; 10:789-794. Aortic aneurysm NOS (ICD10-I71.9). 2. Grossly unchanged approximately 1.4 cm focal contained penetrating atherosclerotic ulcer  involving the anterior cranial aspect of the abdominal aorta, stable since the 10/22/2019 examination. 3. Prostatomegaly with mass effect on the undersurface of the urinary bladder. 4. Extensive colonic diverticulosis without evidence of superimposed acute diverticulitis on this noncontrast examination.   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 Surgery will carry significant risk. He is not a candidate for open repair because of his comorbidities. I only option would be a branched graft device for which he would need to go to [facility]. I have him scheduled for follow-up with me again in 6 months with a repeat CT scan without contrast.    Leia Alf, MD, FACS [facility] [phone] Pager [phone]

## 2020-07-25 DIAGNOSIS — N400 Enlarged prostate without lower urinary tract symptoms: Secondary | ICD-10-CM | POA: Diagnosis not present

## 2020-07-25 DIAGNOSIS — I480 Paroxysmal atrial fibrillation: Secondary | ICD-10-CM | POA: Diagnosis not present

## 2020-07-25 DIAGNOSIS — D6869 Other thrombophilia: Secondary | ICD-10-CM | POA: Diagnosis not present

## 2020-07-25 DIAGNOSIS — L570 Actinic keratosis: Secondary | ICD-10-CM | POA: Diagnosis not present

## 2020-07-25 DIAGNOSIS — M5430 Sciatica, unspecified side: Secondary | ICD-10-CM | POA: Diagnosis not present

## 2020-07-25 DIAGNOSIS — E782 Mixed hyperlipidemia: Secondary | ICD-10-CM | POA: Diagnosis not present

## 2020-07-25 DIAGNOSIS — E1169 Type 2 diabetes mellitus with other specified complication: Secondary | ICD-10-CM | POA: Diagnosis not present

## 2020-07-25 DIAGNOSIS — Z0000 Encounter for general adult medical examination without abnormal findings: Secondary | ICD-10-CM | POA: Diagnosis not present

## 2020-07-25 DIAGNOSIS — D649 Anemia, unspecified: Secondary | ICD-10-CM | POA: Diagnosis not present

## 2020-07-25 DIAGNOSIS — I10 Essential (primary) hypertension: Secondary | ICD-10-CM | POA: Diagnosis not present

## 2020-07-25 DIAGNOSIS — K219 Gastro-esophageal reflux disease without esophagitis: Secondary | ICD-10-CM | POA: Diagnosis not present

## 2020-08-02 ENCOUNTER — Encounter (HOSPITAL_COMMUNITY): Payer: Self-pay

## 2020-08-02 ENCOUNTER — Other Ambulatory Visit: Payer: Self-pay

## 2020-08-02 ENCOUNTER — Inpatient Hospital Stay (HOSPITAL_COMMUNITY): Payer: MEDICARE

## 2020-08-02 ENCOUNTER — Emergency Department (HOSPITAL_COMMUNITY): Payer: MEDICARE

## 2020-08-02 ENCOUNTER — Inpatient Hospital Stay (HOSPITAL_COMMUNITY)
Admission: EM | Admit: 2020-08-02 | Discharge: 2020-08-14 | DRG: 065 | Disposition: A | Discharge status: Skilled Nursing Facility | Payer: MEDICARE | Attending: Internal Medicine | Admitting: Internal Medicine

## 2020-08-02 ENCOUNTER — Telehealth: Payer: Self-pay | Admitting: Cardiovascular Disease

## 2020-08-02 DIAGNOSIS — E785 Hyperlipidemia, unspecified: Secondary | ICD-10-CM

## 2020-08-02 DIAGNOSIS — I351 Nonrheumatic aortic (valve) insufficiency: Secondary | ICD-10-CM | POA: Diagnosis not present

## 2020-08-02 DIAGNOSIS — Z9841 Cataract extraction status, right eye: Secondary | ICD-10-CM | POA: Diagnosis not present

## 2020-08-02 DIAGNOSIS — I4891 Unspecified atrial fibrillation: Secondary | ICD-10-CM | POA: Diagnosis not present

## 2020-08-02 DIAGNOSIS — Z7401 Bed confinement status: Secondary | ICD-10-CM | POA: Diagnosis not present

## 2020-08-02 DIAGNOSIS — R001 Bradycardia, unspecified: Secondary | ICD-10-CM | POA: Diagnosis not present

## 2020-08-02 DIAGNOSIS — Z20822 Contact with and (suspected) exposure to covid-19: Secondary | ICD-10-CM | POA: Diagnosis present

## 2020-08-02 DIAGNOSIS — I10 Essential (primary) hypertension: Secondary | ICD-10-CM

## 2020-08-02 DIAGNOSIS — R2689 Other abnormalities of gait and mobility: Secondary | ICD-10-CM | POA: Diagnosis present

## 2020-08-02 DIAGNOSIS — R4182 Altered mental status, unspecified: Secondary | ICD-10-CM | POA: Diagnosis not present

## 2020-08-02 DIAGNOSIS — Z9842 Cataract extraction status, left eye: Secondary | ICD-10-CM

## 2020-08-02 DIAGNOSIS — I480 Paroxysmal atrial fibrillation: Secondary | ICD-10-CM | POA: Diagnosis present

## 2020-08-02 DIAGNOSIS — N400 Enlarged prostate without lower urinary tract symptoms: Secondary | ICD-10-CM | POA: Diagnosis present

## 2020-08-02 DIAGNOSIS — I63432 Cerebral infarction due to embolism of left posterior cerebral artery: Secondary | ICD-10-CM | POA: Diagnosis present

## 2020-08-02 DIAGNOSIS — H538 Other visual disturbances: Secondary | ICD-10-CM | POA: Diagnosis not present

## 2020-08-02 DIAGNOSIS — Z961 Presence of intraocular lens: Secondary | ICD-10-CM | POA: Diagnosis present

## 2020-08-02 DIAGNOSIS — R2681 Unsteadiness on feet: Secondary | ICD-10-CM | POA: Diagnosis present

## 2020-08-02 DIAGNOSIS — G8191 Hemiplegia, unspecified affecting right dominant side: Secondary | ICD-10-CM | POA: Diagnosis present

## 2020-08-02 DIAGNOSIS — I718 Aortic aneurysm of unspecified site, ruptured: Secondary | ICD-10-CM | POA: Diagnosis present

## 2020-08-02 DIAGNOSIS — M6281 Muscle weakness (generalized): Secondary | ICD-10-CM | POA: Diagnosis present

## 2020-08-02 DIAGNOSIS — Z66 Do not resuscitate: Secondary | ICD-10-CM | POA: Diagnosis present

## 2020-08-02 DIAGNOSIS — I63332 Cerebral infarction due to thrombosis of left posterior cerebral artery: Principal | ICD-10-CM | POA: Diagnosis present

## 2020-08-02 DIAGNOSIS — R471 Dysarthria and anarthria: Secondary | ICD-10-CM | POA: Diagnosis present

## 2020-08-02 DIAGNOSIS — I63431 Cerebral infarction due to embolism of right posterior cerebral artery: Secondary | ICD-10-CM | POA: Diagnosis present

## 2020-08-02 DIAGNOSIS — I714 Abdominal aortic aneurysm, without rupture, unspecified: Secondary | ICD-10-CM

## 2020-08-02 DIAGNOSIS — I6389 Other cerebral infarction: Secondary | ICD-10-CM | POA: Diagnosis not present

## 2020-08-02 DIAGNOSIS — H547 Unspecified visual loss: Secondary | ICD-10-CM | POA: Diagnosis present

## 2020-08-02 DIAGNOSIS — E1122 Type 2 diabetes mellitus with diabetic chronic kidney disease: Secondary | ICD-10-CM | POA: Diagnosis present

## 2020-08-02 DIAGNOSIS — R404 Transient alteration of awareness: Secondary | ICD-10-CM | POA: Diagnosis not present

## 2020-08-02 DIAGNOSIS — R410 Disorientation, unspecified: Secondary | ICD-10-CM | POA: Diagnosis not present

## 2020-08-02 DIAGNOSIS — N179 Acute kidney failure, unspecified: Secondary | ICD-10-CM | POA: Diagnosis present

## 2020-08-02 DIAGNOSIS — F05 Delirium due to known physiological condition: Secondary | ICD-10-CM

## 2020-08-02 DIAGNOSIS — Z79899 Other long term (current) drug therapy: Secondary | ICD-10-CM | POA: Diagnosis not present

## 2020-08-02 DIAGNOSIS — I361 Nonrheumatic tricuspid (valve) insufficiency: Secondary | ICD-10-CM | POA: Diagnosis not present

## 2020-08-02 DIAGNOSIS — I129 Hypertensive chronic kidney disease with stage 1 through stage 4 chronic kidney disease, or unspecified chronic kidney disease: Secondary | ICD-10-CM | POA: Diagnosis present

## 2020-08-02 DIAGNOSIS — R29810 Facial weakness: Secondary | ICD-10-CM | POA: Diagnosis not present

## 2020-08-02 DIAGNOSIS — Z720 Tobacco use: Secondary | ICD-10-CM | POA: Diagnosis present

## 2020-08-02 DIAGNOSIS — R42 Dizziness and giddiness: Secondary | ICD-10-CM | POA: Diagnosis not present

## 2020-08-02 DIAGNOSIS — I639 Cerebral infarction, unspecified: Secondary | ICD-10-CM | POA: Diagnosis present

## 2020-08-02 DIAGNOSIS — Z8249 Family history of ischemic heart disease and other diseases of the circulatory system: Secondary | ICD-10-CM

## 2020-08-02 DIAGNOSIS — H9190 Unspecified hearing loss, unspecified ear: Secondary | ICD-10-CM | POA: Diagnosis present

## 2020-08-02 DIAGNOSIS — E7800 Pure hypercholesterolemia, unspecified: Secondary | ICD-10-CM | POA: Diagnosis present

## 2020-08-02 DIAGNOSIS — I701 Atherosclerosis of renal artery: Secondary | ICD-10-CM | POA: Diagnosis present

## 2020-08-02 DIAGNOSIS — F17210 Nicotine dependence, cigarettes, uncomplicated: Secondary | ICD-10-CM | POA: Diagnosis present

## 2020-08-02 DIAGNOSIS — I6523 Occlusion and stenosis of bilateral carotid arteries: Secondary | ICD-10-CM | POA: Diagnosis not present

## 2020-08-02 DIAGNOSIS — N1832 Chronic kidney disease, stage 3b: Secondary | ICD-10-CM

## 2020-08-02 DIAGNOSIS — F1010 Alcohol abuse, uncomplicated: Secondary | ICD-10-CM

## 2020-08-02 DIAGNOSIS — Z8679 Personal history of other diseases of the circulatory system: Secondary | ICD-10-CM

## 2020-08-02 DIAGNOSIS — I6621 Occlusion and stenosis of right posterior cerebral artery: Secondary | ICD-10-CM | POA: Diagnosis not present

## 2020-08-02 DIAGNOSIS — Z8719 Personal history of other diseases of the digestive system: Secondary | ICD-10-CM | POA: Diagnosis not present

## 2020-08-02 DIAGNOSIS — I63412 Cerebral infarction due to embolism of left middle cerebral artery: Secondary | ICD-10-CM | POA: Diagnosis present

## 2020-08-02 DIAGNOSIS — I482 Chronic atrial fibrillation, unspecified: Secondary | ICD-10-CM | POA: Diagnosis present

## 2020-08-02 DIAGNOSIS — R29702 NIHSS score 2: Secondary | ICD-10-CM | POA: Diagnosis present

## 2020-08-02 DIAGNOSIS — I340 Nonrheumatic mitral (valve) insufficiency: Secondary | ICD-10-CM | POA: Diagnosis not present

## 2020-08-02 DIAGNOSIS — K5900 Constipation, unspecified: Secondary | ICD-10-CM | POA: Diagnosis not present

## 2020-08-02 DIAGNOSIS — R531 Weakness: Secondary | ICD-10-CM | POA: Diagnosis not present

## 2020-08-02 DIAGNOSIS — M2550 Pain in unspecified joint: Secondary | ICD-10-CM | POA: Diagnosis not present

## 2020-08-02 LAB — COMPREHENSIVE METABOLIC PANEL
ALT: 18 U/L (ref 0–44)
AST: 28 U/L (ref 15–41)
Albumin: 3 g/dL — ABNORMAL LOW (ref 3.5–5.0)
Alkaline Phosphatase: 47 U/L (ref 38–126)
Anion gap: 10 (ref 5–15)
BUN: 38 mg/dL — ABNORMAL HIGH (ref 8–23)
CO2: 27 mmol/L (ref 22–32)
Calcium: 9 mg/dL (ref 8.9–10.3)
Chloride: 106 mmol/L (ref 98–111)
Creatinine, Ser: 1.8 mg/dL — ABNORMAL HIGH (ref 0.61–1.24)
GFR, Estimated: 36 mL/min — ABNORMAL LOW (ref >60)
Glucose, Bld: 159 mg/dL — ABNORMAL HIGH (ref 70–99)
Potassium: 3.7 mmol/L (ref 3.5–5.1)
Sodium: 143 mmol/L (ref 135–145)
Total Bilirubin: 0.9 mg/dL (ref 0.3–1.2)
Total Protein: 6.2 g/dL — ABNORMAL LOW (ref 6.5–8.1)

## 2020-08-02 LAB — CBC
HCT: 35.1 % — ABNORMAL LOW (ref 39.0–52.0)
Hemoglobin: 10.9 g/dL — ABNORMAL LOW (ref 13.0–17.0)
MCH: 26.9 pg (ref 26.0–34.0)
MCHC: 31.1 g/dL (ref 30.0–36.0)
MCV: 86.7 fL (ref 80.0–100.0)
Platelets: 134 10*3/uL — ABNORMAL LOW (ref 150–400)
RBC: 4.05 MIL/uL — ABNORMAL LOW (ref 4.22–5.81)
RDW: 16.2 % — ABNORMAL HIGH (ref 11.5–15.5)
WBC: 5.2 10*3/uL (ref 4.0–10.5)
nRBC: 0 % (ref 0.0–0.2)

## 2020-08-02 LAB — DIFFERENTIAL
Abs Immature Granulocytes: 0 10*3/uL (ref 0.00–0.07)
Basophils Absolute: 0 10*3/uL (ref 0.0–0.1)
Basophils Relative: 0 %
Eosinophils Absolute: 0 10*3/uL (ref 0.0–0.5)
Eosinophils Relative: 0 %
Lymphocytes Relative: 36 %
Lymphs Abs: 1.9 10*3/uL (ref 0.7–4.0)
Monocytes Absolute: 0.4 10*3/uL (ref 0.1–1.0)
Monocytes Relative: 7 %
Neutro Abs: 3 10*3/uL (ref 1.7–7.7)
Neutrophils Relative %: 57 %
nRBC: 0 /100 WBC

## 2020-08-02 LAB — URINALYSIS, ROUTINE W REFLEX MICROSCOPIC
Bilirubin Urine: NEGATIVE
Glucose, UA: NEGATIVE mg/dL
Hgb urine dipstick: NEGATIVE
Ketones, ur: NEGATIVE mg/dL
Leukocytes,Ua: NEGATIVE
Nitrite: NEGATIVE
Protein, ur: NEGATIVE mg/dL
Specific Gravity, Urine: 1.008 (ref 1.005–1.030)
pH: 7 (ref 5.0–8.0)

## 2020-08-02 LAB — PROTIME-INR
INR: 1.2 (ref 0.8–1.2)
Prothrombin Time: 14.8 seconds (ref 11.4–15.2)

## 2020-08-02 LAB — RESP PANEL BY RT-PCR (FLU A&B, COVID) ARPGX2
Influenza A by PCR: NEGATIVE
Influenza B by PCR: NEGATIVE
SARS Coronavirus 2 by RT PCR: NEGATIVE

## 2020-08-02 LAB — APTT: aPTT: 33 seconds (ref 24–36)

## 2020-08-02 MED ORDER — TAMSULOSIN HCL 0.4 MG PO CAPS
0.4000 mg | ORAL_CAPSULE | Freq: Every day | ORAL | Status: DC
  Administered 2020-08-02 – 2020-08-14 (×13): 0.4 mg via ORAL
  Filled 2020-08-02 (×13): qty 1

## 2020-08-02 MED ORDER — STROKE: EARLY STAGES OF RECOVERY BOOK
Freq: Once | Status: AC
  Filled 2020-08-02 (×2): qty 1

## 2020-08-02 MED ORDER — GADOBUTROL 1 MMOL/ML IV SOLN
6.0000 mL | Freq: Once | INTRAVENOUS | Status: AC | PRN
  Administered 2020-08-02: 6 mL via INTRAVENOUS

## 2020-08-02 MED ORDER — SENNOSIDES-DOCUSATE SODIUM 8.6-50 MG PO TABS: 1.0000 | ORAL_TABLET | Freq: Every evening | ORAL | Status: DC | PRN

## 2020-08-02 MED ORDER — ASPIRIN 325 MG PO TABS
325.0000 mg | ORAL_TABLET | Freq: Every day | ORAL | Status: DC
  Administered 2020-08-02 – 2020-08-05 (×4): 325 mg via ORAL
  Filled 2020-08-02 (×4): qty 1

## 2020-08-02 MED ORDER — ACETAMINOPHEN 160 MG/5ML PO SOLN: 650.0000 mg | ORAL | Status: DC | PRN

## 2020-08-02 MED ORDER — ROSUVASTATIN CALCIUM 20 MG PO TABS
40.0000 mg | ORAL_TABLET | Freq: Every day | ORAL | Status: DC
  Administered 2020-08-03 – 2020-08-14 (×12): 40 mg via ORAL
  Filled 2020-08-02 (×12): qty 2

## 2020-08-02 MED ORDER — ACETAMINOPHEN 650 MG RE SUPP: 650.0000 mg | RECTAL | Status: DC | PRN

## 2020-08-02 MED ORDER — SODIUM CHLORIDE 0.9 % IV SOLN: INTRAVENOUS | Status: DC

## 2020-08-02 MED ORDER — METOPROLOL TARTRATE 5 MG/5ML IV SOLN: 2.5000 mg | Freq: Four times a day (QID) | INTRAVENOUS | Status: DC | PRN

## 2020-08-02 MED ORDER — PANTOPRAZOLE SODIUM 40 MG PO TBEC
40.0000 mg | DELAYED_RELEASE_TABLET | Freq: Every day | ORAL | Status: DC
  Administered 2020-08-02 – 2020-08-14 (×13): 40 mg via ORAL
  Filled 2020-08-02 (×13): qty 1

## 2020-08-02 MED ORDER — VITAMIN D 25 MCG (1000 UNIT) PO TABS
1000.0000 [IU] | ORAL_TABLET | Freq: Every day | ORAL | Status: DC
  Administered 2020-08-03 – 2020-08-14 (×12): 1000 [IU] via ORAL
  Filled 2020-08-02 (×20): qty 1

## 2020-08-02 MED ORDER — ACETAMINOPHEN 325 MG PO TABS
650.0000 mg | ORAL_TABLET | ORAL | Status: DC | PRN
  Administered 2020-08-10: 650 mg via ORAL
  Filled 2020-08-02: qty 2

## 2020-08-02 MED ORDER — HEPARIN SODIUM (PORCINE) 5000 UNIT/ML IJ SOLN
5000.0000 [IU] | Freq: Two times a day (BID) | INTRAMUSCULAR | Status: DC
  Administered 2020-08-02 (×2): 5000 [IU] via SUBCUTANEOUS
  Filled 2020-08-02 (×2): qty 1

## 2020-08-02 NOTE — ED Notes (Signed)
Pt back from MRI 

## 2020-08-02 NOTE — ED Notes (Signed)
Please call daughter Joesph July @ [phone] for a status update

## 2020-08-02 NOTE — H&P (Addendum)
History and Physical    Chris Peterson OVF:[id number] DOB: 1930/05/15 DOA: 08/02/2020  PCP: Lawerance Cruel, MD (Confirm with patient/family/NH records and if not entered, this has to be entered at [facility] point of entry) Patient coming from: Home  I have personally briefly reviewed patient's old medical records in [place]  Chief Complaint: Feeling better  HPI: Chris Peterson is a 86 y.o. male with medical history significant of paroxysmal A. fib not on anticoagulation or aspirin, HTN, CKD stage IIIB, AAA, HLD, GI bleed, chronic anemia, presented with strokelike symptoms.  Patient was last seen normal last night, patient woke up this morning around 730 complaining about partial vision loss on the both eyes which is new.  In addition, patient also felt lightheaded and family reported patient was confused.  About 30 minutes later, when EMS arrived, family found patient speech was slurred and continue to have confusion.  Patient reported his vision however has largely recovered.  EMS also reported patient appeared to have slight left-sided weakness.  ED Course: ED physician noticed the patient has left-sided lateral vision field deficit.  CT negative for acute findings.  MRI showed moderate side right PCA acute CVA and MRA showed occlusion of the right P2 PCA.  Review of Systems: As per HPI otherwise 14 point review of systems negative.    Past Medical History:  Diagnosis Date  . AAA (abdominal aortic aneurysm) ([place])   . Atherosclerotic renal artery stenosis, bilateral (HCC)    Bilateral renal angiography 11/27/11, Right 70% prox, Left 90% prox s/p LRA PTA/Stenting - 6.0x27m ;  12/12/2011 Renal Duplex: Patent LRA stent & > 60% RRA ost stenosis  . Atrial fibrillation ([place] 11/19/2019  . Blood transfusion    S/P "lost blood due to hemorrhoids"  . Complication of anesthesia 2013   Severe Diarrhea aftter renal artery  . Dyspnea    with effusion  . Dysrhythmia    "fast one time"   . Hemorrhoids   . High cholesterol   . HTN (hypertension)   . Kidney disease, chronic, stage III (moderate, EGFR 30-59 ml/min) (HCC)    [facility]  . Pericardial effusion    s/p pericardiocentesis 11/2019 // reaccumulated >> s/p window 12/2019 // Echo 9/21: EF 60-65, no RWMA, moderate LVH, normal RVSF, severe BAE, small effusion posterior to the left ventricle (no evidence of tamponade), mild-moderate MR  . Pre-diabetes   . Sciatica     Past Surgical History:  Procedure Laterality Date  . ABDOMINAL ANGIOGRAM N/A 11/27/2011   Procedure: ABDOMINAL ANGIOGRAM;  Surgeon: MSherren Mocha MD;  [facility];  Service: Cardiovascular;  Laterality: N/A;  . BIOPSY  03/13/2018   Procedure: BIOPSY;  Surgeon: SLadene Artist MD;  Location: [facility] ENDOSCOPY;  Service: Endoscopy;;  . CATARACT EXTRACTION W/ INTRAOCULAR LENS  IMPLANT, BILATERAL  1990's  . ESOPHAGOGASTRODUODENOSCOPY N/A 03/13/2018   Procedure: ESOPHAGOGASTRODUODENOSCOPY (EGD);  Surgeon: SLadene Artist MD;  [facility];  Service: Endoscopy;  Laterality: N/A;  . HOT HEMOSTASIS N/A 03/13/2018   Procedure: HOT HEMOSTASIS (ARGON PLASMA COAGULATION/BICAP);  Surgeon: SLadene Artist MD;  [facility];  Service: Endoscopy;  Laterality: N/A;  . NASAL SEPTUM SURGERY  1980's  . PERICARDIOCENTESIS N/A 10/27/2019   Procedure: PERICARDIOCENTESIS;  Surgeon: CSherren Mocha MD;  [facility];  Service: Cardiovascular;  Laterality: N/A;  . RENAL ANGIOGRAM Bilateral 11/27/2011   Procedure: RENAL ANGIOGRAM;  Surgeon: MSherren Mocha MD;  [facility];  Service: Cardiovascular;  Laterality: Bilateral;  possible PTA/stent  . RENAL ARTERY STENT  11/27/11  . RIGHT HEART CATH N/A 10/27/2019   Procedure: RIGHT HEART CATH;  Surgeon: Sherren Mocha, MD;  Location: [place] CV LAB;  Service: Cardiovascular;  Laterality: N/A;  . SUBXYPHOID PERICARDIAL WINDOW N/A 12/09/2019   Procedure: SUBXYPHOID  PERICARDIAL WINDOW;  Surgeon: Ivin Poot, MD;  Location: [place];  Service: Thoracic;  Laterality: N/A;  . TEE WITHOUT CARDIOVERSION N/A 12/09/2019   Procedure: TRANSESOPHAGEAL ECHOCARDIOGRAM (TEE);  Surgeon: Prescott Gum, Collier Salina, MD;  Location: [place];  Service: Thoracic;  Laterality: N/A;     reports that he has been smoking pipe and cigarettes. He has smoked for the past 65.00 years. He has never used smokeless tobacco. He reports current alcohol use of about 10.0 standard drinks of alcohol per week. He reports that he does not use drugs.  No Known Allergies  Family History  Problem Relation Age of Onset  . Hypertension Mother   . Hypertension Brother      Prior to Admission medications   Medication Sig Start Date End Date Taking? Authorizing Provider  carvedilol (COREG) 12.5 MG tablet Take 12.5 mg by mouth 2 (two) times daily. 02/28/20  Yes [provider]  cholecalciferol (VITAMIN D) 25 MCG (1000 UNIT) tablet Take 1 tablet (1,000 Units total) by mouth daily. 12/12/19  Yes Lars Pinks M, PA-C  furosemide (LASIX) 40 MG tablet Take 40 mg by mouth daily. 06/16/20  Yes [provider]  lisinopril (ZESTRIL) 10 MG tablet Take 10 mg by mouth daily. 06/16/20  Yes [provider]  rosuvastatin (CRESTOR) 40 MG tablet Take 40 mg by mouth daily.   Yes [provider]  tamsulosin (FLOMAX) 0.4 MG CAPS capsule Take 0.4 mg by mouth daily.   Yes [provider]    Physical Exam: Vitals:   08/02/20 1330 08/02/20 1345 08/02/20 1400 08/02/20 1415  BP: (!) 167/73 (!) 171/78 (!) 166/77 (!) 147/73  Pulse: 61 65 61 60  Resp:  16 (!) 24 (!) 23  Temp:      TempSrc:      SpO2: 95% 95% 96% 95%  Weight:      Height:        Constitutional: NAD, calm, comfortable Vitals:   08/02/20 1330 08/02/20 1345 08/02/20 1400 08/02/20 1415  BP: (!) 167/73 (!) 171/78 (!) 166/77 (!) 147/73  Pulse: 61 65 61 60  Resp:  16 (!) 24 (!) 23  Temp:      TempSrc:       SpO2: 95% 95% 96% 95%  Weight:      Height:       Eyes: PERRL, lids and conjunctivae normal ENMT: Mucous membranes are moist. Posterior pharynx clear of any exudate or lesions.Normal dentition.  Neck: normal, supple, no masses, no thyromegaly Respiratory: clear to auscultation bilaterally, no wheezing, no crackles. Normal respiratory effort. No accessory muscle use.  Cardiovascular: Regular rate and rhythm, no murmurs / rubs / gallops. No extremity edema. 2+ pedal pulses. No carotid bruits.  Abdomen: no tenderness, no masses palpated. No hepatosplenomegaly. Bowel sounds positive.  Musculoskeletal: no clubbing / cyanosis. No joint deformity upper and lower extremities. Good ROM, no contractures. Normal muscle tone.  Skin: no rashes, lesions, ulcers. No induration Neurologic: CN 2-12 grossly intact. Sensation intact, DTR normal. Strength 5/5 in all 4.  Psychiatric: Normal judgment and insight. Alert and oriented x 3. Normal mood.     Labs on Admission: I have  personally reviewed following labs and imaging studies  CBC: Recent Labs  Lab 08/02/20 1241  WBC 5.2  NEUTROABS 3.0  HGB 10.9*  HCT 35.1*  MCV 86.7  PLT 003*   Basic Metabolic Panel: Recent Labs  Lab 08/02/20 1241  NA 143  K 3.7  CL 106  CO2 27  GLUCOSE 159*  BUN 38*  CREATININE 1.80*  CALCIUM 9.0   GFR: Estimated Creatinine Clearance: 24.6 mL/min (A) (by C-G formula based on SCr of 1.8 mg/dL (H)). Liver Function Tests: Recent Labs  Lab 08/02/20 1241  AST 28  ALT 18  ALKPHOS 47  BILITOT 0.9  PROT 6.2*  ALBUMIN 3.0*   No results for input(s): LIPASE, AMYLASE in the last 168 hours. No results for input(s): AMMONIA in the last 168 hours. Coagulation Profile: Recent Labs  Lab 08/02/20 1241  INR 1.2   Cardiac Enzymes: No results for input(s): CKTOTAL, CKMB, CKMBINDEX, TROPONINI in the last 168 hours. BNP (last 3 results) Recent Labs    10/04/19 1135  PROBNP 1,343*   HbA1C: No results for  input(s): HGBA1C in the last 72 hours. CBG: No results for input(s): GLUCAP in the last 168 hours. Lipid Profile: No results for input(s): CHOL, HDL, LDLCALC, TRIG, CHOLHDL, LDLDIRECT in the last 72 hours. Thyroid Function Tests: No results for input(s): TSH, T4TOTAL, FREET4, T3FREE, THYROIDAB in the last 72 hours. Anemia Panel: No results for input(s): VITAMINB12, FOLATE, FERRITIN, TIBC, IRON, RETICCTPCT in the last 72 hours. Urine analysis:    Component Value Date/Time   COLORURINE STRAW (A) 08/02/2020 1241   APPEARANCEUR CLEAR 08/02/2020 1241   LABSPEC 1.008 08/02/2020 1241   PHURINE 7.0 08/02/2020 1241   GLUCOSEU NEGATIVE 08/02/2020 1241   HGBUR NEGATIVE 08/02/2020 1241   BILIRUBINUR NEGATIVE 08/02/2020 1241   KETONESUR NEGATIVE 08/02/2020 1241   PROTEINUR NEGATIVE 08/02/2020 1241   UROBILINOGEN 0.2 12/19/2014 1249   NITRITE NEGATIVE 08/02/2020 1241   LEUKOCYTESUR NEGATIVE 08/02/2020 1241    Radiological Exams on Admission: CT HEAD WO CONTRAST  Result Date: 08/02/2020 CLINICAL DATA:  Global amnesia with altered mental status EXAM: CT HEAD WITHOUT CONTRAST TECHNIQUE: Contiguous axial images were obtained from the base of the skull through the vertex without intravenous contrast. COMPARISON:  Brain MRI June 27, 2019 FINDINGS: Brain: There is age related volume loss. There is no intracranial mass, hemorrhage, extra-axial fluid collection, or midline shift. There is mild small vessel disease in the centra semiovale bilaterally. No acute infarct appreciable. Vascular: No hyperdense vessel. Foci of calcification in the carotid siphon regions noted. Skull: The bony calvarium appears intact. Sinuses/Orbits: There is mucosal thickening in the inferior left maxillary antrum anteriorly as well as mucosal thickening in several ethmoid air cells. Orbits appear symmetric bilaterally. Other: Mastoid air cells are clear. IMPRESSION: Age related volume loss with mild stable periventricular small  vessel disease. No acute infarct. No mass or hemorrhage. There are foci of arterial vascular calcification. There are foci of paranasal sinus disease. Electronically Signed   By: Lowella Grip III M.D.   On: 08/02/2020 13:07    EKG: Independently reviewed.  Sinus rhythm, no PR or QTc interval changes  Assessment/Plan Active Problems:   CVA (cerebral vascular accident) ([place])  (please populate well all problems here in Problem List. (For example, if patient is on BP meds at home and you resume or decide to hold them, it is a problem that needs to be her. Same for CAD, COPD, HLD and so on)  Acute right-sided PCA  infarct -Likely embolic from chronic A. Fib. -Daughter at bedside reported patient was found to have paroxysmal A. fib about 1 years ago, and after series of discussion with cardiology and GI regarding risk versus benefit of anticoagulation and aspirin, it was decided patient is a too high risk for systemic anticoagulation given his history of GI bleed and unsteady gait.  And GI also discontinued patient's aspirin for patient's history of severe life-threatening GI bleed issue. -A1c and lipid panel  PAF -In sinus, as needed metoprolol for breakthrough tachycardia -Patient daughter reported that family has discussed with cardiology regarding alternative to systemic anticoagulation, such as Watchman procedure, but for pt's Hx of AAA and pericardial procedure in the past, it seems that Watchman is a high risk venture thus not pursued further.  HTN -Allow permissive HTN as mentioned above.  DVT prophylaxis: Heparin subcu Code Status: Full code Family Communication: Daughter at bedside Disposition Plan: Expect more than 2 midnight hospital stay for stroke work-up and PT evaluation Consults called: Neurology Admission status: Telemetry admission   Lequita Halt MD [facility] Pager [phone]  08/02/2020, 3:32 PM

## 2020-08-02 NOTE — Consult Note (Signed)
Neurology Consultation  CC: Patient woke up with bilateral partial vision loss, lightheadedness, and confusion. Slurred speech per EMS.   History is obtained from: patient, patient's daughter at bedside during assessment  HPI: ANGUEL DELAPENA is a 86 y.o. male with a medical history significant for paroxysmal Afib not on AC due to history of GI bleed (5-6 years ago controlled vessel hemorrhage via cauterization), CKD stage IIIB, AAA, HLD, DM2 (6.3% A1c 12/07/19), chronic anemia, and bilateral atherosclerotic renal artery stenosis, and pericardial effusion (drained 750 cc in late April 2021). Mr. Luna presented to the ED today 12/15 with complaints of confusion, lightheadedness, and vision changes. His daughter reportedly helps her parents in the morning time and today when she arrived, Mr. Eckley was not out of bed which is abnormal for him. She went to check on him and he had woken up and felt generalized weakness, confusion, and disorientation prompting his daughter to call EMS. On EMS arrival, Mr. Binstock refused transport to the ED. His other daughter came to his home and stated he looked confused and stated he was dizzy and couldn't see well. She called his PCP and was advised to have EMS transport him immediately to the ED.   LKW: 12/14 21:00 tpa given?: No, outside of time window.  IR Thrombectomy? No, no LVO noted on radiology images.  Modified Rankin Scale: 3-Moderate disability-requires help but walks WITHOUT assistance NIHSS:  1a Level of Conscious.: 0 1b LOC Questions: 1 (per daughter, this is patient's baseline mental status) 1c LOC Commands: 0 2 Best Gaze: 0 3 Visual: 1 4 Facial Palsy: 0 5a Motor Arm - left: 0 5b Motor Arm - Right: 0 6a Motor Leg - Left: 0 6b Motor Leg - Right: 0 7 Limb Ataxia: 0 8 Sensory: 0 9 Best Language: 0 10 Dysarthria: 0 11 Extinct. and Inatten.: 0 TOTAL: 2  ROS: limited by patient's baseline memory issues  Past Medical History:  Diagnosis Date   . AAA (abdominal aortic aneurysm) ([place])   . Atherosclerotic renal artery stenosis, bilateral (HCC)    Bilateral renal angiography 11/27/11, Right 70% prox, Left 90% prox s/p LRA PTA/Stenting - 6.0x41mm ;  12/12/2011 Renal Duplex: Patent LRA stent & > 60% RRA ost stenosis  . Atrial fibrillation ([place]) 11/19/2019  . Blood transfusion    S/P "lost blood due to hemorrhoids"  . Complication of anesthesia 2013   Severe Diarrhea aftter renal artery  . Dyspnea    with effusion  . Dysrhythmia    "fast one time"  . Hemorrhoids   . High cholesterol   . HTN (hypertension)   . Kidney disease, chronic, stage III (moderate, EGFR 30-59 ml/min) (HCC)    sees [facility]  . Pericardial effusion    s/p pericardiocentesis 11/2019 // reaccumulated >> s/p window 12/2019 // Echo 9/21: EF 60-65, no RWMA, moderate LVH, normal RVSF, severe BAE, small effusion posterior to the left ventricle (no evidence of tamponade), mild-moderate MR  . Pre-diabetes   . Sciatica    Family History  Problem Relation Age of Onset  . Hypertension Mother   . Hypertension Brother    Social History:  reports that he has been smoking pipe and cigarettes. He has smoked for the past 65.00 years. He has never used smokeless tobacco. He reports current alcohol use of about 10.0 standard drinks of alcohol per week. He reports that he does not use drugs.  Current Outpatient Medications  Medication Instructions  . carvedilol (COREG) 12.5 mg, Oral, 2  times daily  . cholecalciferol (VITAMIN D) 1,000 Units, Oral, Daily  . furosemide (LASIX) 40 mg, Oral, Daily  . lisinopril (ZESTRIL) 10 mg, Oral, Daily  . rosuvastatin (CRESTOR) 40 mg, Oral, Daily  . tamsulosin (FLOMAX) 0.4 mg, Oral, Daily   Exam: Current vital signs: BP (!) 147/73   Pulse 60   Temp 97.7 F (36.5 C) (Oral)   Resp (!) 23   Ht $R'5\' 9"'gg$  (1.753 m)   Wt 61.2 kg   SpO2 95%   BMI 19.94 kg/m   Physical Exam  Constitutional: Appears well-developed and well-nourished.  Confused to time at baseline.  Psych: Affect appropriate to situation. Calm and cooperative with exam.  Eyes: No scleral injection. Wears eyeglasses.  HENT: No OP obstruction. Hard of hearing.  Head: Normocephalic, atraumatic.  Cardiovascular: Normal rate. Warm extremities without edema.  Respiratory: Effort normal, non-labored breathing.  GI: Soft.  No distension. There is no tenderness.  Skin: WDI  Neuro: Mental Status: Patient is awake, alert, oriented to person, place, and situation. Not oriented to year or month.  Patient is unable to give a concise clear and coherent history. He is able to recall most events of today's history but per his daughter, he leaves out and denies pertinent information.  No signs of aphasia or neglect. Cranial Nerves: II: Left sided partial hemianopia. Pupils are equal, round, and reactive to light 3 --> 2, brisk.  III,IV, VI: EOMI without ptosis or diploplia.  V: Facial sensation is symmetric to light touch VII: Facial movement is symmetric.  VIII: Hearing is intact to voice (hard of hearing at baseline) X: Uvula elevates symmetrically XI: Shoulder shrug is symmetric. XII: Tongue is midline without atrophy or fasciculations.  Motor: Tone is normal. Bulk is normal. 5/5 strength was present in all four extremities.  Sensory: Sensation is symmetric to light touch in upper and lower extremities bilaterally. Deep Tendon Reflexes: 2+ and symmetric in the biceps and 3+ and symmetric in the patellae.  Plantars: Toes are downgoing bilaterally. Cerebellar: FNF and HKS are intact bilaterally  I have reviewed labs in epic and the pertinent results are: Creatinine 1.8, BUN 38, GFR 36  I have reviewed the images obtained: MR Brain without contrast, MRA head and neck without contrast 12/15:  IMPRESSION: - Moderate size right PCA territory acute infarction involving temporo-occipital lobes. Occlusion of the right P2 PCA. - Small acute cortical infarcts of  the left frontal lobe and left parieto-occipital junction. - Atherosclerotic irregularity of distal left MCA branches. 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    Secondary Diagnosis: Paroxysmal atrial fibrillation, Chronic atrial fibrillation and CKD Stage 3 (GFR 30-59)  Impression: Mr. Culley is a 86 year old male with a history significant for  paroxysmal Afib on rate control but not on AC due to history of GI bleed (5-6 years ago), CKD stage IIIB, AAA, HLD, chronic anemia, and bilateral atherosclerotic renal artery stenosis. He presented to the ED 12/15 with complaints of waking up with weakness, confusion, and visual changes. LKW 12/14 @ 21:00. Imaging revealed an acute moderate right PCA acute infarction in the temporo-occipital lobes and occlusion of the right P2 PCA, small acute cortical infarcts of the left frontal lobe and left parieto-occipital junction, as well as some atherosclerotic changes. Pending further work up to determine etiology of ischemic injury; favor cardioembolic over atheroembolic given his known atrial fibrillation not on anticoagulation.  Recommendations: -  Frequent neuro checks - Echocardiogram to exclude intracardiac thrombus - Family has requested no antiplatelet medication at this time, plan to discuss 325 mg daily starting 12/16 again with them tomorrow - Risk factor modification  - Telemetry monitoring - PT consult, OT consult, Speech consult - Consider GI consult in the morning to further discuss risks/benefits of anticoagulation at this time versus deferring to conversations that have already happened on the outpatient basis - Stroke team to follow  Assessment and plan discussed with attending provider and they are in agreement. Anibal Henderson, [place] [facility] [phone]

## 2020-08-02 NOTE — ED Notes (Signed)
Pt transported to MRI at this time 

## 2020-08-02 NOTE — Progress Notes (Signed)
Neuro attending recommends ASA 325.  Will start ASA and start GI prophylaxis with protonix.

## 2020-08-02 NOTE — ED Triage Notes (Signed)
Pt arrived to ED via [facility] w/ c/o AMS. EMS reports they were called out to the residence twice, both times for stroke-like s/s. Upon arrival to the initial call, pt's daughter reported that pt got up and around at 0750, which is later than his usual, and that he was slow-moving. Pt reported dizziness, lightheadedness, blurry vision, and confusion was reported by daughter, but not elaborated on. On the 2nd call to the residence EMS reports that pt denied previous mentioned s/s and was A&Ox3 and disoriented x 1 (time) which was reported as baseline for pt. Pt has hx of A-fib. Cardiologist was called at some point during encounter and wanted the pt sent to the ED to rule out CVA. EMS reported pt HR 74 A-fib at controlled rate and that pt takes his blood thinner.  EMS VS: BP 160/62, HR 74 A-fib, O2 97% RA, CBG 214, 97.2 temporal. EMS IV 20g R FA

## 2020-08-02 NOTE — Telephone Encounter (Signed)
    Chris Peterson calling, she said this morning Chris Peterson is in Afib. He couldn't get up the bed they called EMS and they said he might had a stroke but didn't bring him to the hospital. [name] is requesting to schedule an appt with Dr. Burt Knack today to check Chris Peterson.

## 2020-08-02 NOTE — Telephone Encounter (Signed)
Spoke with pt's daughter who is not on DPR, advised she will need to be added the next time pt is in the office.  Pt's daughter states pt was still in the bed this am when arrived.  Daughter reports pt was unable to get out of bed this am and could not see.  EMS was called and came out to evaluate pt.  EMS advised pt was in AFIB and could  Have possibly had a stroke but seemed stable and deferred transport to the hospital.  Pt's daughter reports pt's sight has returned but is now having difficulty speaking.  Advised daughter to call EMS now and request emergency transport to hospital for further evaluation.  Pt's daughter verbal;izes understanding and states she will call 911 now.

## 2020-08-02 NOTE — ED Notes (Signed)
Nanavati MD at bedside 

## 2020-08-02 NOTE — ED Notes (Signed)
Blue, light green, lavender and gold top obtained and sent to lab. Dark green on rocker in mini lab

## 2020-08-02 NOTE — ED Notes (Signed)
Patient resting in bed, no issues noted at this time.   VSS

## 2020-08-03 ENCOUNTER — Inpatient Hospital Stay (HOSPITAL_COMMUNITY): Payer: MEDICARE

## 2020-08-03 DIAGNOSIS — I6389 Other cerebral infarction: Secondary | ICD-10-CM

## 2020-08-03 DIAGNOSIS — I340 Nonrheumatic mitral (valve) insufficiency: Secondary | ICD-10-CM

## 2020-08-03 DIAGNOSIS — I351 Nonrheumatic aortic (valve) insufficiency: Secondary | ICD-10-CM

## 2020-08-03 DIAGNOSIS — I361 Nonrheumatic tricuspid (valve) insufficiency: Secondary | ICD-10-CM

## 2020-08-03 LAB — CBC
HCT: 37.7 % — ABNORMAL LOW (ref 39.0–52.0)
Hemoglobin: 11.8 g/dL — ABNORMAL LOW (ref 13.0–17.0)
MCH: 27.7 pg (ref 26.0–34.0)
MCHC: 31.3 g/dL (ref 30.0–36.0)
MCV: 88.5 fL (ref 80.0–100.0)
Platelets: 144 10*3/uL — ABNORMAL LOW (ref 150–400)
RBC: 4.26 MIL/uL (ref 4.22–5.81)
RDW: 16.7 % — ABNORMAL HIGH (ref 11.5–15.5)
WBC: 6.1 10*3/uL (ref 4.0–10.5)
nRBC: 0 % (ref 0.0–0.2)

## 2020-08-03 LAB — COMPREHENSIVE METABOLIC PANEL
ALT: 12 U/L (ref 0–44)
AST: 28 U/L (ref 15–41)
Albumin: 3.1 g/dL — ABNORMAL LOW (ref 3.5–5.0)
Alkaline Phosphatase: 45 U/L (ref 38–126)
Anion gap: 19 — ABNORMAL HIGH (ref 5–15)
BUN: 34 mg/dL — ABNORMAL HIGH (ref 8–23)
CO2: 19 mmol/L — ABNORMAL LOW (ref 22–32)
Calcium: 8.9 mg/dL (ref 8.9–10.3)
Chloride: 107 mmol/L (ref 98–111)
Creatinine, Ser: 1.74 mg/dL — ABNORMAL HIGH (ref 0.61–1.24)
GFR, Estimated: 37 mL/min — ABNORMAL LOW (ref >60)
Glucose, Bld: 103 mg/dL — ABNORMAL HIGH (ref 70–99)
Potassium: 3.7 mmol/L (ref 3.5–5.1)
Sodium: 145 mmol/L (ref 135–145)
Total Bilirubin: 1.3 mg/dL — ABNORMAL HIGH (ref 0.3–1.2)
Total Protein: 6 g/dL — ABNORMAL LOW (ref 6.5–8.1)

## 2020-08-03 LAB — ECHOCARDIOGRAM COMPLETE
AR max vel: 2.11 cm2
AV Area VTI: 1.98 cm2
AV Area mean vel: 2.24 cm2
AV Mean grad: 7.5 mmHg
AV Peak grad: 15.6 mmHg
Ao pk vel: 1.98 m/s
Area-P 1/2: 3.99 cm2
Height: 69 in
P 1/2 time: 715 msec
S' Lateral: 2.85 cm
Weight: 2160 oz

## 2020-08-03 LAB — LIPID PANEL
Cholesterol: 113 mg/dL (ref 0–200)
HDL: 49 mg/dL (ref >40)
LDL Cholesterol: 43 mg/dL (ref 0–99)
Total CHOL/HDL Ratio: 2.3 RATIO
Triglycerides: 106 mg/dL (ref <150)
VLDL: 21 mg/dL (ref 0–40)

## 2020-08-03 LAB — RPR: RPR Ser Ql: NONREACTIVE

## 2020-08-03 LAB — HEMOGLOBIN A1C
Hgb A1c MFr Bld: 6.6 % — ABNORMAL HIGH (ref 4.8–5.6)
Mean Plasma Glucose: 142.72 mg/dL

## 2020-08-03 LAB — TSH: TSH: 1.752 u[IU]/mL (ref 0.350–4.500)

## 2020-08-03 LAB — CK: Total CK: 115 U/L (ref 49–397)

## 2020-08-03 MED ORDER — SODIUM CHLORIDE 0.9 % IV SOLN: INTRAVENOUS | Status: DC

## 2020-08-03 NOTE — Progress Notes (Signed)
[facility] Progress Note  Patient: Chris Peterson    QIH:[id number]  DOA: 08/02/2020     Date of Service: the patient was seen and examined on 08/03/2020  Brief hospital course: Past medical history of A. fib not on anticoagulation secondary to GI bleed, CKD 3B, AAA, HLD, chronic anemia.  Presents with acute CVA. Currently plan is for the stroke work-up.  Assessment and Plan: 1.  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 fib CT scan of the head shows hypoattenuation of the right temporoparietal region MRI brain confirms acute CVA MRI shows P2 occlusion. 2D echocardiogram EF 75%. LDL 43. Hemoglobin A1c 6.6. PT OT recommendation pending for now. Patient is not on any antithrombotic therapy prior to admission. Currently on aspirin 300 mg daily. Neurology recommending anticoagulation starting on 08/07/2020. We will get GI clearance given prior GI bleeding history  2.  Paroxysmal A. fib Rate control for now. Neurology recommending anticoagulation. We will get GI clearance.  3.  Hyperlipidemia LDL 43. Crestor will continue.  4.  CKD 3B Renal function stable.  Monitor.  5.  Prior history of GI bleed. EGD 2019 shows nonbleeding gastric ulcer with visible blood vessel. Treated with cautery. No further bleeding since then. Patient is not on anticoagulation since then.  We will get GI consultation. Continue PPI.  6.  Active smoker Nicotine patch outpatient. Patient willing to quit.  Diet: D3 diet DVT Prophylaxis:   Place and maintain sequential compression device Start: 08/03/20 0934    Advance goals of care discussion: Full code  Family Communication: family was present at bedside, at the time of interview.  The pt provided permission to discuss medical plan with the family. Opportunity was given to ask question and all questions were answered satisfactorily.   Disposition:  Status is: Inpatient  Remains inpatient  appropriate because:Ongoing diagnostic testing needed not appropriate for outpatient work up   Dispo: The patient is from: Home              Anticipated d/c is to: to be determined              Anticipated d/c date is: 1 day              Patient currently is not medically stable to d/c.        Subjective: No nausea no vomiting.  No fever no chills.  No chest pain.  Abdominal pain.  No diarrhea.  Physical Exam:  General: Appear in mild distress, no Rash; Oral Mucosa Clear, moist. no Abnormal Neck Mass Or lumps, Conjunctiva normal  Cardiovascular: S1 and S2 Present, no Murmur, Respiratory: good respiratory effort, Bilateral Air entry present and CTA, no Crackles, no wheezes Abdomen: Bowel Sound present, Soft and no tenderness Extremities: no Pedal edema Neurology: alert and oriented to time, place, and person affect appropriate. no new focal deficit Gait not checked due to patient safety concerns  Vitals:   08/03/20 1300 08/03/20 1500 08/03/20 1700 08/03/20 1800  BP: (!) 142/83 (!) 147/91 (!) 158/95 (!) 155/92  Pulse: (!) 110 93 96 (!) 33  Resp: 18 20 16 18   Temp: 98.1 F (36.7 C)     TempSrc: Oral     SpO2: 96% 95% 96% 96%  Weight:      Height:       No intake or output data in the 24 hours ending 08/03/20 1946 Filed Weights   08/02/20 1138  Weight: 61.2 kg  Data Reviewed: I have personally reviewed and interpreted daily labs, tele strips, imagings as discussed above. I reviewed all nursing notes, pharmacy notes, vitals, pertinent old records I have discussed plan of care as described above with RN and patient/family.  CBC: Recent Labs  Lab 08/02/20 1241 08/03/20 0907  WBC 5.2 6.1  NEUTROABS 3.0  --   HGB 10.9* 11.8*  HCT 35.1* 37.7*  MCV 86.7 88.5  PLT 134* 283*   Basic Metabolic Panel: Recent Labs  Lab 08/02/20 1241 08/03/20 0907  NA 143 145  K 3.7 3.7  CL 106 107  CO2 27 19*  GLUCOSE 159* 103*  BUN 38* 34*  CREATININE 1.80* 1.74*  CALCIUM  9.0 8.9    Studies: ECHOCARDIOGRAM COMPLETE  Result Date: 08/03/2020    ECHOCARDIOGRAM REPORT   Patient Name:   Chris Peterson Date of Exam: 08/03/2020 Medical Rec #:  [id number]        Height:       69.0 in Accession #:    [id number]       Weight:       135.0 lb Date of Birth:  [birth year]        BSA:          1.748 m Patient Age:    86 years         BP:           169/96 mmHg Patient Gender: M                HR:           94 bpm. Exam Location:  Inpatient Procedure: 2D Echo, Cardiac Doppler and Color Doppler Indications:    TIA 435.9 / G45.9  History:        Patient has prior history of Echocardiogram examinations, most                 recent 05/02/2020. Arrythmias:Atrial Fibrillation,                 Signs/Symptoms:Dyspnea; Risk Factors:Hypertension and                 Dyslipidemia. Abdominal Aortic Aneurysm. CKD. Pericardial                 effusion.  Sonographer:    Jonelle Sidle Dance Referring Phys: [id number] [place]  1. Left ventricular ejection fraction, by estimation, is >75%. The left ventricle has hyperdynamic function. The left ventricle has no regional wall motion abnormalities. There is mild left ventricular hypertrophy. Left ventricular diastolic function could not be evaluated.  2. Right ventricular systolic function is normal. The right ventricular size is normal. Tricuspid regurgitation signal is inadequate for assessing PA pressure.  3. Left atrial size was severely dilated.  4. Right atrial size was mildly dilated.  5. The mitral valve is normal in structure. Mild mitral valve regurgitation. No evidence of mitral stenosis.  6. The aortic valve is calcified. Aortic valve regurgitation is mild. Mild to moderate aortic valve sclerosis/calcification is present, without any evidence of aortic stenosis.  7. The inferior vena cava is normal in size with greater than 50% respiratory variability, suggesting right atrial pressure of 3 mmHg. FINDINGS  Left Ventricle: Left ventricular ejection  fraction, by estimation, is >75%. The left ventricle has hyperdynamic function. The left ventricle has no regional wall motion abnormalities. The left ventricular internal cavity size was normal in size. There is mild left ventricular hypertrophy. Left ventricular diastolic function could not  be evaluated due to atrial fibrillation. Left ventricular diastolic function could not be evaluated. Right Ventricle: The right ventricular size is normal.Right ventricular systolic function is normal. Tricuspid regurgitation signal is inadequate for assessing PA pressure. The tricuspid regurgitant velocity is 2.38 m/s, and with an assumed right atrial pressure of 3 mmHg, the estimated right ventricular systolic pressure is 98.1 mmHg. Left Atrium: Left atrial size was severely dilated. Right Atrium: Right atrial size was mildly dilated. Pericardium: Trivial pericardial effusion is present. Mitral Valve: The mitral valve is normal in structure. Mild mitral annular calcification. Mild mitral valve regurgitation. No evidence of mitral valve stenosis. Tricuspid Valve: The tricuspid valve is normal in structure. Tricuspid valve regurgitation is mild . No evidence of tricuspid stenosis. Aortic Valve: The aortic valve is calcified. Aortic valve regurgitation is mild. Aortic regurgitation PHT measures 715 msec. Mild to moderate aortic valve sclerosis/calcification is present, without any evidence of aortic stenosis. Aortic valve mean gradient measures 7.5 mmHg. Aortic valve peak gradient measures 15.6 mmHg. Aortic valve area, by VTI measures 1.98 cm. Pulmonic Valve: The pulmonic valve was not well visualized. Pulmonic valve regurgitation is not visualized. No evidence of pulmonic stenosis. Aorta: The aortic root is normal in size and structure. Venous: The inferior vena cava is normal in size with greater than 50% respiratory variability, suggesting right atrial pressure of 3 mmHg.  LEFT VENTRICLE PLAX 2D LVIDd:         3.45 cm LVIDs:          2.85 cm LV PW:         1.75 cm LV IVS:        1.20 cm LVOT diam:     2.10 cm LV SV:         70 LV SV Index:   40 LVOT Area:     3.46 cm  RIGHT VENTRICLE          IVC RV Basal diam:  3.47 cm  IVC diam: 1.60 cm RV Mid diam:    2.53 cm TAPSE (M-mode): 1.5 cm LEFT ATRIUM              Index       RIGHT ATRIUM           Index LA diam:        4.80 cm  2.75 cm/m  RA Area:     23.00 cm LA Vol (A2C):   105.0 ml 60.06 ml/m RA Volume:   62.70 ml  35.87 ml/m LA Vol (A4C):   75.4 ml  43.13 ml/m LA Biplane Vol: 92.8 ml  53.08 ml/m  AORTIC VALVE AV Area (Vmax):    2.11 cm AV Area (Vmean):   2.24 cm AV Area (VTI):     1.98 cm AV Vmax:           197.50 cm/s AV Vmean:          125.000 cm/s AV VTI:            0.355 m AV Peak Grad:      15.6 mmHg AV Mean Grad:      7.5 mmHg LVOT Vmax:         120.50 cm/s LVOT Vmean:        80.750 cm/s LVOT VTI:          0.203 m LVOT/AV VTI ratio: 0.57 AI PHT:            715 msec  AORTA Ao Root diam: 3.80 cm Ao Asc  diam:  3.60 cm MITRAL VALVE                TRICUSPID VALVE MV Area (PHT): 3.99 cm     TR Peak grad:   22.7 mmHg MV Decel Time: 190 msec     TR Vmax:        238.00 cm/s MV E velocity: 155.00 cm/s MV A velocity: 56.20 cm/s   SHUNTS MV E/A ratio:  2.76         Systemic VTI:  0.20 m                             Systemic Diam: 2.10 cm Kirk Ruths MD Electronically signed by Kirk Ruths MD Signature Date/Time: 08/03/2020/12:27:01 PM    Final     Scheduled Meds:  aspirin  325 mg Oral Daily   cholecalciferol  1,000 Units Oral Daily   pantoprazole  40 mg Oral Daily   rosuvastatin  40 mg Oral Daily   tamsulosin  0.4 mg Oral Daily   Continuous Infusions: PRN Meds: acetaminophen **OR** acetaminophen (TYLENOL) oral liquid 160 mg/5 mL **OR** acetaminophen, metoprolol tartrate, senna-docusate  Time spent: 35 minutes  Author: Berle Mull, MD [facility] 08/03/2020 7:46 PM  To reach On-call, see care teams to locate the attending and reach out via  [email or web address]. Between 7PM-7AM, please contact night-coverage If you still have difficulty reaching the attending provider, please page the [facility] (Director on Call) for [facility] on amion for assistance.

## 2020-08-03 NOTE — Progress Notes (Signed)
  Echocardiogram 2D Echocardiogram has been performed.  Aylen Rambert G Gilbert [place] 08/03/2020, 10:59 AM

## 2020-08-03 NOTE — ED Notes (Signed)
Lunch Tray Ordered @ 1032. 

## 2020-08-03 NOTE — Progress Notes (Addendum)
STROKE TEAM PROGRESS NOTE   INTERVAL HISTORY Daughter at bedside.  Patient lying in bed, eyes open, awake alert, orientated x3.  Stated that he came in for confusion and bilateral leg weakness.  MRI showed right PCA large infarct, along with left cerebellum, left MCA/PCA and left MCA/ACA small infarcts.  Etiology of stroke concerning for A. fib not on AC.  Patient was on Coumadin until 02/2018 for GI bleeding. EGD with Dr. Fuller Plan at that time showed a nonbleeding gastric ulcer with visible vessel which was treated with cautery.  Coumadin never restarted.  He also follow with Dr. Trula Slade in vascular surgery for AAA.  I discussed with Dr. Trula Slade, no concerns from his standpoint for anticoagulation.  Vitals:   08/03/20 0300 08/03/20 0400 08/03/20 0500 08/03/20 0530  BP: (!) 150/66 (!) 147/62 (!) 147/82 (!) 180/88  Pulse: 69 62 64 66  Resp: 16 16 15 19   Temp:    97.8 F (36.6 C)  TempSrc:    Oral  SpO2: 92% 92% 92% 94%  Weight:      Height:       CBC:  Recent Labs  Lab 08/02/20 1241  WBC 5.2  NEUTROABS 3.0  HGB 10.9*  HCT 35.1*  MCV 86.7  PLT 740*   Basic Metabolic Panel:  Recent Labs  Lab 08/02/20 1241  NA 143  K 3.7  CL 106  CO2 27  GLUCOSE 159*  BUN 38*  CREATININE 1.80*  CALCIUM 9.0   Lipid Panel:  Recent Labs  Lab 08/03/20 0415  CHOL 113  TRIG 106  HDL 49  CHOLHDL 2.3  VLDL 21  LDLCALC 43   HgbA1c:  Recent Labs  Lab 08/03/20 0415  HGBA1C 6.6*   Urine Drug Screen: No results for input(s): LABOPIA, COCAINSCRNUR, LABBENZ, AMPHETMU, THCU, LABBARB in the last 168 hours.  Alcohol Level No results for input(s): ETH in the last 168 hours.  IMAGING past 24 hours CT HEAD WO CONTRAST  Addendum Date: 08/02/2020   ADDENDUM REPORT: 08/02/2020 15:56 ADDENDUM: On further review, decreased attenuation is noted in the medial right temporal-parietal junction region concerning for acute infarct in this area. Electronically Signed   By: Lowella Grip III M.D.   On:  08/02/2020 15:56   Result Date: 08/02/2020 CLINICAL DATA:  Global amnesia with altered mental status EXAM: CT HEAD WITHOUT CONTRAST TECHNIQUE: Contiguous axial images were obtained from the base of the skull through the vertex without intravenous contrast. COMPARISON:  Brain MRI June 27, 2019 FINDINGS: Brain: There is age related volume loss. There is no intracranial mass, hemorrhage, extra-axial fluid collection, or midline shift. There is mild small vessel disease in the centra semiovale bilaterally. No acute infarct appreciable. Vascular: No hyperdense vessel. Foci of calcification in the carotid siphon regions noted. Skull: The bony calvarium appears intact. Sinuses/Orbits: There is mucosal thickening in the inferior left maxillary antrum anteriorly as well as mucosal thickening in several ethmoid air cells. Orbits appear symmetric bilaterally. Other: Mastoid air cells are clear. IMPRESSION: Age related volume loss with mild stable periventricular small vessel disease. No acute infarct. No mass or hemorrhage. There are foci of arterial vascular calcification. There are foci of paranasal sinus disease. Electronically Signed: By: Lowella Grip III M.D. On: 08/02/2020 13:07   MR ANGIO HEAD WO CONTRAST  Result Date: 08/02/2020 CLINICAL DATA:  Stroke EXAM: MRI HEAD WITHOUT CONTRAST MRA HEAD WITHOUT CONTRAST MRA NECK WITHOUT AND WITH CONTRAST TECHNIQUE: Multiplanar, multiecho pulse sequences of the brain and surrounding structures  were obtained without intravenous contrast. Angiographic images of the Circle of Willis were obtained using MRA technique without intravenous contrast. Angiographic images of the neck were obtained using MRA technique without and with intravenous contrast. Carotid stenosis measurements (when applicable) are obtained utilizing NASCET criteria, using the distal internal carotid diameter as the denominator. CONTRAST:  21mL GADAVIST GADOBUTROL 1 MMOL/ML IV SOLN COMPARISON:   Correlation made with same day CT FINDINGS: MRI HEAD Brain: There is restricted diffusion involving the medial right temporal lobe with extension into the occipital lobe. Additional small foci of cortical restricted diffusion contralaterally at the left parietooccipital junction, left frontal lobe, and left cerebellum. Small focus of susceptibility in the right cerebellum is most consistent with chronic microhemorrhage. Additional patchy T2 hyperintensity in the supratentorial white matter is nonspecific but probably reflects mild chronic microvascular ischemic changes. Prominence of the ventricles and sulci reflects generalized parenchymal volume loss. There is no intracranial mass or significant mass effect. There is no hydrocephalus or extra-axial fluid collection. Vascular: Susceptibility along the right perimesencephalic cistern likely reflects thrombosis within the right P2 PCA. Major vessel flow voids at the skull base are preserved. Skull and upper cervical spine: Normal marrow signal is preserved. Sinuses/Orbits: Mild mucosal thickening. No significant orbital abnormality. Other: Sella is unremarkable. Mild patchy mastoid fluid opacification. MRA HEAD Intracranial internal carotid arteries are patent. Middle and anterior cerebral arteries are patent. Atherosclerotic irregularity of distal left MCA branches. Included intracranial vertebral arteries are patent. Patent PICA origins. Basilar artery is patent. There is diminished flow within the right P1 PCA. Possible diminutive right posterior communicating artery. Occlusion of the right P2 PCA. Left posterior cerebral artery is patent. MRA NECK Common, internal, and external carotid arteries are patent. There is plaque at the ICA origins bilaterally causing minimal stenosis. Extracranial vertebral arteries are patent. Left vertebral artery is slightly dominant. No measurable stenosis or evidence of dissection. IMPRESSION: Moderate size right PCA territory  acute infarction involving temporo-occipital lobes. Occlusion of the right P2 PCA. Small acute cortical infarcts of the left frontal lobe and left parieto-occipital junction. Atherosclerotic irregularity of distal left MCA branches. Atherosclerotic plaque at the ICA origins with minimal stenosis. Electronically Signed   By: Macy Mis M.D.   On: 08/02/2020 15:53   MR ANGIO NECK W WO CONTRAST  Result Date: 08/02/2020 CLINICAL DATA:  Stroke EXAM: MRI HEAD WITHOUT CONTRAST MRA HEAD WITHOUT CONTRAST MRA NECK WITHOUT AND WITH CONTRAST TECHNIQUE: Multiplanar, multiecho pulse sequences of the brain and surrounding structures were obtained without intravenous contrast. Angiographic images of the Circle of Willis were obtained using MRA technique without intravenous contrast. Angiographic images of the neck were obtained using MRA technique without and with intravenous contrast. Carotid stenosis measurements (when applicable) are obtained utilizing NASCET criteria, using the distal internal carotid diameter as the denominator. CONTRAST:  63mL GADAVIST GADOBUTROL 1 MMOL/ML IV SOLN COMPARISON:  Correlation made with same day CT FINDINGS: MRI HEAD Brain: There is restricted diffusion involving the medial right temporal lobe with extension into the occipital lobe. Additional small foci of cortical restricted diffusion contralaterally at the left parietooccipital junction, left frontal lobe, and left cerebellum. Small focus of susceptibility in the right cerebellum is most consistent with chronic microhemorrhage. Additional patchy T2 hyperintensity in the supratentorial white matter is nonspecific but probably reflects mild chronic microvascular ischemic changes. Prominence of the ventricles and sulci reflects generalized parenchymal volume loss. There is no intracranial mass or significant mass effect. There is no hydrocephalus or extra-axial fluid collection. Vascular:  Susceptibility along the right perimesencephalic  cistern likely reflects thrombosis within the right P2 PCA. Major vessel flow voids at the skull base are preserved. Skull and upper cervical spine: Normal marrow signal is preserved. Sinuses/Orbits: Mild mucosal thickening. No significant orbital abnormality. Other: Sella is unremarkable. Mild patchy mastoid fluid opacification. MRA HEAD Intracranial internal carotid arteries are patent. Middle and anterior cerebral arteries are patent. Atherosclerotic irregularity of distal left MCA branches. Included intracranial vertebral arteries are patent. Patent PICA origins. Basilar artery is patent. There is diminished flow within the right P1 PCA. Possible diminutive right posterior communicating artery. Occlusion of the right P2 PCA. Left posterior cerebral artery is patent. MRA NECK Common, internal, and external carotid arteries are patent. There is plaque at the ICA origins bilaterally causing minimal stenosis. Extracranial vertebral arteries are patent. Left vertebral artery is slightly dominant. No measurable stenosis or evidence of dissection. IMPRESSION: Moderate size right PCA territory acute infarction involving temporo-occipital lobes. Occlusion of the right P2 PCA. Small acute cortical infarcts of the left frontal lobe and left parieto-occipital junction. Atherosclerotic irregularity of distal left MCA branches. Atherosclerotic plaque at the ICA origins with minimal stenosis. Electronically Signed   By: Macy Mis M.D.   On: 08/02/2020 15:53   MR BRAIN WO CONTRAST  Result Date: 08/02/2020 CLINICAL DATA:  Stroke EXAM: MRI HEAD WITHOUT CONTRAST MRA HEAD WITHOUT CONTRAST MRA NECK WITHOUT AND WITH CONTRAST TECHNIQUE: Multiplanar, multiecho pulse sequences of the brain and surrounding structures were obtained without intravenous contrast. Angiographic images of the Circle of Willis were obtained using MRA technique without intravenous contrast. Angiographic images of the neck were obtained using MRA  technique without and with intravenous contrast. Carotid stenosis measurements (when applicable) are obtained utilizing NASCET criteria, using the distal internal carotid diameter as the denominator. CONTRAST:  22mL GADAVIST GADOBUTROL 1 MMOL/ML IV SOLN COMPARISON:  Correlation made with same day CT FINDINGS: MRI HEAD Brain: There is restricted diffusion involving the medial right temporal lobe with extension into the occipital lobe. Additional small foci of cortical restricted diffusion contralaterally at the left parietooccipital junction, left frontal lobe, and left cerebellum. Small focus of susceptibility in the right cerebellum is most consistent with chronic microhemorrhage. Additional patchy T2 hyperintensity in the supratentorial white matter is nonspecific but probably reflects mild chronic microvascular ischemic changes. Prominence of the ventricles and sulci reflects generalized parenchymal volume loss. There is no intracranial mass or significant mass effect. There is no hydrocephalus or extra-axial fluid collection. Vascular: Susceptibility along the right perimesencephalic cistern likely reflects thrombosis within the right P2 PCA. Major vessel flow voids at the skull base are preserved. Skull and upper cervical spine: Normal marrow signal is preserved. Sinuses/Orbits: Mild mucosal thickening. No significant orbital abnormality. Other: Sella is unremarkable. Mild patchy mastoid fluid opacification. MRA HEAD Intracranial internal carotid arteries are patent. Middle and anterior cerebral arteries are patent. Atherosclerotic irregularity of distal left MCA branches. Included intracranial vertebral arteries are patent. Patent PICA origins. Basilar artery is patent. There is diminished flow within the right P1 PCA. Possible diminutive right posterior communicating artery. Occlusion of the right P2 PCA. Left posterior cerebral artery is patent. MRA NECK Common, internal, and external carotid arteries are  patent. There is plaque at the ICA origins bilaterally causing minimal stenosis. Extracranial vertebral arteries are patent. Left vertebral artery is slightly dominant. No measurable stenosis or evidence of dissection. IMPRESSION: Moderate size right PCA territory acute infarction involving temporo-occipital lobes. Occlusion of the right P2 PCA. Small acute cortical infarcts  of the left frontal lobe and left parieto-occipital junction. Atherosclerotic irregularity of distal left MCA branches. Atherosclerotic plaque at the ICA origins with minimal stenosis. Electronically Signed   By: Macy Mis M.D.   On: 08/02/2020 15:53    PHYSICAL EXAM  Temp:  [97.8 F (36.6 C)-98.4 F (36.9 C)] 98.1 F (36.7 C) (12/16 1300) Pulse Rate:  [33-110] 33 (12/16 1800) Resp:  [15-22] 18 (12/16 1800) BP: (140-180)/(62-96) 155/92 (12/16 1800) SpO2:  [91 %-96 %] 96 % (12/16 1800)  General - Well nourished, well developed, in no apparent distress.  Ophthalmologic - fundi not visualized due to noncooperation.  Cardiovascular - irregularly irregular heart rate and rhythm  Mental Status -  Level of arousal and orientation to time, place, and person were intact. Language including expression, naming, repetition, comprehension was assessed and found intact.  No apraxia. Attention span and concentration were normal. Recent and remote memory were 3/3 registration and 0/3 delayed recall. Fund of Knowledge was assessed and was intact.  Cranial Nerves II - XII - II - Visual field intact OU except intermittent difficulty with right upper quadrant. III, IV, VI - Extraocular movements intact. V - Facial sensation intact bilaterally. VII - Facial movement intact bilaterally. VIII - Hearing & vestibular intact bilaterally. X - Palate elevates symmetrically. XI - Chin turning & shoulder shrug intact bilaterally. XII - Tongue protrusion intact.  Motor Strength - The patient's strength was normal in all extremities  and pronator drift was absent.  Bulk was normal and fasciculations were absent.   Motor Tone - Muscle tone was assessed at the neck and appendages and was normal.  Reflexes - The patient's reflexes were symmetrical in all extremities and he had no pathological reflexes.  Sensory - Light touch, temperature/pinprick were assessed and were symmetrical.    Coordination - The patient had normal movements in the hands with no ataxia or dysmetria.  Tremor was absent.  Gait and Station - deferred.   ASSESSMENT/PLAN [name] is a 86 y.o. male with history of paroxysmal Afib not on AC due to history of GI bleed (5-6 years ago controlled vessel hemorrhage via cauterization), CKD stage IIIB, AAA, HLD, DM2 (6.3% A1c 12/07/19), chronic anemia, and bilateral atherosclerotic renal artery stenosis, and pericardial effusion (drained 750 cc in late April 2021) presenting with confusion, lightheadedness, and vision changes.   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 Small L cerebellum, L frontal lobe and L parieto-occipital jxn infarcts.  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 Distal L MCA branch irregularity. Plaque at ICA origins.   2D Echo EF > 75%.  RA and LA severely dilated.   LDL 43  HgbA1c 6.6  VTE prophylaxis - SCDs   No antithrombotic prior to admission, now on aspirin 325 mg daily.  Given his gastric ulcer has been cauterized, no concern for [facility] from Dr. Trula Slade standpoint in terms of AAA, will recommend anticoagulation in 5 days post stroke to avoid hemorrhagic conversion.  Once anticoagulant started, aspirin can be discontinued.  Anticoagulant regimen will be discussed with pharmacy.  Therapy recommendations:  pending   Disposition:  pending   Paroxysmal Atrial Fibrillation  Home anticoagulation:  none d/t hx GIB  . 02/2018  gastric ulcer cauterized . Regarding AAAs, Dr. Trula Slade has no concern for an evaluation . Currently on aspirin  325 . Rate controlled  will recommend anticoagulation in 5 days post stroke to avoid hemorrhagic conversion. Once anticoagulant started, aspirin can be discontinued.  Anticoagulant regimen will be discussed with pharmacy.   Hypertension  Stable . Permissive hypertension (OK if < 220/120) but gradually normalize in 3-5 days . Long-term BP goal normotensive  Hyperlipidemia  Home meds:  crestor 40, resumed in hospital  LDL 43, goal < 70  Continue Crestor 40  Continue statin at discharge  Pre-Diabetes   HgbA1c 6.6, goal < 7.0  CBGs  SSI  PCP follow-up as outpatient  Tobacco abuse  Current smoker  Smoking cessation counseling provided  Pt is willing to quit  Other Stroke Risk Factors  Advanced Age >/= 25   ETOH use, alcohol level, advised to drink no more than 2 drink(s) a day  AAA - followed with Dr. Trula Slade, stable  B/l RAS  Other Active Problems  CKD stage III creatinine 1.8  GIB - 02/2018 EGD showed a nonbleeding gastric ulcer with visible vessel which was treated with cautery.  Pericardial effusion drained in 11/2019, repeat TTE and CT showed near resolution of effusion  Hospital day # 1  I spent  35 minutes in total face-to-face time with the patient, more than 50% of which was spent in counseling and coordination of care, reviewing test results, images and medication, and discussing the diagnosis, treatment plan and potential prognosis. This patient's care requiresreview of multiple databases, neurological assessment, discussion with family, other specialists and medical decision making of high complexity. I had long discussion with daughter at bedside, updated pt current condition, treatment plan and potential prognosis, and answered all the questions.  She expressed understanding and appreciation.  I also discussed with Dr. Trula Slade over the  phone.   Rosalin Hawking, MD PhD Stroke Neurology 08/03/2020 6:53 PM    To contact Stroke Continuity provider, please refer to [email or web address] After hours, contact General Neurology

## 2020-08-03 NOTE — ED Notes (Signed)
Updated patient's daughter on plan of care via phone.

## 2020-08-03 NOTE — ED Notes (Signed)
Pt has new gown on and new bed sheets on. Warm blankets given to pt.

## 2020-08-04 DIAGNOSIS — I63431 Cerebral infarction due to embolism of right posterior cerebral artery: Secondary | ICD-10-CM

## 2020-08-04 DIAGNOSIS — I10 Essential (primary) hypertension: Secondary | ICD-10-CM

## 2020-08-04 DIAGNOSIS — F1010 Alcohol abuse, uncomplicated: Secondary | ICD-10-CM

## 2020-08-04 DIAGNOSIS — I714 Abdominal aortic aneurysm, without rupture, unspecified: Secondary | ICD-10-CM

## 2020-08-04 DIAGNOSIS — I480 Paroxysmal atrial fibrillation: Secondary | ICD-10-CM

## 2020-08-04 DIAGNOSIS — N1832 Chronic kidney disease, stage 3b: Secondary | ICD-10-CM

## 2020-08-04 LAB — BASIC METABOLIC PANEL
Anion gap: 12 (ref 5–15)
Anion gap: 13 (ref 5–15)
BUN: 30 mg/dL — ABNORMAL HIGH (ref 8–23)
BUN: 26 mg/dL — ABNORMAL HIGH (ref 8–23)
CO2: 23 mmol/L (ref 22–32)
CO2: 24 mmol/L (ref 22–32)
Calcium: 8.8 mg/dL — ABNORMAL LOW (ref 8.9–10.3)
Calcium: 9 mg/dL (ref 8.9–10.3)
Chloride: 107 mmol/L (ref 98–111)
Chloride: 108 mmol/L (ref 98–111)
Creatinine, Ser: 1.85 mg/dL — ABNORMAL HIGH (ref 0.61–1.24)
Creatinine, Ser: 1.73 mg/dL — ABNORMAL HIGH (ref 0.61–1.24)
GFR, Estimated: 35 mL/min — ABNORMAL LOW (ref >60)
GFR, Estimated: 38 mL/min — ABNORMAL LOW (ref >60)
Glucose, Bld: 136 mg/dL — ABNORMAL HIGH (ref 70–99)
Glucose, Bld: 102 mg/dL — ABNORMAL HIGH (ref 70–99)
Potassium: 3.2 mmol/L — ABNORMAL LOW (ref 3.5–5.1)
Potassium: 3.7 mmol/L (ref 3.5–5.1)
Sodium: 142 mmol/L (ref 135–145)
Sodium: 145 mmol/L (ref 135–145)

## 2020-08-04 LAB — MAGNESIUM
Magnesium: 1.9 mg/dL (ref 1.7–2.4)
Magnesium: 1.9 mg/dL (ref 1.7–2.4)

## 2020-08-04 MED ORDER — QUETIAPINE FUMARATE 25 MG PO TABS
12.5000 mg | ORAL_TABLET | Freq: Every day | ORAL | Status: DC
  Administered 2020-08-04: 20:00:00 12.5 mg via ORAL
  Filled 2020-08-04: qty 1

## 2020-08-04 MED ORDER — POTASSIUM CHLORIDE CRYS ER 20 MEQ PO TBCR
20.0000 meq | EXTENDED_RELEASE_TABLET | Freq: Once | ORAL | Status: AC
  Administered 2020-08-04: 11:00:00 20 meq via ORAL
  Filled 2020-08-04: qty 1

## 2020-08-04 NOTE — Evaluation (Signed)
Speech Language Pathology Evaluation Patient Details Name: Chris Peterson MRN: [id number] DOB: 07/19/1930 Today's Date: 08/04/2020 Time: 1350-1410 SLP Time Calculation (min) (ACUTE ONLY): 20 min  Problem List:  Patient Active Problem List   Diagnosis Date Noted  . CVA (cerebral vascular accident) (HCC) 08/02/2020  . Pericarditis 02/23/2020  . Postop check 12/22/2019  . Diastolic heart failure (HCC) 12/01/2019  . GI bleed 03/13/2018  . Anemia associated with acute blood loss 03/13/2018  . Melena   . Acute gastric ulcer with hemorrhage   . Pericardial effusion   . Atherosclerotic renal artery stenosis, bilateral (HCC)   . HTN (hypertension) 11/28/2011  . AAA (abdominal aortic aneurysm) (HCC) 11/28/2011  . CKD (chronic kidney disease) 11/28/2011  . Diabetes mellitus type II 11/28/2011  . Tobacco abuse 11/28/2011   Past Medical History:  Past Medical History:  Diagnosis Date  . AAA (abdominal aortic aneurysm) (HCC)   . Atherosclerotic renal artery stenosis, bilateral (HCC)    Bilateral renal angiography 11/27/11, Right 70% prox, Left 90% prox s/p LRA PTA/Stenting - 6.0x12mm ;  12/12/2011 Renal Duplex: Patent LRA stent & > 60% RRA ost stenosis  . Atrial fibrillation (HCC) 11/19/2019  . Blood transfusion    S/P "lost blood due to hemorrhoids"  . Complication of anesthesia 2013   Severe Diarrhea aftter renal artery  . Dyspnea    with effusion  . Dysrhythmia    "fast one time"  . Hemorrhoids   . High cholesterol   . HTN (hypertension)   . Kidney disease, chronic, stage III (moderate, EGFR 30-59 ml/min) (HCC)    sees [facility]  . Pericardial effusion    s/p pericardiocentesis 11/2019 // reaccumulated >> s/p window 12/2019 // Echo 9/21: EF 60-65, no RWMA, moderate LVH, normal RVSF, severe BAE, small effusion posterior to the left ventricle (no evidence of tamponade), mild-moderate MR  . Pre-diabetes   . Sciatica    Past Surgical History:  Past Surgical History:   Procedure Laterality Date  . ABDOMINAL ANGIOGRAM N/A 11/27/2011   Procedure: ABDOMINAL ANGIOGRAM;  Surgeon: Michael Cooper, MD;  Location: [facility];  Service: Cardiovascular;  Laterality: N/A;  . BIOPSY  03/13/2018   Procedure: BIOPSY;  Surgeon: Stark, Malcolm T, MD;  Location: [facility];  Service: Endoscopy;;  . CATARACT EXTRACTION W/ INTRAOCULAR LENS  IMPLANT, BILATERAL  1990's  . ESOPHAGOGASTRODUODENOSCOPY N/A 03/13/2018   Procedure: ESOPHAGOGASTRODUODENOSCOPY (EGD);  Surgeon: Stark, Malcolm T, MD;  Location: [facility];  Service: Endoscopy;  Laterality: N/A;  . HOT HEMOSTASIS N/A 03/13/2018   Procedure: HOT HEMOSTASIS (ARGON PLASMA COAGULATION/BICAP);  Surgeon: Stark, Malcolm T, MD;  Location: [facility];  Service: Endoscopy;  Laterality: N/A;  . NASAL SEPTUM SURGERY  1980's  . PERICARDIOCENTESIS N/A 10/27/2019   Procedure: PERICARDIOCENTESIS;  Surgeon: Cooper, Michael, MD;  Location: [facility]AS[facility];  Service: Cardiovascular;  Laterality: N/A;  . RENAL ANGIOGRAM Bilateral 11/27/2011   Procedure: RENAL ANGIOGRAM;  Surgeon: Michael Cooper, MD;  Location: [facility];  Service: Cardiovascular;  Laterality: Bilateral;  possible PTA/stent  . RENAL ARTERY STENT  11/27/11  . RIGHT HEART CATH N/A 10/27/2019   Procedure: RIGHT HEART CATH;  Surgeon: Cooper, Michael, MD;  Location: [facility];  Service: Cardiovascular;  Laterality: N/A;  . SUBXYPHOID PERICARDIAL WINDOW N/A 12/09/2019   Procedure: SUBXYPHOID PERICARDIAL WINDOW;  Surgeon: Van Trigt, Peter, MD;  Location: [facility];  Service: Thoracic;  Laterality: N/A;  . TEE WITHOUT CARDIOVERSION N/A 12/09/2019   Procedure: TRANSESOPHAGEAL ECHOCARDIOGRAM (TEE);    Surgeon: Van Trigt, Peter, MD;  Location: [facility];  Service: Thoracic;  Laterality: N/A;   HPI:  Pt is an 86 y.o. male with a hx of sciatica, pre-diabetes, pericardial effusion, bilat atherosclerotic renal artery stenosis, paroxysmal a-fib, HTN, CKD stage 3B, AAA, GI bleed, HLD, and  chronic anemia who presented with partial vision loss in both eyes, lightheadedness, slurred speech, slight L-sided weakness, and confusion. CT was negative, but MRI revealed moderate R side PCA acute CVA involving temporo-occipital lobes along with small acute cortical infarcts of L frontal lobe and L parieto-occipital junction and MRA showed occlusion of R P2 PCA. NIHSS = 2. No tPA administered.   Assessment / Plan / Recommendation Clinical Impression  Pt presents with cognitive deficits s/p acute CVA, scoring a 15/30 per the SLUMS.  He performed well in tasks with a high language load, following multistep commands and generating lists of names.  Performance in clock-drawing task, digit/word recall, paragraph recall, mental math was impaired.  Pt's speech was fluent and without dysarthria. Safety/judgment were impaired.  He would benefit from CIR level therapies. D/W pt and dtr at bedside.  SLP will follow while in acute care to address basic cognition.    SLP Assessment  SLP Recommendation/Assessment: Patient needs continued Speech Lanaguage Pathology Services SLP Visit Diagnosis: Cognitive communication deficit (R41.841)    Follow Up Recommendations  Inpatient Rehab    Frequency and Duration min 2x/week  1 week      SLP Evaluation Cognition  Overall Cognitive Status: Impaired/Different from baseline Arousal/Alertness: Awake/alert Orientation Level: Oriented to person;Disoriented to time Attention: Focused Focused Attention: Appears intact Memory: Impaired Memory Impairment: Storage deficit;Retrieval deficit Awareness: Impaired Awareness Impairment: Intellectual impairment Problem Solving: Impaired Problem Solving Impairment: Verbal basic Executive Function: Reasoning Reasoning: Impaired Reasoning Impairment: Verbal basic Safety/Judgment: Impaired       Comprehension  Auditory Comprehension Overall Auditory Comprehension: Appears within functional limits for tasks  assessed Reading Comprehension Reading Status: Not tested    Expression Expression Primary Mode of Expression: Verbal Verbal Expression Overall Verbal Expression: Appears within functional limits for tasks assessed Written Expression Dominant Hand: Right Written Expression: Not tested   Oral / Motor  Oral Motor/Sensory Function Overall Oral Motor/Sensory Function: Within functional limits Motor Speech Overall Motor Speech: Appears within functional limits for tasks assessed   GO                    L. , MA CCC/SLP Acute Rehabilitation Services Office number [phone] Pager [phone]  ,  Laurice 08/04/2020, 2:19 PM    

## 2020-08-04 NOTE — Evaluation (Addendum)
Physical Therapy Evaluation Patient Details Name: Chris Peterson MRN: [id number] DOB: [birth year] Today's Date: 08/04/2020   History of Present Illness  Pt is an 85 y.o. male with a hx of sciatica, pre-diabetes, pericardial effusion, bilat atherosclerotic renal artery stenosis, paroxysmal a-fib, HTN, CKD stage 3B, AAA, GI bleed, HLD, and chronic anemia who presented with partial vision loss in both eyes, lightheadedness, slurred speech, slight L-sided weakness, and confusion. CT was negative, but MRI revealed moderate R side PCA acute CVA involving temporo-occipital lobes along with small acute cortical infarcts of L frontal lobe and L parieto-occipital junction and MRA showed occlusion of R P2 PCA. NIHSS = 2. No tPA administered.  Clinical Impression  Pt presents with condition mentioned above and deficits mentioned below, see PT Problem List. At baseline, pt is mod I for all functional mob with use of a quad-cane. He lives with his wife who has also had a stroke and he is unable to physically care for her. His 2 daughters are available 24/7 for assistance as needed. Pt displays confusion through initially being disoriented to location, believing he was at home. He also is impulsive and requires repeated cues to maintain his safety. Pt displayed decreased L hip flexion strength compared to his R along with L leg disdiadochokinesia and dysmetria, impacting his balance. He demonstrated poor motor control with gait while using a quad-cane in the R hand, staggering to the R often and poor foot placement resulting in bouts of LOB and modA to maintain safety. Pt also required minA for bed mob and transfers due to poor balance and coordination. His HR would drastically increase with all movement and was quickly returned to sitting in chair due to it rising to 140s during gait, see General Comments below. Due to the pt's drastic change in cognitive and physical status from his reported baseline placing him at  risk for falls and injuries, recommending CIR at d/c to maximize his independence and safety with all functional mobility. Pt's daughter reports pt drinks alcohol nightly and she is concerned for his safety and willing to consider long-term placement if needed. Will continue to follow acutely.    Follow Up Recommendations CIR;Supervision/Assistance - 24 hour    Equipment Recommendations  None recommended by PT    Recommendations for Other Services Rehab consult     Precautions / Restrictions Precautions Precautions: Fall Precaution Comments: HOH; monitor HR and BP Restrictions Weight Bearing Restrictions: No      Mobility  Bed Mobility Overal bed mobility: Needs Assistance Bed Mobility: Supine to Sit     Supine to sit: Min assist     General bed mobility comments: Bed flat, pt able to transition legs off EOB but displayed difficulty ascending trunk without use of bed rails, thus minA to ascend trunk.    Transfers Overall transfer level: Needs assistance   Transfers: Sit to/from Stand Sit to Stand: Min assist         General transfer comment: Pt quick to push up to stand, but displayed trunk sway and requires minA for safety.  Ambulation/Gait Ambulation/Gait assistance: Mod assist Gait Distance (Feet): 30 Feet Assistive device: Quad cane (in R hand) Gait Pattern/deviations: Staggering right;Decreased step length - right;Decreased stance time - left;Narrow base of support;Decreased stride length;Step-through pattern Gait velocity: reduced Gait velocity interpretation: <1.8 ft/sec, indicate of risk for recurrent falls General Gait Details: Ambulates with poor motor control, staggering to R often resulting in modA to maintain balance and safety. Demonstrates decreased L stance time  and thus decreased R step length. Narrow BOS. Cued pt to make step lengths symmetrical with min-no correction.  Stairs            Wheelchair Mobility    Modified Rankin (Stroke  Patients Only) Modified Rankin (Stroke Patients Only) Pre-Morbid Rankin Score: No significant disability Modified Rankin: Moderately severe disability     Balance Overall balance assessment: Needs assistance Sitting-balance support: Feet supported;Bilateral upper extremity supported Sitting balance-Leahy Scale: Poor Sitting balance - Comments: Static sitting EOB with mild trunk sway with UE support, difficulty maintaining balance with leg movement, min guard-minA. Postural control: Right lateral lean Standing balance support: Single extremity supported;During functional activity Standing balance-Leahy Scale: Poor Standing balance comment: R UE support on quad-cane and modA to maintain balance.                             Pertinent Vitals/Pain Pain Assessment: No/denies pain    Home Living Family/patient expects to be discharged to:: Private residence Living Arrangements: Spouse/significant other Available Help at Discharge: Family;Available 24 hours/day (2 adult daughters who daily check in on them) Type of Home: House Home Access: Stairs to enter Entrance Stairs-Rails: Right (ascending) Entrance Stairs-Number of Steps: 2 Home Layout: Two level;Bed/bath upstairs;1/2 bath on main level (bedroom on main floor if needed; stair lift available but he does not like to use it) Home Equipment: Walker - 4 wheels;Cane - quad;Walker - 2 wheels;Bedside commode;Tub bench;Hand held shower head;Wheelchair - manual (bath transfer chair) Additional Comments: Pt's daughter pulled PT aside and reported pt is an alcholic and drinks nightly and stated that the pt even told a MD he would not stop drinking. Pt's daughter willing to consider long-term placement for pt safety.    Prior Function Level of Independence: Independent with assistive device(s)         Comments: Daughters and wife do cleaning and cooking, but pt otherwise mod I with use of quad-cane for all mobility. Pt does not  provide physical assistance to wife who is 75 and has also had previous strokes.     Hand Dominance   Dominant Hand: Right    Extremity/Trunk Assessment   Upper Extremity Assessment Upper Extremity Assessment: Defer to OT evaluation    Lower Extremity Assessment Lower Extremity Assessment: RLE deficits/detail;LLE deficits/detail RLE Deficits / Details: MMT scores of the following noted: hip flexion 4, knee extension 5, knee flexion 4+, ankle dorsiflexion 5 RLE Sensation: WNL RLE Coordination: WNL LLE Deficits / Details: MMT scores of the following noted: hip flexion 3+, knee extension 5, knee flexion 4+, ankle dorsiflexion 4+ LLE Sensation: WNL LLE Coordination: decreased fine motor;decreased gross motor (dysdiadochokinesia and dysmetria noted)       Communication   Communication: HOH  Cognition Arousal/Alertness: Awake/alert Behavior During Therapy: Impulsive Overall Cognitive Status: Impaired/Different from baseline Area of Impairment: Orientation;Attention;Memory;Following commands;Safety/judgement;Awareness;Problem solving                 Orientation Level: Disoriented to;Place Current Attention Level: Sustained Memory: Decreased short-term memory Following Commands: Follows one step commands inconsistently;Follows one step commands with increased time Safety/Judgement: Decreased awareness of safety;Decreased awareness of deficits Awareness: Emergent Problem Solving: Slow processing;Decreased initiation;Difficulty sequencing;Requires verbal cues;Requires tactile cues General Comments: A&Ox4 with pt initially stating he was at home but then corrected and said he was at hospital. Pt's daughter reports he is confused and continues to try to get up OOB because he has been thinking he is at home  and is trying to stay on his normal schedule of getting up in morning to get shower. Pt impulsive and requires repeated cues to maintain safety. Pt unaware of deficits and displays  minimal corrections with functional mob to stay safe.      General Comments General comments (skin integrity, edema, etc.): BP 177/115 with RN clearing pt for <220/120; HR at rest in 80s but increased to 100-110s when coming to stand and then up to mid-140s with gait thus returned to sit in seat and ended session and notified RN    Exercises     Assessment/Plan    PT Assessment Patient needs continued PT services  PT Problem List Decreased strength;Decreased activity tolerance;Decreased balance;Decreased mobility;Decreased cognition;Decreased coordination;Decreased knowledge of use of DME;Decreased safety awareness;Decreased knowledge of precautions;Cardiopulmonary status limiting activity       PT Treatment Interventions DME instruction;Gait training;Stair training;Functional mobility training;Therapeutic exercise;Therapeutic activities;Balance training;Neuromuscular re-education;Cognitive remediation;Patient/family education    PT Goals (Current goals can be found in the Care Plan section)  Acute Rehab PT Goals Patient Stated Goal: to go home PT Goal Formulation: With patient/family Time For Goal Achievement: 08/18/20 Potential to Achieve Goals: Good    Frequency Min 4X/week   Barriers to discharge Other (comment) (daughter's concern of pt's nightly habits with alcohol)      Co-evaluation               AM-PAC PT "6 Clicks" Mobility  Outcome Measure Help needed turning from your back to your side while in a flat bed without using bedrails?: A Little Help needed moving from lying on your back to sitting on the side of a flat bed without using bedrails?: A Little Help needed moving to and from a bed to a chair (including a wheelchair)?: A Lot Help needed standing up from a chair using your arms (e.g., wheelchair or bedside chair)?: A Little Help needed to walk in hospital room?: A Lot Help needed climbing 3-5 steps with a railing? : A Lot 6 Click Score: 15    End of  Session Equipment Utilized During Treatment: Gait belt Activity Tolerance: Patient tolerated treatment well;Treatment limited secondary to medical complications (Comment) (drastic rise in HR) Patient left: in chair;with call bell/phone within reach;with chair alarm set;with family/visitor present Nurse Communication: Mobility status;Precautions;Other (comment) (HR and BP measures and impulsive tendencies) PT Visit Diagnosis: Unsteadiness on feet (R26.81);Other abnormalities of gait and mobility (R26.89);Muscle weakness (generalized) (M62.81);Difficulty in walking, not elsewhere classified (R26.2);Other symptoms and signs involving the nervous system (R29.898)    Time: 0821-0906 PT Time Calculation (min) (ACUTE ONLY): 45 min   Charges:   PT Evaluation $PT Eval Moderate Complexity: 1 Mod PT Treatments $Gait Training: 8-22 mins $Therapeutic Activity: 8-22 mins        Moishe Spice, PT, DPT Acute Rehabilitation Services  Pager: [phone] Office: [phone]   Orvan Falconer 08/04/2020, 11:49 AM

## 2020-08-04 NOTE — Progress Notes (Signed)
Inpatient Rehab Admissions Coordinator Note:   Per PT recommendations, pt was screened for CIR candidacy by Shann Medal, PT, DPT.  At this time we are recommending a CIR consult and I will place an order per our protocol.  Please contact me with questions.   Shann Medal, PT, DPT [phone] 08/04/20 2:09 PM

## 2020-08-04 NOTE — Progress Notes (Addendum)
[facility] Progress Note  Patient: Chris Peterson    FXT:[id number]  DOA: 08/02/2020     Date of Service: the patient was seen and examined on 08/04/2020  Brief hospital course: Past medical history of A. fib not on anticoagulation secondary to GI bleed, CKD 3B, AAA, HLD, chronic anemia.  Presents with acute CVA. Currently plan is for monitoring improvement in renal function and arrangement for CIR  Assessment and Plan: 1.  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 fib CT scan of the head shows hypoattenuation of the right temporoparietal region MRI brain confirms acute CVA MRI shows P2 occlusion. 2D echocardiogram EF 75%. LDL 43. Hemoglobin A1c 6.6. PT OT recommends CIR Patient is not on any antithrombotic therapy prior to admission. Currently on aspirin 325 mg daily. Neurology recommends initiation of anticoagulation starting 12/20. GI and vascular surgery are okay with initiation of the anticoagulation as well. Renal function will determine the type of anticoagulation  2.  Paroxysmal A. fib Rate control for now. Neurology recommending anticoagulation. GI and vascular surgery are okay with initiation of the anticoagulation. Discussed with patient and daughter in detail as well and both are agreeable for anticoagulation as well. Understand the risk for bleeding. Patient has mentioned that he does not want to have a situation like he had in the past with weakness after being on a blood thinner, likely secondary to anemia. Daughter had questions regarding what currently doing to control the A. fib.  Explained to her that the patient is currently rate controlled, based on the RA and LA on the echocardiogram patient will not maintain sinus rhythm and with him having bradycardia during sleep patient will not be a candidate for further rate control medication. If the patient remains symptomatic and during the daytime has bradycardic events  he may require evaluation by EP for pacemaker implant. Daughter is concerned regarding patient's risk for stroke in the setting of A. fib explained to her that if the patient remains on anticoagulation the risk will be less than what it was before when he was not on any anticoagulation.  3.  Hyperlipidemia LDL 43. Crestor will continue.  4.  Acute kidney injury on CKD 3B Renal function mildly worsened from the baseline. Currently urine output adequate. Patient is taking Lasix and lisinopril prior to admission which might have contributed to his current presentation. If renal function does not improve significantly will get ultrasound renal tomorrow.  5.  Prior history of GI bleed. EGD 2019 shows nonbleeding gastric ulcer with visible blood vessel. Treated with cautery. No further bleeding since then. Patient is not on anticoagulation since then.  Discussed with GI.  Currently plan is to initiate anticoagulation and monitor.  Appreciate assistance. Continue PPI.  6.  Active smoker Nicotine patch outpatient. Patient willing to quit.  7.  Reported delirium. Patient is alert awake and oriented x3. Daughter reports that the patient was delirious last night. Will monitor with telemetry sitter.  8.  Potential dementia. CT scan shows evidence of volume loss. Delirium reported by the patient daughter can be sundowning. Monitor for now.  9. Goals of care  Discuss in detail with the pt on 08/03/2020 and 08/04/2020.  Pt has living will that states DNR.  Pt wants to be full code for now based on my discussion with him. Daughter also wants full code.  Wife will be the primary HCPOA and daughters will be secondary.  On night of 12/16 pt informed night  cover that he wanted to be not on ventilator. And changed to limited code.  On repeat conversation pt wanted to be full code again.   Diet: D3 diet DVT Prophylaxis:   Place and maintain sequential compression device Start: 08/03/20 0934     Advance goals of care discussion: Full code  Family Communication: family was present at bedside, at the time of interview.  The pt provided permission to discuss medical plan with the family. Opportunity was given to ask question and all questions were answered satisfactorily.   Disposition:  Status is: Inpatient  Remains inpatient appropriate because:Ongoing diagnostic testing needed not appropriate for outpatient work up   Dispo: The patient is from: Home              Anticipated d/c is to: to be determined              Anticipated d/c date is: 1 day              Patient currently is not medically stable to d/c.        Subjective: No nausea no vomiting.  Patient denies any acute complaint.  Reported confusion overnight.  Physical Exam:  General: Appear in mild distress, no Rash; Oral Mucosa Clear, moist. no Abnormal Neck Mass Or lumps, Conjunctiva normal  Cardiovascular: S1 and S2 Present, no Murmur, Respiratory: good respiratory effort, Bilateral Air entry present and CTA, no Crackles, no wheezes Abdomen: Bowel Sound present, Soft and no tenderness Extremities: no Pedal edema Neurology: alert and oriented to time, place, and person affect appropriate. no new focal deficit Gait not checked due to patient safety concerns  Vitals:   08/04/20 0937 08/04/20 1238 08/04/20 1337 08/04/20 1556  BP: (!) 161/87 (!) 170/117 (!) 152/81 (!) 177/112  Pulse: 97 92 79 90  Resp: 16 18  18   Temp: 97.8 F (36.6 C) 97.8 F (36.6 C)  98.4 F (36.9 C)  TempSrc: Oral Oral  Oral  SpO2: 98% 98%  98%  Weight:      Height:        Intake/Output Summary (Last 24 hours) at 08/04/2020 1958 Last data filed at 08/04/2020 1240 Gross per 24 hour  Intake 360 ml  Output 300 ml  Net 60 ml   Filed Weights   08/02/20 1138  Weight: 61.2 kg    Data Reviewed: I have personally reviewed and interpreted daily labs, tele strips, imagings as discussed above. I reviewed all nursing notes,  pharmacy notes, vitals, pertinent old records I have discussed plan of care as described above with RN and patient/family.  CBC: Recent Labs  Lab 08/02/20 1241 08/03/20 0907  WBC 5.2 6.1  NEUTROABS 3.0  --   HGB 10.9* 11.8*  HCT 35.1* 37.7*  MCV 86.7 88.5  PLT 134* 761*   Basic Metabolic Panel: Recent Labs  Lab 08/02/20 1241 08/03/20 0907 08/04/20 0453 08/04/20 1804  NA 143 145 142 145  K 3.7 3.7 3.2* 3.7  CL 106 107 107 108  CO2 27 19* 23 24  GLUCOSE 159* 103* 136* 102*  BUN 38* 34* 30* 26*  CREATININE 1.80* 1.74* 1.85* 1.73*  CALCIUM 9.0 8.9 8.8* 9.0  MG  --   --  1.9 1.9    Studies: No results found.  Scheduled Meds: . aspirin  325 mg Oral Daily  . cholecalciferol  1,000 Units Oral Daily  . pantoprazole  40 mg Oral Daily  . QUEtiapine  12.5 mg Oral QHS  . rosuvastatin  40 mg Oral Daily  . tamsulosin  0.4 mg Oral Daily   Continuous Infusions: PRN Meds: acetaminophen **OR** acetaminophen (TYLENOL) oral liquid 160 mg/5 mL **OR** acetaminophen, senna-docusate  Time spent: 35 minutes  Author: Berle Mull, MD [facility] 08/04/2020 7:58 PM  To reach On-call, see care teams to locate the attending and reach out via [email or web address]. Between 7PM-7AM, please contact night-coverage If you still have difficulty reaching the attending provider, please page the [facility] (Director on Call) for [facility] on amion for assistance.

## 2020-08-04 NOTE — Evaluation (Signed)
Occupational Therapy Evaluation Patient Details Name: Chris Peterson MRN: [id number] DOB: 02-28-1930 Today's Date: 08/04/2020    History of Present Illness Pt is an 86 y.o. male with a hx of sciatica, pre-diabetes, pericardial effusion, bilat atherosclerotic renal artery stenosis, paroxysmal a-fib, HTN, CKD stage 3B, AAA, GI bleed, HLD, and chronic anemia who presented with partial vision loss in both eyes, lightheadedness, slurred speech, slight L-sided weakness, and confusion. CT was negative, but MRI revealed moderate R side PCA acute CVA involving temporo-occipital lobes along with small acute cortical infarcts of L frontal lobe and L parieto-occipital junction and MRA showed occlusion of R P2 PCA. NIHSS = 2. No tPA administered.   Clinical Impression   PTA, pt was living at home with his wife, pt reports (daughter confirms) pt was independent with ADL, daughters and wife assist with IADL and pt was independent with functional mobility. Pt currently demonstrates decreased awareness of deficits, decreased safety awareness, impulsivity, cognitive limitations, instability, generalized weakness impacting safety and independence with ADL/IADL and functional mobility. Pt demonstrates left superior visual field limitations. He was limited with mobility and ADL engagement this session secondary to tachy HR. 100bpm at rest, up to 124bpm with bed mobility, 136bpm ambulation to sink, with seated rest break able to return to 110bpm;during grooming at sink HR 124bpm, up to 143bpm with return to bed.  Due to decline in current level of function, pt would benefit from acute OT to address established goals to facilitate safe D/C to venue listed below. At this time, recommend CIR follow-up. Will continue to follow acutely.     Follow Up Recommendations  CIR    Equipment Recommendations  3 in 1 bedside commode    Recommendations for Other Services       Precautions / Restrictions  Precautions Precautions: Fall Precaution Comments: HOH; monitor HR and BP Restrictions Weight Bearing Restrictions: No      Mobility Bed Mobility Overal bed mobility: Needs Assistance Bed Mobility: Supine to Sit     Supine to sit: Min guard     General bed mobility comments: minguard for safety, pt with increased time and effort    Transfers Overall transfer level: Needs assistance Equipment used: Rolling walker (2 wheeled) Transfers: Sit to/from Stand Sit to Stand: Min assist         General transfer comment: minA for safety and stability    Balance Overall balance assessment: Needs assistance Sitting-balance support: Feet supported;Bilateral upper extremity supported Sitting balance-Leahy Scale: Fair Sitting balance - Comments: able to sit EOB with minguard assistance, no LOB   Standing balance support: Single extremity supported;During functional activity Standing balance-Leahy Scale: Poor Standing balance comment: at least single UE support while standing                           ADL either performed or assessed with clinical judgement   ADL Overall ADL's : Needs assistance/impaired Eating/Feeding: Set up;Sitting   Grooming: Set up;Sitting Grooming Details (indicate cue type and reason): pt with increased effort to open items,cues for sequencing, to open toothpaste (unsure if visual limitation) Upper Body Bathing: Min guard;Sitting   Lower Body Bathing: Min guard;Sit to/from stand   Upper Body Dressing : Min guard;Sitting   Lower Body Dressing: Minimal assistance;Sit to/from stand   Toilet Transfer: Minimal assistance;Ambulation;RW Toilet Transfer Details (indicate cue type and reason): minA for safety, stability and safe use of RW         Functional mobility  during ADLs: Minimal assistance;Rolling walker General ADL Comments: pt limited by decreased activity tolerance, cardiopulmonary limitations tachy HR with exertion: 100bpm at rest,  up to 124bpm with bed mobility, 136bpm ambulation to sink, with seated rest break able to return to 110bpm;during grooming at sink HR 124bpm, up to 143bpm with return to bed     Vision Baseline Vision/History: Wears glasses Wears Glasses: At all times Patient Visual Report: Blurring of vision Vision Assessment?: Yes Eye Alignment: Within Functional Limits Ocular Range of Motion: Restricted on the left Alignment/Gaze Preference: Within Defined Limits Tracking/Visual Pursuits: Decreased smoothness of eye movement to LEFT superior field;Requires cues, head turns, or add eye shifts to track;Unable to hold eye position out of midline Saccades: Additional head turns occurred during testing;Additional eye shifts occurred during testing;Decreased speed of saccadic movement Convergence: Impaired (comment) (left eye does not converge) Visual Fields: Left visual field deficit Additional Comments: continue to further assess, pt demonstrates limitations with left visual field especially superior field     Perception     Praxis      Pertinent Vitals/Pain Pain Assessment: No/denies pain     Hand Dominance Right   Extremity/Trunk Assessment Upper Extremity Assessment Upper Extremity Assessment: Overall WFL for tasks assessed   Lower Extremity Assessment Lower Extremity Assessment: Defer to PT evaluation   Cervical / Trunk Assessment Cervical / Trunk Assessment: Kyphotic   Communication Communication Communication: HOH   Cognition Arousal/Alertness: Awake/alert Behavior During Therapy: Impulsive Overall Cognitive Status: Impaired/Different from baseline Area of Impairment: Orientation;Attention;Memory;Following commands;Safety/judgement;Awareness;Problem solving                 Orientation Level: Disoriented to;Place Current Attention Level: Sustained Memory: Decreased short-term memory Following Commands: Follows one step commands inconsistently;Follows one step commands with  increased time Safety/Judgement: Decreased awareness of safety;Decreased awareness of deficits Awareness: Emergent Problem Solving: Slow processing;Decreased initiation;Difficulty sequencing;Requires verbal cues;Requires tactile cues General Comments: pt impulsive, pt reporting he is at a doctor's office despite orientation to hospital. Pt with decreased short term memory, did not recall seeing MD this morning, Pt able to recall name and address after 5 min;pt scored 2/28 on the Short Blessed Test to screen for cognitive status, pt's score indicates normal functioning. However, functional cognition presents with limitations. Pt would benefit from a more thorough cognition assessment. Pt with decreased awareness of deficits and decreased safety awareness;required cues for sequencing during gooming activity at sink level   General Comments       Exercises     Shoulder Instructions      Home Living Family/patient expects to be discharged to:: Private residence Living Arrangements: Spouse/significant other Available Help at Discharge: Family;Available 24 hours/day Type of Home: House Home Access: Stairs to enter [employer] of Steps: 2 Entrance Stairs-Rails: Right (ascending) Home Layout: Two level;Bed/bath upstairs;1/2 bath on main level (bedroom on main floor if needed; stair lift available but he does not like to use it) Alternate Level Stairs-Number of Steps: 10 Alternate Level Stairs-Rails: Right (ascending) Bathroom Shower/Tub: Occupational psychologist: Standard Bathroom Accessibility: No   Home Equipment: Environmental consultant - 4 wheels;Cane - quad;Walker - 2 wheels;Bedside commode;Tub bench;Hand held shower head;Wheelchair - manual (bath transfer chair)   Additional Comments: Pt's daughter pulled PT aside and reported pt is an alcholic and drinks nightly and stated that the pt even told a MD he would not stop drinking. Pt's daughter willing to consider long-term placement for  pt safety.  Lives With: Spouse    Prior Functioning/Environment Level  of Independence: Independent with assistive device(s)        Comments: Daughters and wife do cleaning and cooking, but pt otherwise mod I with use of quad-cane for all mobility. Pt does not provide physical assistance to wife who is 79 and has also had previous strokes.        OT Problem List: Decreased strength;Decreased range of motion;Decreased activity tolerance;Impaired balance (sitting and/or standing);Decreased cognition;Decreased safety awareness;Decreased knowledge of use of DME or AE;Decreased knowledge of precautions;Cardiopulmonary status limiting activity      OT Treatment/Interventions:      OT Goals(Current goals can be found in the care plan section) Acute Rehab OT Goals Patient Stated Goal: to go home OT Goal Formulation: With patient/family Time For Goal Achievement: 08/18/20 Potential to Achieve Goals: Good ADL Goals Pt Will Perform Grooming: with supervision;standing Pt Will Transfer to Toilet: with supervision;ambulating Additional ADL Goal #1: Pt will demonstrate use of energy conservation strategies during ADL completion with <3 verbal cues. Additional ADL Goal #2: Pt will complete multistep cognition task with <3 errors for safe engagement in home environment.  OT Frequency: Min 2X/week   Barriers to D/C:            Co-evaluation              AM-PAC OT "6 Clicks" Daily Activity     Outcome Measure Help from another person eating meals?: A Little Help from another person taking care of personal grooming?: A Little Help from another person toileting, which includes using toliet, bedpan, or urinal?: A Little Help from another person bathing (including washing, rinsing, drying)?: A Little Help from another person to put on and taking off regular upper body clothing?: A Little Help from another person to put on and taking off regular lower body clothing?: A Little 6 Click Score:  18   End of Session Equipment Utilized During Treatment: Gait belt;Rolling walker Nurse Communication: Mobility status (HR)  Activity Tolerance: Patient tolerated treatment well;Treatment limited secondary to medical complications (Comment) (cardiopulmonary status) Patient left: in bed;with call bell/phone within reach;with bed alarm set;with family/visitor present  OT Visit Diagnosis: Unsteadiness on feet (R26.81);Other abnormalities of gait and mobility (R26.89);Muscle weakness (generalized) (M62.81);Other symptoms and signs involving cognitive function;Low vision, both eyes (H54.2)                Time: 6333-5456 OT Time Calculation (min): 30 min Charges:  OT General Charges $OT Visit: 1 Visit OT Evaluation $OT Eval Moderate Complexity: 1 Mod OT Treatments $Self Care/Home Management : 8-22 mins  Helene Kelp OTR/L Acute Rehabilitation Services Office: [phone]   Wyn Forster 08/04/2020, 3:09 PM

## 2020-08-04 NOTE — Plan of Care (Signed)
  Problem: Education: Goal: Knowledge of General Education information will improve Description: Including pain rating scale, medication(s)/side effects and non-pharmacologic comfort measures Outcome: Progressing   Problem: Safety: Goal: Ability to remain free from injury will improve Outcome: Progressing   Problem: Ischemic Stroke/TIA Tissue Perfusion: Goal: Complications of ischemic stroke/TIA will be minimized Outcome: Progressing

## 2020-08-04 NOTE — Progress Notes (Signed)
STROKE TEAM PROGRESS NOTE   INTERVAL HISTORY Daughter at bedside.  Patient lying in bed, eyes open.  Daughter reported that patient overnight had delirium, told he has confusion.  However on exam, patient awake alert oriented x3.  After I left the room, patient had confusion episode trying to get out of bed.  However, easily redirectable.  Vitals:   08/03/20 1700 08/03/20 1800 08/03/20 2018 08/03/20 2316  BP: (!) 158/95 (!) 155/92 (!) 140/93 (!) 155/81  Pulse: 96 (!) 33 98 70  Resp: 16 18 18 18   Temp:   98.3 F (36.8 C) 99.2 F (37.3 C)  TempSrc:   Oral Oral  SpO2: 96% 96% 96%   Weight:      Height:       CBC:  Recent Labs  Lab 08/02/20 1241 08/03/20 0907  WBC 5.2 6.1  NEUTROABS 3.0  --   HGB 10.9* 11.8*  HCT 35.1* 37.7*  MCV 86.7 88.5  PLT 134* 185*   Basic Metabolic Panel:  Recent Labs  Lab 08/02/20 1241 08/03/20 0907  NA 143 145  K 3.7 3.7  CL 106 107  CO2 27 19*  GLUCOSE 159* 103*  BUN 38* 34*  CREATININE 1.80* 1.74*  CALCIUM 9.0 8.9   Lipid Panel:  Recent Labs  Lab 08/03/20 0415  CHOL 113  TRIG 106  HDL 49  CHOLHDL 2.3  VLDL 21  LDLCALC 43   HgbA1c:  Recent Labs  Lab 08/03/20 0415  HGBA1C 6.6*   Urine Drug Screen: No results for input(s): LABOPIA, COCAINSCRNUR, LABBENZ, AMPHETMU, THCU, LABBARB in the last 168 hours.  Alcohol Level No results for input(s): ETH in the last 168 hours.  IMAGING past 24 hours ECHOCARDIOGRAM COMPLETE  Result Date: 08/03/2020    ECHOCARDIOGRAM REPORT   Patient Name:   Chris Peterson Date of Exam: 08/03/2020 Medical Rec #:  [id number]        Height:       69.0 in Accession #:    [id number]       Weight:       135.0 lb Date of Birth:  11/25/1929        BSA:          1.748 m Patient Age:    39 years         BP:           169/96 mmHg Patient Gender: M                HR:           94 bpm. Exam Location:  Inpatient Procedure: 2D Echo, Cardiac Doppler and Color Doppler Indications:    TIA 435.9 / G45.9  History:         Patient has prior history of Echocardiogram examinations, most                 recent 05/02/2020. Arrythmias:Atrial Fibrillation,                 Signs/Symptoms:Dyspnea; Risk Factors:Hypertension and                 Dyslipidemia. Abdominal Aortic Aneurysm. CKD. Pericardial                 effusion.  Sonographer:    Jonelle Sidle Dance Referring Phys: [id number] [place]  1. Left ventricular ejection fraction, by estimation, is >75%. The left ventricle has hyperdynamic function. The left ventricle has no regional wall motion abnormalities. There  is mild left ventricular hypertrophy. Left ventricular diastolic function could not be evaluated.  2. Right ventricular systolic function is normal. The right ventricular size is normal. Tricuspid regurgitation signal is inadequate for assessing PA pressure.  3. Left atrial size was severely dilated.  4. Right atrial size was mildly dilated.  5. The mitral valve is normal in structure. Mild mitral valve regurgitation. No evidence of mitral stenosis.  6. The aortic valve is calcified. Aortic valve regurgitation is mild. Mild to moderate aortic valve sclerosis/calcification is present, without any evidence of aortic stenosis.  7. The inferior vena cava is normal in size with greater than 50% respiratory variability, suggesting right atrial pressure of 3 mmHg. FINDINGS  Left Ventricle: Left ventricular ejection fraction, by estimation, is >75%. The left ventricle has hyperdynamic function. The left ventricle has no regional wall motion abnormalities. The left ventricular internal cavity size was normal in size. There is mild left ventricular hypertrophy. Left ventricular diastolic function could not be evaluated due to atrial fibrillation. Left ventricular diastolic function could not be evaluated. Right Ventricle: The right ventricular size is normal.Right ventricular systolic function is normal. Tricuspid regurgitation signal is inadequate for assessing PA pressure. The  tricuspid regurgitant velocity is 2.38 m/s, and with an assumed right atrial pressure of 3 mmHg, the estimated right ventricular systolic pressure is 02.7 mmHg. Left Atrium: Left atrial size was severely dilated. Right Atrium: Right atrial size was mildly dilated. Pericardium: Trivial pericardial effusion is present. Mitral Valve: The mitral valve is normal in structure. Mild mitral annular calcification. Mild mitral valve regurgitation. No evidence of mitral valve stenosis. Tricuspid Valve: The tricuspid valve is normal in structure. Tricuspid valve regurgitation is mild . No evidence of tricuspid stenosis. Aortic Valve: The aortic valve is calcified. Aortic valve regurgitation is mild. Aortic regurgitation PHT measures 715 msec. Mild to moderate aortic valve sclerosis/calcification is present, without any evidence of aortic stenosis. Aortic valve mean gradient measures 7.5 mmHg. Aortic valve peak gradient measures 15.6 mmHg. Aortic valve area, by VTI measures 1.98 cm. Pulmonic Valve: The pulmonic valve was not well visualized. Pulmonic valve regurgitation is not visualized. No evidence of pulmonic stenosis. Aorta: The aortic root is normal in size and structure. Venous: The inferior vena cava is normal in size with greater than 50% respiratory variability, suggesting right atrial pressure of 3 mmHg.  LEFT VENTRICLE PLAX 2D LVIDd:         3.45 cm LVIDs:         2.85 cm LV PW:         1.75 cm LV IVS:        1.20 cm LVOT diam:     2.10 cm LV SV:         70 LV SV Index:   40 LVOT Area:     3.46 cm  RIGHT VENTRICLE          IVC RV Basal diam:  3.47 cm  IVC diam: 1.60 cm RV Mid diam:    2.53 cm TAPSE (M-mode): 1.5 cm LEFT ATRIUM              Index       RIGHT ATRIUM           Index LA diam:        4.80 cm  2.75 cm/m  RA Area:     23.00 cm LA Vol (A2C):   105.0 ml 60.06 ml/m RA Volume:   62.70 ml  35.87 ml/m LA Vol (A4C):  75.4 ml  43.13 ml/m LA Biplane Vol: 92.8 ml  53.08 ml/m  AORTIC VALVE AV Area (Vmax):     2.11 cm AV Area (Vmean):   2.24 cm AV Area (VTI):     1.98 cm AV Vmax:           197.50 cm/s AV Vmean:          125.000 cm/s AV VTI:            0.355 m AV Peak Grad:      15.6 mmHg AV Mean Grad:      7.5 mmHg LVOT Vmax:         120.50 cm/s LVOT Vmean:        80.750 cm/s LVOT VTI:          0.203 m LVOT/AV VTI ratio: 0.57 AI PHT:            715 msec  AORTA Ao Root diam: 3.80 cm Ao Asc diam:  3.60 cm MITRAL VALVE                TRICUSPID VALVE MV Area (PHT): 3.99 cm     TR Peak grad:   22.7 mmHg MV Decel Time: 190 msec     TR Vmax:        238.00 cm/s MV E velocity: 155.00 cm/s MV A velocity: 56.20 cm/s   SHUNTS MV E/A ratio:  2.76         Systemic VTI:  0.20 m                             Systemic Diam: 2.10 cm Kirk Ruths MD Electronically signed by Kirk Ruths MD Signature Date/Time: 08/03/2020/12:27:01 PM    Final     PHYSICAL EXAM  Temp:  [97.8 F (36.6 C)-99.2 F (37.3 C)] 99.2 F (37.3 C) (12/16 2316) Pulse Rate:  [33-110] 70 (12/16 2316) Resp:  [15-22] 18 (12/16 2316) BP: (140-180)/(81-96) 155/81 (12/16 2316) SpO2:  [93 %-96 %] 96 % (12/16 2018)  General - Well nourished, well developed, in no apparent distress.  Ophthalmologic - fundi not visualized due to noncooperation.  Cardiovascular - irregularly irregular heart rate and rhythm  Mental Status -  Level of arousal and orientation to time, place, and person were intact. Language including expression, naming, repetition, comprehension was assessed and found intact.  No apraxia. Attention span and concentration were normal. Recent and remote memory were 3/3 registration and 0/3 delayed recall. Fund of Knowledge was assessed and was intact.  Cranial Nerves II - XII - II - Visual field intact OU except intermittent difficulty with right upper quadrant. III, IV, VI - Extraocular movements intact. V - Facial sensation intact bilaterally. VII - Facial movement intact bilaterally. VIII - Hearing & vestibular intact  bilaterally. X - Palate elevates symmetrically. XI - Chin turning & shoulder shrug intact bilaterally. XII - Tongue protrusion intact.  Motor Strength - The patient's strength was normal in all extremities and pronator drift was absent.  Bulk was normal and fasciculations were absent.   Motor Tone - Muscle tone was assessed at the neck and appendages and was normal.  Reflexes - The patient's reflexes were symmetrical in all extremities and he had no pathological reflexes.  Sensory - Light touch, temperature/pinprick were assessed and were symmetrical.    Coordination - The patient had normal movements in the hands with no ataxia or dysmetria.  Tremor was absent.  Gait  and Station - deferred.   ASSESSMENT/PLAN [name] is a 86 y.o. male with history of paroxysmal Afib not on AC due to history of GI bleed (5-6 years ago controlled vessel hemorrhage via cauterization), CKD stage IIIB, AAA, HLD, DM2 (6.3% A1c 12/07/19), chronic anemia, and bilateral atherosclerotic renal artery stenosis, and pericardial effusion (drained 750 cc in late April 2021) presenting with confusion, lightheadedness, and vision changes.   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 Small L cerebellum, L frontal lobe and L parieto-occipital jxn infarcts.  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 Distal L MCA branch irregularity. Plaque at ICA origins.   2D Echo EF > 75%.  RA and LA severely dilated.   LDL 43  HgbA1c 6.6  VTE prophylaxis - SCDs   No antithrombotic prior to admission, now on aspirin 325 mg daily.  Given his gastric ulcer has been cauterized, no concern for [facility] with AAA per Dr. Trula Slade, will recommend eliquis 2.5mg  bid on 12/17 to avoid hemorrhagic conversion.  Once anticoagulant started, aspirin can be discontinued.   Therapy recommendations:   pending   Disposition:  pending   Paroxysmal Atrial Fibrillation  Home anticoagulation:  none d/t hx GIB  . 02/2018 gastric ulcer cauterized . Regarding AAAs, Dr. Trula Slade has no concern for an evaluation . Currently on aspirin 325 . Rate controlled  will recommend eilquis 2.5mg  bid on 12/17 to avoid hemorrhagic conversion. Once anticoagulant started, aspirin can be discontinued.    Hypertension  Stable . Permissive hypertension (OK if < 220/120) but gradually normalize in 3-5 days . Long-term BP goal normotensive  Hyperlipidemia  Home meds:  crestor 40, resumed in hospital  LDL 43, goal < 70  Continue Crestor 40  Continue statin at discharge  Pre-Diabetes   HgbA1c 6.6, goal < 7.0  CBGs  SSI  PCP follow-up as outpatient  Tobacco abuse  Current smoker  Smoking cessation counseling provided  Pt is willing to quit  In- hospital delirium  Sun downing  Consider seroquel if needed  Other Stroke Risk Factors  Advanced Age >/= 81   ETOH use, alcohol level, advised to drink no more than 2 drink(s) a day  AAA - followed with Dr. Trula Slade, stable  B/l RAS  Other Active Problems  CKD stage III creatinine 1.8  GIB - 02/2018 EGD showed a nonbleeding gastric ulcer with visible vessel which was treated with cautery.  Pericardial effusion drained in 11/2019, repeat TTE and CT showed near resolution of effusion  Hospital day # 2  Neurology will sign off. Please call with questions. Pt will follow up with stroke clinic NP at [facility] in about 4 weeks. Thanks for the consult.  Rosalin Hawking, MD PhD Stroke Neurology 08/04/2020 9:25 PM  To contact Stroke Continuity provider, please refer to [email or web address] After hours, contact General Neurology

## 2020-08-04 NOTE — Consult Note (Signed)
Physical Medicine and Rehabilitation Consult Reason for Consult: Functional deficits due to stroke Referring Physician: Berle Mull, MD   (Copied from H&P note due to incorrect note type) HPI: Chris Peterson is a 86 y.o. male with history of PAF- off AC due to GIB, AAA, CKD IIIb, HTN, ongoing alcohol abuse per daughter, anemia; who was admitted on 08/02/2020 with visual disturbance, dizziness, dysarthria, and confusion.  History taken from chart review, patient, and daughter.  Vision has improved, but he was found to have mild left sided weakness. MRI/MRA brain done revealing moderate sized right PCA infarct affecting temporo-occipital lobes, occlusion of right P2 PCA, small cortical infarcts in left frontal and left parieto-occipital junction.  Echocardiogram with ejection fraction >75% with hyperdynamic function, severe LA dilatation and mild to moderate AS.  Dr. Erlinda Hong felt that stroke was embolic due to A. fib and lack of anticoagulation.  Case was discussed with Dr. Trula Slade and patient cleared to start anticoagulation 5 days post stroke.  Therapy evaluations completed today revealing right hemiparesis with dysmetria, poor motor control, loss of balance as well as heart rates up to 140s with activity, cognitive deficits with 15/30 per SLUMS. Patient with functional decline and CIR recommended for follow-up therapy.   ROS  (Copied from H&P note due to incorrect note type) Constitutional: Negative for chills, fever and malaise/fatigue.  HENT: Positive for hearing loss.   Eyes: Negative for blurred vision and double vision.  Respiratory: Positive for shortness of breath ("walking to the mailbox"). Negative for cough.   Cardiovascular: Negative for chest pain and palpitations.  Gastrointestinal: Positive for heartburn. Negative for diarrhea and nausea.  Musculoskeletal: Positive for back pain and falls (recently). Negative for joint pain and myalgias.  Skin: Negative for rash.   Neurological: Negative for dizziness, sensory change, speech change, focal weakness, weakness and headaches.  Psychiatric/Behavioral: The patient is not nervous/anxious.   All other systems reviewed and are negative.  Past Medical History:  Diagnosis Date  . AAA (abdominal aortic aneurysm) ([place])   . Atherosclerotic renal artery stenosis, bilateral ([facility])    Bilateral renal angiography 11/27/11, Right 70% prox, Left 90% prox s/p LRA PTA/Stenting - 6.0x54m ;  12/12/2011 Renal Duplex: Patent LRA stent & > 60% RRA ost stenosis  . Atrial fibrillation ([facility] 11/19/2019  . Blood transfusion    S/P "lost blood due to hemorrhoids"  . Complication of anesthesia 2013   Severe Diarrhea aftter renal artery  . Dyspnea    with effusion  . Dysrhythmia    "fast one time"  . Hemorrhoids   . High cholesterol   . HTN (hypertension)   . Kidney disease, chronic, stage III (moderate, EGFR 30-59 ml/min) ([facility])    [facility]  . Pericardial effusion    s/p pericardiocentesis 11/2019 // reaccumulated >> s/p window 12/2019 // Echo 9/21: EF 60-65, no RWMA, moderate LVH, normal RVSF, severe BAE, small effusion posterior to the left ventricle (no evidence of tamponade), mild-moderate MR  . Pre-diabetes   . Sciatica    Past Surgical History:  Procedure Laterality Date  . ABDOMINAL ANGIOGRAM N/A 11/27/2011   Procedure: ABDOMINAL ANGIOGRAM;  Surgeon: MSherren Mocha MD;  [facility];  Service: Cardiovascular;  Laterality: N/A;  . BIOPSY  03/13/2018   Procedure: BIOPSY;  Surgeon: SLadene Artist MD;  Location: [facility] ENDOSCOPY;  Service: Endoscopy;;  . CATARACT EXTRACTION W/ INTRAOCULAR LENS  IMPLANT, BILATERAL  1990's  . ESOPHAGOGASTRODUODENOSCOPY N/A 03/13/2018   Procedure: ESOPHAGOGASTRODUODENOSCOPY (  EGD);  Surgeon: Ladene Artist, MD;  Location: [facility];  Service: Endoscopy;  Laterality: N/A;  . HOT HEMOSTASIS N/A 03/13/2018   Procedure: HOT HEMOSTASIS (ARGON PLASMA COAGULATION/BICAP);   Surgeon: Ladene Artist, MD;  Location: [facility];  Service: Endoscopy;  Laterality: N/A;  . NASAL SEPTUM SURGERY  1980's  . PERICARDIOCENTESIS N/A 10/27/2019   Procedure: PERICARDIOCENTESIS;  Surgeon: Sherren Mocha, MD;  Location: [facility] CV LAB;  Service: Cardiovascular;  Laterality: N/A;  . RENAL ANGIOGRAM Bilateral 11/27/2011   Procedure: RENAL ANGIOGRAM;  Surgeon: Sherren Mocha, MD;  Location: [facility] CATH LAB;  Service: Cardiovascular;  Laterality: Bilateral;  possible PTA/stent  . RENAL ARTERY STENT  11/27/11  . RIGHT HEART CATH N/A 10/27/2019   Procedure: RIGHT HEART CATH;  Surgeon: Sherren Mocha, MD;  Location: [facility] CV LAB;  Service: Cardiovascular;  Laterality: N/A;  . SUBXYPHOID PERICARDIAL WINDOW N/A 12/09/2019   Procedure: SUBXYPHOID PERICARDIAL WINDOW;  Surgeon: Ivin Poot, MD;  Location: [facility];  Service: Thoracic;  Laterality: N/A;  . TEE WITHOUT CARDIOVERSION N/A 12/09/2019   Procedure: TRANSESOPHAGEAL ECHOCARDIOGRAM (TEE);  Surgeon: Prescott Gum, Collier Salina, MD;  Location: [facility] OR;  Service: Thoracic;  Laterality: N/A;   Family History  Problem Relation Age of Onset  . Hypertension Mother   . Hypertension Brother    Social History:  reports that he has been smoking pipe and cigarettes. He has smoked for the past 65.00 years. He has never used smokeless tobacco. He reports current alcohol use of about 10.0 standard drinks of alcohol per week. He reports that he does not use drugs. Allergies: No Known Allergies Medications Prior to Admission  Medication Sig Dispense Refill  . carvedilol (COREG) 12.5 MG tablet Take 12.5 mg by mouth 2 (two) times daily.    . cholecalciferol (VITAMIN D) 25 MCG (1000 UNIT) tablet Take 1 tablet (1,000 Units total) by mouth daily.    . furosemide (LASIX) 40 MG tablet Take 40 mg by mouth daily.    [name] lisinopril (ZESTRIL) 10 MG tablet Take 10 mg by mouth daily.    . rosuvastatin (CRESTOR) 40 MG tablet Take 40 mg by mouth daily.    . tamsulosin  (FLOMAX) 0.4 MG CAPS capsule Take 0.4 mg by mouth daily.      Home: Home Living Family/patient expects to be discharged to:: Private residence Living Arrangements: Spouse/significant other Available Help at Discharge: Family,Available 24 hours/day Type of Home: House Home Access: Stairs to enter [employer] of Steps: 2 Entrance Stairs-Rails: Right (ascending) Home Layout: Two level,Bed/bath upstairs,1/2 bath on main level (bedroom on main floor if needed; stair lift available but he does not like to use it) Alternate Level Stairs-Number of Steps: 10 Alternate Level Stairs-Rails: Right (ascending) Bathroom Shower/Tub: Multimedia programmer: Standard Bathroom Accessibility: No Home Equipment: Environmental consultant - 4 wheels,Cane - quad,Walker - 2 wheels,Bedside commode,Tub bench,Hand held shower head,Wheelchair - manual (bath transfer chair) Additional Comments: Pt's daughter pulled PT aside and reported pt is an alcholic and drinks nightly and stated that the pt even told a MD he would not stop drinking. Pt's daughter willing to consider long-term placement for pt safety.  Lives With: Spouse  Functional History: Prior Function Level of Independence: Independent with assistive device(s) Comments: Daughters and wife do cleaning and cooking, but pt otherwise mod I with use of quad-cane for all mobility. Pt does not provide physical assistance to wife who is 51 and has also had previous strokes. Functional Status:  Mobility: Bed Mobility  Overal bed mobility: Needs Assistance Bed Mobility: Supine to Sit Supine to sit: Min guard General bed mobility comments: minguard for safety, pt with increased time and effort Transfers Overall transfer level: Needs assistance Equipment used: Rolling walker (2 wheeled) Transfers: Sit to/from Stand Sit to Stand: Min assist General transfer comment: minA for safety and stability Ambulation/Gait Ambulation/Gait assistance: Mod assist Gait  Distance (Feet): 30 Feet Assistive device: Quad cane (in R hand) Gait Pattern/deviations: Staggering right,Decreased step length - right,Decreased stance time - left,Narrow base of support,Decreased stride length,Step-through pattern General Gait Details: Ambulates with poor motor control, staggering to R often resulting in modA to maintain balance and safety. Demonstrates decreased L stance time and thus decreased R step length. Narrow BOS. Cued pt to make step lengths symmetrical with min-no correction. Gait velocity: reduced Gait velocity interpretation: <1.8 ft/sec, indicate of risk for recurrent falls    ADL: ADL Overall ADL's : Needs assistance/impaired Eating/Feeding: Set up,Sitting Grooming: Set up,Sitting Grooming Details (indicate cue type and reason): pt with increased effort to open items,cues for sequencing, to open toothpaste (unsure if visual limitation) Upper Body Bathing: Min guard,Sitting Lower Body Bathing: Min guard,Sit to/from stand Upper Body Dressing : Min guard,Sitting Lower Body Dressing: Minimal assistance,Sit to/from stand Toilet Transfer: Minimal assistance,Ambulation,RW Toilet Transfer Details (indicate cue type and reason): minA for safety, stability and safe use of RW Functional mobility during ADLs: Minimal assistance,Rolling walker General ADL Comments: pt limited by decreased activity tolerance, cardiopulmonary limitations tachy HR with exertion: 100bpm at rest, up to 124bpm with bed mobility, 136bpm ambulation to sink, with seated rest break able to return to 110bpm;during grooming at sink HR 124bpm, up to 143bpm with return to bed  Cognition: Cognition Overall Cognitive Status: Impaired/Different from baseline Arousal/Alertness: Awake/alert Orientation Level: Oriented to person,Disoriented to time Attention: Focused Focused Attention: Appears intact Memory: Impaired Memory Impairment: Storage deficit,Retrieval deficit Awareness: Impaired Awareness  Impairment: Intellectual impairment Problem Solving: Impaired Problem Solving Impairment: Verbal basic Executive Function: Reasoning Reasoning: Impaired Reasoning Impairment: Verbal basic Safety/Judgment: Impaired Cognition Arousal/Alertness: Awake/alert Behavior During Therapy: Impulsive Overall Cognitive Status: Impaired/Different from baseline Area of Impairment: Orientation,Attention,Memory,Following commands,Safety/judgement,Awareness,Problem solving Orientation Level: Disoriented to,Place Current Attention Level: Sustained Memory: Decreased short-term memory Following Commands: Follows one step commands inconsistently,Follows one step commands with increased time Safety/Judgement: Decreased awareness of safety,Decreased awareness of deficits Awareness: Emergent Problem Solving: Slow processing,Decreased initiation,Difficulty sequencing,Requires verbal cues,Requires tactile cues General Comments: pt impulsive, pt reporting he is at a doctor's office despite orientation to hospital. Pt with decreased short term memory, did not recall seeing MD this morning, Pt able to recall name and address after 5 min;pt scored 2/28 on the Short Blessed Test to screen for cognitive status, pt's score indicates normal functioning. However, functional cognition presents with limitations. Pt would benefit from a more thorough cognition assessment. Pt with decreased awareness of deficits and decreased safety awareness;required cues for sequencing during gooming activity at sink level  Blood pressure (!) 152/81, pulse 79, temperature 97.8 F (36.6 C), temperature source Oral, resp. rate 18, height _0  (1.753 m), weight 61.2 kg, SpO2 98 %. Physical Exam (copied from H&P note due to incorrect note type) Vitals and nursing note reviewed.  Constitutional:      Appearance: Normal appearance.  HENT:     Head: Normocephalic and atraumatic.     Right Ear: External ear normal.     Left Ear: External ear  normal.     Nose: Nose normal.  Eyes:     General:  Right eye: No discharge.        Left eye: No discharge.     Extraocular Movements: Extraocular movements intact.  Cardiovascular:     Comments: Irregularly irregular Pulmonary:     Effort: Pulmonary effort is normal. No respiratory distress.     Breath sounds: No stridor.  Abdominal:     General: Abdomen is flat. Bowel sounds are normal. There is no distension.  Musculoskeletal:     Cervical back: Normal range of motion and neck supple.     Comments: No edema or tenderness in extremities  Skin:    Comments: Fragile skin Scattered ecchymosis   Neurological:     Mental Status: He is alert and oriented to person, place, and time.     Comments: Dysarthria HOH Able to answer orientation questions without difficulty. Able to follow simple motor commands and answer biographic questions without difficulty.  Motor: 5/5 throughout Decrease in fine motor movement left upper extremity Sensation intact to light touch No ataxia in bilateral upper extremities  Psychiatric:     Comments: Appears to have some impairment in cognition and decision-making   Results for orders placed or performed during the hospital encounter of 08/02/20 (from the past 24 hour(s))  Basic metabolic panel     Status: Abnormal   Collection Time: 08/04/20  4:53 AM  Result Value Ref Range   Sodium 142 135 - 145 mmol/L   Potassium 3.2 (L) 3.5 - 5.1 mmol/L   Chloride 107 98 - 111 mmol/L   CO2 23 22 - 32 mmol/L   Glucose, Bld 136 (H) 70 - 99 mg/dL   BUN 30 (H) 8 - 23 mg/dL   Creatinine, Ser 1.85 (H) 0.61 - 1.24 mg/dL   Calcium 8.8 (L) 8.9 - 10.3 mg/dL   GFR, Estimated 35 (L) >60 mL/min   Anion gap 12 5 - 15  Magnesium     Status: None   Collection Time: 08/04/20  4:53 AM  Result Value Ref Range   Magnesium 1.9 1.7 - 2.4 mg/dL   ECHOCARDIOGRAM COMPLETE  Result Date: 08/03/2020    ECHOCARDIOGRAM REPORT   Patient Name:   Chris Peterson Date of Exam:  08/03/2020 Medical Rec #:  [id number]        Height:       69.0 in Accession #:    [id number]       Weight:       135.0 lb Date of Birth:  1930-03-17        BSA:          1.748 m Patient Age:    33 years         BP:           169/96 mmHg Patient Gender: M                HR:           94 bpm. Exam Location:  Inpatient Procedure: 2D Echo, Cardiac Doppler and Color Doppler Indications:    TIA 435.9 / G45.9  History:        Patient has prior history of Echocardiogram examinations, most                 recent 05/02/2020. Arrythmias:Atrial Fibrillation,                 Signs/Symptoms:Dyspnea; Risk Factors:Hypertension and                 Dyslipidemia. Abdominal Aortic Aneurysm. CKD.  Pericardial                 effusion.  Sonographer:    Jonelle Sidle Dance Referring Phys: [id number] [place]  1. Left ventricular ejection fraction, by estimation, is >75%. The left ventricle has hyperdynamic function. The left ventricle has no regional wall motion abnormalities. There is mild left ventricular hypertrophy. Left ventricular diastolic function could not be evaluated.  2. Right ventricular systolic function is normal. The right ventricular size is normal. Tricuspid regurgitation signal is inadequate for assessing PA pressure.  3. Left atrial size was severely dilated.  4. Right atrial size was mildly dilated.  5. The mitral valve is normal in structure. Mild mitral valve regurgitation. No evidence of mitral stenosis.  6. The aortic valve is calcified. Aortic valve regurgitation is mild. Mild to moderate aortic valve sclerosis/calcification is present, without any evidence of aortic stenosis.  7. The inferior vena cava is normal in size with greater than 50% respiratory variability, suggesting right atrial pressure of 3 mmHg. FINDINGS  Left Ventricle: Left ventricular ejection fraction, by estimation, is >75%. The left ventricle has hyperdynamic function. The left ventricle has no regional wall motion abnormalities. The  left ventricular internal cavity size was normal in size. There is mild left ventricular hypertrophy. Left ventricular diastolic function could not be evaluated due to atrial fibrillation. Left ventricular diastolic function could not be evaluated. Right Ventricle: The right ventricular size is normal.Right ventricular systolic function is normal. Tricuspid regurgitation signal is inadequate for assessing PA pressure. The tricuspid regurgitant velocity is 2.38 m/s, and with an assumed right atrial pressure of 3 mmHg, the estimated right ventricular systolic pressure is 82.6 mmHg. Left Atrium: Left atrial size was severely dilated. Right Atrium: Right atrial size was mildly dilated. Pericardium: Trivial pericardial effusion is present. Mitral Valve: The mitral valve is normal in structure. Mild mitral annular calcification. Mild mitral valve regurgitation. No evidence of mitral valve stenosis. Tricuspid Valve: The tricuspid valve is normal in structure. Tricuspid valve regurgitation is mild . No evidence of tricuspid stenosis. Aortic Valve: The aortic valve is calcified. Aortic valve regurgitation is mild. Aortic regurgitation PHT measures 715 msec. Mild to moderate aortic valve sclerosis/calcification is present, without any evidence of aortic stenosis. Aortic valve mean gradient measures 7.5 mmHg. Aortic valve peak gradient measures 15.6 mmHg. Aortic valve area, by VTI measures 1.98 cm. Pulmonic Valve: The pulmonic valve was not well visualized. Pulmonic valve regurgitation is not visualized. No evidence of pulmonic stenosis. Aorta: The aortic root is normal in size and structure. Venous: The inferior vena cava is normal in size with greater than 50% respiratory variability, suggesting right atrial pressure of 3 mmHg.  LEFT VENTRICLE PLAX 2D LVIDd:         3.45 cm LVIDs:         2.85 cm LV PW:         1.75 cm LV IVS:        1.20 cm LVOT diam:     2.10 cm LV SV:         70 LV SV Index:   40 LVOT Area:     3.46 cm   RIGHT VENTRICLE          IVC RV Basal diam:  3.47 cm  IVC diam: 1.60 cm RV Mid diam:    2.53 cm TAPSE (M-mode): 1.5 cm LEFT ATRIUM              Index  RIGHT ATRIUM           Index LA diam:        4.80 cm  2.75 cm/m  RA Area:     23.00 cm LA Vol (A2C):   105.0 ml 60.06 ml/m RA Volume:   62.70 ml  35.87 ml/m LA Vol (A4C):   75.4 ml  43.13 ml/m LA Biplane Vol: 92.8 ml  53.08 ml/m  AORTIC VALVE AV Area (Vmax):    2.11 cm AV Area (Vmean):   2.24 cm AV Area (VTI):     1.98 cm AV Vmax:           197.50 cm/s AV Vmean:          125.000 cm/s AV VTI:            0.355 m AV Peak Grad:      15.6 mmHg AV Mean Grad:      7.5 mmHg LVOT Vmax:         120.50 cm/s LVOT Vmean:        80.750 cm/s LVOT VTI:          0.203 m LVOT/AV VTI ratio: 0.57 AI PHT:            715 msec  AORTA Ao Root diam: 3.80 cm Ao Asc diam:  3.60 cm MITRAL VALVE                TRICUSPID VALVE MV Area (PHT): 3.99 cm     TR Peak grad:   22.7 mmHg MV Decel Time: 190 msec     TR Vmax:        238.00 cm/s MV E velocity: 155.00 cm/s MV A velocity: 56.20 cm/s   SHUNTS MV E/A ratio:  2.76         Systemic VTI:  0.20 m                             Systemic Diam: 2.10 cm Kirk Ruths MD Electronically signed by Kirk Ruths MD Signature Date/Time: 08/03/2020/12:27:01 PM    Final     Assessment/Plan: Diagnosis: right PCA infarct  Stroke: Continue secondary stroke prophylaxis and Risk Factor Modification listed below:   Antiplatelet therapy:   Blood Pressure Management:  Continue current medication with prn's with permisive HTN per primary team Statin Agent:   Tobacco abuse:   Labs independently reviewed.  Records reviewed and summated above.  1. Does the need for close, 24 hr/day medical supervision in concert with the patient's rehab needs make it unreasonable for this patient to be served in a less intensive setting? Potentially  2. Co-Morbidities requiring supervision/potential complications: PAF (monitor with increased exertion, off  anticoagulation due to GI bleed), AAA, CKD IIIb (avoid nephrotoxic meds, repeat labs), HTN (monitor and provide prns in accordance with increased physical exertion and pain), ongoing alcohol abuse (counsel), anemia (repeat labs, consider transfusion if necessary to ensure appropriate perfusion for increased activity tolerance)  3. Due to safety, disease management, medication administration and patient education, does the patient require 24 hr/day rehab nursing? Potentially 4. Does the patient require coordinated care of a physician, rehab nurse, PT (1-2 hrs/day, 5 days/week), OT (1-2 hrs/day, 5 days/week) and SLP (1-2 hrs/day, 5 days/week) to address physical and functional deficits in the context of the above medical diagnosis(es)? Potentially Addressing deficits in the following areas: balance, endurance, locomotion, strength, transferring, bathing, dressing, toileting and psychosocial support 5. Can the patient actively  participate in an intensive therapy program of at least 3 hrs of therapy per day at least 5 days per week? Yes 6. The potential for patient to make measurable gains while on inpatient rehab is excellent 7. Anticipated functional outcomes upon discharge from inpatient rehab are supervision  with PT, supervision with OT, modified independent and supervision with SLP. 8. Estimated rehab length of stay to reach the above functional goals is: 14-18 days. 9. Anticipated D/C setting: Other 10. Anticipated post D/C treatments: HH therapy and Home excercise program 11. Overall Rehab/Functional Prognosis: good  RECOMMENDATIONS: This patient's condition is appropriate for continued rehabilitative care in the following setting: Patient's daughters state that patient is not safe at home and is unable to be cared for and he needs to be in a facility.  Patient will likely need at least supervision upon discharge and as such recommend SNF. Patient has agreed to participate in recommended program.  Potentially Note that insurance prior authorization may be required for reimbursement for recommended care.  Comment: Rehab Admissions Coordinator to follow up.  Bary Leriche, PA-C Delice Lesch, MD, ABPMR 08/04/2020

## 2020-08-04 NOTE — Progress Notes (Signed)
RN notified by CCMD HR 35 for short period then sustained HR 51-60 . RN notified MD, PT. Was asymptomatic and resting in bed, with respirations even and unlabored. Pt. Later sustained HR 100-108 and returned afib with normal HR ranges. PT. Was in AFIB most of the night.

## 2020-08-04 NOTE — H&P (Deleted)
Physical Medicine and Rehabilitation consult    Chief Complaint  Patient presents with  . Functional deficits due to stroke    HPI: Chris Peterson is an 86 year old male with history of PAF- off AC due to GIB, AAA, CKD IIIb, HTN, ongoing alcohol abuse per daughter, anemia; who was admitted on 08/02/2020 with visual disturbance, dizziness, dysarthria, and confusion.  History taken from chart review, patient, and daughter.  Vision has improved, but he was found to have mild left sided weakness. MRI/MRA brain done revealing moderate sized right PCA infarct affecting temporo-occipital lobes, occlusion of right P2 PCA, small cortical infarcts in left frontal and left parieto-occipital junction.  Echocardiogram with ejection fraction >75% with hyperdynamic function, severe LA dilatation and mild to moderate AS.  Dr. Erlinda Hong felt that stroke was embolic due to A. fib and lack of anticoagulation.  Case was discussed with Dr. Trula Slade and patient cleared to start anticoagulation 5 days post stroke.  Therapy evaluations completed today revealing right hemiparesis with dysmetria, poor motor control, loss of balance as well as heart rates up to 140s with activity, cognitive deficits with 15/30 per SLUMS. Patient with functional decline and CIR recommended for follow-up therapy.  Review of Systems  Constitutional: Negative for chills, fever and malaise/fatigue.  HENT: Positive for hearing loss.   Eyes: Negative for blurred vision and double vision.  Respiratory: Positive for shortness of breath ("walking to the mailbox"). Negative for cough.   Cardiovascular: Negative for chest pain and palpitations.  Gastrointestinal: Positive for heartburn. Negative for diarrhea and nausea.  Musculoskeletal: Positive for back pain and falls (recently). Negative for joint pain and myalgias.  Skin: Negative for rash.  Neurological: Negative for dizziness, sensory change, speech change, focal weakness, weakness and headaches.   Psychiatric/Behavioral: The patient is not nervous/anxious.   All other systems reviewed and are negative.   Past Medical History:  Diagnosis Date  . AAA (abdominal aortic aneurysm) ([place])   . Atherosclerotic renal artery stenosis, bilateral (HCC)    Bilateral renal angiography 11/27/11, Right 70% prox, Left 90% prox s/p LRA PTA/Stenting - 6.0x60mm ;  12/12/2011 Renal Duplex: Patent LRA stent & > 60% RRA ost stenosis  . Atrial fibrillation ([place]) 11/19/2019  . Blood transfusion    S/P "lost blood due to hemorrhoids"  . Complication of anesthesia 2013   Severe Diarrhea aftter renal artery  . Dyspnea    with effusion  . Dysrhythmia    "fast one time"  . Hemorrhoids   . High cholesterol   . HTN (hypertension)   . Kidney disease, chronic, stage III (moderate, EGFR 30-59 ml/min) (H[facility])    sees [facility]  . Pericardial effusion    s/p pericardiocentesis 11/2019 // reaccumulated >> s/p window 12/2019 // Echo 9/21: EF 60-65, no RWMA, moderate LVH, normal RVSF, severe BAE, small effusion posterior to the left ventricle (no evidence of tamponade), mild-moderate MR  . Pre-diabetes   . Sciatica     Past Surgical History:  Procedure Laterality Date  . ABDOMINAL ANGIOGRAM N/A 11/27/2011   Procedure: ABDOMINAL ANGIOGRAM;  Surgeon: Sherren Mocha, MD;  Location: [facility] CATH LAB;  Service: Cardiovascular;  Laterality: N/A;  . BIOPSY  03/13/2018   Procedure: BIOPSY;  Surgeon: Ladene Artist, MD;  Location: [facility] ENDOSCOPY;  Service: Endoscopy;;  . CATARACT EXTRACTION W/ INTRAOCULAR LENS  IMPLANT, BILATERAL  1990's  . ESOPHAGOGASTRODUODENOSCOPY N/A 03/13/2018   Procedure: ESOPHAGOGASTRODUODENOSCOPY (EGD);  Surgeon: Ladene Artist, MD;  Location: [facility] ENDOSCOPY;  Service: Endoscopy;  Laterality: N/A;  . HOT HEMOSTASIS N/A 03/13/2018   Procedure: HOT HEMOSTASIS (ARGON PLASMA COAGULATION/BICAP);  Surgeon: Meryl Dare, MD;  Location: [facility] ENDOSCOPY;  Service: Endoscopy;  Laterality: N/A;  . NASAL SEPTUM  SURGERY  1980's  . PERICARDIOCENTESIS N/A 10/27/2019   Procedure: PERICARDIOCENTESIS;  Surgeon: Tonny Bollman, MD;  Location: [facility] INVASIVE CV LAB;  Service: Cardiovascular;  Laterality: N/A;  . RENAL ANGIOGRAM Bilateral 11/27/2011   Procedure: RENAL ANGIOGRAM;  Surgeon: Tonny Bollman, MD;  Location: [facility] CATH LAB;  Service: Cardiovascular;  Laterality: Bilateral;  possible PTA/stent  . RENAL ARTERY STENT  11/27/11  . RIGHT HEART CATH N/A 10/27/2019   Procedure: RIGHT HEART CATH;  Surgeon: Tonny Bollman, MD;  Location: [facility] INVASIVE CV LAB;  Service: Cardiovascular;  Laterality: N/A;  . SUBXYPHOID PERICARDIAL WINDOW N/A 12/09/2019   Procedure: SUBXYPHOID PERICARDIAL WINDOW;  Surgeon: Kerin Perna, MD;  Location: [facility] OR;  Service: Thoracic;  Laterality: N/A;  . TEE WITHOUT CARDIOVERSION N/A 12/09/2019   Procedure: TRANSESOPHAGEAL ECHOCARDIOGRAM (TEE);  Surgeon: Donata Clay, Theron Arista, MD;  Location: [facility] OR;  Service: Thoracic;  Laterality: N/A;    Family History  Problem Relation Age of Onset  . Hypertension Mother   . Hypertension Brother     Social History: Married and independent PTA. 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 His daughters checks in and helps with home management.  Wife uses RW for mobility. He reports that he has been smoking pipe 3 times a day. and reports smoking cigarettes in the past. He has smoked for the past 65.00 years. He has never used smokeless tobacco. He reports current alcohol use of about --2 shots of liquor daily. He reports that he does not use drugs.    Allergies: No Known Allergies    Medications Prior to Admission  Medication Sig Dispense Refill  . carvedilol (COREG) 12.5 MG tablet Take 12.5 mg by mouth 2 (two) times daily.    . cholecalciferol (VITAMIN D) 25 MCG (1000 UNIT) tablet Take 1 tablet (1,000 Units total) by mouth daily.    . furosemide (LASIX) 40 MG tablet Take 40 mg by mouth daily.    [name] lisinopril (ZESTRIL) 10 MG tablet Take 10 mg by  mouth daily.    . rosuvastatin (CRESTOR) 40 MG tablet Take 40 mg by mouth daily.    . tamsulosin (FLOMAX) 0.4 MG CAPS capsule Take 0.4 mg by mouth daily.     Home: Home Living Family/patient expects to be discharged to:: Private residence Living Arrangements: Spouse/significant other Available Help at Discharge: Family,Available 24 hours/day Type of Home: House Home Access: Stairs to enter [employer] of Steps: 2 Entrance Stairs-Rails: Right (ascending) Home Layout: Two level,Bed/bath upstairs,1/2 bath on main level (bedroom on main floor if needed; stair lift available but he does not like to use it) Alternate Level Stairs-Number of Steps: 10 Alternate Level Stairs-Rails: Right (ascending) Bathroom Shower/Tub: Health visitor: Standard Bathroom Accessibility: No Home Equipment: Environmental consultant - 4 wheels,Cane - quad,Walker - 2 wheels,Bedside commode,Tub bench,Hand held shower head,Wheelchair - manual (bath transfer chair) Additional Comments: Pt's daughter pulled PT aside and reported pt is an alcholic and drinks nightly and stated that the pt even told a MD he would not stop drinking. Pt's daughter willing to consider long-term placement for pt safety.  Lives With: Spouse   Functional History: Prior Function Level of Independence: Independent with assistive device(s) Comments: Daughters and wife do cleaning and cooking, but pt otherwise mod I with  use of quad-cane for all mobility. Pt does not provide physical assistance to wife who is 29 and has also had previous strokes.  Functional Status:  Mobility: Bed Mobility Overal bed mobility: Needs Assistance Bed Mobility: Supine to Sit Supine to sit: Min assist General bed mobility comments: Bed flat, pt able to transition legs off EOB but displayed difficulty ascending trunk without use of bed rails, thus minA to ascend trunk. Transfers Overall transfer level: Needs assistance Transfers: Sit to/from Stand Sit to  Stand: Min assist General transfer comment: Pt quick to push up to stand, but displayed trunk sway and requires minA for safety. Ambulation/Gait Ambulation/Gait assistance: Mod assist Gait Distance (Feet): 30 Feet Assistive device: Quad cane (in R hand) Gait Pattern/deviations: Staggering right,Decreased step length - right,Decreased stance time - left,Narrow base of support,Decreased stride length,Step-through pattern General Gait Details: Ambulates with poor motor control, staggering to R often resulting in modA to maintain balance and safety. Demonstrates decreased L stance time and thus decreased R step length. Narrow BOS. Cued pt to make step lengths symmetrical with min-no correction. Gait velocity: reduced Gait velocity interpretation: <1.8 ft/sec, indicate of risk for recurrent falls    ADL:    Cognition: Cognition Overall Cognitive Status: Impaired/Different from baseline Orientation Level: Oriented to person Cognition Arousal/Alertness: Awake/alert Behavior During Therapy: Impulsive Overall Cognitive Status: Impaired/Different from baseline Area of Impairment: Orientation,Attention,Memory,Following commands,Safety/judgement,Awareness,Problem solving Orientation Level: Disoriented to,Place Current Attention Level: Sustained Memory: Decreased short-term memory Following Commands: Follows one step commands inconsistently,Follows one step commands with increased time Safety/Judgement: Decreased awareness of safety,Decreased awareness of deficits Awareness: Emergent Problem Solving: Slow processing,Decreased initiation,Difficulty sequencing,Requires verbal cues,Requires tactile cues General Comments: A&Ox4 with pt initially stating he was at home but then corrected and said he was at hospital. Pt's daughter reports he is confused and continues to try to get up OOB because he has been thinking he is at home and is trying to stay on his normal schedule of getting up in morning to  get shower. Pt impulsive and requires repeated cues to maintain safety. Pt unaware of deficits and displays minimal corrections with functional mob to stay safe.   Blood pressure (!) 152/81, pulse 79, temperature 97.8 F (36.6 C), temperature source Oral, resp. rate 18, height $RemoveBe'5\' 9"'nOqdGHYHc$  (1.753 m), weight 61.2 kg, SpO2 98 %. Physical Exam Vitals and nursing note reviewed.  Constitutional:      Appearance: Normal appearance.  HENT:     Head: Normocephalic and atraumatic.     Right Ear: External ear normal.     Left Ear: External ear normal.     Nose: Nose normal.  Eyes:     General:        Right eye: No discharge.        Left eye: No discharge.     Extraocular Movements: Extraocular movements intact.  Cardiovascular:     Comments: Irregularly irregular Pulmonary:     Effort: Pulmonary effort is normal. No respiratory distress.     Breath sounds: No stridor.  Abdominal:     General: Abdomen is flat. Bowel sounds are normal. There is no distension.  Musculoskeletal:     Cervical back: Normal range of motion and neck supple.     Comments: No edema or tenderness in extremities  Skin:    Comments: Fragile skin Scattered ecchymosis   Neurological:     Mental Status: He is alert and oriented to person, place, and time.     Comments: Dysarthria HOH Able to  answer orientation questions without difficulty. Able to follow simple motor commands and answer biographic questions without difficulty.  Motor: 5/5 throughout Decrease in fine motor movement left upper extremity Sensation intact to light touch No ataxia in bilateral upper extremities  Psychiatric:     Comments: Appears to have some impairment in cognition and decision-making     Results for orders placed or performed during the hospital encounter of 08/02/20 (from the past 48 hour(s))  TSH     Status: None   Collection Time: 08/03/20  4:14 AM  Result Value Ref Range   TSH 1.752 0.350 - 4.500 uIU/mL    Comment: Performed by a  3rd Generation assay with a functional sensitivity of <=0.01 uIU/mL. Performed at [facility], [place] [address], [place], [place] [address]   RPR     Status: None   Collection Time: 08/03/20  4:14 AM  Result Value Ref Range   RPR Ser Ql NON REACTIVE NON REACTIVE    Comment: Performed at [facility] [address], [place], [place] [address]  Hemoglobin A1c     Status: Abnormal   Collection Time: 08/03/20  4:15 AM  Result Value Ref Range   Hgb A1c MFr Bld 6.6 (H) 4.8 - 5.6 %    Comment: (NOTE) Pre diabetes:          5.7%-6.4%  Diabetes:              >6.4%  Glycemic control for   <7.0% adults with diabetes    Mean Plasma Glucose 142.72 mg/dL    Comment: Performed at [facility] [address], [place],  [address]  Lipid panel     Status: None   Collection Time: 08/03/20  4:15 AM  Result Value Ref Range   Cholesterol 113 0 - 200 mg/dL   Triglycerides 106 <150 mg/dL   HDL 49 >40 mg/dL   Total CHOL/HDL Ratio 2.3 RATIO   VLDL 21 0 - 40 mg/dL   LDL Cholesterol 43 0 - 99 mg/dL    Comment:        Total Cholesterol/HDL:CHD Risk Coronary Heart Disease Risk Table                     Men   Women  1/2 Average Risk   3.4   3.3  Average Risk       5.0   4.4  2 X Average Risk   9.6   7.1  3 X Average Risk  23.4   11.0        Use the calculated Patient Ratio above and the CHD Risk Table to determine the patient's CHD Risk.        ATP III CLASSIFICATION (LDL):  <100     mg/dL   Optimal  100-129  mg/dL   Near or Above                    Optimal  130-159  mg/dL   Borderline  160-189  mg/dL   High  >190     mg/dL   Very High Performed at [facility] [address], [place] [address]   CBC     Status: Abnormal   Collection Time: 08/03/20  9:07 AM  Result Value Ref Range   WBC 6.1 4.0 - 10.5 K/uL   RBC 4.26 4.22 - 5.81 MIL/uL   Hemoglobin 11.8 (L) 13.0 - 17.0 g/dL  HCT 37.7 (L) 39.0 - 52.0 %   MCV 88.5 80.0 - 100.0 fL   MCH 27.7 26.0  - 34.0 pg   MCHC 31.3 30.0 - 36.0 g/dL   RDW 16.7 (H) 11.5 - 15.5 %   Platelets 144 (L) 150 - 400 K/uL   nRBC 0.0 0.0 - 0.2 %    Comment: Performed at [facility] [address] [place], [place] [address]  Comprehensive metabolic panel     Status: Abnormal   Collection Time: 08/03/20  9:07 AM  Result Value Ref Range   Sodium 145 135 - 145 mmol/L   Potassium 3.7 3.5 - 5.1 mmol/L    Comment: SLIGHT HEMOLYSIS   Chloride 107 98 - 111 mmol/L   CO2 19 (L) 22 - 32 mmol/L   Glucose, Bld 103 (H) 70 - 99 mg/dL    Comment: Glucose reference range applies only to samples taken after fasting for at least 8 hours.   BUN 34 (H) 8 - 23 mg/dL   Creatinine, Ser 1.74 (H) 0.61 - 1.24 mg/dL   Calcium 8.9 8.9 - 10.3 mg/dL   Total Protein 6.0 (L) 6.5 - 8.1 g/dL   Albumin 3.1 (L) 3.5 - 5.0 g/dL   AST 28 15 - 41 U/L   ALT 12 0 - 44 U/L   Alkaline Phosphatase 45 38 - 126 U/L   Total Bilirubin 1.3 (H) 0.3 - 1.2 mg/dL   GFR, Estimated 37 (L) >60 mL/min    Comment: (NOTE) Calculated using the CKD-EPI Creatinine Equation (2021)    Anion gap 19 (H) 5 - 15    Comment: Performed at [facility], [place] [address], [place], [place] [address]  CK     Status: None   Collection Time: 08/03/20  9:07 AM  Result Value Ref Range   Total CK 115 49 - 397 U/L    Comment: Performed at [facility], [place] [address], [place], [place] [address]  Basic metabolic panel     Status: Abnormal   Collection Time: 08/04/20  4:53 AM  Result Value Ref Range   Sodium 142 135 - 145 mmol/L   Potassium 3.2 (L) 3.5 - 5.1 mmol/L   Chloride 107 98 - 111 mmol/L   CO2 23 22 - 32 mmol/L   Glucose, Bld 136 (H) 70 - 99 mg/dL    Comment: Glucose reference range applies only to samples taken after fasting for at least 8 hours.   BUN 30 (H) 8 - 23 mg/dL   Creatinine, Ser 1.85 (H) 0.61 - 1.24 mg/dL   Calcium 8.8 (L) 8.9 - 10.3 mg/dL   GFR, Estimated 35 (L) >60 mL/min    Comment: (NOTE) Calculated using the CKD-EPI  Creatinine Equation (2021)    Anion gap 12 5 - 15    Comment: Performed at [facility] [address] [place], [place] [address]  Magnesium     Status: None   Collection Time: 08/04/20  4:53 AM  Result Value Ref Range   Magnesium 1.9 1.7 - 2.4 mg/dL    Comment: Performed at [facility] [address] [place], [place] [address]   MR ANGIO HEAD WO CONTRAST  Result Date: 08/02/2020 CLINICAL DATA:  Stroke EXAM: MRI HEAD WITHOUT CONTRAST MRA HEAD WITHOUT CONTRAST MRA NECK WITHOUT AND WITH CONTRAST TECHNIQUE: Multiplanar, multiecho pulse sequences of the brain and surrounding structures were obtained without intravenous contrast. Angiographic images of the Circle of Willis  were obtained using MRA technique without intravenous contrast. Angiographic images of the neck were obtained using MRA technique without and with intravenous contrast. Carotid stenosis measurements (when applicable) are obtained utilizing NASCET criteria, using the distal internal carotid diameter as the denominator. CONTRAST:  15mL GADAVIST GADOBUTROL 1 MMOL/ML IV SOLN COMPARISON:  Correlation made with same day CT FINDINGS: MRI HEAD Brain: There is restricted diffusion involving the medial right temporal lobe with extension into the occipital lobe. Additional small foci of cortical restricted diffusion contralaterally at the left parietooccipital junction, left frontal lobe, and left cerebellum. Small focus of susceptibility in the right cerebellum is most consistent with chronic microhemorrhage. Additional patchy T2 hyperintensity in the supratentorial white matter is nonspecific but probably reflects mild chronic microvascular ischemic changes. Prominence of the ventricles and sulci reflects generalized parenchymal volume loss. There is no intracranial mass or significant mass effect. There is no hydrocephalus or extra-axial fluid collection. Vascular: Susceptibility along the right perimesencephalic cistern likely  reflects thrombosis within the right P2 PCA. Major vessel flow voids at the skull base are preserved. Skull and upper cervical spine: Normal marrow signal is preserved. Sinuses/Orbits: Mild mucosal thickening. No significant orbital abnormality. Other: Sella is unremarkable. Mild patchy mastoid fluid opacification. MRA HEAD Intracranial internal carotid arteries are patent. Middle and anterior cerebral arteries are patent. Atherosclerotic irregularity of distal left MCA branches. Included intracranial vertebral arteries are patent. Patent PICA origins. Basilar artery is patent. There is diminished flow within the right P1 PCA. Possible diminutive right posterior communicating artery. Occlusion of the right P2 PCA. Left posterior cerebral artery is patent. MRA NECK Common, internal, and external carotid arteries are patent. There is plaque at the ICA origins bilaterally causing minimal stenosis. Extracranial vertebral arteries are patent. Left vertebral artery is slightly dominant. No measurable stenosis or evidence of dissection. IMPRESSION: Moderate size right PCA territory acute infarction involving temporo-occipital lobes. Occlusion of the right P2 PCA. Small acute cortical infarcts of the left frontal lobe and left parieto-occipital junction. Atherosclerotic irregularity of distal left MCA branches. Atherosclerotic plaque at the ICA origins with minimal stenosis. Electronically Signed   By: Guadlupe Spanish M.D.   On: 08/02/2020 15:53   MR ANGIO NECK W WO CONTRAST  Result Date: 08/02/2020 CLINICAL DATA:  Stroke EXAM: MRI HEAD WITHOUT CONTRAST MRA HEAD WITHOUT CONTRAST MRA NECK WITHOUT AND WITH CONTRAST TECHNIQUE: Multiplanar, multiecho pulse sequences of the brain and surrounding structures were obtained without intravenous contrast. Angiographic images of the Circle of Willis were obtained using MRA technique without intravenous contrast. Angiographic images of the neck were obtained using MRA technique  without and with intravenous contrast. Carotid stenosis measurements (when applicable) are obtained utilizing NASCET criteria, using the distal internal carotid diameter as the denominator. CONTRAST:  91mL GADAVIST GADOBUTROL 1 MMOL/ML IV SOLN COMPARISON:  Correlation made with same day CT FINDINGS: MRI HEAD Brain: There is restricted diffusion involving the medial right temporal lobe with extension into the occipital lobe. Additional small foci of cortical restricted diffusion contralaterally at the left parietooccipital junction, left frontal lobe, and left cerebellum. Small focus of susceptibility in the right cerebellum is most consistent with chronic microhemorrhage. Additional patchy T2 hyperintensity in the supratentorial white matter is nonspecific but probably reflects mild chronic microvascular ischemic changes. Prominence of the ventricles and sulci reflects generalized parenchymal volume loss. There is no intracranial mass or significant mass effect. There is no hydrocephalus or extra-axial fluid collection. Vascular: Susceptibility along the right perimesencephalic cistern likely reflects thrombosis within the right  P2 PCA. Major vessel flow voids at the skull base are preserved. Skull and upper cervical spine: Normal marrow signal is preserved. Sinuses/Orbits: Mild mucosal thickening. No significant orbital abnormality. Other: Sella is unremarkable. Mild patchy mastoid fluid opacification. MRA HEAD Intracranial internal carotid arteries are patent. Middle and anterior cerebral arteries are patent. Atherosclerotic irregularity of distal left MCA branches. Included intracranial vertebral arteries are patent. Patent PICA origins. Basilar artery is patent. There is diminished flow within the right P1 PCA. Possible diminutive right posterior communicating artery. Occlusion of the right P2 PCA. Left posterior cerebral artery is patent. MRA NECK Common, internal, and external carotid arteries are patent. There  is plaque at the ICA origins bilaterally causing minimal stenosis. Extracranial vertebral arteries are patent. Left vertebral artery is slightly dominant. No measurable stenosis or evidence of dissection. IMPRESSION: Moderate size right PCA territory acute infarction involving temporo-occipital lobes. Occlusion of the right P2 PCA. Small acute cortical infarcts of the left frontal lobe and left parieto-occipital junction. Atherosclerotic irregularity of distal left MCA branches. Atherosclerotic plaque at the ICA origins with minimal stenosis. Electronically Signed   By: Macy Mis M.D.   On: 08/02/2020 15:53   MR BRAIN WO CONTRAST  Result Date: 08/02/2020 CLINICAL DATA:  Stroke EXAM: MRI HEAD WITHOUT CONTRAST MRA HEAD WITHOUT CONTRAST MRA NECK WITHOUT AND WITH CONTRAST TECHNIQUE: Multiplanar, multiecho pulse sequences of the brain and surrounding structures were obtained without intravenous contrast. Angiographic images of the Circle of Willis were obtained using MRA technique without intravenous contrast. Angiographic images of the neck were obtained using MRA technique without and with intravenous contrast. Carotid stenosis measurements (when applicable) are obtained utilizing NASCET criteria, using the distal internal carotid diameter as the denominator. CONTRAST:  34mL GADAVIST GADOBUTROL 1 MMOL/ML IV SOLN COMPARISON:  Correlation made with same day CT FINDINGS: MRI HEAD Brain: There is restricted diffusion involving the medial right temporal lobe with extension into the occipital lobe. Additional small foci of cortical restricted diffusion contralaterally at the left parietooccipital junction, left frontal lobe, and left cerebellum. Small focus of susceptibility in the right cerebellum is most consistent with chronic microhemorrhage. Additional patchy T2 hyperintensity in the supratentorial white matter is nonspecific but probably reflects mild chronic microvascular ischemic changes. Prominence of the  ventricles and sulci reflects generalized parenchymal volume loss. There is no intracranial mass or significant mass effect. There is no hydrocephalus or extra-axial fluid collection. Vascular: Susceptibility along the right perimesencephalic cistern likely reflects thrombosis within the right P2 PCA. Major vessel flow voids at the skull base are preserved. Skull and upper cervical spine: Normal marrow signal is preserved. Sinuses/Orbits: Mild mucosal thickening. No significant orbital abnormality. Other: Sella is unremarkable. Mild patchy mastoid fluid opacification. MRA HEAD Intracranial internal carotid arteries are patent. Middle and anterior cerebral arteries are patent. Atherosclerotic irregularity of distal left MCA branches. Included intracranial vertebral arteries are patent. Patent PICA origins. Basilar artery is patent. There is diminished flow within the right P1 PCA. Possible diminutive right posterior communicating artery. Occlusion of the right P2 PCA. Left posterior cerebral artery is patent. MRA NECK Common, internal, and external carotid arteries are patent. There is plaque at the ICA origins bilaterally causing minimal stenosis. Extracranial vertebral arteries are patent. Left vertebral artery is slightly dominant. No measurable stenosis or evidence of dissection. IMPRESSION: Moderate size right PCA territory acute infarction involving temporo-occipital lobes. Occlusion of the right P2 PCA. Small acute cortical infarcts of the left frontal lobe and left parieto-occipital junction. Atherosclerotic irregularity of distal  left MCA branches. Atherosclerotic plaque at the ICA origins with minimal stenosis. Electronically Signed   By: Guadlupe Spanish M.D.   On: 08/02/2020 15:53   ECHOCARDIOGRAM COMPLETE  Result Date: 08/03/2020    ECHOCARDIOGRAM REPORT   Patient Name:   JACQUELYN ANTONY Date of Exam: 08/03/2020 Medical Rec #:  [id number]        Height:       69.0 in Accession #:    [id number]        Weight:       135.0 lb Date of Birth:  1929/11/22        BSA:          1.748 m Patient Age:    89 years         BP:           169/96 mmHg Patient Gender: M                HR:           94 bpm. Exam Location:  Inpatient Procedure: 2D Echo, Cardiac Doppler and Color Doppler Indications:    TIA 435.9 / G45.9  History:        Patient has prior history of Echocardiogram examinations, most                 recent 05/02/2020. Arrythmias:Atrial Fibrillation,                 Signs/Symptoms:Dyspnea; Risk Factors:Hypertension and                 Dyslipidemia. Abdominal Aortic Aneurysm. CKD. Pericardial                 effusion.  Sonographer:    Elmarie Shiley Dance Referring Phys: [id number] Emeline General IMPRESSIONS  1. Left ventricular ejection fraction, by estimation, is >75%. The left ventricle has hyperdynamic function. The left ventricle has no regional wall motion abnormalities. There is mild left ventricular hypertrophy. Left ventricular diastolic function could not be evaluated.  2. Right ventricular systolic function is normal. The right ventricular size is normal. Tricuspid regurgitation signal is inadequate for assessing PA pressure.  3. Left atrial size was severely dilated.  4. Right atrial size was mildly dilated.  5. The mitral valve is normal in structure. Mild mitral valve regurgitation. No evidence of mitral stenosis.  6. The aortic valve is calcified. Aortic valve regurgitation is mild. Mild to moderate aortic valve sclerosis/calcification is present, without any evidence of aortic stenosis.  7. The inferior vena cava is normal in size with greater than 50% respiratory variability, suggesting right atrial pressure of 3 mmHg. FINDINGS  Left Ventricle: Left ventricular ejection fraction, by estimation, is >75%. The left ventricle has hyperdynamic function. The left ventricle has no regional wall motion abnormalities. The left ventricular internal cavity size was normal in size. There is mild left ventricular hypertrophy.  Left ventricular diastolic function could not be evaluated due to atrial fibrillation. Left ventricular diastolic function could not be evaluated. Right Ventricle: The right ventricular size is normal.Right ventricular systolic function is normal. Tricuspid regurgitation signal is inadequate for assessing PA pressure. The tricuspid regurgitant velocity is 2.38 m/s, and with an assumed right atrial pressure of 3 mmHg, the estimated right ventricular systolic pressure is 25.7 mmHg. Left Atrium: Left atrial size was severely dilated. Right Atrium: Right atrial size was mildly dilated. Pericardium: Trivial pericardial effusion is present. Mitral Valve: The mitral valve is normal in structure. Mild mitral annular calcification.  Mild mitral valve regurgitation. No evidence of mitral valve stenosis. Tricuspid Valve: The tricuspid valve is normal in structure. Tricuspid valve regurgitation is mild . No evidence of tricuspid stenosis. Aortic Valve: The aortic valve is calcified. Aortic valve regurgitation is mild. Aortic regurgitation PHT measures 715 msec. Mild to moderate aortic valve sclerosis/calcification is present, without any evidence of aortic stenosis. Aortic valve mean gradient measures 7.5 mmHg. Aortic valve peak gradient measures 15.6 mmHg. Aortic valve area, by VTI measures 1.98 cm. Pulmonic Valve: The pulmonic valve was not well visualized. Pulmonic valve regurgitation is not visualized. No evidence of pulmonic stenosis. Aorta: The aortic root is normal in size and structure. Venous: The inferior vena cava is normal in size with greater than 50% respiratory variability, suggesting right atrial pressure of 3 mmHg.  LEFT VENTRICLE PLAX 2D LVIDd:         3.45 cm LVIDs:         2.85 cm LV PW:         1.75 cm LV IVS:        1.20 cm LVOT diam:     2.10 cm LV SV:         70 LV SV Index:   40 LVOT Area:     3.46 cm  RIGHT VENTRICLE          IVC RV Basal diam:  3.47 cm  IVC diam: 1.60 cm RV Mid diam:    2.53 cm TAPSE  (M-mode): 1.5 cm LEFT ATRIUM              Index       RIGHT ATRIUM           Index LA diam:        4.80 cm  2.75 cm/m  RA Area:     23.00 cm LA Vol (A2C):   105.0 ml 60.06 ml/m RA Volume:   62.70 ml  35.87 ml/m LA Vol (A4C):   75.4 ml  43.13 ml/m LA Biplane Vol: 92.8 ml  53.08 ml/m  AORTIC VALVE AV Area (Vmax):    2.11 cm AV Area (Vmean):   2.24 cm AV Area (VTI):     1.98 cm AV Vmax:           197.50 cm/s AV Vmean:          125.000 cm/s AV VTI:            0.355 m AV Peak Grad:      15.6 mmHg AV Mean Grad:      7.5 mmHg LVOT Vmax:         120.50 cm/s LVOT Vmean:        80.750 cm/s LVOT VTI:          0.203 m LVOT/AV VTI ratio: 0.57 AI PHT:            715 msec  AORTA Ao Root diam: 3.80 cm Ao Asc diam:  3.60 cm MITRAL VALVE                TRICUSPID VALVE MV Area (PHT): 3.99 cm     TR Peak grad:   22.7 mmHg MV Decel Time: 190 msec     TR Vmax:        238.00 cm/s MV E velocity: 155.00 cm/s MV A velocity: 56.20 cm/s   SHUNTS MV E/A ratio:  2.76         Systemic VTI:  0.20 m  Systemic Diam: 2.10 cm Kirk Ruths MD Electronically signed by Kirk Ruths MD Signature Date/Time: 08/03/2020/12:27:01 PM    Final      Bary Leriche, PA-C 08/04/2020

## 2020-08-05 ENCOUNTER — Inpatient Hospital Stay (HOSPITAL_COMMUNITY): Payer: MEDICARE

## 2020-08-05 LAB — BASIC METABOLIC PANEL
Anion gap: 12 (ref 5–15)
BUN: 28 mg/dL — ABNORMAL HIGH (ref 8–23)
CO2: 23 mmol/L (ref 22–32)
Calcium: 8.8 mg/dL — ABNORMAL LOW (ref 8.9–10.3)
Chloride: 108 mmol/L (ref 98–111)
Creatinine, Ser: 1.81 mg/dL — ABNORMAL HIGH (ref 0.61–1.24)
GFR, Estimated: 36 mL/min — ABNORMAL LOW (ref >60)
Glucose, Bld: 114 mg/dL — ABNORMAL HIGH (ref 70–99)
Potassium: 3.4 mmol/L — ABNORMAL LOW (ref 3.5–5.1)
Sodium: 143 mmol/L (ref 135–145)

## 2020-08-05 LAB — CBC
HCT: 34.4 % — ABNORMAL LOW (ref 39.0–52.0)
Hemoglobin: 10.8 g/dL — ABNORMAL LOW (ref 13.0–17.0)
MCH: 26.9 pg (ref 26.0–34.0)
MCHC: 31.4 g/dL (ref 30.0–36.0)
MCV: 85.8 fL (ref 80.0–100.0)
Platelets: 127 10*3/uL — ABNORMAL LOW (ref 150–400)
RBC: 4.01 MIL/uL — ABNORMAL LOW (ref 4.22–5.81)
RDW: 16.4 % — ABNORMAL HIGH (ref 11.5–15.5)
WBC: 7.1 10*3/uL (ref 4.0–10.5)
nRBC: 0 % (ref 0.0–0.2)

## 2020-08-05 LAB — MAGNESIUM: Magnesium: 1.8 mg/dL (ref 1.7–2.4)

## 2020-08-05 MED ORDER — HALOPERIDOL LACTATE 5 MG/ML IJ SOLN
2.0000 mg | Freq: Four times a day (QID) | INTRAMUSCULAR | Status: DC | PRN
Start: 2020-08-05 — End: 2020-08-14
  Filled 2020-08-05: qty 1

## 2020-08-05 MED ORDER — QUETIAPINE FUMARATE 25 MG PO TABS
12.5000 mg | ORAL_TABLET | Freq: Three times a day (TID) | ORAL | Status: DC
  Administered 2020-08-05 (×2): 12.5 mg via ORAL
  Filled 2020-08-05 (×2): qty 1

## 2020-08-05 MED ORDER — ZOLPIDEM TARTRATE 5 MG PO TABS
5.0000 mg | ORAL_TABLET | Freq: Every evening | ORAL | Status: DC | PRN
  Administered 2020-08-06 – 2020-08-08 (×2): 5 mg via ORAL
  Filled 2020-08-05 (×2): qty 1

## 2020-08-05 NOTE — Progress Notes (Signed)
Inpatient Rehab Admissions:  Inpatient Rehab Consult received.  I met with patient at the bedside for rehabilitation assessment and to discuss goals and expectations of an inpatient rehab admission.  Patient lethargic and fell asleep, so discussed CIR mostly with daughter, [name].  [name] acknowledged understanding of CIR goals and expectations. Becky interested in [facility] for pt.  [name] reported that she and sister, Remo Lipps, will be able to provide supervision for pt and are also willing to hire additional help if necessary. Will continue to follow.  Signed: Gayland Curry, [place], [place] Admissions Coordinator [phone]

## 2020-08-05 NOTE — Progress Notes (Signed)
Physical Therapy Treatment Patient Details Name: Chris Peterson MRN: [id number] DOB: Jan 04, 1930 Today's Date: 08/05/2020    History of Present Illness Pt is an 86 y.o. male with a hx of sciatica, pre-diabetes, pericardial effusion, bilat atherosclerotic renal artery stenosis, paroxysmal a-fib, HTN, CKD stage 3B, AAA, GI bleed, HLD, and chronic anemia who presented with partial vision loss in both eyes, lightheadedness, slurred speech, slight L-sided weakness, and confusion. CT was negative, but MRI revealed moderate R side PCA acute CVA involving temporo-occipital lobes along with small acute cortical infarcts of L frontal lobe and L parieto-occipital junction and MRA showed occlusion of R P2 PCA. NIHSS = 2. No tPA administered.    PT Comments    Pt up in chair on arrival. Daughter present for session and states pt got up with RN staff to ambulate to the restroom and his gait appeared improved. During session pt performed 5x sit<>stand during which HR increased 160s, returning to 110s after seated rest break. Had pt perform seated ther ex with frequent breaks. HR did again increase to 130s with seated exercises. Pt seems very motivated and willing to participate. Anticipate he will progress well once HR and BP are at therapeutic levels. Will continue to follow acutely for mobility progression.     Follow Up Recommendations  CIR;Supervision/Assistance - 24 hour     Equipment Recommendations  None recommended by PT    Recommendations for Other Services Rehab consult     Precautions / Restrictions Precautions Precautions: Fall Precaution Comments: HOH; monitor HR and BP    Mobility  Bed Mobility               General bed mobility comments: up in chair on arrival  Transfers Overall transfer level: Needs assistance Equipment used: 1 person hand held assist Transfers: Sit to/from Stand Sit to Stand: Min assist         General transfer comment: face to face from recliner  chair. VC for hand placement. Noted wide BOS  Ambulation/Gait             General Gait Details: deferred secondary to tachycardia in 160s   Stairs             Wheelchair Mobility    Modified Rankin (Stroke Patients Only) Modified Rankin (Stroke Patients Only) Pre-Morbid Rankin Score: No significant disability Modified Rankin: Moderately severe disability     Balance Overall balance assessment: Needs assistance Sitting-balance support: Feet supported;Bilateral upper extremity supported Sitting balance-Leahy Scale: Fair       Standing balance-Leahy Scale: Poor Standing balance comment: statically stood with PTA hands on for balance                            Cognition Arousal/Alertness: Awake/alert Behavior During Therapy: Impulsive;WFL for tasks assessed/performed;Flat affect Overall Cognitive Status: Impaired/Different from baseline Area of Impairment: Memory;Safety/judgement;Awareness;Problem solving                     Memory: Decreased short-term memory Following Commands: Follows one step commands consistently Safety/Judgement: Decreased awareness of safety;Decreased awareness of deficits Awareness: Emergent Problem Solving: Slow processing;Requires verbal cues;Requires tactile cues General Comments: Pt following commands better today and less impulsivity noted, however daughter does report pt attempts to get up w/o assist. Mostly flat during session but seems willing to participate      Exercises General Exercises - Lower Extremity Ankle Circles/Pumps: AROM;Both;15 reps;Seated Long Arc Quad: AROM;Both;15 reps;Seated Heel Slides:  AROM;Both;15 reps;Seated Hip ABduction/ADduction: AROM;Both;15 reps;10 reps;Seated (abduction 15x, adduction with pillow sqeeze for 5 seconds 10x) Hip Flexion/Marching: AROM;Both;15 reps;Seated Other Exercises Other Exercises: sit<>stand 5x for neuro re-ed. Wide BOS, but good weight distribution.     General Comments General comments (skin integrity, edema, etc.): BP 166/117 pt cleared for permissive hypertension of <220/120; HR increasing to 160s after 5x sit<>stand. Returned to 110 with rest. Increased to 130s with seated ther ex.      Pertinent Vitals/Pain Pain Assessment: No/denies pain    Home Living                      Prior Function            PT Goals (current goals can now be found in the care plan section) Acute Rehab PT Goals Patient Stated Goal: to go home PT Goal Formulation: With patient/family Time For Goal Achievement: 08/18/20 Potential to Achieve Goals: Good Progress towards PT goals: Progressing toward goals    Frequency    Min 4X/week      PT Plan      Co-evaluation              AM-PAC PT "6 Clicks" Mobility   Outcome Measure  Help needed turning from your back to your side while in a flat bed without using bedrails?: A Little Help needed moving from lying on your back to sitting on the side of a flat bed without using bedrails?: A Little Help needed moving to and from a bed to a chair (including a wheelchair)?: A Lot Help needed standing up from a chair using your arms (e.g., wheelchair or bedside chair)?: A Little Help needed to walk in hospital room?: A Lot Help needed climbing 3-5 steps with a railing? : A Lot 6 Click Score: 15    End of Session Equipment Utilized During Treatment: Gait belt Activity Tolerance: Treatment limited secondary to medical complications (Comment) (Limited by tachycardia) Patient left: in chair;with call bell/phone within reach;with family/visitor present (chair alarm off for daughter to perform bathing. Given instructions to turn alarm back on.) Nurse Communication: Mobility status;Other (comment) (HR) PT Visit Diagnosis: Unsteadiness on feet (R26.81);Other abnormalities of gait and mobility (R26.89);Muscle weakness (generalized) (M62.81);Difficulty in walking, not elsewhere classified  (R26.2);Other symptoms and signs involving the nervous system (R29.898)     Time: 8453-6468 PT Time Calculation (min) (ACUTE ONLY): 23 min  Charges:  $Therapeutic Exercise: 8-22 mins $Neuromuscular Re-education: 8-22 mins                    Benjiman Core, [place] Pager [phone] Acute Rehab   Allena Katz 08/05/2020, 11:45 AM

## 2020-08-05 NOTE — Progress Notes (Signed)
[facility] Progress Note  Patient: Chris Peterson    ZGY:[id number]  DOA: 08/02/2020     Date of Service: the patient was seen and examined on 08/05/2020  Brief hospital course: Past medical history of A. fib not on anticoagulation secondary to GI bleed, CKD 3B, AAA, HLD, chronic anemia.  Presents with acute CVA. Currently plan is for arrangement for CIR. also monitor renal function before initiating anticoagulation  Assessment and Plan: 1.  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 fib CT scan of the head shows hypoattenuation of the right temporoparietal region MRI brain confirms acute CVA MRI shows P2 occlusion. 2D echocardiogram EF 75%. LDL 43. Hemoglobin A1c 6.6. PT OT recommends CIR Patient is not on any antithrombotic therapy prior to admission. Currently on aspirin 325 mg daily. Neurology recommends initiation of anticoagulation starting 12/20. GI and vascular surgery are okay with initiation of the anticoagulation as well. Renal function will determine the type of anticoagulation  2.  Paroxysmal A. fib Rate control for now. Neurology recommending anticoagulation. GI and vascular surgery are okay with initiation of the anticoagulation. Discussed with patient and daughter in detail as well and both are agreeable for anticoagulation as well. Understand the risk for bleeding. Patient has mentioned that he does not want to have a situation like he had in the past with weakness after being on a blood thinner, likely secondary to anemia. Daughter had questions regarding what currently doing to control the A. fib.  Explained to her that the patient is currently rate controlled, based on the RA and LA on the echocardiogram patient will not maintain sinus rhythm and with him having bradycardia during sleep patient will not be a candidate for further rate control medication. If the patient remains symptomatic and during the daytime has  bradycardic events he may require evaluation by EP for pacemaker implant. Daughter is concerned regarding patient's risk for stroke in the setting of A. fib explained to her that if the patient remains on anticoagulation the risk will be less than what it was before when he was not on any anticoagulation.  3.  Hyperlipidemia LDL 43. Crestor will continue.  4.  Acute kidney injury on CKD 3B Renal function mildly worsened from the baseline. Currently urine output adequate. Patient is taking Lasix and lisinopril prior to admission which might have contributed to his current presentation. Ultrasound renal negative for any obstruction.  Bladder appears normal as well.  5.  Prior history of GI bleed. EGD 2019 shows nonbleeding gastric ulcer with visible blood vessel. Treated with cautery. No further bleeding since then. Patient is not on anticoagulation since then.  Discussed with GI.  Currently plan is to initiate anticoagulation and monitor.  Appreciate assistance. Continue PPI.  6.  Active smoker Nicotine patch outpatient. Patient willing to quit.  7.  Acute delirium. During the day, patient is alert awake and oriented x3. Later in the afternoon Daughter reports that the patient becomes delirious. Required Haldol last night. Will increase Seroquel frequency to 3 times daily. Also as needed Ambien to help with insomnia.  8.  Potential dementia. CT scan shows evidence of volume loss. Delirium reported by the patient daughter can be sundowning. Monitor for now.  9. Goals of care  Discuss in detail with the pt on 08/03/2020 and 08/04/2020.  Pt has living will that states DNR.  Pt wants to be full code for now based on my discussion with him. Daughter also wants full  code.  Wife will be the primary HCPOA and daughters will be secondary.  On night of 12/16 pt informed night cover that he wanted to be not on ventilator. And changed to limited code.  On repeat conversation pt wanted to  be full code again.   Diet: D3 diet DVT Prophylaxis:   Place and maintain sequential compression device Start: 08/03/20 0934    Advance goals of care discussion: Full code  Family Communication: family was present at bedside, at the time of interview.  The pt provided permission to discuss medical plan with the family. Opportunity was given to ask question and all questions were answered satisfactorily.   Disposition:  Status is: Inpatient  Remains inpatient appropriate because:Ongoing diagnostic testing needed not appropriate for outpatient work up   Dispo: The patient is from: Home              Anticipated d/c is to: to be determined              Anticipated d/c date is: 1 day              Patient currently is not medically stable to d/c.  Subjective: No acute complaint no nausea no vomiting.  No fever no chills.  No chest pain.  No abdominal pain.  No urinary symptoms.  Physical Exam:  General: Appear in mild distress, no Rash; Oral Mucosa Clear, moist. no Abnormal Neck Mass Or lumps, Conjunctiva normal  Cardiovascular: S1 and S2 Present, no Murmur, Respiratory: good respiratory effort, Bilateral Air entry present and CTA, no Crackles, no wheezes Abdomen: Bowel Sound present, Soft and no tenderness Extremities: no Pedal edema Neurology: alert and oriented to time, place, and person affect appropriate. no new focal deficit Gait not checked due to patient safety concerns  Vitals:   08/05/20 0035 08/05/20 0500 08/05/20 1209 08/05/20 1544  BP: (!) 178/106 (!) 166/117 (!) 146/78 (!) 143/87  Pulse: 90  88 89  Resp: 18 18 18 18   Temp: 98 F (36.7 C) 98.8 F (37.1 C) 97.7 F (36.5 C) 98.2 F (36.8 C)  TempSrc: Oral Oral Oral Oral  SpO2: 96% 97% 99% 95%  Weight:      Height:       No intake or output data in the 24 hours ending 08/05/20 1741 Filed Weights   08/02/20 1138  Weight: 61.2 kg    Data Reviewed: I have personally reviewed and interpreted daily labs, tele  strips, imagings as discussed above. I reviewed all nursing notes, pharmacy notes, vitals, pertinent old records I have discussed plan of care as described above with RN and patient/family.  CBC: Recent Labs  Lab 08/02/20 1241 08/03/20 0907 08/05/20 0312  WBC 5.2 6.1 7.1  NEUTROABS 3.0  --   --   HGB 10.9* 11.8* 10.8*  HCT 35.1* 37.7* 34.4*  MCV 86.7 88.5 85.8  PLT 134* 144* 413*   Basic Metabolic Panel: Recent Labs  Lab 08/02/20 1241 08/03/20 0907 08/04/20 0453 08/04/20 1804 08/05/20 0312  NA 143 145 142 145 143  K 3.7 3.7 3.2* 3.7 3.4*  CL 106 107 107 108 108  CO2 27 19* 23 24 23   GLUCOSE 159* 103* 136* 102* 114*  BUN 38* 34* 30* 26* 28*  CREATININE 1.80* 1.74* 1.85* 1.73* 1.81*  CALCIUM 9.0 8.9 8.8* 9.0 8.8*  MG  --   --  1.9 1.9 1.8    Studies: US RENAL  Result Date: 08/05/2020 CLINICAL DATA:  Acute renal insufficiency EXAM:  RENAL / URINARY TRACT ULTRASOUND COMPLETE COMPARISON:  None. FINDINGS: Right Kidney: Renal measurements: 10.2 x 4.7 x 4.8 cm = volume: 119.7 mL. Increased cortical echogenicity. No suspicious mass, stone, or hydronephrosis. Left Kidney: Renal measurements: 11.1 x 5.9 x 6.1 cm = volume: 210.4 mL. Increased cortical echogenicity. No suspicious mass, stone, or hydronephrosis. Bladder: Appears normal for degree of bladder distention. Other: None. IMPRESSION: 1. Increased cortical echogenicity bilaterally consistent with medical renal disease. No other abnormalities. Electronically Signed   By: Dorise Bullion III M.D   On: 08/05/2020 16:34    Scheduled Meds: . aspirin  325 mg Oral Daily  . cholecalciferol  1,000 Units Oral Daily  . pantoprazole  40 mg Oral Daily  . QUEtiapine  12.5 mg Oral TID  . rosuvastatin  40 mg Oral Daily  . tamsulosin  0.4 mg Oral Daily   Continuous Infusions: PRN Meds: acetaminophen **OR** acetaminophen (TYLENOL) oral liquid 160 mg/5 mL **OR** acetaminophen, haloperidol lactate, senna-docusate, zolpidem  Time spent: 35  minutes  Author: Berle Mull, MD [facility] 08/05/2020 5:41 PM  To reach On-call, see care teams to locate the attending and reach out via [email or web address]. Between 7PM-7AM, please contact night-coverage If you still have difficulty reaching the attending provider, please page the [facility] (Director on Call) for [facility] on amion for assistance.

## 2020-08-06 LAB — CBC
HCT: 34.6 % — ABNORMAL LOW (ref 39.0–52.0)
Hemoglobin: 10.9 g/dL — ABNORMAL LOW (ref 13.0–17.0)
MCH: 26.9 pg (ref 26.0–34.0)
MCHC: 31.5 g/dL (ref 30.0–36.0)
MCV: 85.4 fL (ref 80.0–100.0)
Platelets: 119 K/uL — ABNORMAL LOW (ref 150–400)
RBC: 4.05 MIL/uL — ABNORMAL LOW (ref 4.22–5.81)
RDW: 16.6 % — ABNORMAL HIGH (ref 11.5–15.5)
WBC: 6.4 K/uL (ref 4.0–10.5)
nRBC: 0 % (ref 0.0–0.2)

## 2020-08-06 LAB — BASIC METABOLIC PANEL WITH GFR
Anion gap: 10 (ref 5–15)
BUN: 28 mg/dL — ABNORMAL HIGH (ref 8–23)
CO2: 23 mmol/L (ref 22–32)
Calcium: 8.4 mg/dL — ABNORMAL LOW (ref 8.9–10.3)
Chloride: 108 mmol/L (ref 98–111)
Creatinine, Ser: 1.75 mg/dL — ABNORMAL HIGH (ref 0.61–1.24)
GFR, Estimated: 37 mL/min — ABNORMAL LOW
Glucose, Bld: 113 mg/dL — ABNORMAL HIGH (ref 70–99)
Potassium: 3.5 mmol/L (ref 3.5–5.1)
Sodium: 141 mmol/L (ref 135–145)

## 2020-08-06 LAB — MAGNESIUM: Magnesium: 1.8 mg/dL (ref 1.7–2.4)

## 2020-08-06 MED ORDER — CARVEDILOL 3.125 MG PO TABS
3.1250 mg | ORAL_TABLET | Freq: Two times a day (BID) | ORAL | Status: DC
  Administered 2020-08-06 – 2020-08-08 (×6): 3.125 mg via ORAL
  Filled 2020-08-06 (×6): qty 1

## 2020-08-06 MED ORDER — QUETIAPINE FUMARATE 25 MG PO TABS
12.5000 mg | ORAL_TABLET | Freq: Two times a day (BID) | ORAL | Status: DC
Start: 2020-08-06 — End: 2020-08-14
  Administered 2020-08-06 – 2020-08-13 (×16): 12.5 mg via ORAL
  Filled 2020-08-06 (×18): qty 1

## 2020-08-06 MED ORDER — POLYETHYLENE GLYCOL 3350 17 G PO PACK
17.0000 g | PACK | Freq: Every day | ORAL | Status: DC
  Administered 2020-08-06 – 2020-08-14 (×5): 17 g via ORAL
  Filled 2020-08-06 (×8): qty 1

## 2020-08-06 MED ORDER — SENNOSIDES-DOCUSATE SODIUM 8.6-50 MG PO TABS
1.0000 | ORAL_TABLET | Freq: Two times a day (BID) | ORAL | Status: DC
  Administered 2020-08-06 – 2020-08-14 (×13): 1 via ORAL
  Filled 2020-08-06 (×17): qty 1

## 2020-08-06 MED ORDER — APIXABAN 2.5 MG PO TABS
2.5000 mg | ORAL_TABLET | Freq: Two times a day (BID) | ORAL | Status: DC
  Administered 2020-08-06 – 2020-08-14 (×17): 2.5 mg via ORAL
  Filled 2020-08-06 (×17): qty 1

## 2020-08-06 NOTE — Progress Notes (Signed)
Occupational Therapy Treatment Patient Details Name: Chris Peterson MRN: [id number] DOB: [birth year] Today's Date: 08/06/2020    History of present illness Pt is an 86 y.o. male with a hx of sciatica, pre-diabetes, pericardial effusion, bilat atherosclerotic renal artery stenosis, paroxysmal a-fib, HTN, CKD stage 3B, AAA, GI bleed, HLD, and chronic anemia who presented with partial vision loss in both eyes, lightheadedness, slurred speech, slight L-sided weakness, and confusion. CT was negative, but MRI revealed moderate R side PCA acute CVA involving temporo-occipital lobes along with small acute cortical infarcts of L frontal lobe and L parieto-occipital junction and MRA showed occlusion of R P2 PCA. NIHSS = 2. No tPA administered.   OT comments  Pt with good participation, decreased insight of reported visual impairment, continues with ?superior impairment during functional tasks. Tolerated safety training with functional transfers simulated for ADL's of bathing/dressing. HR sustained between 90's on arrival up to 115 seated and standing EOB. No reports of dizziness. Requiring Min guard for transfers and standing tasks this date, no LOB observed but cues for safety with management of RW and simulation of dynamic standing tasks for toileting/bathing. Pt does present decreased insight at this time leading to increased risk of falls, but agreeable to continued therapy. OT will continue to follow acutely.   Follow Up Recommendations  CIR    Equipment Recommendations  3 in 1 bedside commode    Recommendations for Other Services      Precautions / Restrictions Precautions Precautions: Fall Precaution Comments: HOH; monitor HR and BP       Mobility Bed Mobility Overal bed mobility: Needs Assistance Bed Mobility: Supine to Sit     Supine to sit: Min guard        Transfers Overall transfer level: Needs assistance Equipment used: Rolling walker (2 wheeled) Transfers: Sit to/from  Stand Sit to Stand: Min guard         General transfer comment: with use of RW pivot transfer with x5 steps d/t safety deficits    Balance Overall balance assessment: Needs assistance Sitting-balance support: Feet supported;Single extremity supported Sitting balance-Leahy Scale: Fair                                     ADL either performed or assessed with clinical judgement   ADL       Grooming: Set up;Sitting                   Toilet Transfer: Min Fish farm manager Details (indicate cue type and reason): cues for sequening and management of RW as pt endorsing increased familiarity with SPC.           General ADL Comments: with increased time and cues pt demo's ability to compelte familiar ADL's of UB dressing/grooming and feedwith with set up     Vision       Perception     Praxis      Cognition Arousal/Alertness: Awake/alert Behavior During Therapy: Impulsive;WFL for tasks assessed/performed;Flat affect Overall Cognitive Status: Impaired/Different from baseline Area of Impairment: Memory;Safety/judgement;Awareness;Problem solving                     Memory: Decreased short-term memory Following Commands: Follows one step commands consistently Safety/Judgement: Decreased awareness of safety;Decreased awareness of deficits Awareness: Emergent Problem Solving: Slow processing;Requires verbal cues General Comments: pt with improved attention but continues with decreased multi step command follow requiring  x2-3 repeated directions for processing and following commands        Exercises     Shoulder Instructions       General Comments      Pertinent Vitals/ Pain       Pain Assessment: No/denies pain  Home Living                                          Prior Functioning/Environment              Frequency  Min 2X/week        Progress Toward Goals  OT Goals(current goals can now be  found in the care plan section)  Progress towards OT goals: Progressing toward goals  Acute Rehab OT Goals Patient Stated Goal: to go home OT Goal Formulation: With patient/family Time For Goal Achievement: 08/18/20 Potential to Achieve Goals: Good  Plan Discharge plan remains appropriate    Co-evaluation                 AM-PAC OT "6 Clicks" Daily Activity     Outcome Measure   Help from another person eating meals?: A Little Help from another person taking care of personal grooming?: A Little Help from another person toileting, which includes using toliet, bedpan, or urinal?: A Little Help from another person bathing (including washing, rinsing, drying)?: A Little Help from another person to put on and taking off regular upper body clothing?: A Little Help from another person to put on and taking off regular lower body clothing?: A Little 6 Click Score: 18    End of Session Equipment Utilized During Treatment: Gait belt;Rolling walker  OT Visit Diagnosis: Unsteadiness on feet (R26.81);Other abnormalities of gait and mobility (R26.89);Muscle weakness (generalized) (M62.81);Other symptoms and signs involving cognitive function;Low vision, both eyes (H54.2)   Activity Tolerance Patient tolerated treatment well;Treatment limited secondary to medical complications (Comment)   Patient Left in chair;with chair alarm set;with call bell/phone within reach   Nurse Communication          Time: 1206-1221 OT Time Calculation (min): 15 min  Charges: OT General Charges $OT Visit: 1 Visit OT Treatments $Self Care/Home Management : 8-22 mins  Hardy Harcum OTR/L acute rehab services Office: [place] 08/06/2020, 1:34 PM

## 2020-08-06 NOTE — Progress Notes (Signed)
[facility] Progress Note  Patient: Chris Peterson    JME:[id number]  DOA: 08/02/2020     Date of Service: the patient was seen and examined on 08/06/2020  Brief hospital course: Past medical history of A. fib not on anticoagulation secondary to GI bleed, CKD 3B, AAA, HLD, chronic anemia.  Presents with acute CVA. Currently plan is for arrangement for CIR. also monitor renal function before initiating anticoagulation.  Assessment and Plan: 1.  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 fib CT scan of the head shows hypoattenuation of the right temporoparietal region MRI brain confirms acute CVA MRI shows P2 occlusion. 2D echocardiogram EF 75%. LDL 43. Hemoglobin A1c 6.6. PT OT recommends CIR Patient is not on any antithrombotic therapy prior to admission. Currently on aspirin 325 mg daily. Neurology recommends initiation of anticoagulation initially 5 days after but later on starting 12/17.  GI and vascular surgery are okay with initiation of the anticoagulation as well. Discussed in detail regarding pros and cons of the anticoagulation and various options for anticoagulation. Eliquis 2.5 mg twice daily was started on 08/06/2020. Aspirin discontinued.  2.  Paroxysmal A. fib Rate control for now. Neurology recommending anticoagulation. GI and vascular surgery are okay with initiation of the anticoagulation. Discussed with patient and daughter in detail as well and both are agreeable for anticoagulation as well. Understand the risk for bleeding. Patient has mentioned that he does not want to have a situation like he had in the past with weakness after being on a blood thinner, likely secondary to anemia. But currently agreeable for anticoagulation. Resuming home Coreg but at lower dose for rate control.  3.  Hyperlipidemia LDL 43. Crestor will continue.  4.  Acute kidney injury on CKD 3B Renal function mildly worsened from the  baseline. Currently urine output adequate. Patient is taking Lasix and lisinopril prior to admission which might have contributed to his current presentation. Ultrasound renal negative for any obstruction.  Bladder appears normal as well.  5.  Prior history of GI bleed. EGD 2019 shows nonbleeding gastric ulcer with visible blood vessel. Treated with cautery. No further bleeding since then. Patient is not on anticoagulation since then.  Discussed with GI.  Currently plan is to initiate anticoagulation and monitor.  Appreciate assistance. Continue PPI.  6.  Active smoker Nicotine patch outpatient. Patient willing to quit.  7.  Acute delirium. Improving with Seroquel. During the day, patient is alert awake and oriented x3. Later in the afternoon Daughter reports that the patient becomes delirious. Continue Seroquel twice daily starting every evening. Also as needed Ambien to help with insomnia.  8.  Potential dementia. CT scan shows evidence of volume loss. Delirium reported by the patient daughter can be sundowning. Monitor for now.  9. Goals of care  Discuss in detail with the pt on 08/03/2020 and 08/04/2020.  Pt has living will that states DNR.  Pt wants to be full code for now based on my discussion with him. Daughter also wants full code.  Wife will be the primary HCPOA and daughters will be secondary.  On night of 12/16 pt informed night cover that he wanted to be not on ventilator. And changed to limited code.  On repeat conversation pt wanted to be full code again.   Diet: D3 diet DVT Prophylaxis:   apixaban (ELIQUIS) tablet 2.5 mg Start: 08/06/20 1045 Place and maintain sequential compression device Start: 08/03/20 0934    Advance goals of care  discussion: Full code  Family Communication: family was present at bedside, at the time of interview.  The pt provided permission to discuss medical plan with the family. Opportunity was given to ask question and all questions  were answered satisfactorily.   Disposition:  Status is: Inpatient  Remains inpatient appropriate because:Ongoing diagnostic testing needed not appropriate for outpatient work up   Dispo: The patient is from: Home              Anticipated d/c is to: CIR              Anticipated d/c date is: 1 day              Patient currently is medically stable to d/c.  Subjective: No nausea no vomiting but no fever no chills. Reports constipation. Oral intake adequate.  Physical Exam:  General: Appear in mild distress, no Rash; Oral Mucosa Clear, moist. no Abnormal Neck Mass Or lumps, Conjunctiva normal  Cardiovascular: S1 and S2 Present, no Murmur, Respiratory: good respiratory effort, Bilateral Air entry present and CTA, no Crackles, no wheezes Abdomen: Bowel Sound present, Soft and no tenderness Extremities: no Pedal edema Neurology: alert and oriented to time, place, and person affect appropriate. no new focal deficit Gait not checked due to patient safety concerns  Vitals:   08/06/20 0400 08/06/20 0857 08/06/20 1152 08/06/20 1536  BP: (!) 167/92 (!) 143/102 (!) 167/101 (!) 160/94  Pulse: (!) 106 96 78 94  Resp:  20 18 17   Temp: 98.4 F (36.9 C) 97.8 F (36.6 C) 97.7 F (36.5 C) 98 F (36.7 C)  TempSrc: Oral Oral Oral Oral  SpO2: 96% 96% 99% 97%  Weight:      Height:       No intake or output data in the 24 hours ending 08/06/20 1839 Filed Weights   08/02/20 1138  Weight: 61.2 kg    Data Reviewed: I have personally reviewed and interpreted daily labs, tele strips, imagings as discussed above. I reviewed all nursing notes, pharmacy notes, vitals, pertinent old records I have discussed plan of care as described above with RN and patient/family.  CBC: Recent Labs  Lab 08/02/20 1241 08/03/20 0907 08/05/20 0312 08/06/20 0112  WBC 5.2 6.1 7.1 6.4  NEUTROABS 3.0  --   --   --   HGB 10.9* 11.8* 10.8* 10.9*  HCT 35.1* 37.7* 34.4* 34.6*  MCV 86.7 88.5 85.8 85.4  PLT 134*  144* 127* 824*   Basic Metabolic Panel: Recent Labs  Lab 08/03/20 0907 08/04/20 0453 08/04/20 1804 08/05/20 0312 08/06/20 0112  NA 145 142 145 143 141  K 3.7 3.2* 3.7 3.4* 3.5  CL 107 107 108 108 108  CO2 19* 23 24 23 23   GLUCOSE 103* 136* 102* 114* 113*  BUN 34* 30* 26* 28* 28*  CREATININE 1.74* 1.85* 1.73* 1.81* 1.75*  CALCIUM 8.9 8.8* 9.0 8.8* 8.4*  MG  --  1.9 1.9 1.8 1.8    Studies: No results found.  Scheduled Meds:  apixaban  2.5 mg Oral BID   carvedilol  3.125 mg Oral BID WC   cholecalciferol  1,000 Units Oral Daily   pantoprazole  40 mg Oral Daily   polyethylene glycol  17 g Oral Daily   QUEtiapine  12.5 mg Oral BID   rosuvastatin  40 mg Oral Daily   senna-docusate  1 tablet Oral BID   tamsulosin  0.4 mg Oral Daily   Continuous Infusions: PRN Meds: acetaminophen **OR** acetaminophen (  TYLENOL) oral liquid 160 mg/5 mL **OR** acetaminophen, haloperidol lactate, zolpidem  Time spent: 35 minutes  Author: Berle Mull, MD [facility] 08/06/2020 6:39 PM  To reach On-call, see care teams to locate the attending and reach out via [email or web address]. Between 7PM-7AM, please contact night-coverage If you still have difficulty reaching the attending provider, please page the [facility] (Director on Call) for [facility] on amion for assistance.

## 2020-08-07 ENCOUNTER — Telehealth: Payer: Self-pay | Admitting: Cardiovascular Disease

## 2020-08-07 LAB — BASIC METABOLIC PANEL
Anion gap: 10 (ref 5–15)
BUN: 26 mg/dL — ABNORMAL HIGH (ref 8–23)
CO2: 24 mmol/L (ref 22–32)
Calcium: 8.4 mg/dL — ABNORMAL LOW (ref 8.9–10.3)
Chloride: 107 mmol/L (ref 98–111)
Creatinine, Ser: 1.77 mg/dL — ABNORMAL HIGH (ref 0.61–1.24)
GFR, Estimated: 36 mL/min — ABNORMAL LOW (ref >60)
Glucose, Bld: 107 mg/dL — ABNORMAL HIGH (ref 70–99)
Potassium: 3.7 mmol/L (ref 3.5–5.1)
Sodium: 141 mmol/L (ref 135–145)

## 2020-08-07 LAB — CBC
HCT: 32.6 % — ABNORMAL LOW (ref 39.0–52.0)
Hemoglobin: 10.3 g/dL — ABNORMAL LOW (ref 13.0–17.0)
MCH: 27 pg (ref 26.0–34.0)
MCHC: 31.6 g/dL (ref 30.0–36.0)
MCV: 85.6 fL (ref 80.0–100.0)
Platelets: 118 10*3/uL — ABNORMAL LOW (ref 150–400)
RBC: 3.81 MIL/uL — ABNORMAL LOW (ref 4.22–5.81)
RDW: 16.3 % — ABNORMAL HIGH (ref 11.5–15.5)
WBC: 5.5 10*3/uL (ref 4.0–10.5)
nRBC: 0 % (ref 0.0–0.2)

## 2020-08-07 LAB — MAGNESIUM: Magnesium: 1.9 mg/dL (ref 1.7–2.4)

## 2020-08-07 NOTE — Telephone Encounter (Signed)
New Message:      Daughter wanted to make sure Dr Burt Knack to know that pt is in the hospital and wants him or somebody from the group to come see him please. I tolld the daughter yo Marketing executive at the [employer] about this.

## 2020-08-07 NOTE — Progress Notes (Signed)
Physical Therapy Treatment Patient Details Name: Chris Peterson MRN: [id number] DOB: 17-Feb-1930 Today's Date: 08/07/2020    History of Present Illness Pt is an 86 y.o. male with a hx of sciatica, pre-diabetes, pericardial effusion, bilat atherosclerotic renal artery stenosis, paroxysmal a-fib, HTN, CKD stage 3B, AAA, GI bleed, HLD, and chronic anemia who presented with partial vision loss in both eyes, lightheadedness, slurred speech, slight L-sided weakness, and confusion. CT was negative, but MRI revealed moderate R side PCA acute CVA involving temporo-occipital lobes along with small acute cortical infarcts of L frontal lobe and L parieto-occipital junction and MRA showed occlusion of R P2 PCA. NIHSS = 2. No tPA administered.    PT Comments    Pt received in bed lying comfortably and very calm. Pt able to verbalize that he was very anxious last night and that he has struggled with anxiety for a long time. Pt able to to come to EOB without physical assist. Ambulated 10' within room with HR 86 bpm and then 100' in hallway with HR 91 bpm. Pt needed directional cues as well as min A for safety and increased assist with turning due to narrow BOS. PT will continue to follow.   Follow Up Recommendations  CIR;Supervision/Assistance - 24 hour     Equipment Recommendations  None recommended by PT    Recommendations for Other Services Rehab consult     Precautions / Restrictions Precautions Precautions: Fall Precaution Comments: HOH; monitor HR and BP Restrictions Weight Bearing Restrictions: No    Mobility  Bed Mobility Overal bed mobility: Needs Assistance Bed Mobility: Supine to Sit;Sit to Supine     Supine to sit: Supervision Sit to supine: Supervision   General bed mobility comments: pt out and into bed with supervision for safety but no physical assist  Transfers Overall transfer level: Needs assistance Equipment used: Rolling walker (2 wheeled) Transfers: Sit to/from  Stand Sit to Stand: Supervision         General transfer comment: vc's for hand placement with RW  Ambulation/Gait Ambulation/Gait assistance: Min assist Gait Distance (Feet): 110 Feet (10, 100) Assistive device: Rolling walker (2 wheeled) Gait Pattern/deviations: Decreased stride length;Step-through pattern;Trunk flexed;Narrow base of support Gait velocity: reduced Gait velocity interpretation: <1.8 ft/sec, indicate of risk for recurrent falls General Gait Details: pt HR stable no higher than 91 bpm with ambulation. vc's for fwd gaze and obstacle navigation. Pt needed directional cues to find room after walking. Increased assist given with turning due to narrow BOS   Stairs             Wheelchair Mobility    Modified Rankin (Stroke Patients Only) Modified Rankin (Stroke Patients Only) Pre-Morbid Rankin Score: No significant disability Modified Rankin: Moderately severe disability     Balance Overall balance assessment: Needs assistance Sitting-balance support: Feet supported;Single extremity supported Sitting balance-Leahy Scale: Good Sitting balance - Comments: pt able to wt shift in sitting without LOB   Standing balance support: During functional activity;Bilateral upper extremity supported Standing balance-Leahy Scale: Poor Standing balance comment: safer with UE support                            Cognition Arousal/Alertness: Awake/alert Behavior During Therapy: WFL for tasks assessed/performed;Flat affect Overall Cognitive Status: Impaired/Different from baseline Area of Impairment: Memory;Safety/judgement;Awareness;Problem solving                 Orientation Level: Disoriented to;Place Current Attention Level: Sustained Memory: Decreased short-term  memory Following Commands: Follows one step commands consistently Safety/Judgement: Decreased awareness of safety;Decreased awareness of deficits Awareness: Emergent Problem Solving: Slow  processing;Requires verbal cues General Comments: pt reports that he is at home. Pt remembers that he was very anxious and says he got himself worked up because he could not let the idea that he had not taken all of his meds go. Seemed to help pt to talk about it. Pt reports that he has struggled with anxiety his whole life      Exercises      General Comments General comments (skin integrity, edema, etc.): HR 70's to 90's throughout, BP stable      Pertinent Vitals/Pain Pain Assessment: No/denies pain    Home Living                      Prior Function            PT Goals (current goals can now be found in the care plan section) Acute Rehab PT Goals Patient Stated Goal: to go home PT Goal Formulation: With patient/family Time For Goal Achievement: 08/18/20 Potential to Achieve Goals: Good Progress towards PT goals: Progressing toward goals    Frequency    Min 4X/week      PT Plan Current plan remains appropriate    Co-evaluation              AM-PAC PT "6 Clicks" Mobility   Outcome Measure  Help needed turning from your back to your side while in a flat bed without using bedrails?: A Little Help needed moving from lying on your back to sitting on the side of a flat bed without using bedrails?: A Little Help needed moving to and from a bed to a chair (including a wheelchair)?: A Little Help needed standing up from a chair using your arms (e.g., wheelchair or bedside chair)?: A Little Help needed to walk in hospital room?: A Little Help needed climbing 3-5 steps with a railing? : A Lot 6 Click Score: 17    End of Session Equipment Utilized During Treatment: Gait belt Activity Tolerance: Patient tolerated treatment well Patient left: with call bell/phone within reach;with family/visitor present;in bed;with bed alarm set Nurse Communication: Mobility status PT Visit Diagnosis: Unsteadiness on feet (R26.81);Other abnormalities of gait and mobility  (R26.89);Muscle weakness (generalized) (M62.81);Difficulty in walking, not elsewhere classified (R26.2);Other symptoms and signs involving the nervous system (R29.898)     Time: 3646-8032 PT Time Calculation (min) (ACUTE ONLY): 19 min  Charges:  $Gait Training: 8-22 mins                     Leighton Roach, [place]  Pager [phone] Office [place] 08/07/2020, 2:40 PM

## 2020-08-07 NOTE — Telephone Encounter (Signed)
Dr. Burt Knack made aware of patient's hospitalization.

## 2020-08-07 NOTE — Hospital Course (Addendum)
Chris Peterson is an 86 yo male with PMH A. fib not on anticoagulation secondary to GI bleed in past, CKD 3B, AAA, HLD, chronic anemia who presented with left-sided weakness.  After work-up he was found to have a moderate sized right PCA infarct involving the temporooccipital lobes and occlusion of the right P2 PCA.  There were also small acute cortical infarcts involving the left frontal lobe and left parieto-occipital junction.  See below for A&P by problem.

## 2020-08-07 NOTE — TOC Benefit Eligibility Note (Signed)
Transition of Care [facility]) Benefit Eligibility Note    Patient Details  Name: Chris Peterson MRN: [id number] Date of Birth: 05-Jan-1930   Eliquis 2.5 mg.bid for 30 day supply  Covered?: Yes  Tier: 3 Drug  Prescription Coverage Preferred Pharmacy: [facility] with Person/Company/Phone Number:: [facility] [facility] [facility] [phone]  Co-Pay: $47.00  Prior Approval: No  Deductible:  (no deductible on this plan)       Shelda Altes Phone Number: 08/07/2020, 10:53 AM

## 2020-08-07 NOTE — Progress Notes (Signed)
PROGRESS NOTE    LONN IM   [id number]  DOB: September 26, 1929  DOA: 08/02/2020     5  PCP: Lawerance Cruel, MD  CC: weakness  Hospital Course: Past medical history of A. fib not on anticoagulation secondary to GI bleed, CKD 3B, AAA, HLD, chronic anemia.  Presents with acute CVA. Currently plan is for arrangement for CIR.   Interval History:  Daughter present bedside this morning. States that his strength has improved over the past few days which they're happy about. Plan is for going to CIR once insurance approves. She was requesting for Dr. Burt Knack to be made aware of the patient's hospitalization as well.  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 fib CT scan of the head shows hypoattenuation of the right temporoparietal region MRI brain confirms acute CVA MRI shows P2 occlusion. 2D echocardiogram EF 75%. LDL 43. Hemoglobin A1c 6.6. PT OT recommends CIR Patient is not on any antithrombotic therapy prior to admission. GI and vascular surgery are okay with initiation of the anticoagulation as well. Discussed in detail regarding pros and cons of the anticoagulation and various options for anticoagulation. Eliquis 2.5 mg twice daily was started on 08/06/2020. Aspirin discontinued.  Paroxysmal A. fib Rate control for now. Neurology recommending anticoagulation. GI and vascular surgery are okay with initiation of the anticoagulation. Discussed with patient and daughter in detail as well and both are agreeable for anticoagulation as well. Understand the risk for bleeding. Patient has mentioned that he does not want to have a situation like he had in the past with weakness after being  on a blood thinner, likely secondary to anemia. But currently agreeable for anticoagulation. Resuming home Coreg but at lower dose for rate control.  Hyperlipidemia LDL 43. Crestor will continue.  Acute kidney injury on CKD 3B Renal function mildly worsened from the baseline. Currently urine output adequate. Patient is taking Lasix and lisinopril prior to admission which might have contributed to his current presentation. Ultrasound renal negative for any obstruction.  Bladder appears normal as well.  Prior history of GI bleed. EGD 2019 shows nonbleeding gastric ulcer with visible blood vessel. Treated with cautery. No further bleeding since then. Patient is not on anticoagulation since then.  Discussed with GI.  Currently plan is to initiate anticoagulation and monitor.  Appreciate assistance. Continue PPI.  Active smoker Nicotine patch outpatient. Patient willing to quit.  Acute delirium. Improving with Seroquel. Sundowning During the day, patient is alert awake and oriented x3. Later in the afternoon Daughter reports that the patient becomes delirious. Continue Seroquel twice daily starting every evening. Also as needed Ambien to help with insomnia.  Potential dementia. CT scan shows evidence of volume loss. Delirium reported by the patient daughter can be sundowning. Monitor for now.  Goals of care  -Multiple discussions had. Ultimately patient now is a full code  Antimicrobials:   DVT prophylaxis: Eliquis Code Status: Full Family Communication: Daughter bedside Disposition Plan: Status is: Inpatient  Remains inpatient appropriate because:Inpatient level of care appropriate due to severity of illness   Dispo: The patient is from: Home              Anticipated d/c is to: CIR  Anticipated d/c date is: 1 day              Patient currently is medically stable to d/c.       Objective: Blood pressure (!) 164/87, pulse 79, temperature 98.3  F (36.8 C), temperature source Oral, resp. rate 18, height 5\' 9"  (1.753 m), weight 61.2 kg, SpO2 97 %.  Examination: General appearance: alert, cooperative and no distress Head: Normocephalic, without obvious abnormality, atraumatic Eyes: EOMI Lungs: clear to auscultation bilaterally Heart: regular rate and rhythm and S1, S2 normal Abdomen: normal findings: bowel sounds normal and soft, non-tender Extremities: no edema Skin: mobility and turgor normal Neurologic: LLE 4/5, otherwise 5/5 in remaining; sensation symmetric and normal  Consultants:   Neuro  Procedures:     Data Reviewed: I have personally reviewed following labs and imaging studies Results for orders placed or performed during the hospital encounter of 08/02/20 (from the past 24 hour(s))  Magnesium     Status: None   Collection Time: 08/07/20  2:43 AM  Result Value Ref Range   Magnesium 1.9 1.7 - 2.4 mg/dL  CBC     Status: Abnormal   Collection Time: 08/07/20  2:43 AM  Result Value Ref Range   WBC 5.5 4.0 - 10.5 K/uL   RBC 3.81 (L) 4.22 - 5.81 MIL/uL   Hemoglobin 10.3 (L) 13.0 - 17.0 g/dL   HCT 32.6 (L) 39.0 - 52.0 %   MCV 85.6 80.0 - 100.0 fL   MCH 27.0 26.0 - 34.0 pg   MCHC 31.6 30.0 - 36.0 g/dL   RDW 16.3 (H) 11.5 - 15.5 %   Platelets 118 (L) 150 - 400 K/uL   nRBC 0.0 0.0 - 0.2 %  Basic metabolic panel     Status: Abnormal   Collection Time: 08/07/20  2:43 AM  Result Value Ref Range   Sodium 141 135 - 145 mmol/L   Potassium 3.7 3.5 - 5.1 mmol/L   Chloride 107 98 - 111 mmol/L   CO2 24 22 - 32 mmol/L   Glucose, Bld 107 (H) 70 - 99 mg/dL   BUN 26 (H) 8 - 23 mg/dL   Creatinine, Ser 1.77 (H) 0.61 - 1.24 mg/dL   Calcium 8.4 (L) 8.9 - 10.3 mg/dL   GFR, Estimated 36 (L) >60 mL/min   Anion gap 10 5 - 15    Recent Results (from the past 240 hour(s))  Resp Panel by RT-PCR (Flu A&B, Covid) Nasopharyngeal Swab     Status: None   Collection Time: 08/02/20  2:00 PM   Specimen: Nasopharyngeal Swab;  Nasopharyngeal(NP) swabs in vial transport medium  Result Value Ref Range Status   SARS Coronavirus 2 by RT PCR NEGATIVE NEGATIVE Final    Comment: (NOTE) SARS-CoV-2 target nucleic acids are NOT DETECTED.  The SARS-CoV-2 RNA is generally detectable in upper respiratory specimens during the acute phase of infection. The lowest concentration of SARS-CoV-2 viral copies this assay can detect is 138 copies/mL. A negative result does not preclude SARS-Cov-2 infection and should not be used as the sole basis for treatment or other patient management decisions. A negative result may occur with  improper specimen collection/handling, submission of specimen other than nasopharyngeal swab, presence of viral mutation(s) within the areas targeted by this assay, and inadequate number of viral copies(<138 copies/mL). A negative result must be combined with clinical observations, patient history, and epidemiological information. The expected result is Negative.  Fact Sheet for Patients:  EntrepreneurPulse.com.au  Fact Sheet for Healthcare  Providers:  IncredibleEmployment.be  This test is no t yet approved or cleared by the [place] and  has been authorized for detection and/or diagnosis of SARS-CoV-2 by FDA under an Emergency Use Authorization (EUA). This EUA will remain  in effect (meaning this test can be used) for the duration of the COVID-19 declaration under Section 564(b)(1) of the Act, 21 U.S.C.section 360bbb-3(b)(1), unless the authorization is terminated  or revoked sooner.       Influenza A by PCR NEGATIVE NEGATIVE Final   Influenza B by PCR NEGATIVE NEGATIVE Final    Comment: (NOTE) The Xpert Xpress SARS-CoV-2/FLU/RSV plus assay is intended as an aid in the diagnosis of influenza from Nasopharyngeal swab specimens and should not be used as a sole basis for treatment. Nasal washings and aspirates are unacceptable for Xpert Xpress  SARS-CoV-2/FLU/RSV testing.  Fact Sheet for Patients: EntrepreneurPulse.com.au  Fact Sheet for Healthcare Providers: IncredibleEmployment.be  This test is not yet approved or cleared by the [place] FDA and has been authorized for detection and/or diagnosis of SARS-CoV-2 by FDA under an Emergency Use Authorization (EUA). This EUA will remain in effect (meaning this test can be used) for the duration of the COVID-19 declaration under Section 564(b)(1) of the Act, 21 U.S.C. section 360bbb-3(b)(1), unless the authorization is terminated or revoked.  Performed at [facility], [place] [address], [place], [place] [address]      Radiology Studies: No results found. US RENAL  Final Result    MR BRAIN WO CONTRAST  Final Result    MR ANGIO HEAD WO CONTRAST  Final Result    MR ANGIO NECK W WO CONTRAST  Final Result    CT HEAD WO CONTRAST  Final Result  Addendum 1 of 1  ADDENDUM REPORT: 08/02/2020 15:56    ADDENDUM:  On further review, decreased attenuation is noted in the medial  right temporal-parietal junction region concerning for acute infarct  in this area.      Electronically Signed    By: Lowella Grip III M.D.    On: 08/02/2020 15:56      Final      Scheduled Meds:  apixaban  2.5 mg Oral BID   carvedilol  3.125 mg Oral BID WC   cholecalciferol  1,000 Units Oral Daily   pantoprazole  40 mg Oral Daily   polyethylene glycol  17 g Oral Daily   QUEtiapine  12.5 mg Oral BID   rosuvastatin  40 mg Oral Daily   senna-docusate  1 tablet Oral BID   tamsulosin  0.4 mg Oral Daily   PRN Meds: acetaminophen **OR** acetaminophen (TYLENOL) oral liquid 160 mg/5 mL **OR** acetaminophen, haloperidol lactate, zolpidem Continuous Infusions:   LOS: 5 days  Time spent: Greater than 50% of the 35 minute visit was spent in counseling/coordination of care for the patient as laid out in the A&P.   Dwyane Dee,  MD [facility] 08/07/2020, 6:41 PM

## 2020-08-07 NOTE — Progress Notes (Signed)
MD notified of patient BP of 189/111. MD instructed to keep monitoring the patient for BP of 200 and above. The nurse will continue to monitor.

## 2020-08-07 NOTE — Progress Notes (Signed)
Inpatient Rehab Admissions Coordinator:  Saw pt and daughter, [name], at bedside. Informed them that insurance authorization for potential CIR admission has begun.  Will continue to follow.   Gayland Curry, [place], [place] Admissions Coordinator [phone]

## 2020-08-08 DIAGNOSIS — Z8719 Personal history of other diseases of the digestive system: Secondary | ICD-10-CM

## 2020-08-08 DIAGNOSIS — F05 Delirium due to known physiological condition: Secondary | ICD-10-CM

## 2020-08-08 DIAGNOSIS — E785 Hyperlipidemia, unspecified: Secondary | ICD-10-CM

## 2020-08-08 DIAGNOSIS — N1832 Chronic kidney disease, stage 3b: Secondary | ICD-10-CM

## 2020-08-08 LAB — BASIC METABOLIC PANEL
Anion gap: 12 (ref 5–15)
BUN: 26 mg/dL — ABNORMAL HIGH (ref 8–23)
CO2: 20 mmol/L — ABNORMAL LOW (ref 22–32)
Calcium: 8.3 mg/dL — ABNORMAL LOW (ref 8.9–10.3)
Chloride: 109 mmol/L (ref 98–111)
Creatinine, Ser: 1.75 mg/dL — ABNORMAL HIGH (ref 0.61–1.24)
GFR, Estimated: 37 mL/min — ABNORMAL LOW (ref >60)
Glucose, Bld: 110 mg/dL — ABNORMAL HIGH (ref 70–99)
Potassium: 3.9 mmol/L (ref 3.5–5.1)
Sodium: 141 mmol/L (ref 135–145)

## 2020-08-08 LAB — CBC WITH DIFFERENTIAL/PLATELET
Abs Immature Granulocytes: 0 10*3/uL (ref 0.00–0.07)
Basophils Absolute: 0 10*3/uL (ref 0.0–0.1)
Basophils Relative: 0 %
Eosinophils Absolute: 0 10*3/uL (ref 0.0–0.5)
Eosinophils Relative: 0 %
HCT: 33.6 % — ABNORMAL LOW (ref 39.0–52.0)
Hemoglobin: 10.5 g/dL — ABNORMAL LOW (ref 13.0–17.0)
Lymphocytes Relative: 16 %
Lymphs Abs: 0.9 10*3/uL (ref 0.7–4.0)
MCH: 27.1 pg (ref 26.0–34.0)
MCHC: 31.3 g/dL (ref 30.0–36.0)
MCV: 86.6 fL (ref 80.0–100.0)
Monocytes Absolute: 0.4 10*3/uL (ref 0.1–1.0)
Monocytes Relative: 7 %
Neutro Abs: 4.5 10*3/uL (ref 1.7–7.7)
Neutrophils Relative %: 77 %
Platelets: 129 10*3/uL — ABNORMAL LOW (ref 150–400)
RBC: 3.88 MIL/uL — ABNORMAL LOW (ref 4.22–5.81)
RDW: 16.3 % — ABNORMAL HIGH (ref 11.5–15.5)
WBC: 5.8 10*3/uL (ref 4.0–10.5)
nRBC: 0 % (ref 0.0–0.2)
nRBC: 0 /100 WBC

## 2020-08-08 LAB — MAGNESIUM: Magnesium: 1.8 mg/dL (ref 1.7–2.4)

## 2020-08-08 NOTE — Progress Notes (Addendum)
Inpatient Rehab Admissions Coordinator:   I have no beds available for this patient to admit to CIR today.  Will continue to follow for possible admit pending bed availability, however do note that pt mobilizing at min guard or better, with supervision level goals set by Dr. Posey Pronto on 12/16.  He could potentially discharge home with 24/7 supervision, as he is close to goal level from a CIR perspective.   Shann Medal, PT, DPT Admissions Coordinator [phone] 08/08/20  11:26 AM

## 2020-08-08 NOTE — Assessment & Plan Note (Addendum)
EGD 2019 shows nonbleeding gastric ulcer with visible blood vessel. Treated with cautery. No further bleeding since then. Patient is not on anticoagulation since then. Discussed with GI. -Has tolerated Eliquis with stable hemoglobin and vitals Continue PPI.

## 2020-08-08 NOTE — Progress Notes (Signed)
Physical Therapy Treatment Patient Details Name: EBRAHIM BELMAN MRN: [id number] DOB: 1930/03/10 Today's Date: 08/08/2020    History of Present Illness Pt is an 86 y.o. male with a hx of sciatica, pre-diabetes, pericardial effusion, bilat atherosclerotic renal artery stenosis, paroxysmal a-fib, HTN, CKD stage 3B, AAA, GI bleed, HLD, and chronic anemia who presented with partial vision loss in both eyes, lightheadedness, slurred speech, slight L-sided weakness, and confusion. CT was negative, but MRI revealed moderate R side PCA acute CVA involving temporo-occipital lobes along with small acute cortical infarcts of L frontal lobe and L parieto-occipital junction and MRA showed occlusion of R P2 PCA. NIHSS = 2. No tPA administered.    PT Comments    Pt progressing with ambulation distance, ambulated 300' with RW and min A, with periods of min-guard A. Pt attempted quad cane first and was not steady with device and was agreeable to using RW. Daughter supportive of this as well. Worked on path finding around unit and other cognitive tasks while ambulating. Vc's for posture throughout. PT will continue to follow.    Follow Up Recommendations  CIR;Supervision/Assistance - 24 hour     Equipment Recommendations  None recommended by PT    Recommendations for Other Services Rehab consult     Precautions / Restrictions Precautions Precautions: Fall Precaution Comments: HOH; monitor HR and BP Restrictions Weight Bearing Restrictions: No    Mobility  Bed Mobility Overal bed mobility: Needs Assistance Bed Mobility: Supine to Sit     Supine to sit: Supervision Sit to supine: Supervision   General bed mobility comments: pt OOB in recliner at start and end of session  Transfers Overall transfer level: Needs assistance Equipment used: Rolling walker (2 wheeled) Transfers: Sit to/from Stand Sit to Stand: Min guard         General transfer comment: min guard from recliner and toilet,  cues for hand placement when ascending from toilet  Ambulation/Gait Ambulation/Gait assistance: Min assist;Min guard   Assistive device: Rolling walker (2 wheeled);Quad cane Gait Pattern/deviations: Decreased stride length;Step-through pattern;Trunk flexed;Narrow base of support Gait velocity: reduced   General Gait Details: VSS with ambulation and pt expressed desire to keep walking. Worked on path finding. Pt was able to return to his room. Began ambulation with quad cane but pt unsafe with this so switched to RW. Pt close supervision with RW.   Stairs             Wheelchair Mobility    Modified Rankin (Stroke Patients Only) Modified Rankin (Stroke Patients Only) Pre-Morbid Rankin Score: No significant disability Modified Rankin: Moderately severe disability     Balance Overall balance assessment: Needs assistance Sitting-balance support: Feet supported;Single extremity supported Sitting balance-Leahy Scale: Good Sitting balance - Comments: pt able to wt shift in sitting without LOB Postural control: Right lateral lean Standing balance support: Single extremity supported;During functional activity Standing balance-Leahy Scale: Poor Standing balance comment: at least one UE supported during dynamic reaching tasks at sink                            Cognition Arousal/Alertness: Awake/alert Behavior During Therapy: WFL for tasks assessed/performed;Flat affect Overall Cognitive Status: Impaired/Different from baseline Area of Impairment: Safety/judgement;Awareness;Problem solving;Following commands                 Orientation Level: Disoriented to;Place Current Attention Level: Selective Memory: Decreased short-term memory Following Commands: Follows one step commands consistently;Follows multi-step commands with increased time  Safety/Judgement: Decreased awareness of deficits Awareness: Emergent Problem Solving: Slow processing;Requires verbal  cues General Comments: pt very appropriate during session, asking appropriate questions concerning DC plan. noted to be slow to process but following all commands      Exercises General Exercises - Lower Extremity Ankle Circles/Pumps: AROM;Both;15 reps;Seated Long Arc Quad: AROM;Both;15 reps;Seated Heel Slides: AROM;Both;15 reps;Seated Hip ABduction/ADduction: AROM;Both;15 reps;10 reps;Seated (abduction 15x, adduction with pillow sqeeze for 5 seconds 10x) Hip Flexion/Marching: AROM;Both;15 reps;Seated Other Exercises Other Exercises: LAQ from recliner BLE x10 reps    General Comments General comments (skin integrity, edema, etc.): HR max 98 bpm during mobility      Pertinent Vitals/Pain Pain Assessment: No/denies pain    Home Living                      Prior Function            PT Goals (current goals can now be found in the care plan section) Acute Rehab PT Goals Patient Stated Goal: to go home PT Goal Formulation: With patient/family Time For Goal Achievement: 08/18/20 Potential to Achieve Goals: Good    Frequency    Min 4X/week      PT Plan Current plan remains appropriate    Co-evaluation              AM-PAC PT "6 Clicks" Mobility   Outcome Measure  Help needed turning from your back to your side while in a flat bed without using bedrails?: A Little Help needed moving from lying on your back to sitting on the side of a flat bed without using bedrails?: A Little Help needed moving to and from a bed to a chair (including a wheelchair)?: A Little Help needed standing up from a chair using your arms (e.g., wheelchair or bedside chair)?: A Little Help needed to walk in hospital room?: A Little Help needed climbing 3-5 steps with a railing? : A Lot 6 Click Score: 17    End of Session Equipment Utilized During Treatment: Gait belt Activity Tolerance: Patient tolerated treatment well Patient left: with call bell/phone within reach;with  family/visitor present;in chair;with chair alarm set Nurse Communication: Mobility status PT Visit Diagnosis: Unsteadiness on feet (R26.81);Other abnormalities of gait and mobility (R26.89);Muscle weakness (generalized) (M62.81);Difficulty in walking, not elsewhere classified (R26.2);Other symptoms and signs involving the nervous system (R29.898)     Time:  -     Charges:                        Leighton Roach, [place]  Pager [phone] Office [place] 08/08/2020, 5:18 PM

## 2020-08-08 NOTE — Assessment & Plan Note (Signed)
-  Patient converted spontaneously back to normal sinus rhythm.  Now rate controlled -Continue Eliquis, see discussion above -Hemoglobin remained stable -Continue Coreg

## 2020-08-08 NOTE — Assessment & Plan Note (Addendum)
-   per MRI brain: "Moderate size right PCA territory acute infarction involving temporo-occipital lobes. Occlusion of the right P2 PCA. Small acute cortical infarcts of the left frontal lobe and left parieto-occipital junction." -Echo: GE<72%; diastolic function unable to be evaluated -PT/OT recommend CIR; patient has been improving steadily since admission and by the time CIR gets bed, may be too high functioning; patient's daughter and the situation would want him to probably go to an alternative rehab facility as she states thresholds in the house are not wide enough for a walker so he would need to no longer require a walker prior to going home; details of discussion sent to case management to initiate process -Patient now on Eliquis in setting of presumed embolic etiology from underlying A. Fib (anticoagulation was discussed with GI and vascular surgery prior to initiation) - patient and family have accepted bed offer at [facility]

## 2020-08-08 NOTE — Progress Notes (Signed)
Occupational Therapy Treatment Patient Details Name: Chris Peterson MRN: [id number] DOB: 07/12/1930 Today's Date: 08/08/2020    History of present illness Pt is an 86 y.o. male with a hx of sciatica, pre-diabetes, pericardial effusion, bilat atherosclerotic renal artery stenosis, paroxysmal a-fib, HTN, CKD stage 3B, AAA, GI bleed, HLD, and chronic anemia who presented with partial vision loss in both eyes, lightheadedness, slurred speech, slight L-sided weakness, and confusion. CT was negative, but MRI revealed moderate R side PCA acute CVA involving temporo-occipital lobes along with small acute cortical infarcts of L frontal lobe and L parieto-occipital junction and MRA showed occlusion of R P2 PCA. NIHSS = 2. No tPA administered.   OT comments  Pt making steady progress towards OT goals this session. Session focus on BADL reeducation, functional mobility and higher level balance tasks. Overall, pt requires up to MIN A for functional mobility greater than a household distance with RW. Pt able to step over obstacles in hallway with MIN A for balance. Pt completed toileting supervision- min guard with RW, as well as UB grooming tasks at sink with min guard. Noted decreased interaction with L visual field as pt required cues to locate soap at sink on L side of sink and passed pts room on L side of hall. Pt would continue to benefit from skilled occupational therapy while admitted and after d/c to address the below listed limitations in order to improve overall functional mobility and facilitate independence with BADL participation. DC plan remains appropriate, will follow acutely per POC.     Follow Up Recommendations  CIR    Equipment Recommendations  3 in 1 bedside commode    Recommendations for Other Services      Precautions / Restrictions Precautions Precautions: Fall Precaution Comments: HOH; monitor HR and BP Restrictions Weight Bearing Restrictions: No       Mobility Bed  Mobility               General bed mobility comments: pt OOB in recliner at start and end of session  Transfers Overall transfer level: Needs assistance Equipment used: Rolling walker (2 wheeled) Transfers: Sit to/from Stand Sit to Stand: Min guard         General transfer comment: min guard from recliner and toilet, cues for hand placement when ascending from toilet    Balance Overall balance assessment: Needs assistance Sitting-balance support: Feet supported;Single extremity supported Sitting balance-Leahy Scale: Good     Standing balance support: Single extremity supported;During functional activity Standing balance-Leahy Scale: Poor Standing balance comment: at least one UE supported during dynamic reaching tasks at sink                           ADL either performed or assessed with clinical judgement   ADL Overall ADL's : Needs assistance/impaired     Grooming: Oral care;Standing;Min guard;Wash/dry hands Grooming Details (indicate cue type and reason): min guard for balance with RW at sink; no issues opening items or brushing dentures, cues to locate Soap on L side of counter                 Toilet Transfer: Garment/textile technologist Details (indicate cue type and reason): min guard for safety with RW, cues to use grab bars for safe descent Toileting- Clothing Manipulation and Hygiene: Supervision/safety;Sitting/lateral lean       Functional mobility during ADLs: Minimal assistance;Min guard;Rolling walker General ADL Comments: pt able to complete toileting,  standing UB ADLS and functional mobility with up to MIN A for balance with RW     Vision Baseline Vision/History: Wears glasses Wears Glasses: At all times Vision Assessment?: Yes Eye Alignment: Within Functional Limits Ocular Range of Motion: Restricted on the left Alignment/Gaze Preference: Within Defined Limits Tracking/Visual Pursuits: Decreased  smoothness of eye movement to LEFT superior field;Requires cues, head turns, or add eye shifts to track;Unable to hold eye position out of midline Saccades: Additional head turns occurred during testing;Additional eye shifts occurred during testing;Decreased speed of saccadic movement Visual Fields: Left visual field deficit Additional Comments: continue to further assess however decreased interaction to L environment noted during ADL and mobility tasks   Perception     Praxis      Cognition Arousal/Alertness: Awake/alert Behavior During Therapy: WFL for tasks assessed/performed;Flat affect Overall Cognitive Status: Impaired/Different from baseline Area of Impairment: Safety/judgement;Awareness;Problem solving;Following commands                       Following Commands: Follows one step commands consistently;Follows multi-step commands with increased time Safety/Judgement: Decreased awareness of deficits Awareness: Emergent Problem Solving: Slow processing;Requires verbal cues General Comments: pt very appropriate during session, asking appropriate questions concerning DC plan. noted to be slow to process but following all commands        Exercises Other Exercises Other Exercises: LAQ from recliner BLE x10 reps   Shoulder Instructions       General Comments HR max 98 bpm during mobility    Pertinent Vitals/ Pain       Pain Assessment: No/denies pain  Home Living                                          Prior Functioning/Environment              Frequency  Min 2X/week        Progress Toward Goals  OT Goals(current goals can now be found in the care plan section)  Progress towards OT goals: Progressing toward goals  Acute Rehab OT Goals Patient Stated Goal: to go home OT Goal Formulation: With patient/family Time For Goal Achievement: 08/18/20 Potential to Achieve Goals: Good  Plan Discharge plan remains appropriate;Frequency  remains appropriate    Co-evaluation                 AM-PAC OT "6 Clicks" Daily Activity     Outcome Measure   Help from another person eating meals?: None Help from another person taking care of personal grooming?: A Little Help from another person toileting, which includes using toliet, bedpan, or urinal?: A Little Help from another person bathing (including washing, rinsing, drying)?: A Little Help from another person to put on and taking off regular upper body clothing?: A Little Help from another person to put on and taking off regular lower body clothing?: A Little 6 Click Score: 19    End of Session Equipment Utilized During Treatment: Rolling walker  OT Visit Diagnosis: Unsteadiness on feet (R26.81);Other abnormalities of gait and mobility (R26.89);Muscle weakness (generalized) (M62.81);Other symptoms and signs involving cognitive function;Low vision, both eyes (H54.2)   Activity Tolerance Patient tolerated treatment well   Patient Left in chair;with call bell/phone within reach;with chair alarm set   Nurse Communication Mobility status        Time: [id number] OT Time Calculation (min): 27 min  Charges: OT General Charges $OT Visit: 1 Visit OT Treatments $Self Care/Home Management : 8-22 mins $Therapeutic Activity: 8-22 mins  Lanier Clam., COTA/L Acute Rehabilitation Services [phone] [place] 08/08/2020, 3:27 PM

## 2020-08-08 NOTE — Care Management Important Message (Signed)
Important Message  Patient Details  Name: ADRAIN NESBIT MRN: [id number] Date of Birth: 04-27-1930   Medicare Important Message Given:  Yes     Natasha Paulson 08/08/2020, 3:03 PM

## 2020-08-08 NOTE — Assessment & Plan Note (Addendum)
-  Baseline creatinine approximately 1.3-1.5 -Normal renal ultrasound -Patient also voiding well with normal urine output -Previously on Lasix and lisinopril outpatient (on hold for now): Not resumed at discharge

## 2020-08-08 NOTE — Assessment & Plan Note (Signed)
-  Continue Coreg and Crestor

## 2020-08-08 NOTE — Assessment & Plan Note (Signed)
Continue Crestor 

## 2020-08-08 NOTE — Assessment & Plan Note (Signed)
-  Now on nicotine patch.  Patient willing to quit

## 2020-08-08 NOTE — Progress Notes (Signed)
  Speech Language Pathology Treatment: Cognitive-Linquistic  Patient Details Name: Chris Peterson MRN: [id number] DOB: [birth year] Today's Date: 08/08/2020 Time: 1520-1550 SLP Time Calculation (min) (ACUTE ONLY): 30 min  Assessment / Plan / Recommendation Clinical Impression  Pt was received seated in recliner with daughter at bedside.  When asked how he is progressing towards his memory and attention goals, pt reported "I don't think it's a problem with my memory.  I just can't hear what everyone is saying with these masks on."  SLP provided skilled education regarding the relationship between hearing and storage and retrieval of information and encouraged pt to continue advocating for himself when he can not hear what people are saying.  SLP also provided skilled education regarding distraction management techniques and memory compensatory strategies.  A handout was provided to maximize carryover of information in between therapy sessions.  Pt's verbalized understanding and reports that he feels he is near his cognitive baseline; however, his daughter expressed ongoing concerns regarding pt's increased confusion during the evenings.  All questions were answered to pt's and daughter's satisfaction at this time.  Continue per current plan of care.    HPI HPI: Pt is an 86 y.o. male with a hx of sciatica, pre-diabetes, pericardial effusion, bilat atherosclerotic renal artery stenosis, paroxysmal a-fib, HTN, CKD stage 3B, AAA, GI bleed, HLD, and chronic anemia who presented with partial vision loss in both eyes, lightheadedness, slurred speech, slight L-sided weakness, and confusion. CT was negative, but MRI revealed moderate R side PCA acute CVA involving temporo-occipital lobes along with small acute cortical infarcts of L frontal lobe and L parieto-occipital junction and MRA showed occlusion of R P2 PCA. NIHSS = 2. No tPA administered.      SLP Plan  Continue with current plan of care        Recommendations                   Follow up Recommendations: Inpatient Rehab SLP Visit Diagnosis: Cognitive communication deficit (R67.893) Plan: Continue with current plan of care       GO                Chris Peterson, Selinda Orion 08/08/2020, 3:56 PM

## 2020-08-08 NOTE — Progress Notes (Signed)
PROGRESS NOTE    ARMARI POINT   [id number]  DOB: 1930/07/27  DOA: 08/02/2020     6  PCP: Lawerance Cruel, MD  CC: weakness  Hospital Course: Chris Peterson is an 86 yo male with PMH A. fib not on anticoagulation secondary to GI bleed in past, CKD 3B, AAA, HLD, chronic anemia who presented with left-sided weakness.  After work-up he was found to have a moderate sized right PCA infarct involving the temporooccipital lobes and occlusion of the right P2 PCA.  There were also small acute cortical infarcts involving the left frontal lobe and left parieto-occipital junction.   Interval History:  No events overnight.  He had no sundowning last night and was sitting comfortably in his recliner bedside with daughter present this afternoon.  Endorses that his strength continues to improve.  He has been eating/drinking well.  Also voiding and having regular bowel movements.  Understands plan is for waiting for CIR bed however if denied due to ongoing physical improvement, daughter is wanting for him to go to rehab somewhere.  Old records reviewed in assessment of this patient  ROS: Constitutional: negative for chills and fevers, Respiratory: negative for cough, Cardiovascular: negative for chest pain and Gastrointestinal: negative for abdominal pain  Assessment & Plan: * CVA (cerebral vascular accident) ([place]) - per MRI brain: "Moderate size right PCA territory acute infarction involving temporo-occipital lobes. Occlusion of the right P2 PCA. Small acute cortical infarcts of the left frontal lobe and left parieto-occipital junction." -Echo: [id number]; diastolic function unable to be evaluated -PT/OT recommend CIR; patient has been improving steadily since admission and by the time CIR gets bed, may be too high functioning; patient's daughter and the situation would want him to probably go to an alternative rehab facility as she states thresholds in the house are not wide enough for a walker so  he would need to no longer require a walker prior to going home; details of discussion sent to case management to initiate process -Patient now on Eliquis in setting of presumed embolic etiology from underlying A. Fib (anticoagulation was discussed with GI and vascular surgery prior to initiation)  PAF (paroxysmal atrial fibrillation) ([place]) -Patient converted spontaneously back to normal sinus rhythm.  Now rate controlled -Continue Eliquis, see discussion above -Hemoglobin remained stable -Continue Coreg  Acute renal failure superimposed on stage 3b chronic kidney disease ([place]) -Baseline creatinine approximately 1.3-1.5 -Normal renal ultrasound -Patient also voiding well with normal urine output -Previously on Lasix and lisinopril outpatient  Sundowning -Continue Seroquel -Continue as needed Haldol  History of GI bleed EGD 2019 shows nonbleeding gastric ulcer with visible blood vessel. Treated with cautery. No further bleeding since then. Patient is not on anticoagulation since then. Discussed with GI. Currently plan is to initiate anticoagulation and monitor. Appreciate assistance. Continue PPI.  HLD (hyperlipidemia) -Continue Crestor  AAA (abdominal aortic aneurysm) without rupture ([facility]) -Continue Coreg and Crestor  Tobacco abuse -Now on nicotine patch.  Patient willing to quit   Antimicrobials:   DVT prophylaxis: Eliquis Code Status: Full Family Communication: Daughter bedside Disposition Plan: Status is: Inpatient  Remains inpatient appropriate because:Inpatient level of care appropriate due to severity of illness   Dispo: The patient is from: Home              Anticipated d/c is to: CIR              Anticipated d/c date is: 2 days  Patient currently is medically stable to d/c.   Objective: Blood pressure (!) 148/76, pulse 75, temperature 98.1 F (36.7 C), temperature source Oral, resp. rate 18, height 5\' 9"  (1.753 m), weight 61.2 kg, SpO2 97  %.  Examination: General appearance: alert, cooperative and no distress Head: Normocephalic, without obvious abnormality, atraumatic Eyes: EOMI Lungs: clear to auscultation bilaterally Heart: regular rate and rhythm and S1, S2 normal Abdomen: normal findings: bowel sounds normal and soft, non-tender Extremities: no edema Skin: mobility and turgor normal Neurologic: LLE 4/5, otherwise 5/5 in remaining; sensation symmetric and normal  Consultants:   Neuro  Procedures:     Data Reviewed: I have personally reviewed following labs and imaging studies Results for orders placed or performed during the hospital encounter of 08/02/20 (from the past 24 hour(s))  Magnesium     Status: None   Collection Time: 08/08/20  2:19 AM  Result Value Ref Range   Magnesium 1.8 1.7 - 2.4 mg/dL  Basic metabolic panel     Status: Abnormal   Collection Time: 08/08/20  2:19 AM  Result Value Ref Range   Sodium 141 135 - 145 mmol/L   Potassium 3.9 3.5 - 5.1 mmol/L   Chloride 109 98 - 111 mmol/L   CO2 20 (L) 22 - 32 mmol/L   Glucose, Bld 110 (H) 70 - 99 mg/dL   BUN 26 (H) 8 - 23 mg/dL   Creatinine, Ser 1.75 (H) 0.61 - 1.24 mg/dL   Calcium 8.3 (L) 8.9 - 10.3 mg/dL   GFR, Estimated 37 (L) >60 mL/min   Anion gap 12 5 - 15    Recent Results (from the past 240 hour(s))  Resp Panel by RT-PCR (Flu A&B, Covid) Nasopharyngeal Swab     Status: None   Collection Time: 08/02/20  2:00 PM   Specimen: Nasopharyngeal Swab; Nasopharyngeal(NP) swabs in vial transport medium  Result Value Ref Range Status   SARS Coronavirus 2 by RT PCR NEGATIVE NEGATIVE Final    Comment: (NOTE) SARS-CoV-2 target nucleic acids are NOT DETECTED.  The SARS-CoV-2 RNA is generally detectable in upper respiratory specimens during the acute phase of infection. The lowest concentration of SARS-CoV-2 viral copies this assay can detect is 138 copies/mL. A negative result does not preclude SARS-Cov-2 infection and should not be used as the  sole basis for treatment or other patient management decisions. A negative result may occur with  improper specimen collection/handling, submission of specimen other than nasopharyngeal swab, presence of viral mutation(s) within the areas targeted by this assay, and inadequate number of viral copies(<138 copies/mL). A negative result must be combined with clinical observations, patient history, and epidemiological information. The expected result is Negative.  Fact Sheet for Patients:  EntrepreneurPulse.com.au  Fact Sheet for Healthcare Providers:  IncredibleEmployment.be  This test is no t yet approved or cleared by the [place] FDA and  has been authorized for detection and/or diagnosis of SARS-CoV-2 by FDA under an Emergency Use Authorization (EUA). This EUA will remain  in effect (meaning this test can be used) for the duration of the COVID-19 declaration under Section 564(b)(1) of the Act, 21 U.S.C.section 360bbb-3(b)(1), unless the authorization is terminated  or revoked sooner.       Influenza A by PCR NEGATIVE NEGATIVE Final   Influenza B by PCR NEGATIVE NEGATIVE Final    Comment: (NOTE) The Xpert Xpress SARS-CoV-2/FLU/RSV plus assay is intended as an aid in the diagnosis of influenza from Nasopharyngeal swab specimens and should not be used as  a sole basis for treatment. Nasal washings and aspirates are unacceptable for Xpert Xpress SARS-CoV-2/FLU/RSV testing.  Fact Sheet for Patients: EntrepreneurPulse.com.au  Fact Sheet for Healthcare Providers: IncredibleEmployment.be  This test is not yet approved or cleared by the [place] FDA and has been authorized for detection and/or diagnosis of SARS-CoV-2 by FDA under an Emergency Use Authorization (EUA). This EUA will remain in effect (meaning this test can be used) for the duration of the COVID-19 declaration under Section 564(b)(1) of the  Act, 21 U.S.C. section 360bbb-3(b)(1), unless the authorization is terminated or revoked.  Performed at [facility], [place] [address] [place], [place] [address]      Radiology Studies: No results found. US RENAL  Final Result    MR BRAIN WO CONTRAST  Final Result    MR ANGIO HEAD WO CONTRAST  Final Result    MR ANGIO NECK W WO CONTRAST  Final Result    CT HEAD WO CONTRAST  Final Result  Addendum 1 of 1  ADDENDUM REPORT: 08/02/2020 15:56    ADDENDUM:  On further review, decreased attenuation is noted in the medial  right temporal-parietal junction region concerning for acute infarct  in this area.      Electronically Signed    By: Lowella Grip III M.D.    On: 08/02/2020 15:56      Final      Scheduled Meds: . apixaban  2.5 mg Oral BID  . carvedilol  3.125 mg Oral BID WC  . cholecalciferol  1,000 Units Oral Daily  . pantoprazole  40 mg Oral Daily  . polyethylene glycol  17 g Oral Daily  . QUEtiapine  12.5 mg Oral BID  . rosuvastatin  40 mg Oral Daily  . senna-docusate  1 tablet Oral BID  . tamsulosin  0.4 mg Oral Daily   PRN Meds: acetaminophen **OR** acetaminophen (TYLENOL) oral liquid 160 mg/5 mL **OR** acetaminophen, haloperidol lactate, zolpidem Continuous Infusions:   LOS: 6 days  Time spent: Greater than 50% of the 35 minute visit was spent in counseling/coordination of care for the patient as laid out in the A&P.   Chris Dee, MD [facility] 08/08/2020, 4:52 PM

## 2020-08-08 NOTE — Assessment & Plan Note (Addendum)
-  Continue Seroquel -If remains stable at rehab with no further sundowning, could consider discontinuing Seroquel when he goes home from rehab

## 2020-08-09 LAB — CBC WITH DIFFERENTIAL/PLATELET
Abs Immature Granulocytes: 0.44 10*3/uL — ABNORMAL HIGH (ref 0.00–0.07)
Basophils Absolute: 0 10*3/uL (ref 0.0–0.1)
Basophils Relative: 0 %
Eosinophils Absolute: 0 10*3/uL (ref 0.0–0.5)
Eosinophils Relative: 1 %
HCT: 33 % — ABNORMAL LOW (ref 39.0–52.0)
Hemoglobin: 10.8 g/dL — ABNORMAL LOW (ref 13.0–17.0)
Immature Granulocytes: 7 %
Lymphocytes Relative: 25 %
Lymphs Abs: 1.6 10*3/uL (ref 0.7–4.0)
MCH: 27.8 pg (ref 26.0–34.0)
MCHC: 32.7 g/dL (ref 30.0–36.0)
MCV: 84.8 fL (ref 80.0–100.0)
Monocytes Absolute: 1.1 10*3/uL — ABNORMAL HIGH (ref 0.1–1.0)
Monocytes Relative: 17 %
Neutro Abs: 3.4 10*3/uL (ref 1.7–7.7)
Neutrophils Relative %: 50 %
Platelets: 137 10*3/uL — ABNORMAL LOW (ref 150–400)
RBC: 3.89 MIL/uL — ABNORMAL LOW (ref 4.22–5.81)
RDW: 16.3 % — ABNORMAL HIGH (ref 11.5–15.5)
WBC: 6.6 10*3/uL (ref 4.0–10.5)
nRBC: 0 % (ref 0.0–0.2)

## 2020-08-09 LAB — BASIC METABOLIC PANEL
Anion gap: 10 (ref 5–15)
BUN: 22 mg/dL (ref 8–23)
CO2: 20 mmol/L — ABNORMAL LOW (ref 22–32)
Calcium: 8.4 mg/dL — ABNORMAL LOW (ref 8.9–10.3)
Chloride: 110 mmol/L (ref 98–111)
Creatinine, Ser: 1.74 mg/dL — ABNORMAL HIGH (ref 0.61–1.24)
GFR, Estimated: 37 mL/min — ABNORMAL LOW (ref >60)
Glucose, Bld: 94 mg/dL (ref 70–99)
Potassium: 4 mmol/L (ref 3.5–5.1)
Sodium: 140 mmol/L (ref 135–145)

## 2020-08-09 LAB — MAGNESIUM: Magnesium: 1.9 mg/dL (ref 1.7–2.4)

## 2020-08-09 MED ORDER — CARVEDILOL 6.25 MG PO TABS
6.2500 mg | ORAL_TABLET | Freq: Two times a day (BID) | ORAL | Status: DC
  Administered 2020-08-09 (×2): 6.25 mg via ORAL
  Filled 2020-08-09 (×2): qty 1

## 2020-08-09 NOTE — Discharge Instructions (Signed)

## 2020-08-09 NOTE — Progress Notes (Signed)
Inpatient Rehab Admissions Coordinator:   Spoke with pt's daughter over the phone to discuss rehab needs. Wells Guiles states that pt is having a terrible day, with increased weakness and lethargy.  Reports it took 2 people to transfer to [facility] and recliner.  We discussed that previous therapy documentation has pt mobilizing close to CIR goal level and that there would be challenges placing him to SNF from CIR if family still felt like they could not care for him at home at supervision level.  Wells Guiles states that she and her sister cannot provide 24/7 supervision, but that maybe they could hire a caregiver.  I reiterated that CIR recommendations would be 24/7 supervision, and that would not change.  She does appear open to SNF if CIR is not an option.  I let her know we would wait for therapy to see pt today and make a decision at that point.    Shann Medal, PT, DPT Admissions Coordinator [phone] 08/09/20  1:03 PM

## 2020-08-09 NOTE — Progress Notes (Signed)
Physical Therapy Treatment Patient Details Name: Chris Peterson MRN: [id number] DOB: June 24, 1930 Today's Date: 08/09/2020    History of Present Illness Pt is an 86 y.o. male with a hx of sciatica, pre-diabetes, pericardial effusion, bilat atherosclerotic renal artery stenosis, paroxysmal a-fib, HTN, CKD stage 3B, AAA, GI bleed, HLD, and chronic anemia who presented with partial vision loss in both eyes, lightheadedness, slurred speech, slight L-sided weakness, and confusion. CT was negative, but MRI revealed moderate R side PCA acute CVA involving temporo-occipital lobes along with small acute cortical infarcts of L frontal lobe and L parieto-occipital junction and MRA showed occlusion of R P2 PCA. NIHSS = 2. No tPA administered.    PT Comments    Pt in bed on arrival and agreeable to participate with therapy. Daughter also present and reports pt has not been feeling well today, she states that he has been weak, fatigued, and dizzy. Pt required additional assist with bed mobilities today with bed flat and bed rails lowered to simulate home environment. He performed sit<>stand from recliner chair 3 different ways to promote weight shift to the LLE. Pt did require min A to rise when RLE was placed at a disadvantage. Pt ambulated in hallway with flexed trunk and decreased step length. He increased step length with step counting challenge. Pt will need 24/7 assist/supervision for safety at d/c. Current plan remains appropriate.    Follow Up Recommendations  CIR;Supervision/Assistance - 24 hour     Equipment Recommendations  None recommended by PT    Recommendations for Other Services       Precautions / Restrictions Precautions Precautions: Fall Precaution Comments: HOH; monitor HR and BP Restrictions Weight Bearing Restrictions: No    Mobility  Bed Mobility Overal bed mobility: Needs Assistance Bed Mobility: Supine to Sit     Supine to sit: Min assist     General bed mobility  comments: bed placed flat with rails lowered to simulate home enviroment. Pt grasping for railing to assist in trunk elevation and required min A to elevate trunk. Increased time and effort required.  Transfers Overall transfer level: Needs assistance Equipment used: Rolling walker (2 wheeled) Transfers: Sit to/from Stand Sit to Stand: Min guard         General transfer comment: close min guard for safety. cues for safe hand placement with RW.  Ambulation/Gait Ambulation/Gait assistance: Min guard Gait Distance (Feet): 120 Feet Assistive device: Rolling walker (2 wheeled) Gait Pattern/deviations: Decreased stride length;Step-through pattern;Trunk flexed;Narrow base of support Gait velocity: reduced Gait velocity interpretation: <1.8 ft/sec, indicate of risk for recurrent falls General Gait Details: Pt ambulated in hallway with min guard for safety. pt with decreased step length. Able to increase with challange. Pt walked ~30 in 37 steps and when challanged to do the same distance in fewer steps he decreased steps to 25.   Stairs             Wheelchair Mobility    Modified Rankin (Stroke Patients Only) Modified Rankin (Stroke Patients Only) Pre-Morbid Rankin Score: No significant disability Modified Rankin: Moderately severe disability     Balance Overall balance assessment: Needs assistance Sitting-balance support: Feet supported;Single extremity supported Sitting balance-Leahy Scale: Good Sitting balance - Comments: pt able to wt shift in sitting without LOB   Standing balance support: Single extremity supported;During functional activity Standing balance-Leahy Scale: Poor Standing balance comment: requires UE support  Cognition Arousal/Alertness: Awake/alert Behavior During Therapy: WFL for tasks assessed/performed;Flat affect Overall Cognitive Status: Impaired/Different from baseline Area of Impairment: Memory;Following  commands;Safety/judgement                     Memory: Decreased short-term memory Following Commands: Follows one step commands consistently;Follows multi-step commands with increased time Safety/Judgement: Decreased awareness of deficits Awareness: Emergent Problem Solving: Slow processing;Requires verbal cues General Comments: pt very appropriate during session, asking appropriate questions concerning DC plan. noted to be slow to process but following all commands      Exercises Other Exercises Other Exercises: sit<>stand 15x 3 different ways. 5x normal, 5x R LE to the side, 5x R LE forward to encourage use of LLE. Verbal and tactile cues required to fully extend LLE in standing.    General Comments General comments (skin integrity, edema, etc.): HR max 98 bpm during mobility      Pertinent Vitals/Pain Pain Assessment: Faces Faces Pain Scale: Hurts a little bit Pain Location: R Shoulder Pain Descriptors / Indicators: Aching;Constant Pain Intervention(s): Monitored during session;Limited activity within patient's tolerance;Repositioned    Home Living                      Prior Function            PT Goals (current goals can now be found in the care plan section) Acute Rehab PT Goals Patient Stated Goal: to go home PT Goal Formulation: With patient/family Time For Goal Achievement: 08/18/20 Potential to Achieve Goals: Good Progress towards PT goals: Progressing toward goals    Frequency    Min 4X/week      PT Plan Current plan remains appropriate    Co-evaluation              AM-PAC PT "6 Clicks" Mobility   Outcome Measure  Help needed turning from your back to your side while in a flat bed without using bedrails?: A Little Help needed moving from lying on your back to sitting on the side of a flat bed without using bedrails?: A Little Help needed moving to and from a bed to a chair (including a wheelchair)?: A Little Help needed  standing up from a chair using your arms (e.g., wheelchair or bedside chair)?: A Little Help needed to walk in hospital room?: A Little Help needed climbing 3-5 steps with a railing? : A Lot 6 Click Score: 17    End of Session Equipment Utilized During Treatment: Gait belt Activity Tolerance: Patient tolerated treatment well Patient left: with call bell/phone within reach;with family/visitor present;in chair Nurse Communication: Mobility status PT Visit Diagnosis: Unsteadiness on feet (R26.81);Other abnormalities of gait and mobility (R26.89);Muscle weakness (generalized) (M62.81);Difficulty in walking, not elsewhere classified (R26.2);Other symptoms and signs involving the nervous system (R29.898)     Time: [id number] PT Time Calculation (min) (ACUTE ONLY): 28 min  Charges:  $Neuromuscular Re-education: 23-37 mins                     Benjiman Core, [place] Pager [phone] Acute Rehab   Allena Katz 08/09/2020, 1:41 PM

## 2020-08-09 NOTE — Progress Notes (Signed)
OT Cancellation Note  Patient Details Name: ROSENDO COUSER MRN: [id number] DOB: 1929-12-04   Cancelled Treatment:    Reason Eval/Treat Not Completed: Other (comment) patient with another discipline at this time. OT to check back as time allows.   Gloris Manchester OTR/L Supplemental OT, Department of rehab services [phone]  Iliyana Convey R H. 08/09/2020, 1:09 PM

## 2020-08-09 NOTE — Assessment & Plan Note (Addendum)
-  Lisinopril on hold for now in setting of elevated creatinine -Continue Coreg and will dose adjust as needed - adding amlodipine  -BP more stable.  May need further adjustment

## 2020-08-09 NOTE — Progress Notes (Signed)
Inpatient Rehab Admissions Coordinator:   I have no beds available for this patient to admit to CIR today.  Pt continues to make good progress with acute PT and appears to be approaching CIR goal level of supervision (per consult note on 12/16).   Shann Medal, PT, DPT Admissions Coordinator [phone] 08/09/20  10:01 AM

## 2020-08-09 NOTE — Progress Notes (Signed)
Notified on call MD of BP systolic >945.  No new orders at this time. Continue to monitor and notify MD if systolic 038 or greater.

## 2020-08-09 NOTE — Progress Notes (Signed)
PROGRESS NOTE    LEXANDER ZIRK   [id number]  DOB: May 25, 1930  DOA: 08/02/2020     7  PCP: Lawerance Cruel, MD  CC: weakness  Hospital Course: Mr. Schreckengost is an 86 yo male with PMH A. fib not on anticoagulation secondary to GI bleed in past, CKD 3B, AAA, HLD, chronic anemia who presented with left-sided weakness.  After work-up he was found to have a moderate sized right PCA infarct involving the temporooccipital lobes and occlusion of the right P2 PCA.  There were also small acute cortical infarcts involving the left frontal lobe and left parieto-occipital junction.  Interval History:  Daughter present bedside this morning.  She states that he is a little "off" this morning.  He is tired and weak.  He also did not sleep well last night and may have had some sundowning.  Informed them that he will have some good days and bad days but overall he has been improving.  He still will benefit from ongoing rehab which they are also still wanting.  Old records reviewed in assessment of this patient  ROS: Constitutional: negative for chills and fevers, Respiratory: negative for cough, Cardiovascular: negative for chest pain and Gastrointestinal: negative for abdominal pain  Assessment & Plan: * CVA (cerebral vascular accident) ([place]) - per MRI brain: "Moderate size right PCA territory acute infarction involving temporo-occipital lobes. Occlusion of the right P2 PCA. Small acute cortical infarcts of the left frontal lobe and left parieto-occipital junction." -Echo: [id number]; diastolic function unable to be evaluated -PT/OT recommend CIR; patient has been improving steadily since admission and by the time CIR gets bed, may be too high functioning; patient's daughter and the situation would want him to probably go to an alternative rehab facility as she states thresholds in the house are not wide enough for a walker so he would need to no longer require a walker prior to going home; details of  discussion sent to case management to initiate process -Patient now on Eliquis in setting of presumed embolic etiology from underlying A. Fib (anticoagulation was discussed with GI and vascular surgery prior to initiation)  PAF (paroxysmal atrial fibrillation) ([place]) -Patient converted spontaneously back to normal sinus rhythm.  Now rate controlled -Continue Eliquis, see discussion above -Hemoglobin remained stable -Continue Coreg  Acute renal failure superimposed on stage 3b chronic kidney disease ([place]) -Baseline creatinine approximately 1.3-1.5 -Normal renal ultrasound -Patient also voiding well with normal urine output -Previously on Lasix and lisinopril outpatient (on hold for now)  Sundowning -Continue Seroquel -Continue as needed Haldol  History of GI bleed EGD 2019 shows nonbleeding gastric ulcer with visible blood vessel. Treated with cautery. No further bleeding since then. Patient is not on anticoagulation since then. Discussed with GI. Currently plan is to initiate anticoagulation and monitor. Appreciate assistance. Continue PPI.  HLD (hyperlipidemia) -Continue Crestor  Essential hypertension -Lisinopril on hold for now in setting of elevated creatinine -Continue Coreg and will dose adjust as needed  AAA (abdominal aortic aneurysm) without rupture ([facility]) -Continue Coreg and Crestor  Tobacco abuse -Now on nicotine patch.  Patient willing to quit  Antimicrobials:   DVT prophylaxis: Eliquis Code Status: Full Family Communication: Daughter bedside Disposition Plan: Status is: Inpatient  Remains inpatient appropriate because:Inpatient level of care appropriate due to severity of illness   Dispo: The patient is from: Home              Anticipated d/c is to: CIR  Anticipated d/c date is: 2 days              Patient currently is medically stable to d/c.   Objective: Blood pressure (!) 159/90, pulse 76, temperature 98.7 F (37.1 C),  temperature source Oral, resp. rate 18, height 5\' 9"  (1.753 m), weight 61.2 kg, SpO2 94 %.  Examination: General appearance: alert, cooperative and no distress Head: Normocephalic, without obvious abnormality, atraumatic Eyes: EOMI Lungs: clear to auscultation bilaterally Heart: regular rate and rhythm and S1, S2 normal Abdomen: normal findings: bowel sounds normal and soft, non-tender Extremities: no edema Skin: mobility and turgor normal Neurologic: LLE 4/5, otherwise 5/5 in remaining; sensation symmetric and normal  Consultants:   Neuro  Procedures:     Data Reviewed: I have personally reviewed following labs and imaging studies Results for orders placed or performed during the hospital encounter of 08/02/20 (from the past 24 hour(s))  CBC with Differential/Platelet     Status: Abnormal   Collection Time: 08/08/20  6:31 PM  Result Value Ref Range   WBC 5.8 4.0 - 10.5 K/uL   RBC 3.88 (L) 4.22 - 5.81 MIL/uL   Hemoglobin 10.5 (L) 13.0 - 17.0 g/dL   HCT 33.6 (L) 39.0 - 52.0 %   MCV 86.6 80.0 - 100.0 fL   MCH 27.1 26.0 - 34.0 pg   MCHC 31.3 30.0 - 36.0 g/dL   RDW 16.3 (H) 11.5 - 15.5 %   Platelets 129 (L) 150 - 400 K/uL   nRBC 0.0 0.0 - 0.2 %   Neutrophils Relative % 77 %   Neutro Abs 4.5 1.7 - 7.7 K/uL   Lymphocytes Relative 16 %   Lymphs Abs 0.9 0.7 - 4.0 K/uL   Monocytes Relative 7 %   Monocytes Absolute 0.4 0.1 - 1.0 K/uL   Eosinophils Relative 0 %   Eosinophils Absolute 0.0 0.0 - 0.5 K/uL   Basophils Relative 0 %   Basophils Absolute 0.0 0.0 - 0.1 K/uL   nRBC 0 0 /100 WBC   Abs Immature Granulocytes 0.00 0.00 - 0.07 K/uL  Basic metabolic panel     Status: Abnormal   Collection Time: 08/09/20  2:43 AM  Result Value Ref Range   Sodium 140 135 - 145 mmol/L   Potassium 4.0 3.5 - 5.1 mmol/L   Chloride 110 98 - 111 mmol/L   CO2 20 (L) 22 - 32 mmol/L   Glucose, Bld 94 70 - 99 mg/dL   BUN 22 8 - 23 mg/dL   Creatinine, Ser 1.74 (H) 0.61 - 1.24 mg/dL   Calcium 8.4  (L) 8.9 - 10.3 mg/dL   GFR, Estimated 37 (L) >60 mL/min   Anion gap 10 5 - 15  CBC with Differential/Platelet     Status: Abnormal   Collection Time: 08/09/20  2:43 AM  Result Value Ref Range   WBC 6.6 4.0 - 10.5 K/uL   RBC 3.89 (L) 4.22 - 5.81 MIL/uL   Hemoglobin 10.8 (L) 13.0 - 17.0 g/dL   HCT 33.0 (L) 39.0 - 52.0 %   MCV 84.8 80.0 - 100.0 fL   MCH 27.8 26.0 - 34.0 pg   MCHC 32.7 30.0 - 36.0 g/dL   RDW 16.3 (H) 11.5 - 15.5 %   Platelets 137 (L) 150 - 400 K/uL   nRBC 0.0 0.0 - 0.2 %   Neutrophils Relative % 50 %   Neutro Abs 3.4 1.7 - 7.7 K/uL   Lymphocytes Relative 25 %   Lymphs Abs  1.6 0.7 - 4.0 K/uL   Monocytes Relative 17 %   Monocytes Absolute 1.1 (H) 0.1 - 1.0 K/uL   Eosinophils Relative 1 %   Eosinophils Absolute 0.0 0.0 - 0.5 K/uL   Basophils Relative 0 %   Basophils Absolute 0.0 0.0 - 0.1 K/uL   RBC Morphology MORPHOLOGY UNREMARKABLE    Immature Granulocytes 7 %   Abs Immature Granulocytes 0.44 (H) 0.00 - 0.07 K/uL  Magnesium     Status: None   Collection Time: 08/09/20  2:43 AM  Result Value Ref Range   Magnesium 1.9 1.7 - 2.4 mg/dL    Recent Results (from the past 240 hour(s))  Resp Panel by RT-PCR (Flu A&B, Covid) Nasopharyngeal Swab     Status: None   Collection Time: 08/02/20  2:00 PM   Specimen: Nasopharyngeal Swab; Nasopharyngeal(NP) swabs in vial transport medium  Result Value Ref Range Status   SARS Coronavirus 2 by RT PCR NEGATIVE NEGATIVE Final    Comment: (NOTE) SARS-CoV-2 target nucleic acids are NOT DETECTED.  The SARS-CoV-2 RNA is generally detectable in upper respiratory specimens during the acute phase of infection. The lowest concentration of SARS-CoV-2 viral copies this assay can detect is 138 copies/mL. A negative result does not preclude SARS-Cov-2 infection and should not be used as the sole basis for treatment or other patient management decisions. A negative result may occur with  improper specimen collection/handling, submission of  specimen other than nasopharyngeal swab, presence of viral mutation(s) within the areas targeted by this assay, and inadequate number of viral copies(<138 copies/mL). A negative result must be combined with clinical observations, patient history, and epidemiological information. The expected result is Negative.  Fact Sheet for Patients:  EntrepreneurPulse.com.au  Fact Sheet for Healthcare Providers:  IncredibleEmployment.be  This test is no t yet approved or cleared by the [place] FDA and  has been authorized for detection and/or diagnosis of SARS-CoV-2 by FDA under an Emergency Use Authorization (EUA). This EUA will remain  in effect (meaning this test can be used) for the duration of the COVID-19 declaration under Section 564(b)(1) of the Act, 21 U.S.C.section 360bbb-3(b)(1), unless the authorization is terminated  or revoked sooner.       Influenza A by PCR NEGATIVE NEGATIVE Final   Influenza B by PCR NEGATIVE NEGATIVE Final    Comment: (NOTE) The Xpert Xpress SARS-CoV-2/FLU/RSV plus assay is intended as an aid in the diagnosis of influenza from Nasopharyngeal swab specimens and should not be used as a sole basis for treatment. Nasal washings and aspirates are unacceptable for Xpert Xpress SARS-CoV-2/FLU/RSV testing.  Fact Sheet for Patients: EntrepreneurPulse.com.au  Fact Sheet for Healthcare Providers: IncredibleEmployment.be  This test is not yet approved or cleared by the [place] FDA and has been authorized for detection and/or diagnosis of SARS-CoV-2 by FDA under an Emergency Use Authorization (EUA). This EUA will remain in effect (meaning this test can be used) for the duration of the COVID-19 declaration under Section 564(b)(1) of the Act, 21 U.S.C. section 360bbb-3(b)(1), unless the authorization is terminated or revoked.  Performed at [facility], [place] [address]  [place], [place] [address]      Radiology Studies: No results found. US RENAL  Final Result    MR BRAIN WO CONTRAST  Final Result    MR ANGIO HEAD WO CONTRAST  Final Result    MR ANGIO NECK W WO CONTRAST  Final Result    CT HEAD WO CONTRAST  Final Result  Addendum  1 of 1  ADDENDUM REPORT: 08/02/2020 15:56    ADDENDUM:  On further review, decreased attenuation is noted in the medial  right temporal-parietal junction region concerning for acute infarct  in this area.      Electronically Signed    By: Lowella Grip III M.D.    On: 08/02/2020 15:56      Final      Scheduled Meds:  apixaban  2.5 mg Oral BID   carvedilol  6.25 mg Oral BID WC   cholecalciferol  1,000 Units Oral Daily   pantoprazole  40 mg Oral Daily   polyethylene glycol  17 g Oral Daily   QUEtiapine  12.5 mg Oral BID   rosuvastatin  40 mg Oral Daily   senna-docusate  1 tablet Oral BID   tamsulosin  0.4 mg Oral Daily   PRN Meds: acetaminophen **OR** acetaminophen (TYLENOL) oral liquid 160 mg/5 mL **OR** acetaminophen, haloperidol lactate, zolpidem Continuous Infusions:   LOS: 7 days  Time spent: Greater than 50% of the 35 minute visit was spent in counseling/coordination of care for the patient as laid out in the A&P.   Dwyane Dee, MD [facility] 08/09/2020, 3:36 PM

## 2020-08-09 NOTE — Progress Notes (Signed)
Occupational Therapy Treatment Patient Details Name: Chris Peterson MRN: [id number] DOB: 1930-05-10 Today's Date: 08/09/2020    History of present illness Pt is an 86 y.o. male with a hx of sciatica, pre-diabetes, pericardial effusion, bilat atherosclerotic renal artery stenosis, paroxysmal a-fib, HTN, CKD stage 3B, AAA, GI bleed, HLD, and chronic anemia who presented with partial vision loss in both eyes, lightheadedness, slurred speech, slight L-sided weakness, and confusion. CT was negative, but MRI revealed moderate R side PCA acute CVA involving temporo-occipital lobes along with small acute cortical infarcts of L frontal lobe and L parieto-occipital junction and MRA showed occlusion of R P2 PCA. NIHSS = 2. No tPA administered.   OT comments  OT treatment session with focus on unsupported static standing balance, functional transfers, and RUE NMR. Patietn declined ADLs at this time. Patient able to stand from low recliner with Min guard to supervision A for safety demonstrating proper hand placement. In standing, patient able to complete functional activity with good bimanual manipulation. Difficulty noted with in hand manipulation particularly with translation for palm to fingertips. Patient able to stand statically for >5 min without UE support and no LOB although patient continues to require increased assist with long-distance functional mobility. Patient would benefit from continued acute OT services to maximize safety and independence with self-care tasks in prep for safe d/c to next level of care. Recommendation for CIR placement as patient needs to be Mod I upon d/c. Daughter present at bedside states that they will try to obtain 24hr supervision/assist so that CIR remains an option.   Follow Up Recommendations  CIR    Equipment Recommendations  3 in 1 bedside commode    Recommendations for Other Services      Precautions / Restrictions Precautions Precautions: Fall Precaution  Comments: HOH; monitor HR and BP Restrictions Weight Bearing Restrictions: No       Mobility Bed Mobility Overal bed mobility: Needs Assistance Bed Mobility: Supine to Sit     Supine to sit: Min assist     General bed mobility comments: Patient seated in recliner upon entry.  Transfers Overall transfer level: Needs assistance Equipment used: Rolling walker (2 wheeled) Transfers: Sit to/from Stand Sit to Stand: Min guard         General transfer comment: Min guard for safety. Patient with good knowledge of hand placement from previous therapy sessions.    Balance Overall balance assessment: Needs assistance Sitting-balance support: Feet supported;Single extremity supported Sitting balance-Leahy Scale: Good Sitting balance - Comments: pt able to wt shift in sitting without LOB   Standing balance support: No upper extremity supported;During functional activity Standing balance-Leahy Scale: Fair Standing balance comment: Patient able to maintain static standing during functional task for >5 min without UE support or LOB.                           ADL either performed or assessed with clinical judgement   ADL                                         General ADL Comments: Patient declined ADLs at this time.     Vision       Perception     Praxis      Cognition Arousal/Alertness: Awake/alert Behavior During Therapy: WFL for tasks assessed/performed;Flat affect Overall Cognitive Status: Within Functional Limits for  tasks assessed Area of Impairment: Memory;Following commands;Safety/judgement                     Memory: Decreased short-term memory Following Commands: Follows one step commands consistently;Follows multi-step commands with increased time Safety/Judgement: Decreased awareness of deficits Awareness: Emergent Problem Solving: Slow processing;Requires verbal cues General Comments: pt very appropriate during  session, asking appropriate questions concerning DC plan. noted to be slow to process but following all commands        Exercises Other Exercises Other Exercises: sit<>stand 15x 3 different ways. 5x normal, 5x R LE to the side, 5x R LE forward to encourage use of LLE. Verbal and tactile cues required to fully extend LLE in standing.   Shoulder Instructions       General Comments HR max 98 bpm during mobility    Pertinent Vitals/ Pain       Pain Assessment: No/denies pain Faces Pain Scale: Hurts a little bit Pain Location: R Shoulder Pain Descriptors / Indicators: Aching;Constant Pain Intervention(s): Monitored during session;Limited activity within patient's tolerance;Repositioned  Home Living                                          Prior Functioning/Environment              Frequency  Min 2X/week        Progress Toward Goals  OT Goals(current goals can now be found in the care plan section)  Progress towards OT goals: Progressing toward goals  Acute Rehab OT Goals Patient Stated Goal: to go home OT Goal Formulation: With patient/family Time For Goal Achievement: 08/18/20 Potential to Achieve Goals: Good ADL Goals Pt Will Perform Grooming: with supervision;standing Pt Will Transfer to Toilet: with supervision;ambulating Additional ADL Goal #1: Pt will demonstrate use of energy conservation strategies during ADL completion with <3 verbal cues. Additional ADL Goal #2: Pt will complete multistep cognition task with <3 errors for safe engagement in home environment.  Plan Discharge plan remains appropriate    Co-evaluation                 AM-PAC OT "6 Clicks" Daily Activity     Outcome Measure   Help from another person eating meals?: None Help from another person taking care of personal grooming?: A Little Help from another person toileting, which includes using toliet, bedpan, or urinal?: A Little Help from another person bathing  (including washing, rinsing, drying)?: A Little Help from another person to put on and taking off regular upper body clothing?: A Little Help from another person to put on and taking off regular lower body clothing?: A Little 6 Click Score: 19    End of Session Equipment Utilized During Treatment: Gait belt  OT Visit Diagnosis: Unsteadiness on feet (R26.81);Other abnormalities of gait and mobility (R26.89);Muscle weakness (generalized) (M62.81);Other symptoms and signs involving cognitive function;Low vision, both eyes (H54.2)   Activity Tolerance Patient tolerated treatment well   Patient Left in chair;with call bell/phone within reach;with chair alarm set   Nurse Communication          Time: [id number] OT Time Calculation (min): 23 min  Charges: OT General Charges $OT Visit: 1 Visit OT Treatments $Therapeutic Activity: 23-37 mins  Antaeus Karel H. OTR/L Supplemental OT, Department of rehab services [phone]   Wirt Hemmerich R H. 08/09/2020, 2:50 PM

## 2020-08-09 NOTE — Progress Notes (Signed)
Inpatient Rehab Admissions Coordinator:   Note pt still min assist overall with mobility.  Will discuss with rehab medical director, Dr. Naaman Plummer, to see if pt may appropriate for CIR or if he needs SNF.  Daughters open to either, as long as he has some sort of post-acute rehab before returning home.   Shann Medal, PT, DPT Admissions Coordinator [phone] 08/09/20  3:30 PM

## 2020-08-10 LAB — CBC WITH DIFFERENTIAL/PLATELET
Abs Immature Granulocytes: 0.31 10*3/uL — ABNORMAL HIGH (ref 0.00–0.07)
Basophils Absolute: 0 10*3/uL (ref 0.0–0.1)
Basophils Relative: 0 %
Eosinophils Absolute: 0 10*3/uL (ref 0.0–0.5)
Eosinophils Relative: 0 %
HCT: 32.5 % — ABNORMAL LOW (ref 39.0–52.0)
Hemoglobin: 10.5 g/dL — ABNORMAL LOW (ref 13.0–17.0)
Immature Granulocytes: 4 %
Lymphocytes Relative: 19 %
Lymphs Abs: 1.4 10*3/uL (ref 0.7–4.0)
MCH: 27.3 pg (ref 26.0–34.0)
MCHC: 32.3 g/dL (ref 30.0–36.0)
MCV: 84.4 fL (ref 80.0–100.0)
Monocytes Absolute: 1.2 10*3/uL — ABNORMAL HIGH (ref 0.1–1.0)
Monocytes Relative: 16 %
Neutro Abs: 4.5 10*3/uL (ref 1.7–7.7)
Neutrophils Relative %: 61 %
Platelets: 151 10*3/uL (ref 150–400)
RBC: 3.85 MIL/uL — ABNORMAL LOW (ref 4.22–5.81)
RDW: 15.9 % — ABNORMAL HIGH (ref 11.5–15.5)
WBC: 7.4 10*3/uL (ref 4.0–10.5)
nRBC: 0 % (ref 0.0–0.2)

## 2020-08-10 LAB — BASIC METABOLIC PANEL
Anion gap: 9 (ref 5–15)
BUN: 23 mg/dL (ref 8–23)
CO2: 21 mmol/L — ABNORMAL LOW (ref 22–32)
Calcium: 8.3 mg/dL — ABNORMAL LOW (ref 8.9–10.3)
Chloride: 109 mmol/L (ref 98–111)
Creatinine, Ser: 1.67 mg/dL — ABNORMAL HIGH (ref 0.61–1.24)
GFR, Estimated: 39 mL/min — ABNORMAL LOW (ref >60)
Glucose, Bld: 125 mg/dL — ABNORMAL HIGH (ref 70–99)
Potassium: 3.8 mmol/L (ref 3.5–5.1)
Sodium: 139 mmol/L (ref 135–145)

## 2020-08-10 LAB — MAGNESIUM: Magnesium: 1.8 mg/dL (ref 1.7–2.4)

## 2020-08-10 MED ORDER — CARVEDILOL 12.5 MG PO TABS
12.5000 mg | ORAL_TABLET | Freq: Two times a day (BID) | ORAL | Status: DC
  Administered 2020-08-10 – 2020-08-14 (×9): 12.5 mg via ORAL
  Filled 2020-08-10 (×9): qty 1

## 2020-08-10 NOTE — Progress Notes (Signed)
Inpatient Rehab Admissions Coordinator:   Spoke to rehab medical director, Dr. Naaman Plummer.  He agrees that pt is mobilizing well and would not reach mod I level during a short length of stay on CIR.  As family is not able to provide 24/7 supervision at this time, would recommend d/c to SNF for longer term rehab.  I spoke to the daughter on the phone and family is all in agreement for SNF over home health.  I will let Bronson Curb, RN CM, know, and sign off for CIR at this time.   Shann Medal, PT, DPT Admissions Coordinator [phone] 08/10/20  10:08 AM

## 2020-08-10 NOTE — TOC Initial Note (Addendum)
Transition of Care [facility]) - Initial/Assessment Note    Patient Details  Name: Chris Peterson MRN: [id number] Date of Birth: 1930-03-02  [facility] CM/SW Contact:    Kermit Balo, RN Phone Number: 08/10/2020, 1:35 PM  Clinical Narrative:                 Family unable to provide needed supervision after CIR stay. CM met with daughter; Kriste Basque and she prefers Education officer, museum for [employer] CM has messaged [employer] to see if they are able to offer. Kriste Basque is also agreeable to having pt faxed out in the [facility] area.  Pt has not had his covid vaccines.  TOC following.  1600: [facility] is on a bed hold. [facility] has no beds available. [facility] wont have beds until Jan 3. Daughter at the bedside updated.   Expected Discharge Plan: Skilled Nursing Facility Barriers to Discharge: SNF Pending bed offer   Patient Goals and CMS Choice   CMS Medicare.gov Compare Post Acute Care list provided to:: Patient Represenative (must comment) Choice offered to / list presented to : Adult Children,Patient  Expected Discharge Plan and Services Expected Discharge Plan: Skilled Nursing Facility In-house Referral: Clinical Social Work Discharge Planning Services: CM Consult Post Acute Care Choice: Skilled Nursing Facility Living arrangements for the past 2 months: Single Family Home                                      Prior Living Arrangements/Services Living arrangements for the past 2 months: Single Family Home Lives with:: Spouse Patient language and need for interpreter reviewed:: Yes Do you feel safe going back to the place where you live?: Yes      Need for Family Participation in Patient Care: Yes (Comment) Care giver support system in place?: No (comment)   Criminal Activity/Legal Involvement Pertinent to Current Situation/Hospitalization: No - Comment as needed  Activities of Daily Living      Permission Sought/Granted                   Emotional Assessment Appearance:: Appears stated age Attitude/Demeanor/Rapport: Engaged Affect (typically observed): Accepting Orientation: : Oriented to Self,Oriented to Place Alcohol / Substance Use: Alcohol Use    Admission diagnosis:  CVA (cerebral vascular accident) [facility]) [I63.9] Patient Active Problem List   Diagnosis Date Noted  . HLD (hyperlipidemia) 08/08/2020  . Acute renal failure superimposed on stage 3b chronic kidney disease (HCC) 08/08/2020  . History of GI bleed 08/08/2020  . Sundowning 08/08/2020  . PAF (paroxysmal atrial fibrillation) (HCC)   . AAA (abdominal aortic aneurysm) without rupture (HCC)   . Essential hypertension   . Alcohol abuse   . CVA (cerebral vascular accident) (HCC) 08/02/2020  . Pericarditis 02/23/2020  . Postop check 12/22/2019  . Diastolic heart failure (HCC) 12/01/2019  . GI bleed 03/13/2018  . Anemia associated with acute blood loss 03/13/2018  . Melena   . Acute gastric ulcer with hemorrhage   . Pericardial effusion   . Atherosclerotic renal artery stenosis, bilateral (HCC)   . HTN (hypertension) 11/28/2011  . AAA (abdominal aortic aneurysm) (HCC) 11/28/2011  . CKD (chronic kidney disease) 11/28/2011  . Diabetes mellitus type II 11/28/2011  . Tobacco abuse 11/28/2011   PCP:  Daisy Floro, MD Pharmacy:   [facility] [phone] - [address], [place] - [address] [place] [address] Phone:  [phone] Fax: [phone]     Social Determinants of Health (SDOH) Interventions    Readmission Risk Interventions No flowsheet data found.

## 2020-08-10 NOTE — Progress Notes (Signed)
PROGRESS NOTE    Chris Peterson   [id number]  DOB: [birth year]  DOA: 08/02/2020     8  PCP: Lawerance Cruel, MD  CC: weakness  Hospital Course: [name] is an 86 yo male with PMH A. fib not on anticoagulation secondary to GI bleed in past, CKD 3B, AAA, HLD, chronic anemia who presented with left-sided weakness.  After work-up he was found to have a moderate sized right PCA infarct involving the temporooccipital lobes and occlusion of the right P2 PCA.  There were also small acute cortical infarcts involving the left frontal lobe and left parieto-occipital junction.  Interval History:  Still some sundowning overnight.  Daughter present bedside this morning.  We discussed he will likely go to SNF at discharge.  CIR has signed off.  Old records reviewed in assessment of this patient  ROS: Constitutional: negative for chills and fevers, Respiratory: negative for cough, Cardiovascular: negative for chest pain and Gastrointestinal: negative for abdominal pain  Assessment & Plan: * CVA (cerebral vascular accident) ([place]) - per MRI brain: "Moderate size right PCA territory acute infarction involving temporo-occipital lobes. Occlusion of the right P2 PCA. Small acute cortical infarcts of the left frontal lobe and left parieto-occipital junction." -Echo: [id number]; diastolic function unable to be evaluated -PT/OT recommend CIR; patient has been improving steadily since admission and by the time CIR gets bed, may be too high functioning; patient's daughter and the situation would want him to probably go to an alternative rehab facility as she states thresholds in the house are not wide enough for a walker so he would need to no longer require a walker prior to going home; details of discussion sent to case management to initiate process -Patient now on Eliquis in setting of presumed embolic etiology from underlying A. Fib (anticoagulation was discussed with GI and vascular surgery prior to  initiation)  PAF (paroxysmal atrial fibrillation) ([place]) -Patient converted spontaneously back to normal sinus rhythm.  Now rate controlled -Continue Eliquis, see discussion above -Hemoglobin remained stable -Continue Coreg  Acute renal failure superimposed on stage 3b chronic kidney disease ([place]) -Baseline creatinine approximately 1.3-1.5 -Normal renal ultrasound -Patient also voiding well with normal urine output -Previously on Lasix and lisinopril outpatient (on hold for now)  Sundowning -Continue Seroquel -Continue as needed Haldol  History of GI bleed EGD 2019 shows nonbleeding gastric ulcer with visible blood vessel. Treated with cautery. No further bleeding since then. Patient is not on anticoagulation since then. Discussed with GI. Currently plan is to initiate anticoagulation and monitor. Appreciate assistance. Continue PPI.  HLD (hyperlipidemia) -Continue Crestor  Essential hypertension -Lisinopril on hold for now in setting of elevated creatinine -Continue Coreg and will dose adjust as needed  AAA (abdominal aortic aneurysm) without rupture (HCC) -Continue Coreg and Crestor  Tobacco abuse -Now on nicotine patch.  Patient willing to quit  Antimicrobials:   DVT prophylaxis: Eliquis Code Status: Full Family Communication: Daughter bedside Disposition Plan: Status is: Inpatient  Remains inpatient appropriate because:Inpatient level of care appropriate due to severity of illness   Dispo: The patient is from: Home              Anticipated d/c is to: CIR              Anticipated d/c date is: 2 days              Patient currently is medically stable to d/c.   Objective: Blood pressure (!) 180/87, pulse 65, temperature 98.6 F (  37 C), temperature source Oral, resp. rate 18, height 5\' 9"  (1.753 m), weight 61.2 kg, SpO2 98 %.  Examination: General appearance: alert, cooperative and no distress Head: Normocephalic, without obvious abnormality,  atraumatic Eyes: EOMI Lungs: clear to auscultation bilaterally Heart: regular rate and rhythm and S1, S2 normal Abdomen: normal findings: bowel sounds normal and soft, non-tender Extremities: no edema Skin: mobility and turgor normal Neurologic: LLE 4/5, otherwise 5/5 in remaining; sensation symmetric and normal  Consultants:   Neuro  Procedures:     Data Reviewed: I have personally reviewed following labs and imaging studies Results for orders placed or performed during the hospital encounter of 08/02/20 (from the past 24 hour(s))  Basic metabolic panel     Status: Abnormal   Collection Time: 08/10/20  3:15 AM  Result Value Ref Range   Sodium 139 135 - 145 mmol/L   Potassium 3.8 3.5 - 5.1 mmol/L   Chloride 109 98 - 111 mmol/L   CO2 21 (L) 22 - 32 mmol/L   Glucose, Bld 125 (H) 70 - 99 mg/dL   BUN 23 8 - 23 mg/dL   Creatinine, Ser 1.67 (H) 0.61 - 1.24 mg/dL   Calcium 8.3 (L) 8.9 - 10.3 mg/dL   GFR, Estimated 39 (L) >60 mL/min   Anion gap 9 5 - 15  CBC with Differential/Platelet     Status: Abnormal   Collection Time: 08/10/20  3:15 AM  Result Value Ref Range   WBC 7.4 4.0 - 10.5 K/uL   RBC 3.85 (L) 4.22 - 5.81 MIL/uL   Hemoglobin 10.5 (L) 13.0 - 17.0 g/dL   HCT 32.5 (L) 39.0 - 52.0 %   MCV 84.4 80.0 - 100.0 fL   MCH 27.3 26.0 - 34.0 pg   MCHC 32.3 30.0 - 36.0 g/dL   RDW 15.9 (H) 11.5 - 15.5 %   Platelets 151 150 - 400 K/uL   nRBC 0.0 0.0 - 0.2 %   Neutrophils Relative % 61 %   Neutro Abs 4.5 1.7 - 7.7 K/uL   Lymphocytes Relative 19 %   Lymphs Abs 1.4 0.7 - 4.0 K/uL   Monocytes Relative 16 %   Monocytes Absolute 1.2 (H) 0.1 - 1.0 K/uL   Eosinophils Relative 0 %   Eosinophils Absolute 0.0 0.0 - 0.5 K/uL   Basophils Relative 0 %   Basophils Absolute 0.0 0.0 - 0.1 K/uL   Immature Granulocytes 4 %   Abs Immature Granulocytes 0.31 (H) 0.00 - 0.07 K/uL  Magnesium     Status: None   Collection Time: 08/10/20  3:15 AM  Result Value Ref Range   Magnesium 1.8 1.7 - 2.4  mg/dL    Recent Results (from the past 240 hour(s))  Resp Panel by RT-PCR (Flu A&B, Covid) Nasopharyngeal Swab     Status: None   Collection Time: 08/02/20  2:00 PM   Specimen: Nasopharyngeal Swab; Nasopharyngeal(NP) swabs in vial transport medium  Result Value Ref Range Status   SARS Coronavirus 2 by RT PCR NEGATIVE NEGATIVE Final    Comment: (NOTE) SARS-CoV-2 target nucleic acids are NOT DETECTED.  The SARS-CoV-2 RNA is generally detectable in upper respiratory specimens during the acute phase of infection. The lowest concentration of SARS-CoV-2 viral copies this assay can detect is 138 copies/mL. A negative result does not preclude SARS-Cov-2 infection and should not be used as the sole basis for treatment or other patient management decisions. A negative result may occur with  improper specimen collection/handling, submission of specimen  other than nasopharyngeal swab, presence of viral mutation(s) within the areas targeted by this assay, and inadequate number of viral copies(<138 copies/mL). A negative result must be combined with clinical observations, patient history, and epidemiological information. The expected result is Negative.  Fact Sheet for Patients:  EntrepreneurPulse.com.au  Fact Sheet for Healthcare Providers:  IncredibleEmployment.be  This test is no t yet approved or cleared by the [place] FDA and  has been authorized for detection and/or diagnosis of SARS-CoV-2 by FDA under an Emergency Use Authorization (EUA). This EUA will remain  in effect (meaning this test can be used) for the duration of the COVID-19 declaration under Section 564(b)(1) of the Act, 21 U.S.C.section 360bbb-3(b)(1), unless the authorization is terminated  or revoked sooner.       Influenza A by PCR NEGATIVE NEGATIVE Final   Influenza B by PCR NEGATIVE NEGATIVE Final    Comment: (NOTE) The Xpert Xpress SARS-CoV-2/FLU/RSV plus assay is intended  as an aid in the diagnosis of influenza from Nasopharyngeal swab specimens and should not be used as a sole basis for treatment. Nasal washings and aspirates are unacceptable for Xpert Xpress SARS-CoV-2/FLU/RSV testing.  Fact Sheet for Patients: EntrepreneurPulse.com.au  Fact Sheet for Healthcare Providers: IncredibleEmployment.be  This test is not yet approved or cleared by the [place] FDA and has been authorized for detection and/or diagnosis of SARS-CoV-2 by FDA under an Emergency Use Authorization (EUA). This EUA will remain in effect (meaning this test can be used) for the duration of the COVID-19 declaration under Section 564(b)(1) of the Act, 21 U.S.C. section 360bbb-3(b)(1), unless the authorization is terminated or revoked.  Performed at [facility], [place] [address], [place], [place] [address]      Radiology Studies: No results found. US RENAL  Final Result    MR BRAIN WO CONTRAST  Final Result    MR ANGIO HEAD WO CONTRAST  Final Result    MR ANGIO NECK W WO CONTRAST  Final Result    CT HEAD WO CONTRAST  Final Result  Addendum 1 of 1  ADDENDUM REPORT: 08/02/2020 15:56    ADDENDUM:  On further review, decreased attenuation is noted in the medial  right temporal-parietal junction region concerning for acute infarct  in this area.      Electronically Signed    By: Lowella Grip III M.D.    On: 08/02/2020 15:56      Final      Scheduled Meds:  apixaban  2.5 mg Oral BID   carvedilol  12.5 mg Oral BID WC   cholecalciferol  1,000 Units Oral Daily   pantoprazole  40 mg Oral Daily   polyethylene glycol  17 g Oral Daily   QUEtiapine  12.5 mg Oral BID   rosuvastatin  40 mg Oral Daily   senna-docusate  1 tablet Oral BID   tamsulosin  0.4 mg Oral Daily   PRN Meds: acetaminophen **OR** acetaminophen (TYLENOL) oral liquid 160 mg/5 mL **OR** acetaminophen, haloperidol lactate,  zolpidem Continuous Infusions:   LOS: 8 days  Time spent: Greater than 50% of the 35 minute visit was spent in counseling/coordination of care for the patient as laid out in the A&P.   Dwyane Dee, MD [facility] 08/10/2020, 1:09 PM

## 2020-08-10 NOTE — Progress Notes (Signed)
  Speech Language Pathology Treatment: Cognitive-Linquistic  Patient Details Name: Chris Peterson MRN: [id number] DOB: 01/08/1930 Today's Date: 08/10/2020 Time: 1200-1230 SLP Time Calculation (min) (ACUTE ONLY): 30 min  Assessment / Plan / Recommendation Clinical Impression  Pt seen for cognitive treatment focusing on detailed information re: finances/functional tasks with improved outcome (65%) as pt was able to verbalize various components of bill paying, steps associated with this task (75%) and various bills he is responsible for at home to address functional tasks within home environment.  Pt stated he "walked down the hall with the PT" prior to ST session and was able to identify safety precautions for home when d/c re: ambulation, but verbalizing vs. performing tasks in question d/t nature of CVA and prior chart review re: cognitive function/family concerns. He stated HOH status impacts his memory and attention at times, so reiteration of mask wearing/strategies to utilize to improve overall comprehension discussed.  Pt recalled 75% of newspaper article with min verbal cues provided.  Continued ST recommended to address cognitive deficits.  Pending CIR placement.    HPI HPI: Pt is an 86 y.o. male with a hx of sciatica, pre-diabetes, pericardial effusion, bilat atherosclerotic renal artery stenosis, paroxysmal a-fib, HTN, CKD stage 3B, AAA, GI bleed, HLD, and chronic anemia who presented with partial vision loss in both eyes, lightheadedness, slurred speech, slight L-sided weakness, and confusion. CT was negative, but MRI revealed moderate R side PCA acute CVA involving temporo-occipital lobes along with small acute cortical infarcts of L frontal lobe and L parieto-occipital junction and MRA showed occlusion of R P2 PCA. NIHSS = 2. No tPA administered.      SLP Plan  Continue with current plan of care       Recommendations   Inpatient rehab vs. Outpatient ST                Follow  up Recommendations: Outpatient SLP;Inpatient Rehab SLP Visit Diagnosis: Cognitive communication deficit (Z85.885) Plan: Continue with current plan of care                       Elvina Sidle, M.S., [place] 08/10/2020, 1:47 PM

## 2020-08-10 NOTE — NC FL2 (Signed)
[place] LEVEL OF CARE SCREENING TOOL     IDENTIFICATION  Patient Name: Chris Peterson Birthdate: 07-25-1930 Sex: male Admission Date (Current Location): 08/02/2020  [facility] and [place] Number:  Herbalist and Address:  [facility] [place], [place] [address], [place], [place] [address]      Provider Number: [id number]  Attending Physician Name and Address:  Dwyane Dee, MD  Relative Name and Phone Number:       Current Level of Care: Hospital Recommended Level of Care: [place] Prior Approval Number:    Date Approved/Denied:   PASRR Number: [id number]  Discharge Plan: SNF    Current Diagnoses: Patient Active Problem List   Diagnosis Date Noted  . HLD (hyperlipidemia) 08/08/2020  . Acute renal failure superimposed on stage 3b chronic kidney disease ([place]) 08/08/2020  . History of GI bleed 08/08/2020  . Sundowning 08/08/2020  . PAF (paroxysmal atrial fibrillation) ([place])   . AAA (abdominal aortic aneurysm) without rupture ([place])   . Essential hypertension   . Alcohol abuse   . CVA (cerebral vascular accident) ([place]) 08/02/2020  . Pericarditis 02/23/2020  . Postop check 12/22/2019  . Diastolic heart failure ([place]) 12/01/2019  . GI bleed 03/13/2018  . Anemia associated with acute blood loss 03/13/2018  . Melena   . Acute gastric ulcer with hemorrhage   . Pericardial effusion   . Atherosclerotic renal artery stenosis, bilateral ([place])   . HTN (hypertension) 11/28/2011  . AAA (abdominal aortic aneurysm) ([place]) 11/28/2011  . CKD (chronic kidney disease) 11/28/2011  . Diabetes mellitus type II 11/28/2011  . Tobacco abuse 11/28/2011    Orientation RESPIRATION BLADDER Height & Weight     Self,Time,Situation,Place  Normal Incontinent Weight: 61.2 kg Height:  5\' 9"  (175.3 cm)  BEHAVIORAL SYMPTOMS/MOOD NEUROLOGICAL BOWEL NUTRITION STATUS      Continent Diet (Heart healthy with thin liquids)  AMBULATORY STATUS  COMMUNICATION OF NEEDS Skin   Limited Assist Verbally Bruising (bruising to bilateral arms)                       Personal Care Assistance Level of Assistance  Bathing,Feeding,Dressing Bathing Assistance: Limited assistance Feeding assistance: Independent Dressing Assistance: Limited assistance     Functional Limitations Info  Sight,Hearing,Speech Sight Info: Impaired Hearing Info: Adequate Speech Info: Adequate    SPECIAL CARE FACTORS FREQUENCY  PT (By licensed PT),OT (By licensed OT),Speech therapy     PT Frequency: 5x/wk OT Frequency: 5x/wk     Speech Therapy Frequency: 5x/wk      Contractures Contractures Info: Not present    Additional Factors Info  Code Status,Allergies,Psychotropic Code Status Info: Full Allergies Info: NKA Psychotropic Info: Seroquel 12.5 mg bid         Current Medications (08/10/2020):  This is the current hospital active medication list Current Facility-Administered Medications  Medication Dose Route Frequency Provider Last Rate Last Admin  . acetaminophen (TYLENOL) tablet 650 mg  650 mg Oral Q4H PRN Wynetta Fines T, MD   650 mg at 08/10/20 0214   Or  . acetaminophen (TYLENOL) 160 MG/5ML solution 650 mg  650 mg Per Tube Q4H PRN Wynetta Fines T, MD       Or  . acetaminophen (TYLENOL) suppository 650 mg  650 mg Rectal Q4H PRN Wynetta Fines T, MD      . apixaban Arne Cleveland) tablet 2.5 mg  2.5 mg Oral BID Lavina Hamman, MD   2.5 mg at 08/10/20  1122  . carvedilol (COREG) tablet 12.5 mg  12.5 mg Oral BID WC Dwyane Dee, MD   12.5 mg at 08/10/20 0850  . cholecalciferol (VITAMIN D3) tablet 1,000 Units  1,000 Units Oral Daily Wynetta Fines T, MD   1,000 Units at 08/10/20 1122  . haloperidol lactate (HALDOL) injection 2 mg  2 mg Intravenous Q6H PRN Opyd, Ilene Qua, MD      . pantoprazole (PROTONIX) EC tablet 40 mg  40 mg Oral Daily Wynetta Fines T, MD   40 mg at 08/10/20 1122  . polyethylene glycol (MIRALAX / GLYCOLAX) packet 17 g  17 g Oral Daily  Lavina Hamman, MD   17 g at 08/07/20 1006  . QUEtiapine (SEROQUEL) tablet 12.5 mg  12.5 mg Oral BID Lavina Hamman, MD   12.5 mg at 08/09/20 2218  . rosuvastatin (CRESTOR) tablet 40 mg  40 mg Oral Daily Wynetta Fines T, MD   40 mg at 08/10/20 1122  . senna-docusate (Senokot-S) tablet 1 tablet  1 tablet Oral BID Lavina Hamman, MD   1 tablet at 08/09/20 2218  . tamsulosin (FLOMAX) capsule 0.4 mg  0.4 mg Oral Daily Wynetta Fines T, MD   0.4 mg at 08/10/20 1122  . zolpidem (AMBIEN) tablet 5 mg  5 mg Oral QHS PRN Lavina Hamman, MD   5 mg at 08/08/20 2116     Discharge Medications: Please see discharge summary for a list of discharge medications.  Relevant Imaging Results:  Relevant Lab Results:   Additional Information SS#: [id number]  Pollie Friar, RN

## 2020-08-11 LAB — CBC WITH DIFFERENTIAL/PLATELET
Abs Immature Granulocytes: 0.41 10*3/uL — ABNORMAL HIGH (ref 0.00–0.07)
Basophils Absolute: 0 10*3/uL (ref 0.0–0.1)
Basophils Relative: 0 %
Eosinophils Absolute: 0 10*3/uL (ref 0.0–0.5)
Eosinophils Relative: 1 %
HCT: 33.7 % — ABNORMAL LOW (ref 39.0–52.0)
Hemoglobin: 10.6 g/dL — ABNORMAL LOW (ref 13.0–17.0)
Immature Granulocytes: 5 %
Lymphocytes Relative: 21 %
Lymphs Abs: 1.7 10*3/uL (ref 0.7–4.0)
MCH: 26.4 pg (ref 26.0–34.0)
MCHC: 31.5 g/dL (ref 30.0–36.0)
MCV: 83.8 fL (ref 80.0–100.0)
Monocytes Absolute: 1.1 10*3/uL — ABNORMAL HIGH (ref 0.1–1.0)
Monocytes Relative: 14 %
Neutro Abs: 4.6 10*3/uL (ref 1.7–7.7)
Neutrophils Relative %: 59 %
Platelets: 182 10*3/uL (ref 150–400)
RBC: 4.02 MIL/uL — ABNORMAL LOW (ref 4.22–5.81)
RDW: 15.8 % — ABNORMAL HIGH (ref 11.5–15.5)
WBC: 7.8 10*3/uL (ref 4.0–10.5)
nRBC: 0 % (ref 0.0–0.2)

## 2020-08-11 LAB — BASIC METABOLIC PANEL
Anion gap: 9 (ref 5–15)
BUN: 25 mg/dL — ABNORMAL HIGH (ref 8–23)
CO2: 21 mmol/L — ABNORMAL LOW (ref 22–32)
Calcium: 8.1 mg/dL — ABNORMAL LOW (ref 8.9–10.3)
Chloride: 110 mmol/L (ref 98–111)
Creatinine, Ser: 1.61 mg/dL — ABNORMAL HIGH (ref 0.61–1.24)
GFR, Estimated: 41 mL/min — ABNORMAL LOW (ref >60)
Glucose, Bld: 112 mg/dL — ABNORMAL HIGH (ref 70–99)
Potassium: 3.7 mmol/L (ref 3.5–5.1)
Sodium: 140 mmol/L (ref 135–145)

## 2020-08-11 LAB — MAGNESIUM: Magnesium: 1.7 mg/dL (ref 1.7–2.4)

## 2020-08-11 NOTE — Plan of Care (Signed)
  Problem: Safety: Goal: Ability to remain free from injury will improve Outcome: Progressing   Problem: Skin Integrity: Goal: Risk for impaired skin integrity will decrease Outcome: Progressing   Problem: Ischemic Stroke/TIA Tissue Perfusion: Goal: Complications of ischemic stroke/TIA will be minimized Outcome: Progressing   Problem: Education: Goal: Knowledge of disease or condition will improve Outcome: Progressing Goal: Knowledge of secondary prevention will improve Outcome: Progressing Goal: Knowledge of patient specific risk factors addressed and post discharge goals established will improve Outcome: Progressing Goal: Individualized Educational Video(s) Outcome: Progressing

## 2020-08-11 NOTE — Progress Notes (Signed)
Physical Therapy Treatment Patient Details Name: Chris Peterson MRN: [id number] DOB: [birth year] Today's Date: 08/11/2020    History of Present Illness Pt is an 86 y.o. male with a hx of sciatica, pre-diabetes, pericardial effusion, bilat atherosclerotic renal artery stenosis, paroxysmal a-fib, HTN, CKD stage 3B, AAA, GI bleed, HLD, and chronic anemia who presented with partial vision loss in both eyes, lightheadedness, slurred speech, slight L-sided weakness, and confusion. CT was negative, but MRI revealed moderate R side PCA acute CVA involving temporo-occipital lobes along with small acute cortical infarcts of L frontal lobe and L parieto-occipital junction and MRA showed occlusion of R P2 PCA. NIHSS = 2. No tPA administered.    PT Comments    Pt continuing to make progress with mobility.  Pt was turned down for CIR therefore changed recommendation to Short-term SNF for rehab. Pt seemed to be happy that he was making progress and improving.  Chris Peterson was present towards the end of the session.  I answered all patient's and Chris's current question regarding PT and mobility.        Follow Up Recommendations  SNF;Supervision - Intermittent (Changed plan as pt denied rehab. Pt's daighter really would like SNF prior to pt returning home)     Equipment Recommendations  None recommended by PT    Recommendations for Other Services       Precautions / Restrictions Precautions Precautions: Fall Precaution Comments: HOH Restrictions Weight Bearing Restrictions: No    Mobility  Bed Mobility Overal bed mobility: Needs Assistance Bed Mobility: Supine to Sit     Supine to sit: Supervision     General bed mobility comments: pt did use 1 rail to help sit up and get to EOB  Transfers Overall transfer level: Needs assistance Equipment used: Rolling walker (2 wheeled) Transfers: Sit to/from Stand Sit to Stand: Min guard (performed multiple times)             Ambulation/Gait Ambulation/Gait assistance: Min guard Gait Distance (Feet): 150 Feet (also walked 100 with RW, and walked about 10ft in room without device) Assistive device: Rolling walker (2 wheeled) Gait Pattern/deviations: Decreased stride length     General Gait Details: no balance losses, pt is steadier with RW than without.   Stairs             Wheelchair Mobility    Modified Rankin (Stroke Patients Only) Modified Rankin (Stroke Patients Only) Pre-Morbid Rankin Score: No significant disability Modified Rankin: Moderately severe disability     Balance                             High level balance activites: Backward walking;Direction changes;Turns High Level Balance Comments: without walker pt reached for objects to steady when walking backwards            Cognition Arousal/Alertness: Awake/alert Behavior During Therapy: WFL for tasks assessed/performed;Flat affect Overall Cognitive Status: Within Functional Limits for tasks assessed                         Following Commands: Follows multi-step commands consistently              Exercises      General Comments General comments (skin integrity, edema, etc.): Chris Peterson arrived towards of end of sessiona nd with pts permission discussed specifics of today's session.      Pertinent Vitals/Pain Pain Assessment: 0-10 Pain Score: 0-No pain  Home Living                      Prior Function            PT Goals (current goals can now be found in the care plan section) Progress towards PT goals: Progressing toward goals    Frequency    Min 3X/week (changed frequency to 3x week while awaiting SNF placement)      PT Plan Current plan remains appropriate    Co-evaluation              AM-PAC PT "6 Clicks" Mobility   Outcome Measure  Help needed turning from your back to your side while in a flat bed without using bedrails?: A Little Help  needed moving from lying on your back to sitting on the side of a flat bed without using bedrails?: A Little Help needed moving to and from a bed to a chair (including a wheelchair)?: A Little Help needed standing up from a chair using your arms (e.g., wheelchair or bedside chair)?: A Little Help needed to walk in hospital room?: A Little Help needed climbing 3-5 steps with a railing? : A Little 6 Click Score: 18    End of Session Equipment Utilized During Treatment: Gait belt Activity Tolerance: Patient tolerated treatment well Patient left: in chair;with call bell/phone within reach;with chair alarm set;with family/visitor present Nurse Communication: Mobility status (discussed with NT) PT Visit Diagnosis: Unsteadiness on feet (R26.81)     Time: 0981-1914 PT Time Calculation (min) (ACUTE ONLY): 36 min  Charges:  $Gait Training: 23-37 mins                     Chris Peterson, PT   Acute Rehabilitation Services  Pager [phone] Office [phone] 08/11/2020    Chris Peterson 08/11/2020, 12:46 PM

## 2020-08-11 NOTE — TOC Progression Note (Addendum)
Transition of Care [facility]) - Progression Note    Patient Details  Name: Chris Peterson MRN: [id number] Date of Birth: [birth year]  Transition of [facility]) CM/SW Contact  Joanne Chars, LCSW Phone Number: 08/11/2020, 10:47 AM  Clinical Narrative:   CSW spoke with daughter [name] who initially accepted the bed offer at [facility], then called back and said no, pt's wife had heard of bad experience there.  Also do not want [facility].  Sharilyn Sites all of the pending places and she will do her research.  She had questions about why declined CIR and CSW was able to answer those.    1230: call back from [facility], they have researched the other options and would like to reverse course again and accept the [facility] bed offer.  CSW spoke with Loi at [facility] and she is willing to take the pt.      Expected Discharge Plan: Skilled Nursing Facility Barriers to Discharge: SNF Pending bed offer  Expected Discharge Plan and Services Expected Discharge Plan: [place] In-house Referral: Clinical Social Work Discharge Planning Services: CM Consult Post Acute Care Choice: [place] arrangements for the past 2 months: Single Family Home                                       Social Determinants of Health (SDOH) Interventions    Readmission Risk Interventions No flowsheet data found.

## 2020-08-11 NOTE — ED Provider Notes (Signed)
[place] 3W PROGRESSIVE CARE Provider Note   CSN: [id number] Arrival date & time: 08/02/20  1124     History Chief Complaint  Patient presents with  . Altered Mental Status    Chris Peterson is a 86 y.o. male.  HPI    86 y.o. male with medical history significant of paroxysmal A. fib not on anticoagulation or aspirin, HTN, CKD stage IIIB, AAA, HLD, GI bleed, chronic anemia, presented with strokelike symptoms.  Patient was last seen normal last night, patient woke up this morning around 730 complaining about partial vision loss on the both eyes which is new.  In addition, patient also felt lightheaded and family reported patient was confused.  About 30 minutes later, when EMS arrived, family found patient speech was slurred and continue to have confusion. Pt's speech and vision has improved, but family reports that he is still not as sharp as usual/ is confused. No headaches, neck pain. Pt is not on blood thinners for his AF.  Past Medical History:  Diagnosis Date  . AAA (abdominal aortic aneurysm) ([place])   . Atherosclerotic renal artery stenosis, bilateral (HCC)    Bilateral renal angiography 11/27/11, Right 70% prox, Left 90% prox s/p LRA PTA/Stenting - 6.0x38m ;  12/12/2011 Renal Duplex: Patent LRA stent & > 60% RRA ost stenosis  . Atrial fibrillation ([place] 11/19/2019  . Blood transfusion    S/P "lost blood due to hemorrhoids"  . Complication of anesthesia 2013   Severe Diarrhea aftter renal artery  . Dyspnea    with effusion  . Dysrhythmia    "fast one time"  . Hemorrhoids   . High cholesterol   . HTN (hypertension)   . Kidney disease, chronic, stage III (moderate, EGFR 30-59 ml/min) (HCC)    [facility]  . Pericardial effusion    s/p pericardiocentesis 11/2019 // reaccumulated >> s/p window 12/2019 // Echo 9/21: EF 60-65, no RWMA, moderate LVH, normal RVSF, severe BAE, small effusion posterior to the left ventricle (no evidence of tamponade), mild-moderate MR   . Pre-diabetes   . Sciatica     Patient Active Problem List   Diagnosis Date Noted  . HLD (hyperlipidemia) 08/08/2020  . Acute renal failure superimposed on stage 3b chronic kidney disease ([facility] 08/08/2020  . History of GI bleed 08/08/2020  . Sundowning 08/08/2020  . PAF (paroxysmal atrial fibrillation) ([place]   . AAA (abdominal aortic aneurysm) without rupture ([place]   . Essential hypertension   . Alcohol abuse   . CVA (cerebral vascular accident) ([place] 08/02/2020  . Pericarditis 02/23/2020  . Postop check 12/22/2019  . Diastolic heart failure ([place] 12/01/2019  . GI bleed 03/13/2018  . Anemia associated with acute blood loss 03/13/2018  . Melena   . Acute gastric ulcer with hemorrhage   . Pericardial effusion   . Atherosclerotic renal artery stenosis, bilateral ([place]   . HTN (hypertension) 11/28/2011  . AAA (abdominal aortic aneurysm) ([place] 11/28/2011  . CKD (chronic kidney disease) 11/28/2011  . Diabetes mellitus type II 11/28/2011  . Tobacco abuse 11/28/2011    Past Surgical History:  Procedure Laterality Date  . ABDOMINAL ANGIOGRAM N/A 11/27/2011   Procedure: ABDOMINAL ANGIOGRAM;  Surgeon: MSherren Mocha MD;  [facility];  Service: Cardiovascular;  Laterality: N/A;  . BIOPSY  03/13/2018   Procedure: BIOPSY;  Surgeon: SLadene Artist MD;  Location: [facility] ENDOSCOPY;  Service: Endoscopy;;  . CATARACT EXTRACTION W/ INTRAOCULAR LENS  IMPLANT, BILATERAL  1990's  . ESOPHAGOGASTRODUODENOSCOPY N/A  03/13/2018   Procedure: ESOPHAGOGASTRODUODENOSCOPY (EGD);  Surgeon: Ladene Artist, MD;  Location: [facility];  Service: Endoscopy;  Laterality: N/A;  . HOT HEMOSTASIS N/A 03/13/2018   Procedure: HOT HEMOSTASIS (ARGON PLASMA COAGULATION/BICAP);  Surgeon: Ladene Artist, MD;  Location: [facility];  Service: Endoscopy;  Laterality: N/A;  . NASAL SEPTUM SURGERY  1980's  . PERICARDIOCENTESIS N/A 10/27/2019   Procedure: PERICARDIOCENTESIS;  Surgeon: Sherren Mocha, MD;   Location: [facility] CV LAB;  Service: Cardiovascular;  Laterality: N/A;  . RENAL ANGIOGRAM Bilateral 11/27/2011   Procedure: RENAL ANGIOGRAM;  Surgeon: Sherren Mocha, MD;  Location: [facility] CATH LAB;  Service: Cardiovascular;  Laterality: Bilateral;  possible PTA/stent  . RENAL ARTERY STENT  11/27/11  . RIGHT HEART CATH N/A 10/27/2019   Procedure: RIGHT HEART CATH;  Surgeon: Sherren Mocha, MD;  Location: [place] CV LAB;  Service: Cardiovascular;  Laterality: N/A;  . SUBXYPHOID PERICARDIAL WINDOW N/A 12/09/2019   Procedure: SUBXYPHOID PERICARDIAL WINDOW;  Surgeon: Ivin Poot, MD;  Location: [place];  Service: Thoracic;  Laterality: N/A;  . TEE WITHOUT CARDIOVERSION N/A 12/09/2019   Procedure: TRANSESOPHAGEAL ECHOCARDIOGRAM (TEE);  Surgeon: Prescott Gum, Collier Salina, MD;  Location: [facility] OR;  Service: Thoracic;  Laterality: N/A;       Family History  Problem Relation Age of Onset  . Hypertension Mother   . Hypertension Brother     Social History   Tobacco Use  . Smoking status: Current Every Day Smoker    Years: 65.00    Types: Pipe, Cigarettes    Last attempt to quit: 08/19/1996    Years since quitting: 23.9  . Smokeless tobacco: Never Used  . Tobacco comment: 12/07/2019=- smokes a pipe  Vaping Use  . Vaping Use: Never used  Substance Use Topics  . Alcohol use: Yes    Alcohol/week: 10.0 standard drinks    Types: 10 Shots of liquor per week    Comment: 12/07/2019 - drinks 1 - 2 oz a day  . Drug use: No    Home Medications Prior to Admission medications   Medication Sig Start Date End Date Taking? Authorizing Provider  carvedilol (COREG) 12.5 MG tablet Take 12.5 mg by mouth 2 (two) times daily. 02/28/20  Yes [provider]  cholecalciferol (VITAMIN D) 25 MCG (1000 UNIT) tablet Take 1 tablet (1,000 Units total) by mouth daily. 12/12/19  Yes Lars Pinks M, PA-C  furosemide (LASIX) 40 MG tablet Take 40 mg by mouth daily. 06/16/20  Yes [provider]  lisinopril  (ZESTRIL) 10 MG tablet Take 10 mg by mouth daily. 06/16/20  Yes [provider]  rosuvastatin (CRESTOR) 40 MG tablet Take 40 mg by mouth daily.   Yes [provider]  tamsulosin (FLOMAX) 0.4 MG CAPS capsule Take 0.4 mg by mouth daily.   Yes [provider]    Allergies    Patient has no known allergies.  Review of Systems   Review of Systems  Constitutional: Positive for activity change.  Eyes: Positive for visual disturbance.  Cardiovascular: Negative for chest pain.  Neurological: Positive for speech difficulty. Negative for dizziness.  Hematological: Does not bruise/bleed easily.  All other systems reviewed and are negative.   Physical Exam Updated Vital Signs BP (!) 148/95 (BP Location: Left Arm)   Pulse 86   Temp 98.1 F (36.7 C) (Oral)   Resp 18   Ht _0  (1.753 m)   Wt 61.2 kg   SpO2 100%   BMI 19.94 kg/m  Physical Exam Vitals and nursing note reviewed.  Constitutional:      Appearance: He is well-developed.  HENT:     Head: Atraumatic.  Eyes:     Comments: L hemianopsia  Cardiovascular:     Rate and Rhythm: Normal rate.  Pulmonary:     Effort: Pulmonary effort is normal.  Musculoskeletal:     Cervical back: Neck supple.  Skin:    General: Skin is warm.  Neurological:     Mental Status: He is alert and oriented to person, place, and time.     Cranial Nerves: No cranial nerve deficit.     Sensory: No sensory deficit.     Motor: No weakness.     ED Results / Procedures / Treatments   Labs (all labs ordered are listed, but only abnormal results are displayed) Labs Reviewed  CBC - Abnormal; Notable for the following components:      Result Value   RBC 4.05 (*)    Hemoglobin 10.9 (*)    HCT 35.1 (*)    RDW 16.2 (*)    Platelets 134 (*)    All other components within normal limits  COMPREHENSIVE METABOLIC PANEL - Abnormal; Notable for the following components:   Glucose, Bld 159 (*)    BUN 38 (*)    Creatinine, Ser  1.80 (*)    Total Protein 6.2 (*)    Albumin 3.0 (*)    GFR, Estimated 36 (*)    All other components within normal limits  URINALYSIS, ROUTINE W REFLEX MICROSCOPIC - Abnormal; Notable for the following components:   Color, Urine STRAW (*)    All other components within normal limits  HEMOGLOBIN A1C - Abnormal; Notable for the following components:   Hgb A1c MFr Bld 6.6 (*)    All other components within normal limits  CBC - Abnormal; Notable for the following components:   Hemoglobin 11.8 (*)    HCT 37.7 (*)    RDW 16.7 (*)    Platelets 144 (*)    All other components within normal limits  COMPREHENSIVE METABOLIC PANEL - Abnormal; Notable for the following components:   CO2 19 (*)    Glucose, Bld 103 (*)    BUN 34 (*)    Creatinine, Ser 1.74 (*)    Total Protein 6.0 (*)    Albumin 3.1 (*)    Total Bilirubin 1.3 (*)    GFR, Estimated 37 (*)    Anion gap 19 (*)    All other components within normal limits  BASIC METABOLIC PANEL - Abnormal; Notable for the following components:   Potassium 3.2 (*)    Glucose, Bld 136 (*)    BUN 30 (*)    Creatinine, Ser 1.85 (*)    Calcium 8.8 (*)    GFR, Estimated 35 (*)    All other components within normal limits  BASIC METABOLIC PANEL - Abnormal; Notable for the following components:   Glucose, Bld 102 (*)    BUN 26 (*)    Creatinine, Ser 1.73 (*)    GFR, Estimated 38 (*)    All other components within normal limits  CBC - Abnormal; Notable for the following components:   RBC 4.01 (*)    Hemoglobin 10.8 (*)    HCT 34.4 (*)    RDW 16.4 (*)    Platelets 127 (*)    All other components within normal limits  BASIC METABOLIC PANEL - Abnormal; Notable for the following components:   Potassium 3.4 (*)  Glucose, Bld 114 (*)    BUN 28 (*)    Creatinine, Ser 1.81 (*)    Calcium 8.8 (*)    GFR, Estimated 36 (*)    All other components within normal limits  CBC - Abnormal; Notable for the following components:   RBC 4.05 (*)     Hemoglobin 10.9 (*)    HCT 34.6 (*)    RDW 16.6 (*)    Platelets 119 (*)    All other components within normal limits  BASIC METABOLIC PANEL - Abnormal; Notable for the following components:   Glucose, Bld 113 (*)    BUN 28 (*)    Creatinine, Ser 1.75 (*)    Calcium 8.4 (*)    GFR, Estimated 37 (*)    All other components within normal limits  CBC - Abnormal; Notable for the following components:   RBC 3.81 (*)    Hemoglobin 10.3 (*)    HCT 32.6 (*)    RDW 16.3 (*)    Platelets 118 (*)    All other components within normal limits  BASIC METABOLIC PANEL - Abnormal; Notable for the following components:   Glucose, Bld 107 (*)    BUN 26 (*)    Creatinine, Ser 1.77 (*)    Calcium 8.4 (*)    GFR, Estimated 36 (*)    All other components within normal limits  BASIC METABOLIC PANEL - Abnormal; Notable for the following components:   CO2 20 (*)    Glucose, Bld 110 (*)    BUN 26 (*)    Creatinine, Ser 1.75 (*)    Calcium 8.3 (*)    GFR, Estimated 37 (*)    All other components within normal limits  CBC WITH DIFFERENTIAL/PLATELET - Abnormal; Notable for the following components:   RBC 3.88 (*)    Hemoglobin 10.5 (*)    HCT 33.6 (*)    RDW 16.3 (*)    Platelets 129 (*)    All other components within normal limits  BASIC METABOLIC PANEL - Abnormal; Notable for the following components:   CO2 20 (*)    Creatinine, Ser 1.74 (*)    Calcium 8.4 (*)    GFR, Estimated 37 (*)    All other components within normal limits  CBC WITH DIFFERENTIAL/PLATELET - Abnormal; Notable for the following components:   RBC 3.89 (*)    Hemoglobin 10.8 (*)    HCT 33.0 (*)    RDW 16.3 (*)    Platelets 137 (*)    Monocytes Absolute 1.1 (*)    Abs Immature Granulocytes 0.44 (*)    All other components within normal limits  BASIC METABOLIC PANEL - Abnormal; Notable for the following components:   CO2 21 (*)    Glucose, Bld 125 (*)    Creatinine, Ser 1.67 (*)    Calcium 8.3 (*)    GFR, Estimated 39  (*)    All other components within normal limits  CBC WITH DIFFERENTIAL/PLATELET - Abnormal; Notable for the following components:   RBC 3.85 (*)    Hemoglobin 10.5 (*)    HCT 32.5 (*)    RDW 15.9 (*)    Monocytes Absolute 1.2 (*)    Abs Immature Granulocytes 0.31 (*)    All other components within normal limits  BASIC METABOLIC PANEL - Abnormal; Notable for the following components:   CO2 21 (*)    Glucose, Bld 112 (*)    BUN 25 (*)    Creatinine, Ser 1.61 (*)  Calcium 8.1 (*)    GFR, Estimated 41 (*)    All other components within normal limits  CBC WITH DIFFERENTIAL/PLATELET - Abnormal; Notable for the following components:   RBC 4.02 (*)    Hemoglobin 10.6 (*)    HCT 33.7 (*)    RDW 15.8 (*)    Monocytes Absolute 1.1 (*)    Abs Immature Granulocytes 0.41 (*)    All other components within normal limits  RESP PANEL BY RT-PCR (FLU A&B, COVID) [id number]  PROTIME-INR  APTT  DIFFERENTIAL  LIPID PANEL  TSH  RPR  CK  MAGNESIUM  MAGNESIUM  MAGNESIUM  MAGNESIUM  MAGNESIUM  MAGNESIUM  MAGNESIUM  MAGNESIUM  MAGNESIUM  BASIC METABOLIC PANEL  CBC WITH DIFFERENTIAL/PLATELET  MAGNESIUM    EKG EKG Interpretation  Date/Time:  Friday August 04 2020 01:03:46 EST Ventricular Rate:  99 PR Interval:    QRS Duration: 130 QT Interval:  390 QTC Calculation: 500 R Axis:   73 Text Interpretation: Atrial fibrillation Right bundle branch block T wave abnormality, consider lateral ischemia Abnormal ECG Confirmed by Merrily Pew [phone]) on 08/05/2020 12:37:56 PM   Radiology No results found.  Procedures Procedures (including critical care time)  Medications Ordered in ED Medications  rosuvastatin (CRESTOR) tablet 40 mg (40 mg Oral Given 08/11/20 1006)  tamsulosin (FLOMAX) capsule 0.4 mg (0.4 mg Oral Given 08/11/20 1005)  cholecalciferol (VITAMIN D3) tablet 1,000 Units (1,000 Units Oral Given 08/11/20 1004)  acetaminophen (TYLENOL) tablet 650 mg (650 mg Oral Given  08/10/20 0214)    Or  acetaminophen (TYLENOL) 160 MG/5ML solution 650 mg ( Per Tube See Alternative 08/10/20 0214)    Or  acetaminophen (TYLENOL) suppository 650 mg ( Rectal See Alternative 08/10/20 0214)  pantoprazole (PROTONIX) EC tablet 40 mg (40 mg Oral Given 08/11/20 1005)  haloperidol lactate (HALDOL) injection 2 mg (has no administration in time range)  zolpidem (AMBIEN) tablet 5 mg (5 mg Oral Given 08/08/20 2116)  senna-docusate (Senokot-S) tablet 1 tablet (1 tablet Oral Not Given 08/11/20 2231)  polyethylene glycol (MIRALAX / GLYCOLAX) packet 17 g (17 g Oral Given 08/11/20 1006)  apixaban (ELIQUIS) tablet 2.5 mg (2.5 mg Oral Given 08/11/20 2231)  QUEtiapine (SEROQUEL) tablet 12.5 mg (12.5 mg Oral Given 08/11/20 2231)  carvedilol (COREG) tablet 12.5 mg (12.5 mg Oral Given 08/11/20 1805)  gadobutrol (GADAVIST) 1 MMOL/ML injection 6 mL (6 mLs Intravenous Contrast Given 08/02/20 1522)   stroke: mapping our early stages of recovery book ( Does not apply Given 08/02/20 1640)  potassium chloride SA (KLOR-CON) CR tablet 20 mEq (20 mEq Oral Given 08/04/20 1035)    ED Course  I have reviewed the triage vital signs and the nursing notes.  Pertinent labs & imaging results that were available during my care of the patient were reviewed by me and considered in my medical decision making (see chart for details).    MDM Rules/Calculators/A&P                          86 y/o with hx of AF, TIA not on [facility] for GI bleed comes in with wake up stroke. Last normal around 9 pm when pt went to bed. Symptoms have evolved some since this morning and improved slightly. Neg LVO screen. Outside of TPA window.   Concerns for subacute stroke. Neuro consulted and medicine will admit. 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 Thanks.   08/04/20 2127           Varney Biles, MD 08/11/20 2248

## 2020-08-11 NOTE — Progress Notes (Signed)
PROGRESS NOTE    Chris Peterson   JEH:[id number]  DOB: 06-08-1930  DOA: 08/02/2020     9  PCP: Chris Cruel, MD  CC: weakness  Hospital Course: Chris Peterson is an 86 yo male with PMH A. fib not on anticoagulation secondary to GI bleed in past, CKD 3B, AAA, HLD, chronic anemia who presented with left-sided weakness.  After work-up he was found to have a moderate sized right PCA infarct involving the temporooccipital lobes and occlusion of the right P2 PCA.  There were also small acute cortical infarcts involving the left frontal lobe and left parieto-occipital junction.  Interval History:  No events overnight.  Patient states he slept well.  They have accepted a tentative bed offer for [facility] at this time.  Tentative plan for discharge on Monday.  Old records reviewed in assessment of this patient  ROS: Constitutional: negative for chills and fevers, Respiratory: negative for cough, Cardiovascular: negative for chest pain and Gastrointestinal: negative for abdominal pain  Assessment & Plan: * CVA (cerebral vascular accident) ([place]) - per MRI brain: "Moderate size right PCA territory acute infarction involving temporo-occipital lobes. Occlusion of the right P2 PCA. Small acute cortical infarcts of the left frontal lobe and left parieto-occipital junction." -Echo: VZ<85%; diastolic function unable to be evaluated -PT/OT recommend CIR; patient has been improving steadily since admission and by the time CIR gets bed, may be too high functioning; patient's daughter and the situation would want him to probably go to an alternative rehab facility as she states thresholds in the house are not wide enough for a walker so he would need to no longer require a walker prior to going home; details of discussion sent to case management to initiate process -Patient now on Eliquis in setting of presumed embolic etiology from underlying A. Fib (anticoagulation was discussed with GI and vascular  surgery prior to initiation) - patient and family have accepted bed offer at [facility]; tentative plan for d/c on Monday  PAF (paroxysmal atrial fibrillation) ([place]) -Patient converted spontaneously back to normal sinus rhythm.  Now rate controlled -Continue Eliquis, see discussion above -Hemoglobin remained stable -Continue Coreg  Acute renal failure superimposed on stage 3b chronic kidney disease ([place]) -Baseline creatinine approximately 1.3-1.5 -Normal renal ultrasound -Patient also voiding well with normal urine output -Previously on Lasix and lisinopril outpatient (on hold for now)  Sundowning -Continue Seroquel -Continue as needed Haldol  History of GI bleed EGD 2019 shows nonbleeding gastric ulcer with visible blood vessel. Treated with cautery. No further bleeding since then. Patient is not on anticoagulation since then. Discussed with GI. Currently plan is to initiate anticoagulation and monitor. Appreciate assistance. Continue PPI.  HLD (hyperlipidemia) -Continue Crestor  Essential hypertension -Lisinopril on hold for now in setting of elevated creatinine -Continue Coreg and will dose adjust as needed  AAA (abdominal aortic aneurysm) without rupture (HCC) -Continue Coreg and Crestor  Tobacco abuse -Now on nicotine patch.  Patient willing to quit  Antimicrobials:   DVT prophylaxis: Eliquis Code Status: Full Family Communication: Daughter bedside Disposition Plan: Status is: Inpatient  Remains inpatient appropriate because:Inpatient level of care appropriate due to severity of illness   Dispo: The patient is from: Home              Anticipated d/c is to: [facility]               Anticipated d/c date is: Monday  Patient currently is medically stable to d/c.   Objective: Blood pressure 110/70, pulse 89, temperature 98.2 F (36.8 C), temperature source Oral, resp. rate 18, height 5\' 9"  (1.753 m), weight 61.2 kg, SpO2 95 %.   Examination: General appearance: alert, cooperative and no distress Head: Normocephalic, without obvious abnormality, atraumatic Eyes: EOMI Lungs: clear to auscultation bilaterally Heart: regular rate and rhythm and S1, S2 normal Abdomen: normal findings: bowel sounds normal and soft, non-tender Extremities: no edema Skin: mobility and turgor normal Neurologic: LLE/LUE 4/5, otherwise 5/5 in remaining; sensation symmetric and normal  Consultants:   Neuro  Procedures:     Data Reviewed: I have personally reviewed following labs and imaging studies Results for orders placed or performed during the hospital encounter of 08/02/20 (from the past 24 hour(s))  Basic metabolic panel     Status: Abnormal   Collection Time: 08/11/20  2:51 AM  Result Value Ref Range   Sodium 140 135 - 145 mmol/L   Potassium 3.7 3.5 - 5.1 mmol/L   Chloride 110 98 - 111 mmol/L   CO2 21 (L) 22 - 32 mmol/L   Glucose, Bld 112 (H) 70 - 99 mg/dL   BUN 25 (H) 8 - 23 mg/dL   Creatinine, Ser 1.61 (H) 0.61 - 1.24 mg/dL   Calcium 8.1 (L) 8.9 - 10.3 mg/dL   GFR, Estimated 41 (L) >60 mL/min   Anion gap 9 5 - 15  CBC with Differential/Platelet     Status: Abnormal   Collection Time: 08/11/20  2:51 AM  Result Value Ref Range   WBC 7.8 4.0 - 10.5 K/uL   RBC 4.02 (L) 4.22 - 5.81 MIL/uL   Hemoglobin 10.6 (L) 13.0 - 17.0 g/dL   HCT 33.7 (L) 39.0 - 52.0 %   MCV 83.8 80.0 - 100.0 fL   MCH 26.4 26.0 - 34.0 pg   MCHC 31.5 30.0 - 36.0 g/dL   RDW 15.8 (H) 11.5 - 15.5 %   Platelets 182 150 - 400 K/uL   nRBC 0.0 0.0 - 0.2 %   Neutrophils Relative % 59 %   Neutro Abs 4.6 1.7 - 7.7 K/uL   Lymphocytes Relative 21 %   Lymphs Abs 1.7 0.7 - 4.0 K/uL   Monocytes Relative 14 %   Monocytes Absolute 1.1 (H) 0.1 - 1.0 K/uL   Eosinophils Relative 1 %   Eosinophils Absolute 0.0 0.0 - 0.5 K/uL   Basophils Relative 0 %   Basophils Absolute 0.0 0.0 - 0.1 K/uL   Immature Granulocytes 5 %   Abs Immature Granulocytes 0.41 (H) 0.00  - 0.07 K/uL  Magnesium     Status: None   Collection Time: 08/11/20  2:51 AM  Result Value Ref Range   Magnesium 1.7 1.7 - 2.4 mg/dL    Recent Results (from the past 240 hour(s))  Resp Panel by RT-PCR (Flu A&B, Covid) Nasopharyngeal Swab     Status: None   Collection Time: 08/02/20  2:00 PM   Specimen: Nasopharyngeal Swab; Nasopharyngeal(NP) swabs in vial transport medium  Result Value Ref Range Status   SARS Coronavirus 2 by RT PCR NEGATIVE NEGATIVE Final    Comment: (NOTE) SARS-CoV-2 target nucleic acids are NOT DETECTED.  The SARS-CoV-2 RNA is generally detectable in upper respiratory specimens during the acute phase of infection. The lowest concentration of SARS-CoV-2 viral copies this assay can detect is 138 copies/mL. A negative result does not preclude SARS-Cov-2 infection and should not be used as the sole basis for  treatment or other patient management decisions. A negative result may occur with  improper specimen collection/handling, submission of specimen other than nasopharyngeal swab, presence of viral mutation(s) within the areas targeted by this assay, and inadequate number of viral copies(<138 copies/mL). A negative result must be combined with clinical observations, patient history, and epidemiological information. The expected result is Negative.  Fact Sheet for Patients:  EntrepreneurPulse.com.au  Fact Sheet for Healthcare Providers:  IncredibleEmployment.be  This test is no t yet approved or cleared by the [place] FDA and  has been authorized for detection and/or diagnosis of SARS-CoV-2 by FDA under an Emergency Use Authorization (EUA). This EUA will remain  in effect (meaning this test can be used) for the duration of the COVID-19 declaration under Section 564(b)(1) of the Act, 21 U.S.C.section 360bbb-3(b)(1), unless the authorization is terminated  or revoked sooner.       Influenza A by PCR NEGATIVE NEGATIVE  Final   Influenza B by PCR NEGATIVE NEGATIVE Final    Comment: (NOTE) The Xpert Xpress SARS-CoV-2/FLU/RSV plus assay is intended as an aid in the diagnosis of influenza from Nasopharyngeal swab specimens and should not be used as a sole basis for treatment. Nasal washings and aspirates are unacceptable for Xpert Xpress SARS-CoV-2/FLU/RSV testing.  Fact Sheet for Patients: EntrepreneurPulse.com.au  Fact Sheet for Healthcare Providers: IncredibleEmployment.be  This test is not yet approved or cleared by the [place] FDA and has been authorized for detection and/or diagnosis of SARS-CoV-2 by FDA under an Emergency Use Authorization (EUA). This EUA will remain in effect (meaning this test can be used) for the duration of the COVID-19 declaration under Section 564(b)(1) of the Act, 21 U.S.C. section 360bbb-3(b)(1), unless the authorization is terminated or revoked.  Performed at [facility], [place] [address], [place], [place] [address]      Radiology Studies: No results found. US RENAL  Final Result    MR BRAIN WO CONTRAST  Final Result    MR ANGIO HEAD WO CONTRAST  Final Result    MR ANGIO NECK W WO CONTRAST  Final Result    CT HEAD WO CONTRAST  Final Result  Addendum 1 of 1  ADDENDUM REPORT: 08/02/2020 15:56    ADDENDUM:  On further review, decreased attenuation is noted in the medial  right temporal-parietal junction region concerning for acute infarct  in this area.      Electronically Signed    By: Lowella Grip III M.D.    On: 08/02/2020 15:56      Final      Scheduled Meds:  apixaban  2.5 mg Oral BID   carvedilol  12.5 mg Oral BID WC   cholecalciferol  1,000 Units Oral Daily   pantoprazole  40 mg Oral Daily   polyethylene glycol  17 g Oral Daily   QUEtiapine  12.5 mg Oral BID   rosuvastatin  40 mg Oral Daily   senna-docusate  1 tablet Oral BID   tamsulosin  0.4 mg Oral Daily   PRN  Meds: acetaminophen **OR** acetaminophen (TYLENOL) oral liquid 160 mg/5 mL **OR** acetaminophen, haloperidol lactate, zolpidem Continuous Infusions:   LOS: 9 days  Time spent: Greater than 50% of the 35 minute visit was spent in counseling/coordination of care for the patient as laid out in the A&P.   Dwyane Dee, MD [facility] 08/11/2020, 1:30 PM

## 2020-08-12 LAB — BASIC METABOLIC PANEL
Anion gap: 10 (ref 5–15)
BUN: 26 mg/dL — ABNORMAL HIGH (ref 8–23)
CO2: 20 mmol/L — ABNORMAL LOW (ref 22–32)
Calcium: 8.2 mg/dL — ABNORMAL LOW (ref 8.9–10.3)
Chloride: 108 mmol/L (ref 98–111)
Creatinine, Ser: 1.64 mg/dL — ABNORMAL HIGH (ref 0.61–1.24)
GFR, Estimated: 40 mL/min — ABNORMAL LOW (ref >60)
Glucose, Bld: 103 mg/dL — ABNORMAL HIGH (ref 70–99)
Potassium: 3.8 mmol/L (ref 3.5–5.1)
Sodium: 138 mmol/L (ref 135–145)

## 2020-08-12 LAB — CBC WITH DIFFERENTIAL/PLATELET
Abs Immature Granulocytes: 0.18 10*3/uL — ABNORMAL HIGH (ref 0.00–0.07)
Basophils Absolute: 0 10*3/uL (ref 0.0–0.1)
Basophils Relative: 0 %
Eosinophils Absolute: 0 10*3/uL (ref 0.0–0.5)
Eosinophils Relative: 1 %
HCT: 34.5 % — ABNORMAL LOW (ref 39.0–52.0)
Hemoglobin: 11 g/dL — ABNORMAL LOW (ref 13.0–17.0)
Immature Granulocytes: 2 %
Lymphocytes Relative: 24 %
Lymphs Abs: 1.8 10*3/uL (ref 0.7–4.0)
MCH: 26.8 pg (ref 26.0–34.0)
MCHC: 31.9 g/dL (ref 30.0–36.0)
MCV: 83.9 fL (ref 80.0–100.0)
Monocytes Absolute: 1.1 10*3/uL — ABNORMAL HIGH (ref 0.1–1.0)
Monocytes Relative: 15 %
Neutro Abs: 4.3 10*3/uL (ref 1.7–7.7)
Neutrophils Relative %: 58 %
Platelets: 213 10*3/uL (ref 150–400)
RBC: 4.11 MIL/uL — ABNORMAL LOW (ref 4.22–5.81)
RDW: 15.9 % — ABNORMAL HIGH (ref 11.5–15.5)
WBC: 7.4 10*3/uL (ref 4.0–10.5)
nRBC: 0 % (ref 0.0–0.2)

## 2020-08-12 LAB — MAGNESIUM: Magnesium: 1.9 mg/dL (ref 1.7–2.4)

## 2020-08-12 MED ORDER — AMLODIPINE BESYLATE 5 MG PO TABS
5.0000 mg | ORAL_TABLET | Freq: Every day | ORAL | Status: DC
  Administered 2020-08-12 – 2020-08-14 (×3): 5 mg via ORAL
  Filled 2020-08-12 (×3): qty 1

## 2020-08-12 NOTE — Plan of Care (Signed)
°  Problem: Education: Goal: Individualized Educational Video(s) Outcome: Progressing   Problem: Education: Goal: Individualized Educational Video(s) Outcome: Progressing   Problem: Education: Goal: Knowledge of secondary prevention will improve Outcome: Progressing   Problem: Education: Goal: Knowledge of disease or condition will improve Outcome: Progressing

## 2020-08-12 NOTE — Progress Notes (Signed)
PROGRESS NOTE    ANIKETH GRASSL   [id number]  DOB: July 07, 1930  DOA: 08/02/2020     10  PCP: Lawerance Cruel, MD  CC: weakness  Hospital Course: Mr. Bjornson is an 86 yo male with PMH A. fib not on anticoagulation secondary to GI bleed in past, CKD 3B, AAA, HLD, chronic anemia who presented with left-sided weakness.  After work-up he was found to have a moderate sized right PCA infarct involving the temporooccipital lobes and occlusion of the right P2 PCA.  There were also small acute cortical infarcts involving the left frontal lobe and left parieto-occipital junction.  Interval History:  No events overnight.  Patient states he slept well again.  They have accepted a tentative bed offer for [facility] at this time.  Tentative plan for discharge on Monday. Other daughter present this am.  Update given and questions answered.  Old records reviewed in assessment of this patient  ROS: Constitutional: negative for chills and fevers, Respiratory: negative for cough, Cardiovascular: negative for chest pain and Gastrointestinal: negative for abdominal pain  Assessment & Plan: * CVA (cerebral vascular accident) ([place]) - per MRI brain: "Moderate size right PCA territory acute infarction involving temporo-occipital lobes. Occlusion of the right P2 PCA. Small acute cortical infarcts of the left frontal lobe and left parieto-occipital junction." -Echo: [id number]; diastolic function unable to be evaluated -PT/OT recommend CIR; patient has been improving steadily since admission and by the time CIR gets bed, may be too high functioning; patient's daughter and the situation would want him to probably go to an alternative rehab facility as she states thresholds in the house are not wide enough for a walker so he would need to no longer require a walker prior to going home; details of discussion sent to case management to initiate process -Patient now on Eliquis in setting of presumed embolic etiology  from underlying A. Fib (anticoagulation was discussed with GI and vascular surgery prior to initiation) - patient and family have accepted bed offer at [facility]; tentative plan for d/c on Monday  PAF (paroxysmal atrial fibrillation) ([place]) -Patient converted spontaneously back to normal sinus rhythm.  Now rate controlled -Continue Eliquis, see discussion above -Hemoglobin remained stable -Continue Coreg  Acute renal failure superimposed on stage 3b chronic kidney disease ([place]) -Baseline creatinine approximately 1.3-1.5 -Normal renal ultrasound -Patient also voiding well with normal urine output -Previously on Lasix and lisinopril outpatient (on hold for now)  Sundowning -Continue Seroquel -Continue as needed Haldol  History of GI bleed EGD 2019 shows nonbleeding gastric ulcer with visible blood vessel. Treated with cautery. No further bleeding since then. Patient is not on anticoagulation since then. Discussed with GI. Currently plan is to initiate anticoagulation and monitor. Appreciate assistance. Continue PPI.  HLD (hyperlipidemia) -Continue Crestor  Essential hypertension -Lisinopril on hold for now in setting of elevated creatinine -Continue Coreg and will dose adjust as needed - adding amlodipine   AAA (abdominal aortic aneurysm) without rupture (HCC) -Continue Coreg and Crestor  Tobacco abuse -Now on nicotine patch.  Patient willing to quit  Antimicrobials:   DVT prophylaxis: Eliquis Code Status: Full Family Communication: Daughter bedside Disposition Plan: Status is: Inpatient  Remains inpatient appropriate because:Inpatient level of care appropriate due to severity of illness   Dispo: The patient is from: Home              Anticipated d/c is to: [facility]               Anticipated d/c  date is: Monday              Patient currently is medically stable to d/c.   Objective: Blood pressure (!) 167/95, pulse 94, temperature (!) 97.5 F (36.4 C),  temperature source Oral, resp. rate 18, height 5\' 9"  (1.753 m), weight 61.2 kg, SpO2 96 %.  Examination: General appearance: alert, cooperative and no distress Head: Normocephalic, without obvious abnormality, atraumatic Eyes: EOMI Lungs: clear to auscultation bilaterally Heart: regular rate and rhythm and S1, S2 normal Abdomen: normal findings: bowel sounds normal and soft, non-tender Extremities: no edema Skin: mobility and turgor normal Neurologic: LLE/LUE 4/5, otherwise 5/5 in remaining; sensation symmetric and normal  Consultants:   Neuro  Procedures:     Data Reviewed: I have personally reviewed following labs and imaging studies Results for orders placed or performed during the hospital encounter of 08/02/20 (from the past 24 hour(s))  Basic metabolic panel     Status: Abnormal   Collection Time: 08/12/20  3:20 AM  Result Value Ref Range   Sodium 138 135 - 145 mmol/L   Potassium 3.8 3.5 - 5.1 mmol/L   Chloride 108 98 - 111 mmol/L   CO2 20 (L) 22 - 32 mmol/L   Glucose, Bld 103 (H) 70 - 99 mg/dL   BUN 26 (H) 8 - 23 mg/dL   Creatinine, Ser 1.64 (H) 0.61 - 1.24 mg/dL   Calcium 8.2 (L) 8.9 - 10.3 mg/dL   GFR, Estimated 40 (L) >60 mL/min   Anion gap 10 5 - 15  CBC with Differential/Platelet     Status: Abnormal   Collection Time: 08/12/20  3:20 AM  Result Value Ref Range   WBC 7.4 4.0 - 10.5 K/uL   RBC 4.11 (L) 4.22 - 5.81 MIL/uL   Hemoglobin 11.0 (L) 13.0 - 17.0 g/dL   HCT 34.5 (L) 39.0 - 52.0 %   MCV 83.9 80.0 - 100.0 fL   MCH 26.8 26.0 - 34.0 pg   MCHC 31.9 30.0 - 36.0 g/dL   RDW 15.9 (H) 11.5 - 15.5 %   Platelets 213 150 - 400 K/uL   nRBC 0.0 0.0 - 0.2 %   Neutrophils Relative % 58 %   Neutro Abs 4.3 1.7 - 7.7 K/uL   Lymphocytes Relative 24 %   Lymphs Abs 1.8 0.7 - 4.0 K/uL   Monocytes Relative 15 %   Monocytes Absolute 1.1 (H) 0.1 - 1.0 K/uL   Eosinophils Relative 1 %   Eosinophils Absolute 0.0 0.0 - 0.5 K/uL   Basophils Relative 0 %   Basophils Absolute  0.0 0.0 - 0.1 K/uL   Immature Granulocytes 2 %   Abs Immature Granulocytes 0.18 (H) 0.00 - 0.07 K/uL  Magnesium     Status: None   Collection Time: 08/12/20  3:20 AM  Result Value Ref Range   Magnesium 1.9 1.7 - 2.4 mg/dL    Recent Results (from the past 240 hour(s))  Resp Panel by RT-PCR (Flu A&B, Covid) Nasopharyngeal Swab     Status: None   Collection Time: 08/02/20  2:00 PM   Specimen: Nasopharyngeal Swab; Nasopharyngeal(NP) swabs in vial transport medium  Result Value Ref Range Status   SARS Coronavirus 2 by RT PCR NEGATIVE NEGATIVE Final    Comment: (NOTE) SARS-CoV-2 target nucleic acids are NOT DETECTED.  The SARS-CoV-2 RNA is generally detectable in upper respiratory specimens during the acute phase of infection. The lowest concentration of SARS-CoV-2 viral copies this assay can detect is 138 copies/mL.  A negative result does not preclude SARS-Cov-2 infection and should not be used as the sole basis for treatment or other patient management decisions. A negative result may occur with  improper specimen collection/handling, submission of specimen other than nasopharyngeal swab, presence of viral mutation(s) within the areas targeted by this assay, and inadequate number of viral copies(<138 copies/mL). A negative result must be combined with clinical observations, patient history, and epidemiological information. The expected result is Negative.  Fact Sheet for Patients:  EntrepreneurPulse.com.au  Fact Sheet for Healthcare Providers:  IncredibleEmployment.be  This test is no t yet approved or cleared by the [place] FDA and  has been authorized for detection and/or diagnosis of SARS-CoV-2 by FDA under an Emergency Use Authorization (EUA). This EUA will remain  in effect (meaning this test can be used) for the duration of the COVID-19 declaration under Section 564(b)(1) of the Act, 21 U.S.C.section 360bbb-3(b)(1), unless the  authorization is terminated  or revoked sooner.       Influenza A by PCR NEGATIVE NEGATIVE Final   Influenza B by PCR NEGATIVE NEGATIVE Final    Comment: (NOTE) The Xpert Xpress SARS-CoV-2/FLU/RSV plus assay is intended as an aid in the diagnosis of influenza from Nasopharyngeal swab specimens and should not be used as a sole basis for treatment. Nasal washings and aspirates are unacceptable for Xpert Xpress SARS-CoV-2/FLU/RSV testing.  Fact Sheet for Patients: EntrepreneurPulse.com.au  Fact Sheet for Healthcare Providers: IncredibleEmployment.be  This test is not yet approved or cleared by the [place] FDA and has been authorized for detection and/or diagnosis of SARS-CoV-2 by FDA under an Emergency Use Authorization (EUA). This EUA will remain in effect (meaning this test can be used) for the duration of the COVID-19 declaration under Section 564(b)(1) of the Act, 21 U.S.C. section 360bbb-3(b)(1), unless the authorization is terminated or revoked.  Performed at [facility], [place] [address], [place], [place] [address]      Radiology Studies: No results found. US RENAL  Final Result    MR BRAIN WO CONTRAST  Final Result    MR ANGIO HEAD WO CONTRAST  Final Result    MR ANGIO NECK W WO CONTRAST  Final Result    CT HEAD WO CONTRAST  Final Result  Addendum 1 of 1  ADDENDUM REPORT: 08/02/2020 15:56    ADDENDUM:  On further review, decreased attenuation is noted in the medial  right temporal-parietal junction region concerning for acute infarct  in this area.      Electronically Signed    By: Lowella Grip III M.D.    On: 08/02/2020 15:56      Final      Scheduled Meds: . amLODipine  5 mg Oral Daily  . apixaban  2.5 mg Oral BID  . carvedilol  12.5 mg Oral BID WC  . cholecalciferol  1,000 Units Oral Daily  . pantoprazole  40 mg Oral Daily  . polyethylene glycol  17 g Oral Daily  . QUEtiapine  12.5 mg  Oral BID  . rosuvastatin  40 mg Oral Daily  . senna-docusate  1 tablet Oral BID  . tamsulosin  0.4 mg Oral Daily   PRN Meds: acetaminophen **OR** acetaminophen (TYLENOL) oral liquid 160 mg/5 mL **OR** acetaminophen, haloperidol lactate, zolpidem Continuous Infusions:   LOS: 10 days  Time spent: Greater than 50% of the 35 minute visit was spent in counseling/coordination of care for the patient as laid out in the A&P.   Dwyane Dee, MD [facility] 08/12/2020, 11:45 AM

## 2020-08-12 NOTE — Plan of Care (Signed)
  Problem: Ischemic Stroke/TIA Tissue Perfusion: Goal: Complications of ischemic stroke/TIA will be minimized Outcome: Progressing   Problem: Education: Goal: Knowledge of General Education information will improve Description: Including pain rating scale, medication(s)/side effects and non-pharmacologic comfort measures Outcome: Progressing   Problem: Nutrition: Goal: Adequate nutrition will be maintained Outcome: Progressing   Problem: Pain Managment: Goal: General experience of comfort will improve Outcome: Progressing

## 2020-08-13 LAB — CBC WITH DIFFERENTIAL/PLATELET
Abs Immature Granulocytes: 0.16 10*3/uL — ABNORMAL HIGH (ref 0.00–0.07)
Basophils Absolute: 0 10*3/uL (ref 0.0–0.1)
Basophils Relative: 0 %
Eosinophils Absolute: 0 10*3/uL (ref 0.0–0.5)
Eosinophils Relative: 1 %
HCT: 34.1 % — ABNORMAL LOW (ref 39.0–52.0)
Hemoglobin: 10.6 g/dL — ABNORMAL LOW (ref 13.0–17.0)
Immature Granulocytes: 2 %
Lymphocytes Relative: 25 %
Lymphs Abs: 1.9 10*3/uL (ref 0.7–4.0)
MCH: 26 pg (ref 26.0–34.0)
MCHC: 31.1 g/dL (ref 30.0–36.0)
MCV: 83.8 fL (ref 80.0–100.0)
Monocytes Absolute: 1.2 10*3/uL — ABNORMAL HIGH (ref 0.1–1.0)
Monocytes Relative: 16 %
Neutro Abs: 4.1 10*3/uL (ref 1.7–7.7)
Neutrophils Relative %: 56 %
Platelets: 229 10*3/uL (ref 150–400)
RBC: 4.07 MIL/uL — ABNORMAL LOW (ref 4.22–5.81)
RDW: 15.8 % — ABNORMAL HIGH (ref 11.5–15.5)
WBC: 7.3 10*3/uL (ref 4.0–10.5)
nRBC: 0 % (ref 0.0–0.2)

## 2020-08-13 LAB — BASIC METABOLIC PANEL
Anion gap: 9 (ref 5–15)
BUN: 25 mg/dL — ABNORMAL HIGH (ref 8–23)
CO2: 21 mmol/L — ABNORMAL LOW (ref 22–32)
Calcium: 8.1 mg/dL — ABNORMAL LOW (ref 8.9–10.3)
Chloride: 108 mmol/L (ref 98–111)
Creatinine, Ser: 1.67 mg/dL — ABNORMAL HIGH (ref 0.61–1.24)
GFR, Estimated: 39 mL/min — ABNORMAL LOW (ref >60)
Glucose, Bld: 98 mg/dL (ref 70–99)
Potassium: 3.9 mmol/L (ref 3.5–5.1)
Sodium: 138 mmol/L (ref 135–145)

## 2020-08-13 LAB — MAGNESIUM: Magnesium: 1.8 mg/dL (ref 1.7–2.4)

## 2020-08-13 NOTE — Plan of Care (Signed)
progressing 

## 2020-08-13 NOTE — Plan of Care (Signed)
Pt alert and oriented. IT consultant at bedside. Pt is on RA. No concerns over night.   Problem: Education: Goal: Knowledge of disease or condition will improve Outcome: Progressing Goal: Knowledge of secondary prevention will improve Outcome: Progressing Goal: Knowledge of patient specific risk factors addressed and post discharge goals established will improve Outcome: Progressing Goal: Individualized Educational Video(s) Outcome: Progressing   Problem: Coping: Goal: Will verbalize positive feelings about self Outcome: Progressing Goal: Will identify appropriate support needs Outcome: Progressing   Problem: Health Behavior/Discharge Planning: Goal: Ability to manage health-related needs will improve Outcome: Progressing   Problem: Self-Care: Goal: Ability to participate in self-care as condition permits will improve Outcome: Progressing Goal: Verbalization of feelings and concerns over difficulty with self-care will improve Outcome: Progressing Goal: Ability to communicate needs accurately will improve Outcome: Progressing   Problem: Nutrition: Goal: Risk of aspiration will decrease Outcome: Progressing Goal: Dietary intake will improve Outcome: Progressing   Problem: Ischemic Stroke/TIA Tissue Perfusion: Goal: Complications of ischemic stroke/TIA will be minimized Outcome: Progressing   Problem: Education: Goal: Knowledge of General Education information will improve Description: Including pain rating scale, medication(s)/side effects and non-pharmacologic comfort measures Outcome: Progressing   Problem: Health Behavior/Discharge Planning: Goal: Ability to manage health-related needs will improve Outcome: Progressing   Problem: Clinical Measurements: Goal: Ability to maintain clinical measurements within normal limits will improve Outcome: Progressing Goal: Will remain free from infection Outcome: Progressing Goal: Diagnostic test results will  improve Outcome: Progressing Goal: Respiratory complications will improve Outcome: Progressing Goal: Cardiovascular complication will be avoided Outcome: Progressing   Problem: Activity: Goal: Risk for activity intolerance will decrease Outcome: Progressing   Problem: Nutrition: Goal: Adequate nutrition will be maintained Outcome: Progressing   Problem: Coping: Goal: Level of anxiety will decrease Outcome: Progressing   Problem: Elimination: Goal: Will not experience complications related to bowel motility Outcome: Progressing Goal: Will not experience complications related to urinary retention Outcome: Progressing   Problem: Pain Managment: Goal: General experience of comfort will improve Outcome: Progressing   Problem: Safety: Goal: Ability to remain free from injury will improve Outcome: Progressing   Problem: Skin Integrity: Goal: Risk for impaired skin integrity will decrease Outcome: Progressing

## 2020-08-13 NOTE — Progress Notes (Signed)
PROGRESS NOTE    Chris Peterson   GXQ:[id number]  DOB: 06-14-1930  DOA: 08/02/2020     11  PCP: Lawerance Cruel, MD  CC: weakness  Hospital Course: [name] is an 86 yo male with PMH A. fib not on anticoagulation secondary to GI bleed in past, CKD 3B, AAA, HLD, chronic anemia who presented with left-sided weakness.  After work-up he was found to have a moderate sized right PCA infarct involving the temporooccipital lobes and occlusion of the right P2 PCA.  There were also small acute cortical infarcts involving the left frontal lobe and left parieto-occipital junction.  Interval History:  No events overnight. Doing well this morning.  Tentative plan for d/c Monday if facility found that family agrees with.   Old records reviewed in assessment of this patient  ROS: Constitutional: negative for chills and fevers, Respiratory: negative for cough, Cardiovascular: negative for chest pain and Gastrointestinal: negative for abdominal pain  Assessment & Plan: * CVA (cerebral vascular accident) ([place]) - per MRI brain: "Moderate size right PCA territory acute infarction involving temporo-occipital lobes. Occlusion of the right P2 PCA. Small acute cortical infarcts of the left frontal lobe and left parieto-occipital junction." -Echo: XK<48%; diastolic function unable to be evaluated -PT/OT recommend CIR; patient has been improving steadily since admission and by the time CIR gets bed, may be too high functioning; patient's daughter and the situation would want him to probably go to an alternative rehab facility as she states thresholds in the house are not wide enough for a walker so he would need to no longer require a walker prior to going home; details of discussion sent to case management to initiate process -Patient now on Eliquis in setting of presumed embolic etiology from underlying A. Fib (anticoagulation was discussed with GI and vascular surgery prior to initiation) - patient and  family have accepted bed offer at [facility]; tentative plan for d/c on Monday  PAF (paroxysmal atrial fibrillation) ([place]) -Patient converted spontaneously back to normal sinus rhythm.  Now rate controlled -Continue Eliquis, see discussion above -Hemoglobin remained stable -Continue Coreg  Acute renal failure superimposed on stage 3b chronic kidney disease ([place]) -Baseline creatinine approximately 1.3-1.5 -Normal renal ultrasound -Patient also voiding well with normal urine output -Previously on Lasix and lisinopril outpatient (on hold for now)  Sundowning -Continue Seroquel -Continue as needed Haldol  History of GI bleed EGD 2019 shows nonbleeding gastric ulcer with visible blood vessel. Treated with cautery. No further bleeding since then. Patient is not on anticoagulation since then. Discussed with GI. Currently plan is to initiate anticoagulation and monitor. Appreciate assistance. Continue PPI.  HLD (hyperlipidemia) -Continue Crestor  Essential hypertension -Lisinopril on hold for now in setting of elevated creatinine -Continue Coreg and will dose adjust as needed - adding amlodipine   AAA (abdominal aortic aneurysm) without rupture (H[facility]) -Continue Coreg and Crestor  Tobacco abuse -Now on nicotine patch.  Patient willing to quit  Antimicrobials:   DVT prophylaxis: Eliquis Code Status: Full Family Communication: Daughter bedside Disposition Plan: Status is: Inpatient  Remains inpatient appropriate because:Inpatient level of care appropriate due to severity of illness   Dispo: The patient is from: Home              Anticipated d/c is to: [facility]               Anticipated d/c date is: Monday tentatively              Patient currently is  medically stable to d/c.   Objective: Blood pressure 116/67, pulse 70, temperature 97.8 F (36.6 C), temperature source Oral, resp. rate 17, height 5\' 9"  (1.753 m), weight 61.2 kg, SpO2 94 %.  Examination: General  appearance: alert, cooperative and no distress Head: Normocephalic, without obvious abnormality, atraumatic Eyes: EOMI Lungs: clear to auscultation bilaterally Heart: regular rate and rhythm and S1, S2 normal Abdomen: normal findings: bowel sounds normal and soft, non-tender Extremities: no edema Skin: mobility and turgor normal Neurologic: LLE/LUE 4/5, otherwise 5/5 in remaining; sensation symmetric and normal  Consultants:   Neuro  Procedures:     Data Reviewed: I have personally reviewed following labs and imaging studies Results for orders placed or performed during the hospital encounter of 08/02/20 (from the past 24 hour(s))  Basic metabolic panel     Status: Abnormal   Collection Time: 08/13/20  2:30 AM  Result Value Ref Range   Sodium 138 135 - 145 mmol/L   Potassium 3.9 3.5 - 5.1 mmol/L   Chloride 108 98 - 111 mmol/L   CO2 21 (L) 22 - 32 mmol/L   Glucose, Bld 98 70 - 99 mg/dL   BUN 25 (H) 8 - 23 mg/dL   Creatinine, Ser 1.44 (H) 0.61 - 1.24 mg/dL   Calcium 8.1 (L) 8.9 - 10.3 mg/dL   GFR, Estimated 39 (L) >60 mL/min   Anion gap 9 5 - 15  CBC with Differential/Platelet     Status: Abnormal   Collection Time: 08/13/20  2:30 AM  Result Value Ref Range   WBC 7.3 4.0 - 10.5 K/uL   RBC 4.07 (L) 4.22 - 5.81 MIL/uL   Hemoglobin 10.6 (L) 13.0 - 17.0 g/dL   HCT 81.8 (L) 56.3 - 14.9 %   MCV 83.8 80.0 - 100.0 fL   MCH 26.0 26.0 - 34.0 pg   MCHC 31.1 30.0 - 36.0 g/dL   RDW 70.2 (H) 63.7 - 85.8 %   Platelets 229 150 - 400 K/uL   nRBC 0.0 0.0 - 0.2 %   Neutrophils Relative % 56 %   Neutro Abs 4.1 1.7 - 7.7 K/uL   Lymphocytes Relative 25 %   Lymphs Abs 1.9 0.7 - 4.0 K/uL   Monocytes Relative 16 %   Monocytes Absolute 1.2 (H) 0.1 - 1.0 K/uL   Eosinophils Relative 1 %   Eosinophils Absolute 0.0 0.0 - 0.5 K/uL   Basophils Relative 0 %   Basophils Absolute 0.0 0.0 - 0.1 K/uL   Immature Granulocytes 2 %   Abs Immature Granulocytes 0.16 (H) 0.00 - 0.07 K/uL  Magnesium      Status: None   Collection Time: 08/13/20  2:30 AM  Result Value Ref Range   Magnesium 1.8 1.7 - 2.4 mg/dL    No results found for this or any previous visit (from the past 240 hour(s)).   Radiology Studies: No results found. US RENAL  Final Result    MR BRAIN WO CONTRAST  Final Result    MR ANGIO HEAD WO CONTRAST  Final Result    MR ANGIO NECK W WO CONTRAST  Final Result    CT HEAD WO CONTRAST  Final Result  Addendum 1 of 1  ADDENDUM REPORT: 08/02/2020 15:56    ADDENDUM:  On further review, decreased attenuation is noted in the medial  right temporal-parietal junction region concerning for acute infarct  in this area.      Electronically Signed    By: Bretta Bang III M.D.  On: 08/02/2020 15:56      Final      Scheduled Meds:  amLODipine  5 mg Oral Daily   apixaban  2.5 mg Oral BID   carvedilol  12.5 mg Oral BID WC   cholecalciferol  1,000 Units Oral Daily   pantoprazole  40 mg Oral Daily   polyethylene glycol  17 g Oral Daily   QUEtiapine  12.5 mg Oral BID   rosuvastatin  40 mg Oral Daily   senna-docusate  1 tablet Oral BID   tamsulosin  0.4 mg Oral Daily   PRN Meds: acetaminophen **OR** acetaminophen (TYLENOL) oral liquid 160 mg/5 mL **OR** acetaminophen, haloperidol lactate, zolpidem Continuous Infusions:   LOS: 11 days  Time spent: Greater than 50% of the 35 minute visit was spent in counseling/coordination of care for the patient as laid out in the A&P.   Dwyane Dee, MD [facility] 08/13/2020, 11:55 AM

## 2020-08-13 NOTE — TOC Progression Note (Signed)
Transition of Care [facility]) - Progression Note    Patient Details  Name: Chris Peterson MRN: [id number] Date of Birth: 1930/05/15  Transition of Care [facility]) CM/SW [place], [place] Phone Number: 08/13/2020, 10:00 AM  Clinical Narrative:     CSW requested updated Covid test and confirmed with [facility] that they can accept tomorrow. SW will continue to follow for DC.  Expected Discharge Plan: Skilled Nursing Facility Barriers to Discharge: SNF Pending bed offer  Expected Discharge Plan and Services Expected Discharge Plan: [place] In-house Referral: Clinical Social Work Discharge Planning Services: CM Consult Post Acute Care Choice: [place] arrangements for the past 2 months: Single Family Home                                       Social Determinants of Health (SDOH) Interventions    Readmission Risk Interventions No flowsheet data found.

## 2020-08-14 DIAGNOSIS — Z8249 Family history of ischemic heart disease and other diseases of the circulatory system: Secondary | ICD-10-CM | POA: Diagnosis not present

## 2020-08-14 DIAGNOSIS — Z95828 Presence of other vascular implants and grafts: Secondary | ICD-10-CM | POA: Diagnosis not present

## 2020-08-14 DIAGNOSIS — Z7901 Long term (current) use of anticoagulants: Secondary | ICD-10-CM | POA: Diagnosis not present

## 2020-08-14 DIAGNOSIS — E1169 Type 2 diabetes mellitus with other specified complication: Secondary | ICD-10-CM | POA: Diagnosis not present

## 2020-08-14 DIAGNOSIS — I10 Essential (primary) hypertension: Secondary | ICD-10-CM | POA: Diagnosis not present

## 2020-08-14 DIAGNOSIS — K253 Acute gastric ulcer without hemorrhage or perforation: Secondary | ICD-10-CM | POA: Diagnosis present

## 2020-08-14 DIAGNOSIS — E785 Hyperlipidemia, unspecified: Secondary | ICD-10-CM | POA: Diagnosis present

## 2020-08-14 DIAGNOSIS — Z8719 Personal history of other diseases of the digestive system: Secondary | ICD-10-CM | POA: Diagnosis not present

## 2020-08-14 DIAGNOSIS — Z20822 Contact with and (suspected) exposure to covid-19: Secondary | ICD-10-CM | POA: Diagnosis present

## 2020-08-14 DIAGNOSIS — Z515 Encounter for palliative care: Secondary | ICD-10-CM | POA: Diagnosis not present

## 2020-08-14 DIAGNOSIS — J189 Pneumonia, unspecified organism: Secondary | ICD-10-CM | POA: Diagnosis present

## 2020-08-14 DIAGNOSIS — R404 Transient alteration of awareness: Secondary | ICD-10-CM | POA: Diagnosis not present

## 2020-08-14 DIAGNOSIS — I63531 Cerebral infarction due to unspecified occlusion or stenosis of right posterior cerebral artery: Secondary | ICD-10-CM | POA: Diagnosis present

## 2020-08-14 DIAGNOSIS — R2689 Other abnormalities of gait and mobility: Secondary | ICD-10-CM | POA: Diagnosis present

## 2020-08-14 DIAGNOSIS — K649 Unspecified hemorrhoids: Secondary | ICD-10-CM | POA: Diagnosis present

## 2020-08-14 DIAGNOSIS — M19012 Primary osteoarthritis, left shoulder: Secondary | ICD-10-CM | POA: Diagnosis present

## 2020-08-14 DIAGNOSIS — M2550 Pain in unspecified joint: Secondary | ICD-10-CM | POA: Diagnosis not present

## 2020-08-14 DIAGNOSIS — R52 Pain, unspecified: Secondary | ICD-10-CM | POA: Diagnosis not present

## 2020-08-14 DIAGNOSIS — F05 Delirium due to known physiological condition: Secondary | ICD-10-CM | POA: Diagnosis present

## 2020-08-14 DIAGNOSIS — I639 Cerebral infarction, unspecified: Secondary | ICD-10-CM | POA: Diagnosis present

## 2020-08-14 DIAGNOSIS — M25519 Pain in unspecified shoulder: Secondary | ICD-10-CM | POA: Diagnosis not present

## 2020-08-14 DIAGNOSIS — I679 Cerebrovascular disease, unspecified: Secondary | ICD-10-CM | POA: Diagnosis not present

## 2020-08-14 DIAGNOSIS — T68XXXA Hypothermia, initial encounter: Secondary | ICD-10-CM | POA: Diagnosis present

## 2020-08-14 DIAGNOSIS — I313 Pericardial effusion (noninflammatory): Secondary | ICD-10-CM | POA: Diagnosis present

## 2020-08-14 DIAGNOSIS — I718 Aortic aneurysm of unspecified site, ruptured: Secondary | ICD-10-CM | POA: Diagnosis present

## 2020-08-14 DIAGNOSIS — I5032 Chronic diastolic (congestive) heart failure: Secondary | ICD-10-CM | POA: Diagnosis present

## 2020-08-14 DIAGNOSIS — N1832 Chronic kidney disease, stage 3b: Secondary | ICD-10-CM | POA: Diagnosis present

## 2020-08-14 DIAGNOSIS — Z7401 Bed confinement status: Secondary | ICD-10-CM | POA: Diagnosis not present

## 2020-08-14 DIAGNOSIS — R4182 Altered mental status, unspecified: Secondary | ICD-10-CM | POA: Diagnosis not present

## 2020-08-14 DIAGNOSIS — I130 Hypertensive heart and chronic kidney disease with heart failure and stage 1 through stage 4 chronic kidney disease, or unspecified chronic kidney disease: Secondary | ICD-10-CM | POA: Diagnosis present

## 2020-08-14 DIAGNOSIS — I63431 Cerebral infarction due to embolism of right posterior cerebral artery: Secondary | ICD-10-CM | POA: Diagnosis not present

## 2020-08-14 DIAGNOSIS — M6281 Muscle weakness (generalized): Secondary | ICD-10-CM | POA: Diagnosis present

## 2020-08-14 DIAGNOSIS — F0390 Unspecified dementia without behavioral disturbance: Secondary | ICD-10-CM | POA: Diagnosis present

## 2020-08-14 DIAGNOSIS — I63432 Cerebral infarction due to embolism of left posterior cerebral artery: Secondary | ICD-10-CM | POA: Diagnosis present

## 2020-08-14 DIAGNOSIS — I480 Paroxysmal atrial fibrillation: Secondary | ICD-10-CM | POA: Diagnosis present

## 2020-08-14 DIAGNOSIS — Z79899 Other long term (current) drug therapy: Secondary | ICD-10-CM | POA: Diagnosis not present

## 2020-08-14 DIAGNOSIS — I6930 Unspecified sequelae of cerebral infarction: Secondary | ICD-10-CM | POA: Diagnosis not present

## 2020-08-14 DIAGNOSIS — N179 Acute kidney failure, unspecified: Secondary | ICD-10-CM | POA: Diagnosis present

## 2020-08-14 DIAGNOSIS — E1122 Type 2 diabetes mellitus with diabetic chronic kidney disease: Secondary | ICD-10-CM | POA: Diagnosis present

## 2020-08-14 DIAGNOSIS — Z8673 Personal history of transient ischemic attack (TIA), and cerebral infarction without residual deficits: Secondary | ICD-10-CM | POA: Diagnosis not present

## 2020-08-14 DIAGNOSIS — Z66 Do not resuscitate: Secondary | ICD-10-CM | POA: Diagnosis present

## 2020-08-14 DIAGNOSIS — I714 Abdominal aortic aneurysm, without rupture: Secondary | ICD-10-CM | POA: Diagnosis present

## 2020-08-14 LAB — RESP PANEL BY RT-PCR (FLU A&B, COVID) ARPGX2
Influenza A by PCR: NEGATIVE
Influenza B by PCR: NEGATIVE
SARS Coronavirus 2 by RT PCR: NEGATIVE

## 2020-08-14 MED ORDER — AMLODIPINE BESYLATE 5 MG PO TABS: 5.0000 mg | ORAL_TABLET | Freq: Every day | ORAL | Status: AC

## 2020-08-14 MED ORDER — SENNOSIDES-DOCUSATE SODIUM 8.6-50 MG PO TABS: 1.0000 | ORAL_TABLET | Freq: Two times a day (BID) | ORAL | Status: AC

## 2020-08-14 MED ORDER — QUETIAPINE FUMARATE 25 MG PO TABS: 12.5000 mg | ORAL_TABLET | Freq: Two times a day (BID) | ORAL | Status: AC

## 2020-08-14 MED ORDER — APIXABAN 2.5 MG PO TABS
2.5000 mg | ORAL_TABLET | Freq: Two times a day (BID) | ORAL | Status: AC
Start: 2020-08-14 — End: ?

## 2020-08-14 NOTE — TOC Progression Note (Signed)
Transition of Care [facility]) - Progression Note    Patient Details  Name: Chris Peterson MRN: [id number] Date of Birth: 08-11-1930  [facility]) CM/SW Contact  Mearl Latin, LCSW Phone Number: 08/14/2020, 11:44 AM  Clinical Narrative:    CSW confirmed [facility] is able to accept patient today. CSW spoke with patient's daughter, Aurea Graff, and she is in agreement with discharge today by [facility] pending COVID test.   Expected Discharge Plan: Skilled Nursing Facility Barriers to Discharge: SNF Pending bed offer  Expected Discharge Plan and Services Expected Discharge Plan: Skilled Nursing Facility In-house Referral: Clinical Social Work Discharge Planning Services: CM Consult Post Acute Care Choice: Skilled Nursing Facility Living arrangements for the past 2 months: Single Family Home                                       Social Determinants of Health (SDOH) Interventions    Readmission Risk Interventions No flowsheet data found.

## 2020-08-14 NOTE — Discharge Summary (Signed)
Physician Discharge Summary   Chris Peterson [id number] DOB: [birth year] DOA: 08/02/2020  PCP: Chris Cruel, MD  Admit date: 08/02/2020 Discharge date: 08/14/2020  Admitted From: home Disposition:  [place] Discharging physician: Chris Dee, MD  Recommendations for Outpatient Follow-up:  1. May need further BP regimen adjustment 2. Seroquel started in hospital for sundowning.  May not need when discharges back home from rehab   Patient discharged to [place] in Discharge Condition: stable CODE STATUS: Full Diet recommendation:  Diet Orders (From admission, onward)    Start     Ordered   08/14/20 0000  Diet - low sodium heart healthy        08/14/20 1322   08/03/20 0933  Diet Heart Room service appropriate? Yes; Fluid consistency: Thin  Diet effective now       Question Answer Comment  Room service appropriate? Yes   Fluid consistency: Thin      08/03/20 0933          Hospital Course: Mr. Both is an 86 yo male with PMH A. fib not on anticoagulation secondary to GI bleed in past, CKD 3B, AAA, HLD, chronic anemia who presented with left-sided weakness.  After work-up he was found to have a moderate sized right PCA infarct involving the temporooccipital lobes and occlusion of the right P2 PCA.  There were also small acute cortical infarcts involving the left frontal lobe and left parieto-occipital junction.  See below for A&P by problem.    * CVA (cerebral vascular accident) ([place]) - per MRI brain: "Moderate size right PCA territory acute infarction involving temporo-occipital lobes. Occlusion of the right P2 PCA. Small acute cortical infarcts of the left frontal lobe and left parieto-occipital junction." -Echo: [id number]; diastolic function unable to be evaluated -PT/OT recommend CIR; patient has been improving steadily since admission and by the time CIR gets bed, may be too high functioning; patient's daughter and the situation would want him to probably go  to an alternative rehab facility as she states thresholds in the house are not wide enough for a walker so he would need to no longer require a walker prior to going home; details of discussion sent to case management to initiate process -Patient now on Eliquis in setting of presumed embolic etiology from underlying A. Fib (anticoagulation was discussed with GI and vascular surgery prior to initiation) - patient and family have accepted bed offer at [facility]  PAF (paroxysmal atrial fibrillation) ([place]) -Patient converted spontaneously back to normal sinus rhythm.  Now rate controlled -Continue Eliquis, see discussion above -Hemoglobin remained stable -Continue Coreg  Acute renal failure superimposed on stage 3b chronic kidney disease ([place]) -Baseline creatinine approximately 1.3-1.5 -Normal renal ultrasound -Patient also voiding well with normal urine output -Previously on Lasix and lisinopril outpatient (on hold for now): Not resumed at discharge  Sundowning -Continue Seroquel -If remains stable at rehab with no further sundowning, could consider discontinuing Seroquel when he goes home from rehab  History of GI bleed EGD 2019 shows nonbleeding gastric ulcer with visible blood vessel. Treated with cautery. No further bleeding since then. Patient is not on anticoagulation since then. Discussed with GI. -Has tolerated Eliquis with stable hemoglobin and vitals Continue PPI.  HLD (hyperlipidemia) -Continue Crestor  Essential hypertension -Lisinopril on hold for now in setting of elevated creatinine -Continue Coreg and will dose adjust as needed - adding amlodipine  -BP more stable.  May need further adjustment  AAA (abdominal aortic aneurysm) without rupture (HCC) -Continue Coreg and Crestor  Tobacco abuse -Now on nicotine patch.  Patient willing to quit    Principal Diagnosis: CVA (cerebral vascular accident) [facility])  Discharge Diagnoses: Active Hospital Problems    Diagnosis Date Noted  . CVA (cerebral vascular accident) ([place]) 08/02/2020    Priority: High  . PAF (paroxysmal atrial fibrillation) (HCC)     Priority: Medium  . Acute renal failure superimposed on stage 3b chronic kidney disease ([place]) 08/08/2020    Priority: Low  . HLD (hyperlipidemia) 08/08/2020  . History of GI bleed 08/08/2020  . Sundowning 08/08/2020  . AAA (abdominal aortic aneurysm) without rupture ([place])   . Essential hypertension   . Alcohol abuse   . Tobacco abuse 11/28/2011    Resolved Hospital Problems  No resolved problems to display.    Discharge Instructions    Ambulatory referral to Neurology   Complete by: As directed    Follow up with stroke clinic NP (Chris Peterson or Chris Peterson, if both not available, consider Chris Peterson, or Chris Peterson) at [facility] in about 4 weeks. Thanks.   Diet - low sodium heart healthy   Complete by: As directed    Increase activity slowly   Complete by: As directed      Allergies as of 08/14/2020   No Known Allergies     Medication List    STOP taking these medications   furosemide 40 MG tablet Commonly known as: LASIX   lisinopril 10 MG tablet Commonly known as: ZESTRIL     TAKE these medications   amLODipine 5 MG tablet Commonly known as: NORVASC Take 1 tablet (5 mg total) by mouth daily. Start taking on: August 15, 2020   apixaban 2.5 MG Tabs tablet Commonly known as: ELIQUIS Take 1 tablet (2.5 mg total) by mouth 2 (two) times daily.   carvedilol 12.5 MG tablet Commonly known as: COREG Take 12.5 mg by mouth 2 (two) times daily.   cholecalciferol 25 MCG (1000 UNIT) tablet Commonly known as: VITAMIN D Take 1 tablet (1,000 Units total) by mouth daily.   QUEtiapine 25 MG tablet Commonly known as: SEROQUEL Take 0.5 tablets (12.5 mg total) by mouth 2 (two) times daily.   rosuvastatin 40 MG tablet Commonly known as: CRESTOR Take 40 mg by mouth daily.   senna-docusate 8.6-50 MG tablet Commonly known as:  Senokot-S Take 1 tablet by mouth 2 (two) times daily.   tamsulosin 0.4 MG Caps capsule Commonly known as: FLOMAX Take 0.4 mg by mouth daily.       Contact information for follow-up providers    [facility]. Schedule an appointment as soon as possible for a visit in 4 week(s).   Specialty: Neurology Contact information: [address] [place] [phone]           Contact information for after-discharge care    Destination    [facility] .   Service: Skilled Nursing Contact information: [address] [place] [phone]                 No Known Allergies  Consultations: Neuro  Discharge Exam: BP 116/81 (BP Location: Left Arm)   Pulse 71   Temp (!) 97.4 F (36.3 C) (Oral)   Resp 16   Ht 5\' 9"  (1.753 m)   Wt 61.2 kg   SpO2 96%   BMI 19.94 kg/m  General appearance: alert, cooperative and no distress Head: Normocephalic, without obvious abnormality, atraumatic Eyes: EOMI Lungs: clear to auscultation  bilaterally Heart: regular rate and rhythm and S1, S2 normal Abdomen: normal findings: bowel sounds normal and soft, non-tender Extremities: no edema Skin: mobility and turgor normal Neurologic: LLE/LUE 4/5, otherwise 5/5 in remaining; sensation symmetric and normal  The results of significant diagnostics from this hospitalization (including imaging, microbiology, ancillary and laboratory) are listed below for reference.   Microbiology: No results found for this or any previous visit (from the past 240 hour(s)).   Labs: BNP (last 3 results) No results for input(s): BNP in the last 8760 hours. Basic Metabolic Panel: Recent Labs  Lab 08/09/20 0243 08/10/20 0315 08/11/20 0251 08/12/20 0320 08/13/20 0230  NA 140 139 140 138 138  K 4.0 3.8 3.7 3.8 3.9  CL 110 109 110 108 108  CO2 20* 21* 21* 20* 21*  GLUCOSE 94 125* 112* 103* 98  BUN 22 23  25* 26* 25*  CREATININE 1.74* 1.67* 1.61* 1.64* 1.67*  CALCIUM 8.4* 8.3* 8.1* 8.2* 8.1*  MG 1.9 1.8 1.7 1.9 1.8   Liver Function Tests: No results for input(s): AST, ALT, ALKPHOS, BILITOT, PROT, ALBUMIN in the last 168 hours. No results for input(s): LIPASE, AMYLASE in the last 168 hours. No results for input(s): AMMONIA in the last 168 hours. CBC: Recent Labs  Lab 08/09/20 0243 08/10/20 0315 08/11/20 0251 08/12/20 0320 08/13/20 0230  WBC 6.6 7.4 7.8 7.4 7.3  NEUTROABS 3.4 4.5 4.6 4.3 4.1  HGB 10.8* 10.5* 10.6* 11.0* 10.6*  HCT 33.0* 32.5* 33.7* 34.5* 34.1*  MCV 84.8 84.4 83.8 83.9 83.8  PLT 137* 151 182 213 229   Cardiac Enzymes: No results for input(s): CKTOTAL, CKMB, CKMBINDEX, TROPONINI in the last 168 hours. BNP: Invalid input(s): POCBNP CBG: No results for input(s): GLUCAP in the last 168 hours. D-Dimer No results for input(s): DDIMER in the last 72 hours. Hgb A1c No results for input(s): HGBA1C in the last 72 hours. Lipid Profile No results for input(s): CHOL, HDL, LDLCALC, TRIG, CHOLHDL, LDLDIRECT in the last 72 hours. Thyroid function studies No results for input(s): TSH, T4TOTAL, T3FREE, THYROIDAB in the last 72 hours.  Invalid input(s): FREET3 Anemia work up No results for input(s): VITAMINB12, FOLATE, FERRITIN, TIBC, IRON, RETICCTPCT in the last 72 hours. Urinalysis    Component Value Date/Time   COLORURINE STRAW (A) 08/02/2020 1241   APPEARANCEUR CLEAR 08/02/2020 1241   LABSPEC 1.008 08/02/2020 1241   PHURINE 7.0 08/02/2020 1241   GLUCOSEU NEGATIVE 08/02/2020 1241   HGBUR NEGATIVE 08/02/2020 1241   BILIRUBINUR NEGATIVE 08/02/2020 1241   KETONESUR NEGATIVE 08/02/2020 1241   PROTEINUR NEGATIVE 08/02/2020 1241   UROBILINOGEN 0.2 12/19/2014 1249   NITRITE NEGATIVE 08/02/2020 1241   LEUKOCYTESUR NEGATIVE 08/02/2020 1241   Sepsis Labs Invalid input(s): PROCALCITONIN,  WBC,  LACTICIDVEN Microbiology No results found for this or any previous visit  (from the past 240 hour(s)).  Procedures/Studies: CT HEAD WO CONTRAST  Addendum Date: 08/02/2020   ADDENDUM REPORT: 08/02/2020 15:56 ADDENDUM: On further review, decreased attenuation is noted in the medial right temporal-parietal junction region concerning for acute infarct in this area. Electronically Signed   By: Lowella Grip III M.D.   On: 08/02/2020 15:56   Result Date: 08/02/2020 CLINICAL DATA:  Global amnesia with altered mental status EXAM: CT HEAD WITHOUT CONTRAST TECHNIQUE: Contiguous axial images were obtained from the base of the skull through the vertex without intravenous contrast. COMPARISON:  Brain MRI June 27, 2019 FINDINGS: Brain: There is age related volume loss. There is no intracranial mass, hemorrhage, extra-axial  fluid collection, or midline shift. There is mild small vessel disease in the centra semiovale bilaterally. No acute infarct appreciable. Vascular: No hyperdense vessel. Foci of calcification in the carotid siphon regions noted. Skull: The bony calvarium appears intact. Sinuses/Orbits: There is mucosal thickening in the inferior left maxillary antrum anteriorly as well as mucosal thickening in several ethmoid air cells. Orbits appear symmetric bilaterally. Other: Mastoid air cells are clear. IMPRESSION: Age related volume loss with mild stable periventricular small vessel disease. No acute infarct. No mass or hemorrhage. There are foci of arterial vascular calcification. There are foci of paranasal sinus disease. Electronically Signed: By: Lowella Grip III M.D. On: 08/02/2020 13:07   MR ANGIO HEAD WO CONTRAST  Result Date: 08/02/2020 CLINICAL DATA:  Stroke EXAM: MRI HEAD WITHOUT CONTRAST MRA HEAD WITHOUT CONTRAST MRA NECK WITHOUT AND WITH CONTRAST TECHNIQUE: Multiplanar, multiecho pulse sequences of the brain and surrounding structures were obtained without intravenous contrast. Angiographic images of the Circle of Willis were obtained using MRA technique  without intravenous contrast. Angiographic images of the neck were obtained using MRA technique without and with intravenous contrast. Carotid stenosis measurements (when applicable) are obtained utilizing NASCET criteria, using the distal internal carotid diameter as the denominator. CONTRAST:  3mL GADAVIST GADOBUTROL 1 MMOL/ML IV SOLN COMPARISON:  Correlation made with same day CT FINDINGS: MRI HEAD Brain: There is restricted diffusion involving the medial right temporal lobe with extension into the occipital lobe. Additional small foci of cortical restricted diffusion contralaterally at the left parietooccipital junction, left frontal lobe, and left cerebellum. Small focus of susceptibility in the right cerebellum is most consistent with chronic microhemorrhage. Additional patchy T2 hyperintensity in the supratentorial white matter is nonspecific but probably reflects mild chronic microvascular ischemic changes. Prominence of the ventricles and sulci reflects generalized parenchymal volume loss. There is no intracranial mass or significant mass effect. There is no hydrocephalus or extra-axial fluid collection. Vascular: Susceptibility along the right perimesencephalic cistern likely reflects thrombosis within the right P2 PCA. Major vessel flow voids at the skull base are preserved. Skull and upper cervical spine: Normal marrow signal is preserved. Sinuses/Orbits: Mild mucosal thickening. No significant orbital abnormality. Other: Sella is unremarkable. Mild patchy mastoid fluid opacification. MRA HEAD Intracranial internal carotid arteries are patent. Middle and anterior cerebral arteries are patent. Atherosclerotic irregularity of distal left MCA branches. Included intracranial vertebral arteries are patent. Patent PICA origins. Basilar artery is patent. There is diminished flow within the right P1 PCA. Possible diminutive right posterior communicating artery. Occlusion of the right P2 PCA. Left posterior  cerebral artery is patent. MRA NECK Common, internal, and external carotid arteries are patent. There is plaque at the ICA origins bilaterally causing minimal stenosis. Extracranial vertebral arteries are patent. Left vertebral artery is slightly dominant. No measurable stenosis or evidence of dissection. IMPRESSION: Moderate size right PCA territory acute infarction involving temporo-occipital lobes. Occlusion of the right P2 PCA. Small acute cortical infarcts of the left frontal lobe and left parieto-occipital junction. Atherosclerotic irregularity of distal left MCA branches. Atherosclerotic plaque at the ICA origins with minimal stenosis. Electronically Signed   By: Macy Mis M.D.   On: 08/02/2020 15:53   MR ANGIO NECK W WO CONTRAST  Result Date: 08/02/2020 CLINICAL DATA:  Stroke EXAM: MRI HEAD WITHOUT CONTRAST MRA HEAD WITHOUT CONTRAST MRA NECK WITHOUT AND WITH CONTRAST TECHNIQUE: Multiplanar, multiecho pulse sequences of the brain and surrounding structures were obtained without intravenous contrast. Angiographic images of the Circle of Willis were obtained using MRA  technique without intravenous contrast. Angiographic images of the neck were obtained using MRA technique without and with intravenous contrast. Carotid stenosis measurements (when applicable) are obtained utilizing NASCET criteria, using the distal internal carotid diameter as the denominator. CONTRAST:  65mL GADAVIST GADOBUTROL 1 MMOL/ML IV SOLN COMPARISON:  Correlation made with same day CT FINDINGS: MRI HEAD Brain: There is restricted diffusion involving the medial right temporal lobe with extension into the occipital lobe. Additional small foci of cortical restricted diffusion contralaterally at the left parietooccipital junction, left frontal lobe, and left cerebellum. Small focus of susceptibility in the right cerebellum is most consistent with chronic microhemorrhage. Additional patchy T2 hyperintensity in the supratentorial white  matter is nonspecific but probably reflects mild chronic microvascular ischemic changes. Prominence of the ventricles and sulci reflects generalized parenchymal volume loss. There is no intracranial mass or significant mass effect. There is no hydrocephalus or extra-axial fluid collection. Vascular: Susceptibility along the right perimesencephalic cistern likely reflects thrombosis within the right P2 PCA. Major vessel flow voids at the skull base are preserved. Skull and upper cervical spine: Normal marrow signal is preserved. Sinuses/Orbits: Mild mucosal thickening. No significant orbital abnormality. Other: Sella is unremarkable. Mild patchy mastoid fluid opacification. MRA HEAD Intracranial internal carotid arteries are patent. Middle and anterior cerebral arteries are patent. Atherosclerotic irregularity of distal left MCA branches. Included intracranial vertebral arteries are patent. Patent PICA origins. Basilar artery is patent. There is diminished flow within the right P1 PCA. Possible diminutive right posterior communicating artery. Occlusion of the right P2 PCA. Left posterior cerebral artery is patent. MRA NECK Common, internal, and external carotid arteries are patent. There is plaque at the ICA origins bilaterally causing minimal stenosis. Extracranial vertebral arteries are patent. Left vertebral artery is slightly dominant. No measurable stenosis or evidence of dissection. IMPRESSION: Moderate size right PCA territory acute infarction involving temporo-occipital lobes. Occlusion of the right P2 PCA. Small acute cortical infarcts of the left frontal lobe and left parieto-occipital junction. Atherosclerotic irregularity of distal left MCA branches. Atherosclerotic plaque at the ICA origins with minimal stenosis. Electronically Signed   By: Macy Mis M.D.   On: 08/02/2020 15:53   MR BRAIN WO CONTRAST  Result Date: 08/02/2020 CLINICAL DATA:  Stroke EXAM: MRI HEAD WITHOUT CONTRAST MRA HEAD  WITHOUT CONTRAST MRA NECK WITHOUT AND WITH CONTRAST TECHNIQUE: Multiplanar, multiecho pulse sequences of the brain and surrounding structures were obtained without intravenous contrast. Angiographic images of the Circle of Willis were obtained using MRA technique without intravenous contrast. Angiographic images of the neck were obtained using MRA technique without and with intravenous contrast. Carotid stenosis measurements (when applicable) are obtained utilizing NASCET criteria, using the distal internal carotid diameter as the denominator. CONTRAST:  44mL GADAVIST GADOBUTROL 1 MMOL/ML IV SOLN COMPARISON:  Correlation made with same day CT FINDINGS: MRI HEAD Brain: There is restricted diffusion involving the medial right temporal lobe with extension into the occipital lobe. Additional small foci of cortical restricted diffusion contralaterally at the left parietooccipital junction, left frontal lobe, and left cerebellum. Small focus of susceptibility in the right cerebellum is most consistent with chronic microhemorrhage. Additional patchy T2 hyperintensity in the supratentorial white matter is nonspecific but probably reflects mild chronic microvascular ischemic changes. Prominence of the ventricles and sulci reflects generalized parenchymal volume loss. There is no intracranial mass or significant mass effect. There is no hydrocephalus or extra-axial fluid collection. Vascular: Susceptibility along the right perimesencephalic cistern likely reflects thrombosis within the right P2 PCA. Major vessel flow voids  at the skull base are preserved. Skull and upper cervical spine: Normal marrow signal is preserved. Sinuses/Orbits: Mild mucosal thickening. No significant orbital abnormality. Other: Sella is unremarkable. Mild patchy mastoid fluid opacification. MRA HEAD Intracranial internal carotid arteries are patent. Middle and anterior cerebral arteries are patent. Atherosclerotic irregularity of distal left MCA  branches. Included intracranial vertebral arteries are patent. Patent PICA origins. Basilar artery is patent. There is diminished flow within the right P1 PCA. Possible diminutive right posterior communicating artery. Occlusion of the right P2 PCA. Left posterior cerebral artery is patent. MRA NECK Common, internal, and external carotid arteries are patent. There is plaque at the ICA origins bilaterally causing minimal stenosis. Extracranial vertebral arteries are patent. Left vertebral artery is slightly dominant. No measurable stenosis or evidence of dissection. IMPRESSION: Moderate size right PCA territory acute infarction involving temporo-occipital lobes. Occlusion of the right P2 PCA. Small acute cortical infarcts of the left frontal lobe and left parieto-occipital junction. Atherosclerotic irregularity of distal left MCA branches. Atherosclerotic plaque at the ICA origins with minimal stenosis. Electronically Signed   By: Guadlupe Spanish M.D.   On: 08/02/2020 15:53   US RENAL  Result Date: 08/05/2020 CLINICAL DATA:  Acute renal insufficiency EXAM: RENAL / URINARY TRACT ULTRASOUND COMPLETE COMPARISON:  None. FINDINGS: Right Kidney: Renal measurements: 10.2 x 4.7 x 4.8 cm = volume: 119.7 mL. Increased cortical echogenicity. No suspicious mass, stone, or hydronephrosis. Left Kidney: Renal measurements: 11.1 x 5.9 x 6.1 cm = volume: 210.4 mL. Increased cortical echogenicity. No suspicious mass, stone, or hydronephrosis. Bladder: Appears normal for degree of bladder distention. Other: None. IMPRESSION: 1. Increased cortical echogenicity bilaterally consistent with medical renal disease. No other abnormalities. Electronically Signed   By: Gerome Sam III M.D   On: 08/05/2020 16:34   ECHOCARDIOGRAM COMPLETE  Result Date: 08/03/2020    ECHOCARDIOGRAM REPORT   Patient Name:   MARIA GALLICCHIO Date of Exam: 08/03/2020 Medical Rec #:  [id number]        Height:       69.0 in Accession #:    [id number]        Weight:       135.0 lb Date of Birth:  07-30-1930        BSA:          1.748 m Patient Age:    89 years         BP:           169/96 mmHg Patient Gender: M                HR:           94 bpm. Exam Location:  Inpatient Procedure: 2D Echo, Cardiac Doppler and Color Doppler Indications:    TIA 435.9 / G45.9  History:        Patient has prior history of Echocardiogram examinations, most                 recent 05/02/2020. Arrythmias:Atrial Fibrillation,                 Signs/Symptoms:Dyspnea; Risk Factors:Hypertension and                 Dyslipidemia. Abdominal Aortic Aneurysm. CKD. Pericardial                 effusion.  Sonographer:    Elmarie Shiley Dance Referring Phys: [id number] Emeline General IMPRESSIONS  1. Left ventricular ejection fraction, by estimation, is >75%. The left ventricle  has hyperdynamic function. The left ventricle has no regional wall motion abnormalities. There is mild left ventricular hypertrophy. Left ventricular diastolic function could not be evaluated.  2. Right ventricular systolic function is normal. The right ventricular size is normal. Tricuspid regurgitation signal is inadequate for assessing PA pressure.  3. Left atrial size was severely dilated.  4. Right atrial size was mildly dilated.  5. The mitral valve is normal in structure. Mild mitral valve regurgitation. No evidence of mitral stenosis.  6. The aortic valve is calcified. Aortic valve regurgitation is mild. Mild to moderate aortic valve sclerosis/calcification is present, without any evidence of aortic stenosis.  7. The inferior vena cava is normal in size with greater than 50% respiratory variability, suggesting right atrial pressure of 3 mmHg. FINDINGS  Left Ventricle: Left ventricular ejection fraction, by estimation, is >75%. The left ventricle has hyperdynamic function. The left ventricle has no regional wall motion abnormalities. The left ventricular internal cavity size was normal in size. There is mild left ventricular hypertrophy.  Left ventricular diastolic function could not be evaluated due to atrial fibrillation. Left ventricular diastolic function could not be evaluated. Right Ventricle: The right ventricular size is normal.Right ventricular systolic function is normal. Tricuspid regurgitation signal is inadequate for assessing PA pressure. The tricuspid regurgitant velocity is 2.38 m/s, and with an assumed right atrial pressure of 3 mmHg, the estimated right ventricular systolic pressure is 25.7 mmHg. Left Atrium: Left atrial size was severely dilated. Right Atrium: Right atrial size was mildly dilated. Pericardium: Trivial pericardial effusion is present. Mitral Valve: The mitral valve is normal in structure. Mild mitral annular calcification. Mild mitral valve regurgitation. No evidence of mitral valve stenosis. Tricuspid Valve: The tricuspid valve is normal in structure. Tricuspid valve regurgitation is mild . No evidence of tricuspid stenosis. Aortic Valve: The aortic valve is calcified. Aortic valve regurgitation is mild. Aortic regurgitation PHT measures 715 msec. Mild to moderate aortic valve sclerosis/calcification is present, without any evidence of aortic stenosis. Aortic valve mean gradient measures 7.5 mmHg. Aortic valve peak gradient measures 15.6 mmHg. Aortic valve area, by VTI measures 1.98 cm. Pulmonic Valve: The pulmonic valve was not well visualized. Pulmonic valve regurgitation is not visualized. No evidence of pulmonic stenosis. Aorta: The aortic root is normal in size and structure. Venous: The inferior vena cava is normal in size with greater than 50% respiratory variability, suggesting right atrial pressure of 3 mmHg.  LEFT VENTRICLE PLAX 2D LVIDd:         3.45 cm LVIDs:         2.85 cm LV PW:         1.75 cm LV IVS:        1.20 cm LVOT diam:     2.10 cm LV SV:         70 LV SV Index:   40 LVOT Area:     3.46 cm  RIGHT VENTRICLE          IVC RV Basal diam:  3.47 cm  IVC diam: 1.60 cm RV Mid diam:    2.53 cm TAPSE  (M-mode): 1.5 cm LEFT ATRIUM              Index       RIGHT ATRIUM           Index LA diam:        4.80 cm  2.75 cm/m  RA Area:     23.00 cm LA Vol (A2C):   105.0 ml  60.06 ml/m RA Volume:   62.70 ml  35.87 ml/m LA Vol (A4C):   75.4 ml  43.13 ml/m LA Biplane Vol: 92.8 ml  53.08 ml/m  AORTIC VALVE AV Area (Vmax):    2.11 cm AV Area (Vmean):   2.24 cm AV Area (VTI):     1.98 cm AV Vmax:           197.50 cm/s AV Vmean:          125.000 cm/s AV VTI:            0.355 m AV Peak Grad:      15.6 mmHg AV Mean Grad:      7.5 mmHg LVOT Vmax:         120.50 cm/s LVOT Vmean:        80.750 cm/s LVOT VTI:          0.203 m LVOT/AV VTI ratio: 0.57 AI PHT:            715 msec  AORTA Ao Root diam: 3.80 cm Ao Asc diam:  3.60 cm MITRAL VALVE                TRICUSPID VALVE MV Area (PHT): 3.99 cm     TR Peak grad:   22.7 mmHg MV Decel Time: 190 msec     TR Vmax:        238.00 cm/s MV E velocity: 155.00 cm/s MV A velocity: 56.20 cm/s   SHUNTS MV E/A ratio:  2.76         Systemic VTI:  0.20 m                             Systemic Diam: 2.10 cm Kirk Ruths MD Electronically signed by Kirk Ruths MD Signature Date/Time: 08/03/2020/12:27:01 PM    Final      Time coordinating discharge: Over 30 minutes    Chris Dee, MD  [facility] 08/14/2020, 1:25 PM

## 2020-08-14 NOTE — TOC Transition Note (Signed)
Transition of Care [facility]) - CM/SW Discharge Note   Patient Details  Name: Chris Peterson MRN: [id number] Date of Birth: [birth year]  Transition of [facility]) CM/SW Contact:  Benard Halsted, LCSW Phone Number: 08/14/2020, 1:35 PM   Clinical Narrative:    Patient will DC to: [facility] Anticipated DC date: 08/14/20 Family notified: Daughter, Remo Lipps Transport by: PTAR 2:30pm   Per MD patient ready for DC to [facility]. RN to call report prior to discharge [phone]). RN, patient, patient's family, and facility notified of DC. Discharge Summary and FL2 sent to facility. DC packet on chart. Ambulance transport requested for patient.   CSW will sign off for now as social work intervention is no longer needed. Please consult [place] again if new needs arise.      Final next level of care: Skilled Nursing Facility Barriers to Discharge: Barriers Resolved   Patient Goals and CMS Choice Patient states their goals for this hospitalization and ongoing recovery are:: Rehab CMS Medicare.gov Compare Post Acute Care list provided to:: Patient Represenative (must comment) Choice offered to / list presented to : Adult Children,Patient  Discharge Placement   Existing PASRR number confirmed : 08/14/20          Patient chooses bed at: [facility] Patient to be transferred to facility by: [place] Name of family member notified: Daughter Patient and family notified of of transfer: 08/14/20  Discharge Plan and Services In-house Referral: Clinical Social Work Discharge Planning Services: CM Consult Post Acute Care Choice: [facility]                               Social Determinants of Health (SDOH) Interventions     Readmission Risk Interventions No flowsheet data found.

## 2020-08-14 NOTE — Plan of Care (Signed)
  Problem: Ischemic Stroke/TIA Tissue Perfusion: Goal: Complications of ischemic stroke/TIA will be minimized Outcome: Progressing   Problem: Safety: Goal: Ability to remain free from injury will improve Outcome: Progressing   Problem: Skin Integrity: Goal: Risk for impaired skin integrity will decrease Outcome: Progressing   Problem: Education: Goal: Knowledge of General Education information will improve Description: Including pain rating scale, medication(s)/side effects and non-pharmacologic comfort measures Outcome: Progressing   Problem: Ischemic Stroke/TIA Tissue Perfusion: Goal: Complications of ischemic stroke/TIA will be minimized Outcome: Progressing

## 2020-08-14 NOTE — Progress Notes (Signed)
Occupational Therapy Treatment Patient Details Name: Chris Peterson MRN: [id number] DOB: [birth year] Today's Date: 08/14/2020    History of present illness Pt is an 86 y.o. male with a hx of sciatica, pre-diabetes, pericardial effusion, bilat atherosclerotic renal artery stenosis, paroxysmal a-fib, HTN, CKD stage 3B, AAA, GI bleed, HLD, and chronic anemia who presented with partial vision loss in both eyes, lightheadedness, slurred speech, slight L-sided weakness, and confusion. CT was negative, but MRI revealed moderate R side PCA acute CVA involving temporo-occipital lobes along with small acute cortical infarcts of L frontal lobe and L parieto-occipital junction and MRA showed occlusion of R P2 PCA. NIHSS = 2. No tPA administered.   OT comments  Pt making steady progress towards OT goals this session. Pt continues to present with decreased activity tolerance, generalized weakness, and increased pain in pts LUE. Pt able to progress OOB for standing grooming tasks at sink and household distance functional mobility with RW and min guard assist. Pt additionally able to complete light therex as indicated below to facilitate increased strength and AROM for higher level BADLs. Pt reports more fatigue this session declining sitting up in chair at end of session. Noted pt was denied CIR and family requesting SNF, updated DC recs to SNF, will let OTR know about change in POC. Will continue to follow acutely per POC.    Follow Up Recommendations  SNF    Equipment Recommendations  3 in 1 bedside commode    Recommendations for Other Services      Precautions / Restrictions Precautions Precautions: Fall Restrictions Weight Bearing Restrictions: No       Mobility Bed Mobility Overal bed mobility: Needs Assistance Bed Mobility: Supine to Sit     Supine to sit: Min guard     General bed mobility comments: min guard for safety  Transfers Overall transfer level: Needs assistance Equipment  used: Rolling walker (2 wheeled) Transfers: Sit to/from Stand Sit to Stand: Min guard         General transfer comment: min guard for safety    Balance Overall balance assessment: Needs assistance Sitting-balance support: Feet supported;Single extremity supported Sitting balance-Leahy Scale: Good     Standing balance support: Single extremity supported;During functional activity Standing balance-Leahy Scale: Poor Standing balance comment: required at least one UE supported                           ADL either performed or assessed with clinical judgement   ADL Overall ADL's : Needs assistance/impaired     Grooming: Oral care;Standing;Min guard Grooming Details (indicate cue type and reason): min guard for balance with RW at sink; no issues opening items or brushing dentures                 Toilet Transfer: Min guard;Ambulation;RW Toilet Transfer Details (indicate cue type and reason): simulated via functional mobility with RW and min guard assist         Functional mobility during ADLs: Minimal assistance;Rolling walker General ADL Comments: pt making good progress towards OT goals this session however pt reports increased fatigue this session requesting to return back to bed at end of session     Vision       Perception     Praxis      Cognition Arousal/Alertness: Awake/alert Behavior During Therapy: [facility] for tasks assessed/performed;Flat affect Overall Cognitive Status: Impaired/Different from baseline  Problem Solving: Slow processing;Requires verbal cues;Decreased initiation General Comments: pt more flat this session and more slow to process needing cues to initiate task        Exercises General Exercises - Upper Extremity Shoulder Flexion: AROM;5 reps;Supine;Left Elbow Flexion: AROM;Left;5 reps;Supine Elbow Extension: AROM;Left;5 reps;Supine General Exercises - Lower Extremity Long Arc Quad:  AROM;Both;15 reps;Seated Hip Flexion/Marching: AROM;Both;15 reps;Seated   Shoulder Instructions       General Comments      Pertinent Vitals/ Pain       Pain Assessment: Faces Faces Pain Scale: Hurts even more Pain Location: L shoulder Pain Descriptors / Indicators: Aching;Constant;Discomfort Pain Intervention(s): Limited activity within patient's tolerance;Monitored during session;Repositioned  Home Living                                          Prior Functioning/Environment              Frequency  Min 2X/week        Progress Toward Goals  OT Goals(current goals can now be found in the care plan section)  Progress towards OT goals: Progressing toward goals  Acute Rehab OT Goals Patient Stated Goal: to go home OT Goal Formulation: With patient/family Time For Goal Achievement: 08/18/20 Potential to Achieve Goals: Good  Plan Discharge plan needs to be updated;Frequency needs to be updated    Co-evaluation                 AM-PAC OT "6 Clicks" Daily Activity     Outcome Measure   Help from another person eating meals?: None Help from another person taking care of personal grooming?: A Little Help from another person toileting, which includes using toliet, bedpan, or urinal?: A Little Help from another person bathing (including washing, rinsing, drying)?: A Little Help from another person to put on and taking off regular upper body clothing?: A Little Help from another person to put on and taking off regular lower body clothing?: A Little 6 Click Score: 19    End of Session Equipment Utilized During Treatment: Rolling walker;Gait belt  OT Visit Diagnosis: Unsteadiness on feet (R26.81);Other abnormalities of gait and mobility (R26.89);Muscle weakness (generalized) (M62.81);Other symptoms and signs involving cognitive function;Low vision, both eyes (H54.2)   Activity Tolerance Patient tolerated treatment well   Patient Left in  bed;with call bell/phone within reach;with bed alarm set   Nurse Communication Mobility status;Other (comment) (increased pain in pts L shoulder; more flat this session in comparison to previous sessions)        Time: [id number] OT Time Calculation (min): 23 min  Charges: OT General Charges $OT Visit: 1 Visit OT Treatments $Self Care/Home Management : 8-22 mins $Therapeutic Activity: 8-22 mins  Chris Alto., COTA/L Acute Rehabilitation Services [phone]    Chris Peterson 08/14/2020, 2:24 PM

## 2020-08-15 ENCOUNTER — Emergency Department (HOSPITAL_COMMUNITY): Payer: MEDICARE

## 2020-08-15 ENCOUNTER — Encounter (HOSPITAL_COMMUNITY): Payer: Self-pay

## 2020-08-15 ENCOUNTER — Other Ambulatory Visit (HOSPITAL_COMMUNITY): Payer: Self-pay

## 2020-08-15 ENCOUNTER — Inpatient Hospital Stay (HOSPITAL_COMMUNITY)
Admission: EM | Admit: 2020-08-15 | Discharge: 2020-09-19 | DRG: 951 | Disposition: E | Payer: MEDICARE | Source: Skilled Nursing Facility | Attending: Internal Medicine | Admitting: Internal Medicine

## 2020-08-15 ENCOUNTER — Other Ambulatory Visit: Payer: Self-pay

## 2020-08-15 DIAGNOSIS — R58 Hemorrhage, not elsewhere classified: Secondary | ICD-10-CM | POA: Diagnosis present

## 2020-08-15 DIAGNOSIS — J189 Pneumonia, unspecified organism: Secondary | ICD-10-CM | POA: Diagnosis not present

## 2020-08-15 DIAGNOSIS — M25512 Pain in left shoulder: Secondary | ICD-10-CM

## 2020-08-15 DIAGNOSIS — I639 Cerebral infarction, unspecified: Secondary | ICD-10-CM | POA: Diagnosis present

## 2020-08-15 DIAGNOSIS — E785 Hyperlipidemia, unspecified: Secondary | ICD-10-CM | POA: Diagnosis present

## 2020-08-15 DIAGNOSIS — K253 Acute gastric ulcer without hemorrhage or perforation: Secondary | ICD-10-CM | POA: Diagnosis present

## 2020-08-15 DIAGNOSIS — F0390 Unspecified dementia without behavioral disturbance: Secondary | ICD-10-CM | POA: Diagnosis present

## 2020-08-15 DIAGNOSIS — I714 Abdominal aortic aneurysm, without rupture: Secondary | ICD-10-CM | POA: Diagnosis present

## 2020-08-15 DIAGNOSIS — R220 Localized swelling, mass and lump, head: Secondary | ICD-10-CM | POA: Diagnosis not present

## 2020-08-15 DIAGNOSIS — I313 Pericardial effusion (noninflammatory): Secondary | ICD-10-CM | POA: Diagnosis present

## 2020-08-15 DIAGNOSIS — K649 Unspecified hemorrhoids: Secondary | ICD-10-CM | POA: Diagnosis present

## 2020-08-15 DIAGNOSIS — Z8249 Family history of ischemic heart disease and other diseases of the circulatory system: Secondary | ICD-10-CM

## 2020-08-15 DIAGNOSIS — Z20822 Contact with and (suspected) exposure to covid-19: Secondary | ICD-10-CM | POA: Diagnosis present

## 2020-08-15 DIAGNOSIS — Z515 Encounter for palliative care: Secondary | ICD-10-CM | POA: Diagnosis not present

## 2020-08-15 DIAGNOSIS — Z66 Do not resuscitate: Secondary | ICD-10-CM | POA: Diagnosis present

## 2020-08-15 DIAGNOSIS — R54 Age-related physical debility: Secondary | ICD-10-CM | POA: Diagnosis present

## 2020-08-15 DIAGNOSIS — I700 Atherosclerosis of aorta: Secondary | ICD-10-CM | POA: Diagnosis not present

## 2020-08-15 DIAGNOSIS — F05 Delirium due to known physiological condition: Secondary | ICD-10-CM | POA: Diagnosis present

## 2020-08-15 DIAGNOSIS — I10 Essential (primary) hypertension: Secondary | ICD-10-CM | POA: Diagnosis present

## 2020-08-15 DIAGNOSIS — E1122 Type 2 diabetes mellitus with diabetic chronic kidney disease: Secondary | ICD-10-CM | POA: Diagnosis present

## 2020-08-15 DIAGNOSIS — I63531 Cerebral infarction due to unspecified occlusion or stenosis of right posterior cerebral artery: Secondary | ICD-10-CM | POA: Diagnosis present

## 2020-08-15 DIAGNOSIS — J90 Pleural effusion, not elsewhere classified: Secondary | ICD-10-CM | POA: Diagnosis not present

## 2020-08-15 DIAGNOSIS — I672 Cerebral atherosclerosis: Secondary | ICD-10-CM | POA: Diagnosis not present

## 2020-08-15 DIAGNOSIS — N179 Acute kidney failure, unspecified: Secondary | ICD-10-CM | POA: Diagnosis present

## 2020-08-15 DIAGNOSIS — I5032 Chronic diastolic (congestive) heart failure: Secondary | ICD-10-CM | POA: Diagnosis present

## 2020-08-15 DIAGNOSIS — I701 Atherosclerosis of renal artery: Secondary | ICD-10-CM | POA: Diagnosis present

## 2020-08-15 DIAGNOSIS — M5430 Sciatica, unspecified side: Secondary | ICD-10-CM | POA: Diagnosis present

## 2020-08-15 DIAGNOSIS — S81811A Laceration without foreign body, right lower leg, initial encounter: Secondary | ICD-10-CM | POA: Diagnosis present

## 2020-08-15 DIAGNOSIS — M19012 Primary osteoarthritis, left shoulder: Secondary | ICD-10-CM | POA: Diagnosis not present

## 2020-08-15 DIAGNOSIS — I130 Hypertensive heart and chronic kidney disease with heart failure and stage 1 through stage 4 chronic kidney disease, or unspecified chronic kidney disease: Secondary | ICD-10-CM | POA: Diagnosis present

## 2020-08-15 DIAGNOSIS — Z8673 Personal history of transient ischemic attack (TIA), and cerebral infarction without residual deficits: Secondary | ICD-10-CM

## 2020-08-15 DIAGNOSIS — I679 Cerebrovascular disease, unspecified: Secondary | ICD-10-CM | POA: Diagnosis not present

## 2020-08-15 DIAGNOSIS — Z79899 Other long term (current) drug therapy: Secondary | ICD-10-CM

## 2020-08-15 DIAGNOSIS — Z95828 Presence of other vascular implants and grafts: Secondary | ICD-10-CM

## 2020-08-15 DIAGNOSIS — E119 Type 2 diabetes mellitus without complications: Secondary | ICD-10-CM

## 2020-08-15 DIAGNOSIS — Z7901 Long term (current) use of anticoagulants: Secondary | ICD-10-CM

## 2020-08-15 DIAGNOSIS — N1832 Chronic kidney disease, stage 3b: Secondary | ICD-10-CM | POA: Diagnosis present

## 2020-08-15 DIAGNOSIS — I480 Paroxysmal atrial fibrillation: Secondary | ICD-10-CM | POA: Diagnosis present

## 2020-08-15 DIAGNOSIS — F17290 Nicotine dependence, other tobacco product, uncomplicated: Secondary | ICD-10-CM | POA: Diagnosis present

## 2020-08-15 DIAGNOSIS — T68XXXA Hypothermia, initial encounter: Secondary | ICD-10-CM | POA: Diagnosis present

## 2020-08-15 NOTE — Progress Notes (Signed)
[facility]  D/C'd Nursing Home per MD order.  Discussed with the patient and family, all questions fully answered. Report called to the number provided by social worker but no response from nursing home.  This nurse will call back to give report to the nurse who will be accepting patient.  VSS, Skin clean, dry and intact without evidence of skin break down, no evidence of skin tears noted. IV catheter discontinued intact. Site without signs and symptoms of complications. Dressing and pressure applied.  An After Visit Summary was printed and given to [facility]  D/c education completed with patient/family including follow up instructions, medication list, d/c activities limitations if indicated, with other d/c instructions as indicated by MD - patient and family able to verbalize understanding, all questions fully answered.   Patient instructed to return to ED, call 911, or call MD for any changes in condition.   Patient escorted via stretcher , and D/C to nursing home via  ambulance .  Melvenia Needles 09/08/20 8:04 AM

## 2020-08-15 NOTE — Telephone Encounter (Signed)
Spoke with the patient's daughters who state that the patient was discharged from the hospital yesterday and he should not have been. He was discharged to a skilled nursing facility. The daughter's state that the patient has continued to decline. He is weak, dizzy, confused, orthostatic and not eating. He continues to go in and out of A Fib. The skilled nursing facility has arranged for transport for the patient to go back to the ER. Daughters state that they were told that they could skip the ER If they could get a provider to directly admit the patient. I advised them that we do not typically put in orders for direct admissions unless the patient is currently in our office.

## 2020-08-15 NOTE — ED Triage Notes (Signed)
Pt BIB by [facility] from [facility]. Pt recently had a stroke and dc from our facility. Pt c/o sudden onset of Left shoulder pain. Denies any injury. Family also reports decrease mental status since being admitted to [facility]    VS

## 2020-08-15 NOTE — Telephone Encounter (Signed)
Patient's daughter, Raenette Rover, is calling in with patient's other daughter, Lurena Joiner, on the phone. She is requesting to speak with Dr. Ladona Ridgel specifically if Dr. Excell Seltzer is not available. She states the patient recently had 2 major strokes and several minor strokes. She states he was prescribed Eliquis in the hospital and released to [facility] on 08/14/20. However, she is concerned that patient was discharged too soon due to his condition. She states he is declining rapidly and he has been in and out of afib for several months. She assumes the patient may need to be evaluated by an EP cardiologist due patient having been in afib for so long. Please return call to discuss further.  Patient c/o Palpitations:  High priority if patient c/o lightheadedness, shortness of breath, or chest pain  1) How long have you had palpitations/irregular HR/ Afib? Are you having the symptoms now? Afib- patient's daughter is not currently with the patient  2) Are you currently experiencing lightheadedness, SOB or CP? Patient's daughter states she is not currently with the patient  3) Do you have a history of afib (atrial fibrillation) or irregular heart rhythm? yes  4) Have you checked your BP or HR? (document readings if available): no readings available  5) Are you experiencing any other symptoms? Chest pain, dizziness, disorientation, weakness   Pt c/o of Chest Pain: STAT if CP now or developed within 24 hours  1. Are you having CP right now? Patient's daughter states she is not with the patient  2. Are you experiencing any other symptoms (ex. SOB, nausea, vomiting, sweating)? Dizziness, disorientation, weakness   3. How long have you been experiencing CP? Several months, per patient's daughter  4. Is your CP continuous or coming and going? Coming and going   5. Have you taken Nitroglycerin? No ?  STAT if patient feels like he/she is going to faint   1) Are you dizzy now? Patient's  daughter states she is not with the patient   2) Do you feel faint or have you passed out? N/A  3) Do you have any other symptoms? No  4) Have you checked your HR and BP (record if available)? No readings available.

## 2020-08-16 ENCOUNTER — Emergency Department (HOSPITAL_COMMUNITY): Payer: MEDICARE

## 2020-08-16 DIAGNOSIS — M25512 Pain in left shoulder: Secondary | ICD-10-CM | POA: Diagnosis not present

## 2020-08-16 DIAGNOSIS — F05 Delirium due to known physiological condition: Secondary | ICD-10-CM | POA: Diagnosis not present

## 2020-08-16 DIAGNOSIS — J90 Pleural effusion, not elsewhere classified: Secondary | ICD-10-CM | POA: Diagnosis not present

## 2020-08-16 DIAGNOSIS — I639 Cerebral infarction, unspecified: Secondary | ICD-10-CM | POA: Diagnosis not present

## 2020-08-16 DIAGNOSIS — I672 Cerebral atherosclerosis: Secondary | ICD-10-CM | POA: Diagnosis not present

## 2020-08-16 DIAGNOSIS — I700 Atherosclerosis of aorta: Secondary | ICD-10-CM | POA: Diagnosis not present

## 2020-08-16 DIAGNOSIS — I6930 Unspecified sequelae of cerebral infarction: Secondary | ICD-10-CM | POA: Diagnosis not present

## 2020-08-16 DIAGNOSIS — J189 Pneumonia, unspecified organism: Secondary | ICD-10-CM | POA: Diagnosis not present

## 2020-08-16 DIAGNOSIS — R220 Localized swelling, mass and lump, head: Secondary | ICD-10-CM | POA: Diagnosis not present

## 2020-08-16 LAB — URINALYSIS, ROUTINE W REFLEX MICROSCOPIC
Bacteria, UA: NONE SEEN
Bilirubin Urine: NEGATIVE
Glucose, UA: NEGATIVE mg/dL
Ketones, ur: NEGATIVE mg/dL
Leukocytes,Ua: NEGATIVE
Nitrite: NEGATIVE
Protein, ur: 30 mg/dL — AB
Specific Gravity, Urine: 1.015 (ref 1.005–1.030)
pH: 5 (ref 5.0–8.0)

## 2020-08-16 LAB — COMPREHENSIVE METABOLIC PANEL
ALT: 27 U/L (ref 0–44)
AST: 33 U/L (ref 15–41)
Albumin: 2.5 g/dL — ABNORMAL LOW (ref 3.5–5.0)
Alkaline Phosphatase: 56 U/L (ref 38–126)
Anion gap: 11 (ref 5–15)
BUN: 26 mg/dL — ABNORMAL HIGH (ref 8–23)
CO2: 22 mmol/L (ref 22–32)
Calcium: 8.8 mg/dL — ABNORMAL LOW (ref 8.9–10.3)
Chloride: 107 mmol/L (ref 98–111)
Creatinine, Ser: 1.69 mg/dL — ABNORMAL HIGH (ref 0.61–1.24)
GFR, Estimated: 39 mL/min — ABNORMAL LOW (ref >60)
Glucose, Bld: 120 mg/dL — ABNORMAL HIGH (ref 70–99)
Potassium: 4.1 mmol/L (ref 3.5–5.1)
Sodium: 140 mmol/L (ref 135–145)
Total Bilirubin: 0.5 mg/dL (ref 0.3–1.2)
Total Protein: 6.2 g/dL — ABNORMAL LOW (ref 6.5–8.1)

## 2020-08-16 LAB — CBC WITH DIFFERENTIAL/PLATELET
Abs Immature Granulocytes: 0 10*3/uL (ref 0.00–0.07)
Basophils Absolute: 0.2 10*3/uL — ABNORMAL HIGH (ref 0.0–0.1)
Basophils Relative: 2 %
Eosinophils Absolute: 0.1 10*3/uL (ref 0.0–0.5)
Eosinophils Relative: 1 %
HCT: 35.1 % — ABNORMAL LOW (ref 39.0–52.0)
Hemoglobin: 11.5 g/dL — ABNORMAL LOW (ref 13.0–17.0)
Lymphocytes Relative: 21 %
Lymphs Abs: 1.8 10*3/uL (ref 0.7–4.0)
MCH: 27.3 pg (ref 26.0–34.0)
MCHC: 32.8 g/dL (ref 30.0–36.0)
MCV: 83.2 fL (ref 80.0–100.0)
Monocytes Absolute: 0.5 10*3/uL (ref 0.1–1.0)
Monocytes Relative: 6 %
Neutro Abs: 6.2 10*3/uL (ref 1.7–7.7)
Neutrophils Relative %: 70 %
Platelets: 262 10*3/uL (ref 150–400)
RBC: 4.22 MIL/uL (ref 4.22–5.81)
RDW: 15.9 % — ABNORMAL HIGH (ref 11.5–15.5)
WBC: 8.8 10*3/uL (ref 4.0–10.5)
nRBC: 0 % (ref 0.0–0.2)
nRBC: 0 /100 WBC

## 2020-08-16 LAB — SARS CORONAVIRUS 2 (TAT 6-24 HRS): SARS Coronavirus 2: NEGATIVE

## 2020-08-16 LAB — TROPONIN I (HIGH SENSITIVITY)
Troponin I (High Sensitivity): 12 ng/L (ref <18)
Troponin I (High Sensitivity): 11 ng/L (ref <18)

## 2020-08-16 MED ORDER — LORAZEPAM 0.5 MG PO TABS
0.5000 mg | ORAL_TABLET | Freq: Four times a day (QID) | ORAL | Status: DC | PRN
  Administered 2020-08-16 – 2020-08-17 (×2): 0.5 mg via ORAL
  Filled 2020-08-16 (×2): qty 1

## 2020-08-16 MED ORDER — AMLODIPINE BESYLATE 5 MG PO TABS
5.0000 mg | ORAL_TABLET | Freq: Every day | ORAL | Status: DC
  Administered 2020-08-16 – 2020-08-17 (×2): 5 mg via ORAL
  Filled 2020-08-16 (×2): qty 1

## 2020-08-16 MED ORDER — CEPHALEXIN 250 MG PO CAPS
500.0000 mg | ORAL_CAPSULE | Freq: Two times a day (BID) | ORAL | Status: DC
  Administered 2020-08-16 – 2020-08-17 (×3): 500 mg via ORAL
  Filled 2020-08-16 (×3): qty 2

## 2020-08-16 MED ORDER — QUETIAPINE FUMARATE 25 MG PO TABS
25.0000 mg | ORAL_TABLET | Freq: Once | ORAL | Status: AC
  Administered 2020-08-16: 10:00:00 25 mg via ORAL
  Filled 2020-08-16 (×2): qty 1

## 2020-08-16 MED ORDER — APIXABAN 2.5 MG PO TABS
2.5000 mg | ORAL_TABLET | Freq: Two times a day (BID) | ORAL | Status: DC
  Administered 2020-08-16 – 2020-08-17 (×3): 2.5 mg via ORAL
  Filled 2020-08-16 (×5): qty 1

## 2020-08-16 MED ORDER — QUETIAPINE 12.5 MG HALF TABLET
12.5000 mg | ORAL_TABLET | Freq: Two times a day (BID) | ORAL | Status: DC
  Filled 2020-08-16: qty 1

## 2020-08-16 MED ORDER — QUETIAPINE FUMARATE 50 MG PO TABS
50.0000 mg | ORAL_TABLET | Freq: Every day | ORAL | Status: DC
  Administered 2020-08-17: 50 mg via ORAL
  Filled 2020-08-16 (×2): qty 1

## 2020-08-16 MED ORDER — TRAZODONE HCL 50 MG PO TABS
50.0000 mg | ORAL_TABLET | Freq: Every day | ORAL | Status: DC
  Administered 2020-08-16: 22:00:00 50 mg via ORAL
  Filled 2020-08-16: qty 1

## 2020-08-16 MED ORDER — CARVEDILOL 12.5 MG PO TABS
12.5000 mg | ORAL_TABLET | Freq: Two times a day (BID) | ORAL | Status: DC
  Administered 2020-08-16 – 2020-08-17 (×3): 12.5 mg via ORAL
  Filled 2020-08-16: qty 4
  Filled 2020-08-16: qty 1
  Filled 2020-08-16: qty 4

## 2020-08-16 MED ORDER — QUETIAPINE 12.5 MG HALF TABLET
12.5000 mg | ORAL_TABLET | Freq: Two times a day (BID) | ORAL | Status: DC
  Administered 2020-08-16 – 2020-08-17 (×2): 12.5 mg via ORAL
  Filled 2020-08-16 (×8): qty 1

## 2020-08-16 MED ORDER — DOXYCYCLINE HYCLATE 100 MG PO TABS
100.0000 mg | ORAL_TABLET | Freq: Two times a day (BID) | ORAL | Status: DC
  Administered 2020-08-16 – 2020-08-17 (×3): 100 mg via ORAL
  Filled 2020-08-16 (×3): qty 1

## 2020-08-16 NOTE — Telephone Encounter (Signed)
Spoke to [place] the patient's daughter about concerns with her dad current ED visit and the wait time to be placed in a room. Daughter concerned about if her dad is discharged and not admitted where they will take him. Advised that the ED social worker can assist with that. Aurea Graff asking if a heart care doctor can come see her dad for his atrial fibrillation. Advised her that the attending doctor would need to place a cardiology consult. Also asking about a neurology doctor. Advised a consult would need to be placed as well.  Aurea Graff verbalized understanding and appreciation for the phone call and guidance.

## 2020-08-16 NOTE — Progress Notes (Signed)
  4:04 CSW received call from Selena Batten with [facility] requesting new PT consult for patient.  EDP updated.   3:57 CSW received call from Pt's daughter Marylu Lund stating that she is currently looking for placement privately for Pt.

## 2020-08-16 NOTE — Consult Note (Addendum)
Neurology Consultation  Reason for Consult: Increasing confusion, waxing and waning mental status, sundowning concerns from family  Referring Physician: Dr. Billy Fischer  CC: "My father is severely cognitively impaired with waxing and waning mental status"  History is obtained from: Chart review, patient's daughter via telephone  HPI: Chris Peterson is a 86 y.o. male with a medical history significant for hypertension, hyperlipidemia, atrial fibrillation, pericardial effusion (drained 40cc in April 2021), CVA (discharged 12/27 to [facility] facility on Eliquis), chronic kidney disease IIIB, AAA, and a GIB 5-6 years ago s/p vessel cauterization, who presented to the ED 12/28 for family concerns of increased confusion and sundowning.   Per Dr. Phoebe Sharps note 12/16, [name] recently suffered a right PCA, left cerebellum, and left MCA territory infarcts that were embolic in nature secondary to known atrial fibrillation without anticoagulation. On 12/17 Dr. Erlinda Hong noted: "Daughter reported that patient overnight had delirium, told he has confusion.  However on exam, patient awake alert oriented x3.  After I left the room, patient had confusion episode trying to get out of bed. However, easily redirectable." Seroquel 12.5 mg BID was added on 12/20 for delirium and Eliquis 2.45m BID was added for stroke prophylaxis. According to [name]'s daughter, his mental status improved with Seroquel and he was able to ambulate with assistance and had a waxing and waning mental status with increased clarity on 12/24 and 12/25.  [facility] and according to family, the facility did not give him any medications because they did not have the medications and would have to order them. The family was displeased with the care he received and decided to bring him back to the ED. His daughter states that being in the ED all night and not having his medications  led to further confusion for her father.   LKW: Prior to this hospitalization, 12/15 through 12/27; mental status changes first noted in Dr. XPhoebe Sharpsprogress note 12/17 tpa given?: No, patient on Eliquis  ROS: A 14 point ROS was performed and is negative except as noted in the HPI.  Past Medical History:  Diagnosis Date  . AAA [place]) [place]   . Atherosclerotic renal artery stenosis, bilateral (HCC)    Bilateral renal angiography 11/27/11, Right 70% prox, Left 90% prox s/p LRA PTA/Stenting - 6.0x146m;  12/12/2011 Renal Duplex: Patent LRA stent & > 60% RRA ost stenosis  . [place]  . Blood transfusion    S/P "lost blood due to hemorrhoids"  . Complication of anesthesia 2013   Severe Diarrhea aftter renal artery  . Dyspnea    with effusion  . Dysrhythmia    "fast one time"  . Hemorrhoids   . High cholesterol   . HTN (hypertension)   . Kidney disease, chronic, stage III (moderate, EGFR 30-59 ml/min) (HCC)    [facility]  . Pericardial effusion    s/p pericardiocentesis 11/2019 // reaccumulated >> s/p window 12/2019 // Echo 9/21: EF 60-65, no RWMA, moderate LVH, normal RVSF, severe BAE, small effusion posterior to the left ventricle (no evidence of tamponade), mild-moderate MR  . Pre-diabetes   . Sciatica    Family History  Problem Relation Age of Onset  . Hypertension Mother   . Hypertension Brother    Social History:   reports that he has been smoking pipe and cigarettes. He has smoked for the past 65.00 years. He has never used smokeless tobacco. He reports  current alcohol use of about 10.0 standard drinks of alcohol per week. He reports that he does not use drugs.  Medications  Current Facility-Administered Medications:  .  amLODipine (NORVASC) tablet 5 mg, 5 mg, Oral, Daily, Billy Fischer, Erin, MD, 5 mg at 08/16/20 1021 .  apixaban (ELIQUIS) tablet 2.5 mg, 2.5 mg, Oral, BID, Gareth Morgan, MD, 2.5 mg at 08/16/20 1202 .   carvedilol (COREG) tablet 12.5 mg, 12.5 mg, Oral, BID, Billy Fischer, Erin, MD, 12.5 mg at 08/16/20 1020 .  cephALEXin (KEFLEX) capsule 500 mg, 500 mg, Oral, Q12H, Billy Fischer, Erin, MD, 500 mg at 08/16/20 1021 .  doxycycline (VIBRA-TABS) tablet 100 mg, 100 mg, Oral, Q12H, Schlossman, Erin, MD, 100 mg at 08/16/20 1021 .  QUEtiapine (SEROQUEL) tablet 12.5 mg, 12.5 mg, Oral, BID, Billy Fischer, Erin, MD  Current Outpatient Medications:  .  amLODipine (NORVASC) 5 MG tablet, Take 1 tablet (5 mg total) by mouth daily., Disp: , Rfl:  .  apixaban (ELIQUIS) 2.5 MG TABS tablet, Take 1 tablet (2.5 mg total) by mouth 2 (two) times daily., Disp: 60 tablet, Rfl:  .  carvedilol (COREG) 12.5 MG tablet, Take 12.5 mg by mouth 2 (two) times daily., Disp: , Rfl:  .  cholecalciferol (VITAMIN D) 25 MCG (1000 UNIT) tablet, Take 1 tablet (1,000 Units total) by mouth daily., Disp: , Rfl:  .  QUEtiapine (SEROQUEL) 25 MG tablet, Take 0.5 tablets (12.5 mg total) by mouth 2 (two) times daily., Disp: , Rfl:  .  rosuvastatin (CRESTOR) 40 MG tablet, Take 40 mg by mouth daily., Disp: , Rfl:  .  senna-docusate (SENOKOT-S) 8.6-50 MG tablet, Take 1 tablet by mouth 2 (two) times daily., Disp: , Rfl:  .  tamsulosin (FLOMAX) 0.4 MG CAPS capsule, Take 0.4 mg by mouth daily., Disp: , Rfl:   Exam: Current vital signs: BP (!) 144/89   Pulse 94   Temp 97.7 F (36.5 C) (Temporal)   Resp 18   SpO2 98%  Vital signs in last 24 hours: Temp:  [97.6 F (36.4 C)-98.9 F (37.2 C)] 97.7 F (36.5 C) (12/29 1200) Pulse Rate:  [80-123] 94 (12/29 1100) Resp:  [15-23] 18 (12/29 1100) BP: (119-156)/(72-101) 144/89 (12/29 1100) SpO2:  [93 %-100 %] 98 % (12/29 1100)  GENERAL: Awake, alert, sitting up in bed, no acute distress HEENT: - Normocephalic and atraumatic LUNGS - Normal respiratory effort.  CV - irregular rate and rhythm on telemetry, extremities warm without edema ABDOMEN - Soft, non-distended Ext: warm, well perfused  NEURO:  Mental  Status: awake, alert, and oriented to person, age, month, and time. Pleasant and cooperative. Disoriented to situation, initially states that he is in the hospital for recurrent pneumonia. When asked about his recent stroke, he is able to recall his hospitalization. Patient claims that he has been living at home with his wife, per phone conversation with his daughter, he was discharged 12/27 to a SNF and then spent last night in the ED. Patient is unable to give a clear and concise history of presentation. No signs of neglect noted.  Speech/Language: Naming, repetition, fluency, and comprehension intact. Patient has subtle signs of receptive aphasia during conversation.  Cranial Nerves:  II: PERRL 3 mm/brisk. Visual fields full.  III, IV, VI: EOMI. Tracks examiner without difficulty. No ptosis noted.  V: Facial sensation is intact and symmetrical to light touch.  VII: Face is symmetrical resting and smiling. Able to puff cheeks and raise eyebrows.  VIII: Hearing intact to voice IX, X: Phonation normal.  XI: Shoulder shrug strong and equal bilaterally  XII: Tongue protrudes midline Motor: Strength 5/5 bilaterally in upper and lower extremities. No pronator drift.  Tone is normal. Bulk is normal.  Sensation- Intact to light touch bilaterally in all four extremities. No extinction. 2 point discrimination intact.  Coordination: FTN intact bilaterally. HKS intact bilaterally.  DTRs: 2+ throughout.  Plantars: Toes are downgoing bilaterally Gait- deferred  NIHSS 1a Level of Conscious.: 0 1b LOC Questions: 0 1c LOC Commands: 0 2 Best Gaze: 0 3 Visual: 0 4 Facial Palsy: 0 5a Motor Arm - left: 0 5b Motor Arm - Right: 0 6a Motor Leg - Left: 0 6b Motor Leg - Right: 0 7 Limb Ataxia: 0 8 Sensory: 0 9 Best Language: 0 10 Dysarthria: 0 11 Extinct. and Inatten.: 0 TOTAL: 0  Labs I have reviewed labs in epic and the results pertinent to this consultation are:  CBC    Component Value Date/Time    WBC 8.8 08/16/2020 0737   RBC 4.22 08/16/2020 0737   HGB 11.5 (L) 08/16/2020 0737   HGB 12.0 (L) 10/25/2019 1011   HCT 35.1 (L) 08/16/2020 0737   HCT 37.6 10/25/2019 1011   PLT 262 08/16/2020 0737   PLT 177 10/25/2019 1011   MCV 83.2 08/16/2020 0737   MCV 83 10/25/2019 1011   MCH 27.3 08/16/2020 0737   MCHC 32.8 08/16/2020 0737   RDW 15.9 (H) 08/16/2020 0737   RDW 14.4 10/25/2019 1011   LYMPHSABS 1.8 08/16/2020 0737   LYMPHSABS 1.5 10/25/2019 1011   MONOABS 0.5 08/16/2020 0737   EOSABS 0.1 08/16/2020 0737   EOSABS 0.0 10/25/2019 1011   BASOSABS 0.2 (H) 08/16/2020 0737   BASOSABS 0.0 10/25/2019 1011   CMP     Component Value Date/Time   NA 140 08/16/2020 0737   NA 144 10/25/2019 1011   K 4.1 08/16/2020 0737   CL 107 08/16/2020 0737   CO2 22 08/16/2020 0737   GLUCOSE 120 (H) 08/16/2020 0737   BUN 26 (H) 08/16/2020 0737   BUN 34 (H) 10/25/2019 1011   CREATININE 1.69 (H) 08/16/2020 0737   CALCIUM 8.8 (L) 08/16/2020 0737   PROT 6.2 (L) 08/16/2020 0737   PROT 5.9 (L) 10/04/2019 1135   ALBUMIN 2.5 (L) 08/16/2020 0737   ALBUMIN 3.5 (L) 10/04/2019 1135   AST 33 08/16/2020 0737   ALT 27 08/16/2020 0737   ALKPHOS 56 08/16/2020 0737   BILITOT 0.5 08/16/2020 0737   BILITOT 0.4 10/04/2019 1135   GFRNONAA 39 (L) 08/16/2020 0737   GFRAA 52 (L) 12/13/2019 0019   Lipid Panel     Component Value Date/Time   CHOL 113 08/03/2020 0415   TRIG 106 08/03/2020 0415   HDL 49 08/03/2020 0415   CHOLHDL 2.3 08/03/2020 0415   VLDL 21 08/03/2020 0415   LDLCALC 43 08/03/2020 0415   Imaging I have reviewed the images obtained:  CT-scan of the brain  IMPRESSION: Subacute infarction in the right PCA territory affecting the posteromedial temporal lobe and occipital lobe. Mild low-density and swelling but no gross hemorrhage. Small acute infarctions shown by MRI in the left hemisphere are not specifically visible by CT. No new insult is seen.  Impression: Mr. Guard is an  86 year old male with a significant PMHx including atrial fibrillation (not previously on anticoagulation prior to 12/20), hypertension, and a recent history of CVA. Recent hospitalization complicated by acute delirium and he was subsequently placed on Seroquel. He was discharged to [facility] facility 12/27  where he was not given medications because of lack of availability and was brought back to the ED 12/28. Family with concerns for increasing sundowning and confusion. Likely an exacerbation of delirium present during hospitalization and at discharge 12/27 secondary to CVA and not receiving medications at SNF. - Family requesting to find a provider willing to admit Mr. Arey to the hospital until they are able to find different and better placement for him - CT head negative for new insult - Confusion present during recent hospitalization and at discharge 12/27 without new features. He was discharged with Seroquel for ongoing confusion and delirium with recommendations to stop once home from rehab. Suspect persistent delirium in the setting of recent hospitalization and CVA, now exacerbated by environmental change and from not receiving medication.   - Confusion less likely from infectious process without evidence of leukocytosis.    Recommendations: - Continue Seroquel 12.5 mg BID for delirium - Trial of trazodone 50 mg po qhs for sleep - Continue to encourage adequate PO intake - Avoid alcohol - Encourage normal sleep/wake cycles with exposure to light during the day; minimize naps  Pt seen by NP/Neuro and later by MD.  Anibal Henderson, AGAC-NP [facility] Pager: [phone]  I have seen and examined the patient. I have formulated the assessment and recommendations. 86 year old male with waxing and waning confusion following multifocal cardioembolic strokes. Exam reveals cognitive deficits without agitation. Plan is for placement at a new rehab facility and  continuation of the medications recommended at the time of his recent discharge following stroke work up here at [facility].  Electronically signed: Dr. Kerney Elbe

## 2020-08-16 NOTE — NC FL2 (Signed)
[place] MEDICAID FL2 LEVEL OF CARE SCREENING TOOL     IDENTIFICATION  Patient Name: Chris Peterson Birthdate: 06-10-1930 Sex: male Admission Date (Current Location): 2020-08-31  [facility] and [place] Number:  Producer, television/film/video and Address:  [facility], [address], [place], [place] [address]      Provider Number: [id number]  Attending Physician Name and Address:  Alvira Monday, MD  Relative Name and Phone Number:       Current Level of Care: Hospital Recommended Level of Care: Skilled Nursing Facility Prior Approval Number:    Date Approved/Denied:   PASRR Number: [id number]  Discharge Plan: SNF    Current Diagnoses: Patient Active Problem List   Diagnosis Date Noted  . HLD (hyperlipidemia) 08/08/2020  . Acute renal failure superimposed on stage 3b chronic kidney disease (HCC) 08/08/2020  . History of GI bleed 08/08/2020  . Sundowning 08/08/2020  . PAF (paroxysmal atrial fibrillation) (HCC)   . AAA (abdominal aortic aneurysm) without rupture (HCC)   . Essential hypertension   . Alcohol abuse   . CVA (cerebral vascular accident) (HCC) 08/02/2020  . Pericarditis 02/23/2020  . Postop check 12/22/2019  . Diastolic heart failure (HCC) 12/01/2019  . GI bleed 03/13/2018  . Anemia associated with acute blood loss 03/13/2018  . Melena   . Acute gastric ulcer with hemorrhage   . Pericardial effusion   . Atherosclerotic renal artery stenosis, bilateral (HCC)   . HTN (hypertension) 11/28/2011  . AAA (abdominal aortic aneurysm) (HCC) 11/28/2011  . CKD (chronic kidney disease) 11/28/2011  . Diabetes mellitus type II 11/28/2011  . Tobacco abuse 11/28/2011    Orientation RESPIRATION BLADDER Height & Weight     Self,Time,Situation,Place  Normal Incontinent Weight:   Height:     BEHAVIORAL SYMPTOMS/MOOD NEUROLOGICAL BOWEL NUTRITION STATUS      Continent Diet  AMBULATORY STATUS COMMUNICATION OF NEEDS Skin   Limited Assist Verbally  Bruising                       Personal Care Assistance Level of Assistance  Bathing,Dressing,Feeding Bathing Assistance: Limited assistance Feeding assistance: Independent Dressing Assistance: Limited assistance     Functional Limitations Info  Sight,Hearing,Speech Sight Info: Impaired Hearing Info: Adequate Speech Info: Adequate    SPECIAL CARE FACTORS FREQUENCY  PT (By licensed PT),OT (By licensed OT),Speech therapy     PT Frequency: 5x weekly OT Frequency: 5x weekly     Speech Therapy Frequency: 5x weekly      Contractures Contractures Info: Not present    Additional Factors Info  Code Status,Psychotropic Code Status Info: Full   Psychotropic Info: Seroquel 12.5 mg bid         Current Medications (08/16/2020):  This is the current hospital active medication list Current Facility-Administered Medications  Medication Dose Route Frequency Provider Last Rate Last Admin  . amLODipine (NORVASC) tablet 5 mg  5 mg Oral Daily Alvira Monday, MD   5 mg at 08/16/20 1021  . apixaban (ELIQUIS) tablet 2.5 mg  2.5 mg Oral BID Alvira Monday, MD      . carvedilol (COREG) tablet 12.5 mg  12.5 mg Oral BID Alvira Monday, MD   12.5 mg at 08/16/20 1020  . cephALEXin (KEFLEX) capsule 500 mg  500 mg Oral Q12H Alvira Monday, MD   500 mg at 08/16/20 1021  . doxycycline (VIBRA-TABS) tablet 100 mg  100 mg Oral Q12H Alvira Monday, MD   100 mg at 08/16/20  1021  . QUEtiapine (SEROQUEL) tablet 12.5 mg  12.5 mg Oral BID Gareth Morgan, MD       Current Outpatient Medications  Medication Sig Dispense Refill  . amLODipine (NORVASC) 5 MG tablet Take 1 tablet (5 mg total) by mouth daily.    [name] apixaban (ELIQUIS) 2.5 MG TABS tablet Take 1 tablet (2.5 mg total) by mouth 2 (two) times daily. 60 tablet   . carvedilol (COREG) 12.5 MG tablet Take 12.5 mg by mouth 2 (two) times daily.    . cholecalciferol (VITAMIN D) 25 MCG (1000 UNIT) tablet Take 1 tablet (1,000 Units total) by  mouth daily.    . QUEtiapine (SEROQUEL) 25 MG tablet Take 0.5 tablets (12.5 mg total) by mouth 2 (two) times daily.    . rosuvastatin (CRESTOR) 40 MG tablet Take 40 mg by mouth daily.    [name] senna-docusate (SENOKOT-S) 8.6-50 MG tablet Take 1 tablet by mouth 2 (two) times daily.    . tamsulosin (FLOMAX) 0.4 MG CAPS capsule Take 0.4 mg by mouth daily.       Discharge Medications: Please see discharge summary for a list of discharge medications.  Relevant Imaging Results:  Relevant Lab Results:   Additional Information SSN: [id number]  Archie Endo, LCSW

## 2020-08-16 NOTE — Progress Notes (Signed)
CSW received notification regarding this patient and his family's desires for a new facility for SNF placement. CSW met with patient and his daughter Remo Lipps at bedside. Remo Lipps included her two sisters via phone so they could contribute to the conversation. The patient's family is dissatisfied with the care he received during his short stay at [facility]. CSW explained to family that a search would begin for new placement, however due to the patient's insurance [facility]) that options are going to be limited. Family agreed to expand the search outside of [facility]. Family was advised that they have 24 hours to make a decision once bed offers are received and if they are unable to come to a conclusion that they will be responsible for assuming care for the patient.  CSW will fax the patient's information out for review.  Madilyn Fireman, MSW, LCSW-A Transitions of Care  Clinical Social Worker I [facility] Emergency Departments  Medical ICU [phone]

## 2020-08-16 NOTE — ED Notes (Signed)
Please call daughter Marylu Lund @ [phone]--for a status update--daughter is very upset because he came EMS and has had a stroke--I apologized and told her the situation--when have a lot of critically ill patients and he is not being neglected

## 2020-08-16 NOTE — ED Provider Notes (Signed)
[facility] EMERGENCY DEPARTMENT Provider Note   CSN: [id number] Arrival date & time: 08/05/2020  1556     History Chief Complaint  Patient presents with  . Shoulder Pain    Chris Peterson is a 86 y.o. male.  HPI     86yo male hypertension, hyperlipidemia, atrial fibrillation, pericardial effusion, recent admission for CVA, acute on chronic kidney disease, prior GI bleed but no problems for some time and started on eliquis, discharge to [facility] who presents with concern  who presents with concern for shoulder pain and increasing confusion.  Daughter reports she has concerns regarding his care at the facility.  Reports that when they arrived to the facility, she needed to ask the nurse to give him his medications around 630PM and she asked "which medications" and had not looked at them.  He had not worked with PT over the weekend as it was Medical sales representative and the tech at the facility seemed to think he appeared weaker than he had been when he arrived.  After around 1-1.5 days at the facility they re-presented to the ED and unfortunately have been waiting in the ED since last night.  Daughter reports he had some confusion at the hospital but feels it worsened on arrival to the facility.  He waxes and wanes, at times seems to be with it and other times has global confusion.  He had sundowning while in the hospital and was started on seroquel.  He has complained of shoulder pain, worse top part of arm which has been present since he was in the hospital.  Initially denies chest pain but then reports he has had some pain to the lower left lung that has been present and worse with movement and palpation.  Shoulder pain also worse with movement/palpiation.  Denies dyspnea, nausea, vomiting, diaphoresis.  No new falls, no fever.  Reports has had cough for about one month.    Past Medical History:  Diagnosis Date  . AAA (abdominal aortic aneurysm) ([place])   . Atherosclerotic  renal artery stenosis, bilateral (HCC)    Bilateral renal angiography 11/27/11, Right 70% prox, Left 90% prox s/p LRA PTA/Stenting - 6.0x65m ;  12/12/2011 Renal Duplex: Patent LRA stent & > 60% RRA ost stenosis  . Atrial fibrillation ([facility] 11/19/2019  . Blood transfusion    S/P "lost blood due to hemorrhoids"  . Complication of anesthesia 2013   Severe Diarrhea aftter renal artery  . Dyspnea    with effusion  . Dysrhythmia    "fast one time"  . Hemorrhoids   . High cholesterol   . HTN (hypertension)   . Kidney disease, chronic, stage III (moderate, EGFR 30-59 ml/min) (HCC)    [facility]  . Pericardial effusion    s/p pericardiocentesis 11/2019 // reaccumulated >> s/p window 12/2019 // Echo 9/21: EF 60-65, no RWMA, moderate LVH, normal RVSF, severe BAE, small effusion posterior to the left ventricle (no evidence of tamponade), mild-moderate MR  . Pre-diabetes   . Sciatica     Patient Active Problem List   Diagnosis Date Noted  . HLD (hyperlipidemia) 08/08/2020  . Acute renal failure superimposed on stage 3b chronic kidney disease ([facility] 08/08/2020  . History of GI bleed 08/08/2020  . Sundowning 08/08/2020  . PAF (paroxysmal atrial fibrillation) ([facility]   . AAA (abdominal aortic aneurysm) without rupture ([facility]   . Essential hypertension   . Alcohol abuse   . CVA (cerebral vascular accident) ([facility] 08/02/2020  . Pericarditis  02/23/2020  . Postop check 12/22/2019  . Diastolic heart failure ([place]) 12/01/2019  . GI bleed 03/13/2018  . Anemia associated with acute blood loss 03/13/2018  . Melena   . Acute gastric ulcer with hemorrhage   . Pericardial effusion   . Atherosclerotic renal artery stenosis, bilateral ([place])   . HTN (hypertension) 11/28/2011  . AAA (abdominal aortic aneurysm) ([place]) 11/28/2011  . CKD (chronic kidney disease) 11/28/2011  . Diabetes mellitus type II 11/28/2011  . Tobacco abuse 11/28/2011    Past Surgical History:  Procedure Laterality Date  .  ABDOMINAL ANGIOGRAM N/A 11/27/2011   Procedure: ABDOMINAL ANGIOGRAM;  Surgeon: Sherren Mocha, MD;  Location[facility] CATH LAB;  Service: Cardiovascular;  Laterality: N/A;  . BIOPSY  03/13/2018   Procedure: BIOPSY;  Surgeon: Ladene Artist, MD;  Location: [facility] ENDOSCOPY;  Service: Endoscopy;;  . CATARACT EXTRACTION W/ INTRAOCULAR LENS  IMPLANT, BILATERAL  1990's  . ESOPHAGOGASTRODUODENOSCOPY N/A 03/13/2018   Procedure: ESOPHAGOGASTRODUODENOSCOPY (EGD);  Surgeon: Ladene Artist, MD;  Location: [facility] ENDOSCOPY;  Service: Endoscopy;  Laterality: N/A;  . HOT HEMOSTASIS N/A 03/13/2018   Procedure: HOT HEMOSTASIS (ARGON PLASMA COAGULATION/BICAP);  Surgeon: Ladene Artist, MD;  Location: [facility] ENDOSCOPY;  Service: Endoscopy;  Laterality: N/A;  . NASAL SEPTUM SURGERY  1980's  . PERICARDIOCENTESIS N/A 10/27/2019   Procedure: PERICARDIOCENTESIS;  Surgeon: Sherren Mocha, MD;  Location: [facility] CV LAB;  Service: Cardiovascular;  Laterality: N/A;  . RENAL ANGIOGRAM Bilateral 11/27/2011   Procedure: RENAL ANGIOGRAM;  Surgeon: Sherren Mocha, MD;  Location: [facility] CATH LAB;  Service: Cardiovascular;  Laterality: Bilateral;  possible PTA/stent  . RENAL ARTERY STENT  11/27/11  . RIGHT HEART CATH N/A 10/27/2019   Procedure: RIGHT HEART CATH;  Surgeon: Sherren Mocha, MD;  Location: [facility] CV LAB;  Service: Cardiovascular;  Laterality: N/A;  . SUBXYPHOID PERICARDIAL WINDOW N/A 12/09/2019   Procedure: SUBXYPHOID PERICARDIAL WINDOW;  Surgeon: Ivin Poot, MD;  Location: [facility];  Service: Thoracic;  Laterality: N/A;  . TEE WITHOUT CARDIOVERSION N/A 12/09/2019   Procedure: TRANSESOPHAGEAL ECHOCARDIOGRAM (TEE);  Surgeon: Prescott Gum, Collier Salina, MD;  Location: [facility] OR;  Service: Thoracic;  Laterality: N/A;       Family History  Problem Relation Age of Onset  . Hypertension Mother   . Hypertension Brother     Social History   Tobacco Use  . Smoking status: Current Every Day Smoker    Years: 65.00    Types: Pipe,  Cigarettes    Last attempt to quit: 08/19/1996    Years since quitting: 24.0  . Smokeless tobacco: Never Used  . Tobacco comment: 12/07/2019=- smokes a pipe  Vaping Use  . Vaping Use: Never used  Substance Use Topics  . Alcohol use: Yes    Alcohol/week: 10.0 standard drinks    Types: 10 Shots of liquor per week    Comment: 12/07/2019 - drinks 1 - 2 oz a day  . Drug use: No    Home Medications Prior to Admission medications   Medication Sig Start Date End Date Taking? Authorizing Provider  amLODipine (NORVASC) 5 MG tablet Take 1 tablet (5 mg total) by mouth daily. 08/06/2020   Dwyane Dee, MD  apixaban (ELIQUIS) 2.5 MG TABS tablet Take 1 tablet (2.5 mg total) by mouth 2 (two) times daily. 08/14/20   Dwyane Dee, MD  carvedilol (COREG) 12.5 MG tablet Take 12.5 mg by mouth 2 (two) times daily. 02/28/20   [provider]  cholecalciferol (VITAMIN D) 25 MCG (1000  UNIT) tablet Take 1 tablet (1,000 Units total) by mouth daily. 12/12/19   Nani Skillern, PA-C  QUEtiapine (SEROQUEL) 25 MG tablet Take 0.5 tablets (12.5 mg total) by mouth 2 (two) times daily. 08/14/20   Dwyane Dee, MD  rosuvastatin (CRESTOR) 40 MG tablet Take 40 mg by mouth daily.    [provider]  senna-docusate (SENOKOT-S) 8.6-50 MG tablet Take 1 tablet by mouth 2 (two) times daily. 08/14/20   Dwyane Dee, MD  tamsulosin (FLOMAX) 0.4 MG CAPS capsule Take 0.4 mg by mouth daily.    [provider]    Allergies    Patient has no known allergies.  Review of Systems   Review of Systems  Constitutional: Negative for appetite change and fever.  Respiratory: Positive for cough (one month). Negative for shortness of breath.   Cardiovascular: Positive for chest pain (left lower lung worse with palpation and movements per pt). Negative for leg swelling.  Gastrointestinal: Negative for abdominal pain, nausea and vomiting.  Genitourinary: Difficulty urinating: foley in place.  Skin: Negative  for rash.  Psychiatric/Behavioral: Positive for confusion.    Physical Exam Updated Vital Signs BP (!) 125/92 (BP Location: Right Arm)   Pulse (!) 120   Temp 97.7 F (36.5 C) (Temporal)   Resp 20   SpO2 92%   Physical Exam Vitals and nursing note reviewed.  Constitutional:      General: He is not in acute distress.    Appearance: He is well-developed and well-nourished. He is not diaphoretic.  HENT:     Head: Normocephalic and atraumatic.  Eyes:     Extraocular Movements: EOM normal.     Conjunctiva/sclera: Conjunctivae normal.  Cardiovascular:     Rate and Rhythm: Normal rate. Rhythm irregular.     Pulses: Intact distal pulses.     Heart sounds: Normal heart sounds. No murmur heard. No friction rub. No gallop.   Pulmonary:     Effort: Pulmonary effort is normal. No respiratory distress.     Breath sounds: Normal breath sounds. No wheezing or rales.  Abdominal:     General: There is no distension.     Palpations: Abdomen is soft.     Tenderness: There is no abdominal tenderness. There is no guarding.  Musculoskeletal:        General: No edema.     Cervical back: Normal range of motion.  Skin:    General: Skin is warm and dry.  Neurological:     Mental Status: He is alert and oriented to person, place, and time.     ED Results / Procedures / Treatments   Labs (all labs ordered are listed, but only abnormal results are displayed) Labs Reviewed  CBC WITH DIFFERENTIAL/PLATELET - Abnormal; Notable for the following components:      Result Value   Hemoglobin 11.5 (*)    HCT 35.1 (*)    RDW 15.9 (*)    Basophils Absolute 0.2 (*)    All other components within normal limits  COMPREHENSIVE METABOLIC PANEL - Abnormal; Notable for the following components:   Glucose, Bld 120 (*)    BUN 26 (*)    Creatinine, Ser 1.69 (*)    Calcium 8.8 (*)    Total Protein 6.2 (*)    Albumin 2.5 (*)    GFR, Estimated 39 (*)    All other components within normal limits  URINALYSIS,  ROUTINE W REFLEX MICROSCOPIC - Abnormal; Notable for the following components:   Hgb urine dipstick MODERATE (*)  Protein, ur 30 (*)    All other components within normal limits  SARS CORONAVIRUS 2 (TAT 6-24 HRS)  URINE CULTURE  TROPONIN I (HIGH SENSITIVITY)  TROPONIN I (HIGH SENSITIVITY)    EKG EKG Interpretation  Date/Time:  Wednesday August 16 2020 07:34:20 EST Ventricular Rate:  100 PR Interval:    QRS Duration: 142 QT Interval:  383 QTC Calculation: 494 R Axis:   32 Text Interpretation: Atrial fibrillation IVCD, consider atypical RBBB Since prior ECG< no significant change Confirmed by Gareth Morgan [phone]) on 08/16/2020 8:20:08 AM   Radiology CT Head Wo Contrast  Result Date: 08/16/2020 CLINICAL DATA:  Delirium. Recent right PCA territory stroke. Recent left frontal and parietooccipital strokes. EXAM: CT HEAD WITHOUT CONTRAST TECHNIQUE: Contiguous axial images were obtained from the base of the skull through the vertex without intravenous contrast. COMPARISON:  MRI 08/02/2020 FINDINGS: Brain: No acute finding affecting the brainstem or cerebellum. Few old small vessel infarctions. Cerebral hemispheres show subacute infarction in the right PCA territory affecting the posteromedial temporal lobe and occipital lobe. Mild low-density and swelling but no gross hemorrhage. Small acute infarction shown by MRI in the left hemisphere are not specifically visible. No new insult is seen. No mass lesion, hemorrhage, hydrocephalus or extra-axial collection. Vascular: There is atherosclerotic calcification of the major vessels at the base of the brain. Skull: Negative Sinuses/Orbits: Clear/normal Other: None IMPRESSION: Subacute infarction in the right PCA territory affecting the posteromedial temporal lobe and occipital lobe. Mild low-density and swelling but no gross hemorrhage. Small acute infarctions shown by MRI in the left hemisphere are not specifically visible by CT. No new insult  is seen. Electronically Signed   By: Nelson Chimes M.D.   On: 08/16/2020 08:11   DG Chest Portable 1 View  Result Date: 08/16/2020 CLINICAL DATA:  Sudden onset left shoulder pain.  No injury. EXAM: PORTABLE CHEST 1 VIEW COMPARISON:  CT chest dated June 22, 2020. Chest x-ray dated February 23, 2020. FINDINGS: Stable cardiomediastinal silhouette. Atherosclerotic calcification of the aortic arch. Normal pulmonary vascularity. New retrocardiac opacity with silhouetting of the left hemidiaphragm. Probable small left pleural effusion. No pneumothorax. No acute osseous abnormality. IMPRESSION: 1. New left lower lobe atelectasis versus infiltrate. Probable small left pleural effusion. Electronically Signed   By: Titus Dubin M.D.   On: 08/16/2020 08:07   DG Shoulder Left  Result Date: 08/12/2020 CLINICAL DATA:  Left lateral shoulder pain EXAM: LEFT SHOULDER - 2+ VIEW COMPARISON:  None. FINDINGS: Internal rotation, external rotation, and transscapular views of the left shoulder are obtained. No fracture, subluxation, or dislocation. Mild glenohumeral joint osteoarthritis. Prominent spurring on the inferior surface of the acromion process. Calcifications within the rotator cuff tendon consistent with calcific tendinopathy. Left chest is clear. IMPRESSION: 1. Mild degenerative changes of the left shoulder. No acute fracture. 2. Calcific tendinopathy of the rotator cuff. Electronically Signed   By: Randa Ngo M.D.   On: 08/14/2020 17:03    Procedures Procedures (including critical care time)  Medications Ordered in ED Medications  apixaban (ELIQUIS) tablet 2.5 mg (2.5 mg Oral Given 08/16/20 1202)  amLODipine (NORVASC) tablet 5 mg (5 mg Oral Given 08/16/20 1021)  carvedilol (COREG) tablet 12.5 mg (12.5 mg Oral Given 08/16/20 1020)  doxycycline (VIBRA-TABS) tablet 100 mg (100 mg Oral Given 08/16/20 1021)  cephALEXin (KEFLEX) capsule 500 mg (500 mg Oral Given 08/16/20 1021)  QUEtiapine (SEROQUEL) tablet  12.5 mg (has no administration in time range)  traZODone (DESYREL) tablet 50 mg (has no  administration in time range)  QUEtiapine (SEROQUEL) tablet 25 mg (25 mg Oral Given 08/16/20 1021)    ED Course  I have reviewed the triage vital signs and the nursing notes.  Pertinent labs & imaging results that were available during my care of the patient were reviewed by me and considered in my medical decision making (see chart for details).    MDM Rules/Calculators/A&P                          86yo male hypertension, hyperlipidemia, atrial fibrillation, pericardial effusion, recent admission for CVA, acute on chronic kidney disease, prior GI bleed but no problems for some time and started on eliquis, discharge to [facility] who presents with concern  who presents with concern for shoulder pain and increasing confusion.  Regarding increasing confusion--was noted to have sundowning while a the hospital.  Suspect delirium in setting of recent CVA exacerbated by change in environment.  CXR shows possible atelectasis vs infiltrate. He reports cough over a month or so, in setting of delirium and these symptoms/findings feel it is reasonable to treat for pneumonia. No hypoxia, no dyspnea, no indication for admission.  Not having new encephalopathy since discharge but rather continuation of prior delirium he experienced after his CVA and while in the hospital.  He was not described per providers in the hospital to appear to have seizure  Left shoulder pain is reproducible. Some bruising noted, suspect likely musculoskeletal pain. Troponin negative after days of symptoms and doubt ACS.  On eliquis, no dyspnea, no hypoxia, doubt PE. No signs of aortic dissection on exam or history.  XR shoulder shows degenerative changes and calcific tendinopathy.  In atrial fibrillation, rate controlled, vital signs stable.  He has contusions on the back of his arm and tenderness over this area and suspect this may also  contribute to pain.   Discussed that I do not see indication for medical admission, however will initiate po abx for possibility of infection contributing to delirium although it is also possible XR findings indicate atelectasis.  His decline is likely secondary to moderate sized PCA acute infarction of temporo-occipital lobes, acute cortical infarcts of left frontal lobe and left parieto-occipital junction diagnosed on previous admission.  Consulted Neurology to evaluate patient's decline.  Dr. Cheral Marker came to bedside to evaluate and feels findings secondary to recent CVA.    Discussed with family that I do not see indication for further emergency department treatment or admission at this time.  Discussed that I feel discharge to facility with transfer from facility to facility is most appropriate, however in discussion with Dr. Hulen Skains CMO and family, will continue to hold in Emergency Department as we await social work/placement.  Noted to have heart rate in ED to 90s-100s in atrial fibrillation. Will continue to monitor as he waits in ED and if he worsens would potentially meet inpatient criteria for atrial fibrillation, however at this time he is stable for discharge with rate controlled atrial fibrillation.  Ordered home meds, doxycycline/keflex for one week for potential CAP.      Final Clinical Impression(s) / ED Diagnoses Final diagnoses:  Acute pain of left shoulder  Sundowning  Recent cerebrovascular accident (CVA)  Community acquired pneumonia of left lower lobe of lung    Rx / DC Orders ED Discharge Orders    None       Gareth Morgan, MD 08/16/20 1605

## 2020-08-16 NOTE — ED Notes (Signed)
Pt found standing at end of stretcher. Pt had removed gown, brief and cardiac monitoring. Pt is confused and re oriented to surroundings

## 2020-08-16 NOTE — Telephone Encounter (Signed)
Daughter of the patient called back returning Carlyle's call from yesterday Per Kinnie Feil daughter needs to talk to a Triage Nurse

## 2020-08-16 NOTE — Care Management (Signed)
PT eval order placed with imminent discharge status.

## 2020-08-17 DIAGNOSIS — I63431 Cerebral infarction due to embolism of right posterior cerebral artery: Secondary | ICD-10-CM | POA: Diagnosis not present

## 2020-08-17 DIAGNOSIS — I313 Pericardial effusion (noninflammatory): Secondary | ICD-10-CM | POA: Diagnosis present

## 2020-08-17 DIAGNOSIS — Z20822 Contact with and (suspected) exposure to covid-19: Secondary | ICD-10-CM | POA: Diagnosis present

## 2020-08-17 DIAGNOSIS — F05 Delirium due to known physiological condition: Secondary | ICD-10-CM

## 2020-08-17 DIAGNOSIS — E1122 Type 2 diabetes mellitus with diabetic chronic kidney disease: Secondary | ICD-10-CM | POA: Diagnosis present

## 2020-08-17 DIAGNOSIS — K253 Acute gastric ulcer without hemorrhage or perforation: Secondary | ICD-10-CM | POA: Diagnosis present

## 2020-08-17 DIAGNOSIS — E785 Hyperlipidemia, unspecified: Secondary | ICD-10-CM | POA: Diagnosis present

## 2020-08-17 DIAGNOSIS — N179 Acute kidney failure, unspecified: Secondary | ICD-10-CM | POA: Diagnosis present

## 2020-08-17 DIAGNOSIS — I130 Hypertensive heart and chronic kidney disease with heart failure and stage 1 through stage 4 chronic kidney disease, or unspecified chronic kidney disease: Secondary | ICD-10-CM | POA: Diagnosis present

## 2020-08-17 DIAGNOSIS — Z66 Do not resuscitate: Secondary | ICD-10-CM | POA: Diagnosis present

## 2020-08-17 DIAGNOSIS — K649 Unspecified hemorrhoids: Secondary | ICD-10-CM | POA: Diagnosis present

## 2020-08-17 DIAGNOSIS — I480 Paroxysmal atrial fibrillation: Secondary | ICD-10-CM | POA: Diagnosis present

## 2020-08-17 DIAGNOSIS — M25512 Pain in left shoulder: Secondary | ICD-10-CM | POA: Diagnosis not present

## 2020-08-17 DIAGNOSIS — Z8673 Personal history of transient ischemic attack (TIA), and cerebral infarction without residual deficits: Secondary | ICD-10-CM | POA: Diagnosis not present

## 2020-08-17 DIAGNOSIS — T68XXXA Hypothermia, initial encounter: Secondary | ICD-10-CM | POA: Diagnosis present

## 2020-08-17 DIAGNOSIS — I5032 Chronic diastolic (congestive) heart failure: Secondary | ICD-10-CM | POA: Diagnosis present

## 2020-08-17 DIAGNOSIS — I10 Essential (primary) hypertension: Secondary | ICD-10-CM

## 2020-08-17 DIAGNOSIS — I63531 Cerebral infarction due to unspecified occlusion or stenosis of right posterior cerebral artery: Secondary | ICD-10-CM | POA: Diagnosis present

## 2020-08-17 DIAGNOSIS — I714 Abdominal aortic aneurysm, without rupture: Secondary | ICD-10-CM | POA: Diagnosis present

## 2020-08-17 DIAGNOSIS — I639 Cerebral infarction, unspecified: Secondary | ICD-10-CM | POA: Diagnosis not present

## 2020-08-17 DIAGNOSIS — F0390 Unspecified dementia without behavioral disturbance: Secondary | ICD-10-CM | POA: Diagnosis present

## 2020-08-17 DIAGNOSIS — Z515 Encounter for palliative care: Secondary | ICD-10-CM

## 2020-08-17 DIAGNOSIS — Z8249 Family history of ischemic heart disease and other diseases of the circulatory system: Secondary | ICD-10-CM | POA: Diagnosis not present

## 2020-08-17 DIAGNOSIS — M19012 Primary osteoarthritis, left shoulder: Secondary | ICD-10-CM | POA: Diagnosis present

## 2020-08-17 DIAGNOSIS — Z7901 Long term (current) use of anticoagulants: Secondary | ICD-10-CM | POA: Diagnosis not present

## 2020-08-17 DIAGNOSIS — E1169 Type 2 diabetes mellitus with other specified complication: Secondary | ICD-10-CM | POA: Diagnosis not present

## 2020-08-17 DIAGNOSIS — Z95828 Presence of other vascular implants and grafts: Secondary | ICD-10-CM | POA: Diagnosis not present

## 2020-08-17 DIAGNOSIS — Z79899 Other long term (current) drug therapy: Secondary | ICD-10-CM | POA: Diagnosis not present

## 2020-08-17 DIAGNOSIS — N1832 Chronic kidney disease, stage 3b: Secondary | ICD-10-CM | POA: Diagnosis present

## 2020-08-17 DIAGNOSIS — J189 Pneumonia, unspecified organism: Secondary | ICD-10-CM | POA: Diagnosis present

## 2020-08-17 LAB — CBG MONITORING, ED: Glucose-Capillary: 119 mg/dL — ABNORMAL HIGH (ref 70–99)

## 2020-08-17 LAB — URINE CULTURE

## 2020-08-17 MED ORDER — ATROPINE SULFATE 1 % OP SOLN
4.0000 [drp] | OPHTHALMIC | Status: DC | PRN
  Filled 2020-08-17: qty 2

## 2020-08-17 MED ORDER — HALOPERIDOL 0.5 MG PO TABS
0.5000 mg | ORAL_TABLET | ORAL | Status: DC | PRN
  Filled 2020-08-17: qty 1

## 2020-08-17 MED ORDER — LORAZEPAM 2 MG/ML PO CONC: 1.0000 mg | ORAL | Status: DC | PRN

## 2020-08-17 MED ORDER — ALBUTEROL SULFATE (2.5 MG/3ML) 0.083% IN NEBU: 2.5000 mg | INHALATION_SOLUTION | RESPIRATORY_TRACT | Status: DC | PRN

## 2020-08-17 MED ORDER — MORPHINE 100MG IN NS 100ML (1MG/ML) PREMIX INFUSION
1.0000 mg/h | INTRAVENOUS | Status: DC
  Administered 2020-08-17: 1 mg/h via INTRAVENOUS
  Filled 2020-08-17: qty 100

## 2020-08-17 MED ORDER — HALOPERIDOL LACTATE 5 MG/ML IJ SOLN: 0.5000 mg | INTRAMUSCULAR | Status: DC | PRN

## 2020-08-17 MED ORDER — LORAZEPAM 2 MG/ML PO CONC
1.0000 mg | Freq: Four times a day (QID) | ORAL | Status: DC
  Administered 2020-08-18: 1 mg via ORAL
  Filled 2020-08-17: qty 1

## 2020-08-17 MED ORDER — HALOPERIDOL LACTATE 2 MG/ML PO CONC
0.5000 mg | ORAL | Status: DC | PRN
  Filled 2020-08-17: qty 0.3

## 2020-08-17 MED ORDER — ONDANSETRON 4 MG PO TBDP: 4.0000 mg | ORAL_TABLET | Freq: Four times a day (QID) | ORAL | Status: DC | PRN

## 2020-08-17 MED ORDER — MORPHINE SULFATE (CONCENTRATE) 10 MG/0.5ML PO SOLN: 10.0000 mg | ORAL | Status: DC | PRN

## 2020-08-17 MED ORDER — ONDANSETRON HCL 4 MG/2ML IJ SOLN: 4.0000 mg | Freq: Four times a day (QID) | INTRAMUSCULAR | Status: DC | PRN

## 2020-08-17 MED ORDER — LORAZEPAM 2 MG/ML IJ SOLN
0.5000 mg | Freq: Four times a day (QID) | INTRAMUSCULAR | Status: DC | PRN
  Filled 2020-08-17: qty 1

## 2020-08-17 NOTE — Progress Notes (Addendum)
11:30am: CSW completed phone conference with the patient's wife, three daughters, Dr. Lindie Spruce, [facility] Director Wandra Mannan, and EDP Dr. Dalene Seltzer.   A palliative care consult was ordered to complete a GOC discussion with the family.  10am: CSW spoke with LaSha at [facility] - the facility does accept this patient's insurance, however he has not received his COVID vaccine and the facility no longer has the space to quarantine new admissions.  CSW spoke with Verlon Au at [facility] - the facility does not accept the patient's insurance.  Edwin Dada, MSW, LCSW-A Transitions of Care  Clinical Social Worker I [facility] Emergency Departments  Medical ICU [phone]

## 2020-08-17 NOTE — H&P (Signed)
History and Physical    BURAK ZERBE DUK:[id number] DOB: 27-May-1930 DOA: 08/08/2020  Referring MD/NP/PA: Benedetto Goad, PA-C PCP: Lawerance Cruel, MD  Patient coming from: [facility] via EMS  Chief Complaint: Left shoulder pain  I have personally briefly reviewed patient's old medical records in [place]   HPI: Chris Peterson is a 86 y.o. male with medical history significant of dementia, hypertension, hyperlipidemia, atrial fibrillation on Eliquis, CVA, AAA, pericardial effusion, kidney disease, and prior history of GI bleed presented from the facility on 12/28 with complaints of left shoulder pain and increasing confusion.  Patient just recently been hospitalized from 12/15-12/27 after being found to have a moderate sized right PCA territory infarct involving the temporal occipital lobes due to occlusion of the right P2 PCA.  Also noted to have acute cortical infarcts involving the left frontal lobe and left parietal occipital lobes.  During his hospitalization he was noted to have sundowning and has been started on Seroquel.  At discharge he had been sent to the rehab facility, but had not work with physical therapy over the weekend as it was Christmas.  Family had felt that the patient had gotten weaker during this time.  He claims pain in the left shoulder with was worse with it.  Also reported complaints of cough for last 1 month and chest pain.  ED Course: While in the emergency department from 12/28 patient was noted to have a increasing confusion for which acute delirium was suspected.  Chest x-ray was done due to complaints of cough and revealed concern for atelectasis versus infiltrate.  Patient was started on p.o. antibiotics doxycycline and Keflex for treatment of the possible pneumonia.  He was also evaluated by Dr. Cheral Marker of neurology due to reports of weakness, but this was suspected secondary to the recent stroke.  Patient did not meet inpatient criteria at that  time.  Social work has been consulted to possibly transfer patient to a different facility. COVID-19 screening was negative and patient had been placed in isolation in which family was unable to visit while awaiting placement.  Overnight on 12/29 patient was noted to be significantly agitated and needed multiple medications for sedation.    Palliative care had been consulted and had discussions with the patient's 3 daughters in regards to goals of care.  He had been found to be hypothermic with temperature of 94 F with decreased urine output for which it was suspected that the patient was approaching end-of-life.  Patient's daughters had agreed to admission for comfort measures only and management of agitation and end-of-life symptoms.  They had not been able to see him over the last day due to isolation protocols and attempts to try and send him to another nursing facility and were anxious to be with him.  [facility] called to admit.  Family is concerned with the patient jerking and seeming more restless and the possibility of him aspirating.  Review of Systems  Unable to perform ROS: Acuity of condition    Past Medical History:  Diagnosis Date  . AAA (abdominal aortic aneurysm) ([place])   . Atherosclerotic renal artery stenosis, bilateral (HCC)    Bilateral renal angiography 11/27/11, Right 70% prox, Left 90% prox s/p LRA PTA/Stenting - 6.0x69m ;  12/12/2011 Renal Duplex: Patent LRA stent & > 60% RRA ost stenosis  . Atrial fibrillation ([facility] 11/19/2019  . Blood transfusion    S/P "lost blood due to hemorrhoids"  . Complication of anesthesia 2013  Severe Diarrhea aftter renal artery  . Dyspnea    with effusion  . Dysrhythmia    "fast one time"  . Hemorrhoids   . High cholesterol   . HTN (hypertension)   . Kidney disease, chronic, stage III (moderate, EGFR 30-59 ml/min) (HCC)    sees [facility]  . Pericardial effusion    s/p pericardiocentesis 11/2019 // reaccumulated >> s/p window 12/2019 //  Echo 9/21: EF 60-65, no RWMA, moderate LVH, normal RVSF, severe BAE, small effusion posterior to the left ventricle (no evidence of tamponade), mild-moderate MR  . Pre-diabetes   . Sciatica     Past Surgical History:  Procedure Laterality Date  . ABDOMINAL ANGIOGRAM N/A 11/27/2011   Procedure: ABDOMINAL ANGIOGRAM;  Surgeon: Sherren Mocha, MD;  Location: [facility] CATH LAB;  Service: Cardiovascular;  Laterality: N/A;  . BIOPSY  03/13/2018   Procedure: BIOPSY;  Surgeon: Ladene Artist, MD;  Location: [facility] ENDOSCOPY;  Service: Endoscopy;;  . CATARACT EXTRACTION W/ INTRAOCULAR LENS  IMPLANT, BILATERAL  1990's  . ESOPHAGOGASTRODUODENOSCOPY N/A 03/13/2018   Procedure: ESOPHAGOGASTRODUODENOSCOPY (EGD);  Surgeon: Ladene Artist, MD;  Location: [facility] ENDOSCOPY;  Service: Endoscopy;  Laterality: N/A;  . HOT HEMOSTASIS N/A 03/13/2018   Procedure: HOT HEMOSTASIS (ARGON PLASMA COAGULATION/BICAP);  Surgeon: Ladene Artist, MD;  Location: [facility] ENDOSCOPY;  Service: Endoscopy;  Laterality: N/A;  . NASAL SEPTUM SURGERY  1980's  . PERICARDIOCENTESIS N/A 10/27/2019   Procedure: PERICARDIOCENTESIS;  Surgeon: Sherren Mocha, MD;  Location: [facility] CV LAB;  Service: Cardiovascular;  Laterality: N/A;  . RENAL ANGIOGRAM Bilateral 11/27/2011   Procedure: RENAL ANGIOGRAM;  Surgeon: Sherren Mocha, MD;  Location: [facility] CATH LAB;  Service: Cardiovascular;  Laterality: Bilateral;  possible PTA/stent  . RENAL ARTERY STENT  11/27/11  . RIGHT HEART CATH N/A 10/27/2019   Procedure: RIGHT HEART CATH;  Surgeon: Sherren Mocha, MD;  Location: [facility] CV LAB;  Service: Cardiovascular;  Laterality: N/A;  . SUBXYPHOID PERICARDIAL WINDOW N/A 12/09/2019   Procedure: SUBXYPHOID PERICARDIAL WINDOW;  Surgeon: Ivin Poot, MD;  Location: [facility];  Service: Thoracic;  Laterality: N/A;  . TEE WITHOUT CARDIOVERSION N/A 12/09/2019   Procedure: TRANSESOPHAGEAL ECHOCARDIOGRAM (TEE);  Surgeon: Prescott Gum, Collier Salina, MD;  Location: [facility];  Service:  Thoracic;  Laterality: N/A;     reports that he has been smoking pipe and cigarettes. He has smoked for the past 65.00 years. He has never used smokeless tobacco. He reports current alcohol use of about 10.0 standard drinks of alcohol per week. He reports that he does not use drugs.  No Known Allergies  Family History  Problem Relation Age of Onset  . Hypertension Mother   . Hypertension Brother     Prior to Admission medications   Medication Sig Start Date End Date Taking? Authorizing Provider  amLODipine (NORVASC) 5 MG tablet Take 1 tablet (5 mg total) by mouth daily. 08/03/2020   Dwyane Dee, MD  apixaban (ELIQUIS) 2.5 MG TABS tablet Take 1 tablet (2.5 mg total) by mouth 2 (two) times daily. 08/14/20   Dwyane Dee, MD  carvedilol (COREG) 12.5 MG tablet Take 12.5 mg by mouth 2 (two) times daily. 02/28/20   [provider]  cholecalciferol (VITAMIN D) 25 MCG (1000 UNIT) tablet Take 1 tablet (1,000 Units total) by mouth daily. 12/12/19   Nani Skillern, PA-C  QUEtiapine (SEROQUEL) 25 MG tablet Take 0.5 tablets (12.5 mg total) by mouth 2 (two) times daily. 08/14/20   Dwyane Dee, MD  rosuvastatin ([place])  40 MG tablet Take 40 mg by mouth daily.    [provider]  senna-docusate (SENOKOT-S) 8.6-50 MG tablet Take 1 tablet by mouth 2 (two) times daily. 08/14/20   Dwyane Dee, MD  tamsulosin (FLOMAX) 0.4 MG CAPS capsule Take 0.4 mg by mouth daily.    [provider]    Physical Exam:  Constitutional: Ill-appearing elderly male Vitals:   08/16/20 1700 08/16/20 2230 08/17/20 0843 08/17/20 1354  BP: (!) 146/93 (!) 153/106 136/88 112/78  Pulse: 94 95 98 92  Resp: (!) _0 Temp:  97.9 F (36.6 C)  (!) 94 F (34.4 C)  TempSrc:  Oral  Rectal  SpO2: 96% 94% 96% 99%   Eyes: PERRL, lids and conjunctivae normal ENMT: Mucous membranes are dry. Posterior pharynx clear of any exudate or lesions.  Neck: normal, supple, no masses, no  thyromegaly Respiratory: Rales appreciated in the lower fields of both lungs. Cardiovascular: Regular rate and rhythm, no murmurs / rubs / gallops. No extremity edema. 2+ pedal pulses. No carotid bruits.  Abdomen: no tenderness, no masses palpated. No hepatosplenomegaly. Bowel sounds positive.  Musculoskeletal: no clubbing / cyanosis. No joint deformity upper and lower extremities. Good ROM, no contractures. Normal muscle tone.  Skin: no rashes, lesions, ulcers. No induration Neurologic: CN 2-12 grossly intact. Sensation intact, DTR normal. Strength 5/5 in all 4.  Psychiatric:   Lethargic, but we will to stimuli and answer questions.     Labs on Admission: I have personally reviewed following labs and imaging studies  CBC: Recent Labs  Lab 08/11/20 0251 08/12/20 0320 08/13/20 0230 08/16/20 0737  WBC 7.8 7.4 7.3 8.8  NEUTROABS 4.6 4.3 4.1 6.2  HGB 10.6* 11.0* 10.6* 11.5*  HCT 33.7* 34.5* 34.1* 35.1*  MCV 83.8 83.9 83.8 83.2  PLT 182 213 229 428   Basic Metabolic Panel: Recent Labs  Lab 08/11/20 0251 08/12/20 0320 08/13/20 0230 08/16/20 0737  NA 140 138 138 140  K 3.7 3.8 3.9 4.1  CL 110 108 108 107  CO2 21* 20* 21* 22  GLUCOSE 112* 103* 98 120*  BUN 25* 26* 25* 26*  CREATININE 1.61* 1.64* 1.67* 1.69*  CALCIUM 8.1* 8.2* 8.1* 8.8*  MG 1.7 1.9 1.8  --    GFR: CrCl cannot be calculated (Unknown ideal weight.). Liver Function Tests: Recent Labs  Lab 08/16/20 0737  AST 33  ALT 27  ALKPHOS 56  BILITOT 0.5  PROT 6.2*  ALBUMIN 2.5*   No results for input(s): LIPASE, AMYLASE in the last 168 hours. No results for input(s): AMMONIA in the last 168 hours. Coagulation Profile: No results for input(s): INR, PROTIME in the last 168 hours. Cardiac Enzymes: No results for input(s): CKTOTAL, CKMB, CKMBINDEX, TROPONINI in the last 168 hours. BNP (last 3 results) Recent Labs    10/04/19 1135  PROBNP 1,343*   HbA1C: No results for input(s): HGBA1C in the last 72  hours. CBG: Recent Labs  Lab 08/17/20 1420  GLUCAP 119*   Lipid Profile: No results for input(s): CHOL, HDL, LDLCALC, TRIG, CHOLHDL, LDLDIRECT in the last 72 hours. Thyroid Function Tests: No results for input(s): TSH, T4TOTAL, FREET4, T3FREE, THYROIDAB in the last 72 hours. Anemia Panel: No results for input(s): VITAMINB12, FOLATE, FERRITIN, TIBC, IRON, RETICCTPCT in the last 72 hours. Urine analysis:    Component Value Date/Time   COLORURINE YELLOW 08/16/2020 0945   APPEARANCEUR CLEAR 08/16/2020 0945   LABSPEC 1.015 08/16/2020 0945   PHURINE 5.0 08/16/2020 0945  GLUCOSEU NEGATIVE 08/16/2020 0945   HGBUR MODERATE (A) 08/16/2020 0945   BILIRUBINUR NEGATIVE 08/16/2020 0945   KETONESUR NEGATIVE 08/16/2020 0945   PROTEINUR 30 (A) 08/16/2020 0945   UROBILINOGEN 0.2 12/19/2014 1249   NITRITE NEGATIVE 08/16/2020 0945   LEUKOCYTESUR NEGATIVE 08/16/2020 0945   Sepsis Labs: Recent Results (from the past 240 hour(s))  Resp Panel by RT-PCR (Flu A&B, Covid) Nasopharyngeal Swab     Status: None   Collection Time: 08/14/20 12:08 PM   Specimen: Nasopharyngeal Swab; Nasopharyngeal(NP) swabs in vial transport medium  Result Value Ref Range Status   SARS Coronavirus 2 by RT PCR NEGATIVE NEGATIVE Final    Comment: (NOTE) SARS-CoV-2 target nucleic acids are NOT DETECTED.  The SARS-CoV-2 RNA is generally detectable in upper respiratory specimens during the acute phase of infection. The lowest concentration of SARS-CoV-2 viral copies this assay can detect is 138 copies/mL. A negative result does not preclude SARS-Cov-2 infection and should not be used as the sole basis for treatment or other patient management decisions. A negative result may occur with  improper specimen collection/handling, submission of specimen other than nasopharyngeal swab, presence of viral mutation(s) within the areas targeted by this assay, and inadequate number of viral copies(<138 copies/mL). A negative result  must be combined with clinical observations, patient history, and epidemiological information. The expected result is Negative.  Fact Sheet for Patients:  EntrepreneurPulse.com.au  Fact Sheet for Healthcare Providers:  IncredibleEmployment.be  This test is no t yet approved or cleared by the [place] FDA and  has been authorized for detection and/or diagnosis of SARS-CoV-2 by FDA under an Emergency Use Authorization (EUA). This EUA will remain  in effect (meaning this test can be used) for the duration of the COVID-19 declaration under Section 564(b)(1) of the Act, 21 U.S.C.section 360bbb-3(b)(1), unless the authorization is terminated  or revoked sooner.       Influenza A by PCR NEGATIVE NEGATIVE Final   Influenza B by PCR NEGATIVE NEGATIVE Final    Comment: (NOTE) The Xpert Xpress SARS-CoV-2/FLU/RSV plus assay is intended as an aid in the diagnosis of influenza from Nasopharyngeal swab specimens and should not be used as a sole basis for treatment. Nasal washings and aspirates are unacceptable for Xpert Xpress SARS-CoV-2/FLU/RSV testing.  Fact Sheet for Patients: EntrepreneurPulse.com.au  Fact Sheet for Healthcare Providers: IncredibleEmployment.be  This test is not yet approved or cleared by the [place] FDA and has been authorized for detection and/or diagnosis of SARS-CoV-2 by FDA under an Emergency Use Authorization (EUA). This EUA will remain in effect (meaning this test can be used) for the duration of the COVID-19 declaration under Section 564(b)(1) of the Act, 21 U.S.C. section 360bbb-3(b)(1), unless the authorization is terminated or revoked.  Performed at [facility], [place] [address], [place], [place] [address]   Urine culture     Status: Abnormal   Collection Time: 08/16/20  7:59 AM   Specimen: Urine, Random  Result Value Ref Range Status   Specimen Description URINE,  RANDOM  Final   Special Requests   Final    NONE Performed at [facility], [place] [address] [place], [place] [address]    Culture MULTIPLE SPECIES PRESENT, SUGGEST RECOLLECTION (A)  Final   Report Status 08/17/2020 FINAL  Final  SARS CORONAVIRUS 2 (TAT 6-24 HRS) Nasopharyngeal Nasopharyngeal Swab     Status: None   Collection Time: 08/16/20 10:26 AM   Specimen: Nasopharyngeal Swab  Result Value Ref Range Status  SARS Coronavirus 2 NEGATIVE NEGATIVE Final    Comment: (NOTE) SARS-CoV-2 target nucleic acids are NOT DETECTED.  The SARS-CoV-2 RNA is generally detectable in upper and lower respiratory specimens during the acute phase of infection. Negative results do not preclude SARS-CoV-2 infection, do not rule out co-infections with other pathogens, and should not be used as the sole basis for treatment or other patient management decisions. Negative results must be combined with clinical observations, patient history, and epidemiological information. The expected result is Negative.  Fact Sheet for Patients: SugarRoll.be  Fact Sheet for Healthcare Providers: https://www.woods-mathews.com/  This test is not yet approved or cleared by the [place] FDA and  has been authorized for detection and/or diagnosis of SARS-CoV-2 by FDA under an Emergency Use Authorization (EUA). This EUA will remain  in effect (meaning this test can be used) for the duration of the COVID-19 declaration under Se ction 564(b)(1) of the Act, 21 U.S.C. section 360bbb-3(b)(1), unless the authorization is terminated or revoked sooner.  Performed at [facility], [place] [address] [place], [place] [address]      Radiological Exams on Admission: CT Head Wo Contrast  Result Date: 08/16/2020 CLINICAL DATA:  Delirium. Recent right PCA territory stroke. Recent left frontal and parietooccipital strokes. EXAM: CT HEAD WITHOUT CONTRAST TECHNIQUE: Contiguous  axial images were obtained from the base of the skull through the vertex without intravenous contrast. COMPARISON:  MRI 08/02/2020 FINDINGS: Brain: No acute finding affecting the brainstem or cerebellum. Few old small vessel infarctions. Cerebral hemispheres show subacute infarction in the right PCA territory affecting the posteromedial temporal lobe and occipital lobe. Mild low-density and swelling but no gross hemorrhage. Small acute infarction shown by MRI in the left hemisphere are not specifically visible. No new insult is seen. No mass lesion, hemorrhage, hydrocephalus or extra-axial collection. Vascular: There is atherosclerotic calcification of the major vessels at the base of the brain. Skull: Negative Sinuses/Orbits: Clear/normal Other: None IMPRESSION: Subacute infarction in the right PCA territory affecting the posteromedial temporal lobe and occipital lobe. Mild low-density and swelling but no gross hemorrhage. Small acute infarctions shown by MRI in the left hemisphere are not specifically visible by CT. No new insult is seen. Electronically Signed   By: Nelson Chimes M.D.   On: 08/16/2020 08:11   DG Chest Portable 1 View  Result Date: 08/16/2020 CLINICAL DATA:  Sudden onset left shoulder pain.  No injury. EXAM: PORTABLE CHEST 1 VIEW COMPARISON:  CT chest dated June 22, 2020. Chest x-ray dated February 23, 2020. FINDINGS: Stable cardiomediastinal silhouette. Atherosclerotic calcification of the aortic arch. Normal pulmonary vascularity. New retrocardiac opacity with silhouetting of the left hemidiaphragm. Probable small left pleural effusion. No pneumothorax. No acute osseous abnormality. IMPRESSION: 1. New left lower lobe atelectasis versus infiltrate. Probable small left pleural effusion. Electronically Signed   By: Titus Dubin M.D.   On: 08/16/2020 08:07   DG Shoulder Left  Result Date: 07/20/2020 CLINICAL DATA:  Left lateral shoulder pain EXAM: LEFT SHOULDER - 2+ VIEW COMPARISON:  None.  FINDINGS: Internal rotation, external rotation, and transscapular views of the left shoulder are obtained. No fracture, subluxation, or dislocation. Mild glenohumeral joint osteoarthritis. Prominent spurring on the inferior surface of the acromion process. Calcifications within the rotator cuff tendon consistent with calcific tendinopathy. Left chest is clear. IMPRESSION: 1. Mild degenerative changes of the left shoulder. No acute fracture. 2. Calcific tendinopathy of the rotator cuff. Electronically Signed   By: Randa Ngo M.D.   On: 07/29/2020  17:03    EKG: Independently reviewed.  Atrial fibrillation 100 beats per  Assessment/Plan Comfort measures only status Community-acquired pneumonia Acute renal failure superimposed on chronic kidney stage IIIb Paroxysmal atrial fibrillation on anticoagulation Left shoulder pain History of GI bleed AAA Tobacco abuse DNR Patient initially came in with complaints of shoulder after recent stroke.  Noted to have worsening agitation with prior history of dementia.  After being in the hospital patient noted to be more lethargic, hypothermic, and not responding appropriately.  Patient's end-of-life thought to be coming soon.  Palliative care had been initially consulted and placed recommendations for end-of-life care.  Family states that patient had been drinking more and seemed very restless. -Palliative care order set - Discontinue cardiac monitoring - Routine vital sign checks - Discontinued home medications - N.p.o. except for sips - Okay for RN to pronounce death - Aspiration precautions - Maintain IV access - Morphine drip  - Zofran IV prn nausea/vomiting - Ativan IV prn anxiety - Haloperidol IV prn agitation or delirium - Albuterol prn wheezing   -IV options added at the family's request due to possibility of aspirating and appearing in discomfort with jerking movements.  These can be discontinued if will inhibit patient getting transfered to  a hospice facility -Appreciate palliative care and otherwise continue current recommendations as tolerated     DVT prophylaxis: None  Code Status: DNR Family Communication: Discussed plan of care with the patient's daughter present at both Disposition Plan: Possible transition to inpatient hospice Consults called: Palliative care Admission status: Inpatient  Norval Morton MD [facility]   If 7PM-7AM, please contact night-coverage   08/17/2020, 4:33 PM

## 2020-08-17 NOTE — Consult Note (Signed)
Palliative Care Consultation Note  86 yo with recent stroke and prolonged delirium. He is now holding in the ED at [facility] with deteriorating status. Spoke with patient's 3 daughters and his wife [id number] of care. Family understand the impact of his stroke on his QOL and do not desire aggressive medical interventions. He is not eating. He has been agitated requiring medication for sedation. He is now hypothermic and mostly unresponsive without vigorous stimulation and without medications since earlier this AM. He has minimal to no urine output in the last 24 hours. On my assessment today he appears to be approaching end of life.   I have recommended admission for comfort measures and management of terminal agitation and EOL symptoms. Family agrees to referral to a hospice facility. They are anxious to be with him when that is possible.  I have added comfort meds and palliative PRNs.  Spoke with ED providers and requested admission by hospitalist service for comfort care until we can get him placed into a hospice IPU bed.  Anderson Malta, DO Palliative Medicine  [phone]  Time: 50 min Greater than 50%  of this time was spent counseling and coordinating care related to the above assessment and plan.

## 2020-08-17 NOTE — ED Notes (Signed)
I tried several times to obtain a temp in pt's mouth, but he wouldn't keep it closed and it wouldn't register in the axillary. I didn't want to do a rectal temp, because that would agitate pt and he is calm and cooperative at this time.

## 2020-08-17 NOTE — ED Notes (Signed)
Notified Sponseller, PA of pt's temp. Placed pt on bair hugger.

## 2020-08-17 NOTE — ED Notes (Signed)
RN has had to sit in room with patient the entire second half of shift due to extreme fall risk and patient not redirectable; pt continues to turn self in bed with legs hanging over rail. Medications has been administered but has had no affect on patient; Safety sitter ordered; Patient has swatted at RN several times when attempting to keep him in bed; Bed alarm has been placed so RN can conduct rounds on other patients in unit-Monique,RN

## 2020-08-17 NOTE — ED Notes (Signed)
Attempted to give report and was told the nurse will call me back. 

## 2020-08-17 NOTE — ED Notes (Signed)
Patient continues to be restless and attempts to get out of bed; pt was found standing up in room; Staff put patient back to bed; EDP notified for meds.-Monique,RN

## 2020-08-17 NOTE — Evaluation (Signed)
Physical Therapy Evaluation Patient Details Name: Chris Peterson MRN: [id number] DOB: Jan 06, 1930 Today's Date: 08/17/2020   History of Present Illness  Pt is 86 yo male with PMH including hypertension, hyperlipidemia, atrial fibrillation, pericardial effusion, acute on chronic kidney disease, prior GI bleed.  Pt with recent admission for CVA who was d/c to SNF.  Pt had hospital delirirum while hospitalized and was started on Seroquel but had trouble getting medications at SNF and now returns with increased confusion,.  Clinical Impression  Pt admitted with above diagnosis. Pt presenting with decreased mobility, strength, endurance, safety, and balance.  Pt confused, reaching for items in the air at arrival, and decreased ability to follow commands.  Pt requiring min-mod a for transfers and ambulation. Pt currently with functional limitations due to the deficits listed below (see PT Problem List). Pt will benefit from skilled PT to increase their independence and safety with mobility to allow discharge to the venue listed below.       Follow Up Recommendations SNF    Equipment Recommendations  None recommended by PT    Recommendations for Other Services Rehab consult     Precautions / Restrictions Precautions Precautions: Fall Precaution Comments: HOH      Mobility  Bed Mobility Overal bed mobility: Needs Assistance Bed Mobility: Supine to Sit;Sit to Supine     Supine to sit: Mod assist Sit to supine: Mod assist   General bed mobility comments: increased time and cues    Transfers Overall transfer level: Needs assistance Equipment used: 1 person hand held assist Transfers: Sit to/from Stand Sit to Stand: Mod assist         General transfer comment: Min a to power up and mod a to steady  Ambulation/Gait Ambulation/Gait assistance: Mod assist Gait Distance (Feet): 15 Feet Assistive device: 1 person hand held assist Gait Pattern/deviations: Decreased stride  length;Staggering left;Staggering right;Trunk flexed;Narrow base of support;Scissoring     General Gait Details: Very unsteady requiring mod a for balance; did not have RW in ED  Stairs            Wheelchair Mobility    Modified Rankin (Stroke Patients Only)       Balance Overall balance assessment: Needs assistance Sitting-balance support: Feet supported;No upper extremity supported Sitting balance-Leahy Scale: Poor Sitting balance - Comments: Initial posterior lean with mod A - had pt reach forward for object and was able to improve balance to close supervision Postural control: Posterior lean Standing balance support: Bilateral upper extremity supported Standing balance-Leahy Scale: Poor Standing balance comment: required at least one UE supported                             Pertinent Vitals/Pain Pain Assessment: No/denies pain    Home Living Family/patient expects to be discharged to:: Skilled nursing facility Living Arrangements: Spouse/significant other Available Help at Discharge: Family Type of Home: House Home Access: Stairs to enter Entrance Stairs-Rails: Right Entrance Stairs-Number of Steps: 2 Home Layout: Two level;1/2 bath on main level Home Equipment: Walker - 4 wheels;Cane - quad;Walker - 2 wheels;Bedside commode;Tub bench;Hand held shower head;Wheelchair - manual Additional Comments: Last admissiohjn: Pt's daughter pulled PT aside and reported pt is an alcholic and drinks nightly and stated that the pt even told a MD he would not stop drinking. Pt's daughter willing to consider long-term placement for pt safety.    Prior Function Level of Independence: Independent with assistive device(s)  Comments: Daughters and wife do cleaning and cooking, but pt otherwise mod I with use of quad-cane for all mobility. Pt does not provide physical assistance to wife who is 81 and has also had previous strokes. Note home environment and PLOF  obtained from recent admission.  Pt unable to provide.  He was admitted 1 week ago for CVA and went to SNF at d/c.     Hand Dominance        Extremity/Trunk Assessment        Lower Extremity Assessment Lower Extremity Assessment: Overall WFL for tasks assessed    Cervical / Trunk Assessment Cervical / Trunk Assessment: Kyphotic  Communication   Communication: HOH  Cognition Arousal/Alertness: Awake/alert Behavior During Therapy: WFL for tasks assessed/performed Overall Cognitive Status: Impaired/Different from baseline Area of Impairment: Orientation;Awareness;Attention;Following commands;Safety/judgement;Problem solving                 Orientation Level: Disoriented to;Time;Situation Current Attention Level: Sustained Memory: Decreased short-term memory Following Commands: Follows one step commands consistently Safety/Judgement: Decreased awareness of deficits;Decreased awareness of safety Awareness: Emergent Problem Solving: Slow processing;Requires verbal cues;Decreased initiation;Difficulty sequencing        General Comments      Exercises     Assessment/Plan    PT Assessment Patient needs continued PT services  PT Problem List Decreased strength;Decreased activity tolerance;Decreased balance;Decreased mobility;Decreased cognition;Decreased coordination;Decreased knowledge of use of DME;Decreased safety awareness;Decreased knowledge of precautions;Cardiopulmonary status limiting activity       PT Treatment Interventions DME instruction;Gait training;Stair training;Functional mobility training;Therapeutic exercise;Therapeutic activities;Balance training;Neuromuscular re-education;Cognitive remediation;Patient/family education    PT Goals (Current goals can be found in the Care Plan section)  Acute Rehab PT Goals Patient Stated Goal: unable PT Goal Formulation: With patient/family Time For Goal Achievement: 08/31/20 Potential to Achieve Goals: Fair     Frequency Min 2X/week   Barriers to discharge        Co-evaluation               AM-PAC PT "6 Clicks" Mobility  Outcome Measure Help needed turning from your back to your side while in a flat bed without using bedrails?: A Little Help needed moving from lying on your back to sitting on the side of a flat bed without using bedrails?: A Lot Help needed moving to and from a bed to a chair (including a wheelchair)?: A Lot Help needed standing up from a chair using your arms (e.g., wheelchair or bedside chair)?: A Lot Help needed to walk in hospital room?: A Lot Help needed climbing 3-5 steps with a railing? : A Lot 6 Click Score: 13    End of Session Equipment Utilized During Treatment: Gait belt Activity Tolerance: Patient tolerated treatment well Patient left: in bed;with call bell/phone within reach;with nursing/sitter in room Nurse Communication: Mobility status PT Visit Diagnosis: Unsteadiness on feet (R26.81)    Time: 1125-1150 PT Time Calculation (min) (ACUTE ONLY): 25 min   Charges:   PT Evaluation $PT Eval Low Complexity: 1 Low PT Treatments $Therapeutic Activity: 8-22 mins        Abran Richard, PT Acute Rehab Services Pager [phone] [facility] [phone]    Karlton Lemon 08/17/2020, 12:05 PM

## 2020-08-17 NOTE — ED Notes (Signed)
Provided pt w/[employer] breakfast that daughter brought. I cut up everything for pt and put it in front of him to eat.

## 2020-08-17 NOTE — ED Notes (Signed)
Checked pt's brief and it is dry and clean

## 2020-08-17 NOTE — ED Notes (Signed)
Cleaned pt's perineal area and applied a clean brief.

## 2020-08-17 NOTE — ED Notes (Signed)
ED Provider at bedside. 

## 2020-08-17 NOTE — ED Provider Notes (Signed)
Emergency Medicine Observation Re-evaluation Note  Chris Peterson is a 86 y.o. male, seen on rounds today.  Pt initially presented to the ED for complaints of Shoulder Pain Currently, the patient is a bit restless in bed and wiggling around.  He is reaching at the light that has a picture on it.  He does acknowledge I am present but not speaking coherently.  Physical Exam  BP 136/88   Pulse 98   Temp 97.9 F (36.6 C) (Oral)   Resp 18   SpO2 96%  Physical Exam General: slightly agitated Cardiac: regular rate Lungs: clear, no resp distress Psych: demented with possible delerium.  Moving all ext without difficulty and follows minimal commands. Skin: several areas of ecchymosis over the lower ext and small skin tear on the right calf from throwing his legs over the bed  ED Course / MDM  EKG:EKG Interpretation  Date/Time:  Wednesday August 16 2020 07:34:20 EST Ventricular Rate:  100 PR Interval:    QRS Duration: 142 QT Interval:  383 QTC Calculation: 494 R Axis:   32 Text Interpretation: Atrial fibrillation IVCD, consider atypical RBBB Since prior ECG< no significant change Confirmed by Alvira Monday ([id number]) on 08/16/2020 8:20:08 AM    I have reviewed the labs performed to date as well as medications administered while in observation.  Recent changes in the last 24 hours include became violent and agitated overnight.  Plan  Current plan is for PT eval for SNF placement.    Gwyneth Sprout, MD 08/17/20 [phone]

## 2020-08-18 DIAGNOSIS — Z515 Encounter for palliative care: Principal | ICD-10-CM

## 2020-08-18 NOTE — Progress Notes (Signed)
[facility] 5A[facility]) Hospice Liaison RN note:  Received request from Dr. Phillips Odor to call family to discuss home with hospice vs inpatient unit services. Heather Wile, TOC, aware.  Called wife Dwaine Pringle, no answer. Valla Leaver, daughter, who three way connected in her sister, Kriste Basque. Marylu Lund and [place] request that we speak with their mother tomorrow as she is home sleeping now and needs some time to process hospice recommendations made today. Family requested a meeting at 1 pm tomorrow in room with daughter's who can be present and will have mother on the phone for discussion. I informed them that I could not commit my colleague to a meeting time (as I will not be covering tomorrow) but would pass along the request and have my colleague reach out in the morning to schedule a mutually agreeable time for meeting. Heather, TOC, updated.  Please call with any hospice related questions or concerns.  Thanks  Thank you, Haynes Bast, BSN, RN [facility] Liaison [phone]

## 2020-08-18 NOTE — Progress Notes (Signed)
Patient admitted to [facility] room 4 from the ED. No verbal responses at present. Eyes closed. Transferred to bed. Call bell in reach. Resting comfortably.

## 2020-08-18 NOTE — Progress Notes (Signed)
Palliative Care Progress Note  Patient is much more comfortable and quite lucid this AM. He is on Morphine infusion at 1mg  and scheduled Ativan q6 hours and tolerating this very well.His prognosis remains poor however. He is not really interested in food but is this AM taking sips of water. Goals are comfort and QOL. CSW initiated referral to a hospice IPU yesterday from the ED-I discussed with family today the possibility of actually taking him home with hospice care services coming to them. I will contact the hospice liaison and have them also discuss this option especially now that he appears to have better management of his symptoms- will see how he progresses through the day especially towards the evening in terms of agitation etc. I spoke with his daughters and wife today during my visit. Family are discussing these options and I will check in with them tomorrow re: hospice IPU vs. Hospice at Home and also assess his needs for symptom management today. They expressed gratitude for the care he is receiving and that he is comfortable now.  , DO Palliative Medicine  Time: 35 min Greater than 50%  of this time was spent counseling and coordinating care related to the above assessment and plan.

## 2020-08-18 NOTE — Progress Notes (Signed)
   08/18/20 1345  Clinical Encounter Type  Visited With Patient  Visit Type Initial  Referral From Palliative care team  Consult/Referral To Chaplain   Chaplain responded to Palliative Spiritual Care request. No family was at bedside. Pt denied any spiritual/emotional needs at the moment. Pt stated he was comfortable and doing better than previously, except for a dry mouth at times, which this Chaplain witnessed his nurse, Ashli, attending to, and feeling weak. Palliative Care Chaplain will follow-up as needed.  This note was prepared by Chaplain Resident, Tacy Learn, MDiv. For questions, please contact by phone at [phone].

## 2020-08-18 NOTE — TOC Initial Note (Signed)
Transition of Care [facility]) - Initial/Assessment Note    Patient Details  Name: Chris Peterson MRN: [id number] Date of Birth: 1929-08-22  Transition of Care [facility]) CM/SW Contact:    Kingsley Plan, RN Phone Number: 08/18/2020, 12:16 PM  Clinical Narrative:                  Family has discussed with Dr Phillips Odor , regarding [facility] vs home with hospice. Dr Phillips Odor has called French Ana with [facility].   Will continue to follow.        Patient Goals and CMS Choice     Choice offered to / list presented to : Patient,Spouse,Adult Children  Expected Discharge Plan and Services     Discharge Planning Services: CM Consult   Living arrangements for the past 2 months: Single Family Home                                      Prior Living Arrangements/Services Living arrangements for the past 2 months: Single Family Home Lives with:: Spouse Patient language and need for interpreter reviewed:: Yes        Need for Family Participation in Patient Care: Yes (Comment) Care giver support system in place?: Yes (comment)   Criminal Activity/Legal Involvement Pertinent to Current Situation/Hospitalization: No - Comment as needed  Activities of Daily Living      Permission Sought/Granted   Permission granted to share information with : Yes, Verbal Permission Granted  Share Information with NAME: Kathie Rhodes wife , Marylu Lund daughter  Permission granted to share info w AGENCY: [facility]        Emotional Assessment              Admission diagnosis:  Sundowning [F05] Comfort measures only status [Z51.5] Acute pain of left shoulder [M25.512] Recent cerebrovascular accident (CVA) [Z86.73] Community acquired pneumonia of left lower lobe of lung [J18.9] Patient Active Problem List   Diagnosis Date Noted  . Comfort measures only status 08/17/2020  . DNR (do not resuscitate) 08/17/2020  . HLD (hyperlipidemia) 08/08/2020  . Acute renal failure superimposed on  stage 3b chronic kidney disease (HCC) 08/08/2020  . History of GI bleed 08/08/2020  . Sundowning 08/08/2020  . PAF (paroxysmal atrial fibrillation) (HCC)   . AAA (abdominal aortic aneurysm) without rupture (HCC)   . Essential hypertension   . Alcohol abuse   . CVA (cerebral vascular accident) (HCC) 08/02/2020  . Pericarditis 02/23/2020  . Postop check 12/22/2019  . Diastolic heart failure (HCC) 12/01/2019  . GI bleed 03/13/2018  . Anemia associated with acute blood loss 03/13/2018  . Melena   . Acute gastric ulcer with hemorrhage   . Pericardial effusion   . Atherosclerotic renal artery stenosis, bilateral (HCC)   . HTN (hypertension) 11/28/2011  . AAA (abdominal aortic aneurysm) (HCC) 11/28/2011  . CKD (chronic kidney disease) 11/28/2011  . Diabetes mellitus, type II (HCC) 11/28/2011  . Tobacco abuse 11/28/2011   PCP:  Daisy Floro, MD Pharmacy:   [phone] - [address] [place] - [address] [place] [address] Phone: [phone] Fax: [phone]     Social Determinants of Health (SDOH) Interventions    Readmission Risk Interventions No flowsheet data found.

## 2020-08-18 NOTE — Progress Notes (Signed)
PROGRESS NOTE  Chris Peterson [id number] DOB: August 26, 1929 DOA: 07/22/2020 PCP: Chris Cruel, MD  Brief History    Chris Peterson is a 86 y.o. male with medical history significant of dementia, hypertension, hyperlipidemia, atrial fibrillation on Eliquis, CVA, AAA, pericardial effusion, kidney disease, and prior history of GI bleed presented from the facility on 12/28 with complaints of left shoulder pain and increasing confusion.  Patient just recently been hospitalized from 12/15-12/27 after being found to have a moderate sized right PCA territory infarct involving the temporal occipital lobes due to occlusion of the right P2 PCA.  Also noted to have acute cortical infarcts involving the left frontal lobe and left parietal occipital lobes.  During his hospitalization he was noted to have sundowning and has been started on Seroquel.  At discharge he had been sent to the rehab facility, but had not work with physical therapy over the weekend as it was Christmas.  Family had felt that the patient had gotten weaker during this time.  He claims pain in the left shoulder with was worse with it.  Also reported complaints of cough for last 1 month and chest pain.  ED Course: While in the emergency department from 12/28 patient was noted to have a increasing confusion for which acute delirium was suspected.  Chest x-ray was done due to complaints of cough and revealed concern for atelectasis versus infiltrate.  Patient was started on p.o. antibiotics doxycycline and Keflex for treatment of the possible pneumonia.  He was also evaluated by Dr. Cheral Marker of neurology due to reports of weakness, but this was suspected secondary to the recent stroke.  Patient did not meet inpatient criteria at that time.  Social work has been consulted to possibly transfer patient to a different facility. COVID-19 screening was negative and patient had been placed in isolation in which family was unable to visit while  awaiting placement.  Overnight on 12/29 patient was noted to be significantly agitated and needed multiple medications for sedation.    Palliative care had been consulted and had discussions with the patient's 3 daughters in regards to goals of care.  He had been found to be hypothermic with temperature of 94 F with decreased urine output for which it was suspected that the patient was approaching end-of-life.  Patient's daughters had agreed to admission for comfort measures only and management of agitation and end-of-life symptoms.  They had not been able to see him over the last day due to isolation protocols and attempts to try and send him to another nursing facility and were anxious to be with him.  [facility] called to admit.  Family is concerned with the patient jerking and seeming more restless and the possibility of him aspirating.  [facility] were consulted to admit the patient for comfort care pending placement being found in residential hospice. However, today palliative care is raising the possibility of discharging the patient to home with hospice as the patient's pain is now under control.  Consultants  . Palliative care  Procedures  . None  Antibiotics   Anti-infectives (From admission, onward)   Start     Dose/Rate Route Frequency Ordered Stop   08/16/20 0945  doxycycline (VIBRA-TABS) tablet 100 mg  Status:  Discontinued        100 mg Oral Every 12 hours 08/16/20 0936 08/17/20 1442   08/16/20 0945  cephALEXin (KEFLEX) capsule 500 mg  Status:  Discontinued        500 mg Oral Every  12 hours 08/16/20 0936 08/17/20 1442    .  Subjective  The patient is somnolent, but rouseable. No new complaints.  Objective   Vitals:  Vitals:   08/17/20 1642 08/18/20 0630  BP: 122/76 (!) 146/93  Pulse: 65 95  Resp: 18 18  Temp: 98 F (36.7 C) (!) 97.5 F (36.4 C)  SpO2: 98% 94%   Exam:  Constitutional:  The patient is resting quietly. No acute distress. Respiratory:  . No  increased work of breathing. . No wheezes, rales, or rhonchi . No tactile fremitus Cardiovascular:  . Regular rate and rhythm . No murmurs, ectopy, or gallups. . No lateral PMI. No thrills. Abdomen:  . Abdomen is soft, non-tender, non-distended . No hernias, masses, or organomegaly . Normoactive bowel sounds.  Musculoskeletal:  . No cyanosis, clubbing, or edema Skin:  . No rashes, lesions, ulcers . palpation of skin: no induration or nodules Neurologic:  . Unable to evaluate due to the patient's inability to cooperate with exam. Psychiatric:  . Unable to evaluate due to the patient's inability to cooperate with exam. I have personally reviewed the following:   Today's Data  . Vitals  Scheduled Meds: . LORazepam  1 mg Oral Q6H  . QUEtiapine  12.5 mg Oral BID  . QUEtiapine  50 mg Oral QHS   Continuous Infusions: . morphine 1 mg/hr (08/18/20 0646)    Principal Problem:   Comfort measures only status Active Problems:   HTN (hypertension)   Diabetes mellitus, type II (HCC)   CVA (cerebral vascular accident) (HCC)   Sundowning   DNR (do not resuscitate)   LOS: 1 day   A & P  Community acquired pneumonia: Comfort care only.  Acute renal failure on CKD IIIb: Comfort care only  Paroxysmal atrial fibrillation: Comfort care only.  Left shoulder pain: Comfort care only.  History of GI Bleed: comfort care only  AAA: Comfort care only.  Tobacco abuse: Comfort care only.  I have seen and examined this patient myself. I have spent 30 minutes in his evaluation and care.  DVT prophylaxis: Comfort care Code Status: DNR Family communication: Wife is at bedside Disposition: Residential hospice vs home with hospice.  Zadin Lange, DO [facility] Direct contact: see [email or web address]  7PM-7AM contact night coverage as above 08/18/2020, 5:36 PM  LOS: 1 day

## 2020-08-19 DEATH — deceased

## 2020-09-19 NOTE — Progress Notes (Signed)
Wasted remaining 44mL of Morphine drip with Merrilyn Puma, RN as witness.

## 2020-09-19 NOTE — Discharge Summary (Signed)
Physician Death Summary  Chris Peterson [id number] DOB: 1930/02/08 DOA: Sep 06, 2020  PCP: Lawerance Cruel, MD  Admit date: 09/06/2020 Date and time of Death: 8:49 09-10-20  Discharge Diagnoses: Principal diagnosis is #1 1. Community acquired pneumonia 2. Acute renal failure on CKD IIIb 3. Paroxysmal atrial fibrillation 4. Left shoulder pain 5. History of GI Bleed 6. AAA 7. Tobacco abuse  Discharge Condition: Deceased  History of present illness: Chris Peterson is a 87 y.o. male with medical history significant of dementia, hypertension, hyperlipidemia, atrial fibrillation on Eliquis, CVA, AAA, pericardial effusion, kidney disease, and prior history of GI bleed presented from the facility on 07-Sep-2023 with complaints of left shoulder pain and increasing confusion.  Patient just recently been hospitalized from 12/15-12/27 after being found to have a moderate sized right PCA territory infarct involving the temporal occipital lobes due to occlusion of the right P2 PCA.  Also noted to have acute cortical infarcts involving the left frontal lobe and left parietal occipital lobes.  During his hospitalization he was noted to have sundowning and has been started on Seroquel.  At discharge he had been sent to the rehab facility, but had not work with physical therapy over the weekend as it was Christmas.  Family had felt that the patient had gotten weaker during this time.  He claims pain in the left shoulder with was worse with it.  Also reported complaints of cough for last 1 month and chest pain.  ED Course: While in the emergency department from 09-07-2023 patient was noted to have a increasing confusion for which acute delirium was suspected.  Chest x-ray was done due to complaints of cough and revealed concern for atelectasis versus infiltrate.  Patient was started on p.o. antibiotics doxycycline and Keflex for treatment of the possible pneumonia.  He was also evaluated by Dr. Cheral Marker of neurology  due to reports of weakness, but this was suspected secondary to the recent stroke.  Patient did not meet inpatient criteria at that time.  Social work has been consulted to possibly transfer patient to a different facility. COVID-19 screening was negative and patient had been placed in isolation in which family was unable to visit while awaiting placement.  Overnight on 12/29 patient was noted to be significantly agitated and needed multiple medications for sedation.    Palliative care had been consulted and had discussions with the patient's 3 daughters in regards to goals of care.  He had been found to be hypothermic with temperature of 94 F with decreased urine output for which it was suspected that the patient was approaching end-of-life.  Patient's daughters had agreed to admission for comfort measures only and management of agitation and end-of-life symptoms.  They had not been able to see him over the last day due to isolation protocols and attempts to try and send him to another nursing facility and were anxious to be with him.  [facility] called to admit.  Family is concerned with the patient jerking and seeming more restless and the possibility of him aspirating.  Hospital Course: [facility] hospitalists were consulted to admit the patient for comfort care pending placement being found in residential hospice. However, today palliative care is raising the possibility of discharging the patient to home with hospice as the patient's pain is now under control. However, the patient passed on the morning of 2020-09-09.   Today's assessment: For last physical exam before death, please see progress note dated 2020-09-09. O: Vitals:  Vitals:   09-Sep-2020 0630  August 26, 2020 0629  BP: (!) 146/93 (!) 82/54  Pulse: 95 92  Resp: 18 17  Temp: (!) 97.5 F (36.4 C) (!) 97.5 F (36.4 C)  SpO2: 94% (!) 75%     The results of significant diagnostics from this hospitalization (including imaging, microbiology, ancillary and  laboratory) are listed below for reference.    Significant Diagnostic Studies: CT Head Wo Contrast  Result Date: 08/16/2020 CLINICAL DATA:  Delirium. Recent right PCA territory stroke. Recent left frontal and parietooccipital strokes. EXAM: CT HEAD WITHOUT CONTRAST TECHNIQUE: Contiguous axial images were obtained from the base of the skull through the vertex without intravenous contrast. COMPARISON:  MRI 08/02/2020 FINDINGS: Brain: No acute finding affecting the brainstem or cerebellum. Few old small vessel infarctions. Cerebral hemispheres show subacute infarction in the right PCA territory affecting the posteromedial temporal lobe and occipital lobe. Mild low-density and swelling but no gross hemorrhage. Small acute infarction shown by MRI in the left hemisphere are not specifically visible. No new insult is seen. No mass lesion, hemorrhage, hydrocephalus or extra-axial collection. Vascular: There is atherosclerotic calcification of the major vessels at the base of the brain. Skull: Negative Sinuses/Orbits: Clear/normal Other: None IMPRESSION: Subacute infarction in the right PCA territory affecting the posteromedial temporal lobe and occipital lobe. Mild low-density and swelling but no gross hemorrhage. Small acute infarctions shown by MRI in the left hemisphere are not specifically visible by CT. No new insult is seen. Electronically Signed   By: Nelson Chimes M.D.   On: 08/16/2020 08:11   CT HEAD WO CONTRAST  Addendum Date: 08/02/2020   ADDENDUM REPORT: 08/02/2020 15:56 ADDENDUM: On further review, decreased attenuation is noted in the medial right temporal-parietal junction region concerning for acute infarct in this area. Electronically Signed   By: Lowella Grip III M.D.   On: 08/02/2020 15:56   Result Date: 08/02/2020 CLINICAL DATA:  Global amnesia with altered mental status EXAM: CT HEAD WITHOUT CONTRAST TECHNIQUE: Contiguous axial images were obtained from the base of the skull through  the vertex without intravenous contrast. COMPARISON:  Brain MRI June 27, 2019 FINDINGS: Brain: There is age related volume loss. There is no intracranial mass, hemorrhage, extra-axial fluid collection, or midline shift. There is mild small vessel disease in the centra semiovale bilaterally. No acute infarct appreciable. Vascular: No hyperdense vessel. Foci of calcification in the carotid siphon regions noted. Skull: The bony calvarium appears intact. Sinuses/Orbits: There is mucosal thickening in the inferior left maxillary antrum anteriorly as well as mucosal thickening in several ethmoid air cells. Orbits appear symmetric bilaterally. Other: Mastoid air cells are clear. IMPRESSION: Age related volume loss with mild stable periventricular small vessel disease. No acute infarct. No mass or hemorrhage. There are foci of arterial vascular calcification. There are foci of paranasal sinus disease. Electronically Signed: By: Lowella Grip III M.D. On: 08/02/2020 13:07   MR ANGIO HEAD WO CONTRAST  Result Date: 08/02/2020 CLINICAL DATA:  Stroke EXAM: MRI HEAD WITHOUT CONTRAST MRA HEAD WITHOUT CONTRAST MRA NECK WITHOUT AND WITH CONTRAST TECHNIQUE: Multiplanar, multiecho pulse sequences of the brain and surrounding structures were obtained without intravenous contrast. Angiographic images of the Circle of Willis were obtained using MRA technique without intravenous contrast. Angiographic images of the neck were obtained using MRA technique without and with intravenous contrast. Carotid stenosis measurements (when applicable) are obtained utilizing NASCET criteria, using the distal internal carotid diameter as the denominator. CONTRAST:  55mL GADAVIST GADOBUTROL 1 MMOL/ML IV SOLN COMPARISON:  Correlation made with same day  CT FINDINGS: MRI HEAD Brain: There is restricted diffusion involving the medial right temporal lobe with extension into the occipital lobe. Additional small foci of cortical restricted diffusion  contralaterally at the left parietooccipital junction, left frontal lobe, and left cerebellum. Small focus of susceptibility in the right cerebellum is most consistent with chronic microhemorrhage. Additional patchy T2 hyperintensity in the supratentorial white matter is nonspecific but probably reflects mild chronic microvascular ischemic changes. Prominence of the ventricles and sulci reflects generalized parenchymal volume loss. There is no intracranial mass or significant mass effect. There is no hydrocephalus or extra-axial fluid collection. Vascular: Susceptibility along the right perimesencephalic cistern likely reflects thrombosis within the right P2 PCA. Major vessel flow voids at the skull base are preserved. Skull and upper cervical spine: Normal marrow signal is preserved. Sinuses/Orbits: Mild mucosal thickening. No significant orbital abnormality. Other: Sella is unremarkable. Mild patchy mastoid fluid opacification. MRA HEAD Intracranial internal carotid arteries are patent. Middle and anterior cerebral arteries are patent. Atherosclerotic irregularity of distal left MCA branches. Included intracranial vertebral arteries are patent. Patent PICA origins. Basilar artery is patent. There is diminished flow within the right P1 PCA. Possible diminutive right posterior communicating artery. Occlusion of the right P2 PCA. Left posterior cerebral artery is patent. MRA NECK Common, internal, and external carotid arteries are patent. There is plaque at the ICA origins bilaterally causing minimal stenosis. Extracranial vertebral arteries are patent. Left vertebral artery is slightly dominant. No measurable stenosis or evidence of dissection. IMPRESSION: Moderate size right PCA territory acute infarction involving temporo-occipital lobes. Occlusion of the right P2 PCA. Small acute cortical infarcts of the left frontal lobe and left parieto-occipital junction. Atherosclerotic irregularity of distal left MCA  branches. Atherosclerotic plaque at the ICA origins with minimal stenosis. Electronically Signed   By: Macy Mis M.D.   On: 08/02/2020 15:53   MR ANGIO NECK W WO CONTRAST  Result Date: 08/02/2020 CLINICAL DATA:  Stroke EXAM: MRI HEAD WITHOUT CONTRAST MRA HEAD WITHOUT CONTRAST MRA NECK WITHOUT AND WITH CONTRAST TECHNIQUE: Multiplanar, multiecho pulse sequences of the brain and surrounding structures were obtained without intravenous contrast. Angiographic images of the Circle of Willis were obtained using MRA technique without intravenous contrast. Angiographic images of the neck were obtained using MRA technique without and with intravenous contrast. Carotid stenosis measurements (when applicable) are obtained utilizing NASCET criteria, using the distal internal carotid diameter as the denominator. CONTRAST:  22mL GADAVIST GADOBUTROL 1 MMOL/ML IV SOLN COMPARISON:  Correlation made with same day CT FINDINGS: MRI HEAD Brain: There is restricted diffusion involving the medial right temporal lobe with extension into the occipital lobe. Additional small foci of cortical restricted diffusion contralaterally at the left parietooccipital junction, left frontal lobe, and left cerebellum. Small focus of susceptibility in the right cerebellum is most consistent with chronic microhemorrhage. Additional patchy T2 hyperintensity in the supratentorial white matter is nonspecific but probably reflects mild chronic microvascular ischemic changes. Prominence of the ventricles and sulci reflects generalized parenchymal volume loss. There is no intracranial mass or significant mass effect. There is no hydrocephalus or extra-axial fluid collection. Vascular: Susceptibility along the right perimesencephalic cistern likely reflects thrombosis within the right P2 PCA. Major vessel flow voids at the skull base are preserved. Skull and upper cervical spine: Normal marrow signal is preserved. Sinuses/Orbits: Mild mucosal thickening.  No significant orbital abnormality. Other: Sella is unremarkable. Mild patchy mastoid fluid opacification. MRA HEAD Intracranial internal carotid arteries are patent. Middle and anterior cerebral arteries are patent. Atherosclerotic irregularity of  distal left MCA branches. Included intracranial vertebral arteries are patent. Patent PICA origins. Basilar artery is patent. There is diminished flow within the right P1 PCA. Possible diminutive right posterior communicating artery. Occlusion of the right P2 PCA. Left posterior cerebral artery is patent. MRA NECK Common, internal, and external carotid arteries are patent. There is plaque at the ICA origins bilaterally causing minimal stenosis. Extracranial vertebral arteries are patent. Left vertebral artery is slightly dominant. No measurable stenosis or evidence of dissection. IMPRESSION: Moderate size right PCA territory acute infarction involving temporo-occipital lobes. Occlusion of the right P2 PCA. Small acute cortical infarcts of the left frontal lobe and left parieto-occipital junction. Atherosclerotic irregularity of distal left MCA branches. Atherosclerotic plaque at the ICA origins with minimal stenosis. Electronically Signed   By: Macy Mis M.D.   On: 08/02/2020 15:53   MR BRAIN WO CONTRAST  Result Date: 08/02/2020 CLINICAL DATA:  Stroke EXAM: MRI HEAD WITHOUT CONTRAST MRA HEAD WITHOUT CONTRAST MRA NECK WITHOUT AND WITH CONTRAST TECHNIQUE: Multiplanar, multiecho pulse sequences of the brain and surrounding structures were obtained without intravenous contrast. Angiographic images of the Circle of Willis were obtained using MRA technique without intravenous contrast. Angiographic images of the neck were obtained using MRA technique without and with intravenous contrast. Carotid stenosis measurements (when applicable) are obtained utilizing NASCET criteria, using the distal internal carotid diameter as the denominator. CONTRAST:  87mL GADAVIST  GADOBUTROL 1 MMOL/ML IV SOLN COMPARISON:  Correlation made with same day CT FINDINGS: MRI HEAD Brain: There is restricted diffusion involving the medial right temporal lobe with extension into the occipital lobe. Additional small foci of cortical restricted diffusion contralaterally at the left parietooccipital junction, left frontal lobe, and left cerebellum. Small focus of susceptibility in the right cerebellum is most consistent with chronic microhemorrhage. Additional patchy T2 hyperintensity in the supratentorial white matter is nonspecific but probably reflects mild chronic microvascular ischemic changes. Prominence of the ventricles and sulci reflects generalized parenchymal volume loss. There is no intracranial mass or significant mass effect. There is no hydrocephalus or extra-axial fluid collection. Vascular: Susceptibility along the right perimesencephalic cistern likely reflects thrombosis within the right P2 PCA. Major vessel flow voids at the skull base are preserved. Skull and upper cervical spine: Normal marrow signal is preserved. Sinuses/Orbits: Mild mucosal thickening. No significant orbital abnormality. Other: Sella is unremarkable. Mild patchy mastoid fluid opacification. MRA HEAD Intracranial internal carotid arteries are patent. Middle and anterior cerebral arteries are patent. Atherosclerotic irregularity of distal left MCA branches. Included intracranial vertebral arteries are patent. Patent PICA origins. Basilar artery is patent. There is diminished flow within the right P1 PCA. Possible diminutive right posterior communicating artery. Occlusion of the right P2 PCA. Left posterior cerebral artery is patent. MRA NECK Common, internal, and external carotid arteries are patent. There is plaque at the ICA origins bilaterally causing minimal stenosis. Extracranial vertebral arteries are patent. Left vertebral artery is slightly dominant. No measurable stenosis or evidence of dissection.  IMPRESSION: Moderate size right PCA territory acute infarction involving temporo-occipital lobes. Occlusion of the right P2 PCA. Small acute cortical infarcts of the left frontal lobe and left parieto-occipital junction. Atherosclerotic irregularity of distal left MCA branches. Atherosclerotic plaque at the ICA origins with minimal stenosis. Electronically Signed   By: Macy Mis M.D.   On: 08/02/2020 15:53   US RENAL  Result Date: 08/05/2020 CLINICAL DATA:  Acute renal insufficiency EXAM: RENAL / URINARY TRACT ULTRASOUND COMPLETE COMPARISON:  None. FINDINGS: Right Kidney: Renal measurements: 10.2 x  4.7 x 4.8 cm = volume: 119.7 mL. Increased cortical echogenicity. No suspicious mass, stone, or hydronephrosis. Left Kidney: Renal measurements: 11.1 x 5.9 x 6.1 cm = volume: 210.4 mL. Increased cortical echogenicity. No suspicious mass, stone, or hydronephrosis. Bladder: Appears normal for degree of bladder distention. Other: None. IMPRESSION: 1. Increased cortical echogenicity bilaterally consistent with medical renal disease. No other abnormalities. Electronically Signed   By: Gerome Sam III M.D   On: 08/05/2020 16:34   DG Chest Portable 1 View  Result Date: 08/16/2020 CLINICAL DATA:  Sudden onset left shoulder pain.  No injury. EXAM: PORTABLE CHEST 1 VIEW COMPARISON:  CT chest dated June 22, 2020. Chest x-ray dated February 23, 2020. FINDINGS: Stable cardiomediastinal silhouette. Atherosclerotic calcification of the aortic arch. Normal pulmonary vascularity. New retrocardiac opacity with silhouetting of the left hemidiaphragm. Probable small left pleural effusion. No pneumothorax. No acute osseous abnormality. IMPRESSION: 1. New left lower lobe atelectasis versus infiltrate. Probable small left pleural effusion. Electronically Signed   By: Obie Dredge M.D.   On: 08/16/2020 08:07   DG Shoulder Left  Result Date: 08-18-2020 CLINICAL DATA:  Left lateral shoulder pain EXAM: LEFT SHOULDER - 2+  VIEW COMPARISON:  None. FINDINGS: Internal rotation, external rotation, and transscapular views of the left shoulder are obtained. No fracture, subluxation, or dislocation. Mild glenohumeral joint osteoarthritis. Prominent spurring on the inferior surface of the acromion process. Calcifications within the rotator cuff tendon consistent with calcific tendinopathy. Left chest is clear. IMPRESSION: 1. Mild degenerative changes of the left shoulder. No acute fracture. 2. Calcific tendinopathy of the rotator cuff. Electronically Signed   By: Sharlet Salina M.D.   On: 2020-08-18 17:03   ECHOCARDIOGRAM COMPLETE  Result Date: 08/03/2020    ECHOCARDIOGRAM REPORT   Patient Name:   HIGINIO LENE Date of Exam: 08/03/2020 Medical Rec #:  [id number]        Height:       69.0 in Accession #:    [id number]       Weight:       135.0 lb Date of Birth:  October 02, 1929        BSA:          1.748 m Patient Age:    89 years         BP:           169/96 mmHg Patient Gender: M                HR:           94 bpm. Exam Location:  Inpatient Procedure: 2D Echo, Cardiac Doppler and Color Doppler Indications:    TIA 435.9 / G45.9  History:        Patient has prior history of Echocardiogram examinations, most                 recent 05/02/2020. Arrythmias:Atrial Fibrillation,                 Signs/Symptoms:Dyspnea; Risk Factors:Hypertension and                 Dyslipidemia. Abdominal Aortic Aneurysm. CKD. Pericardial                 effusion.  Sonographer:    Elmarie Shiley Dance Referring Phys: [id number] Emeline General IMPRESSIONS  1. Left ventricular ejection fraction, by estimation, is >75%. The left ventricle has hyperdynamic function. The left ventricle has no regional wall motion abnormalities. There is mild left ventricular hypertrophy. Left  ventricular diastolic function could not be evaluated.  2. Right ventricular systolic function is normal. The right ventricular size is normal. Tricuspid regurgitation signal is inadequate for assessing PA  pressure.  3. Left atrial size was severely dilated.  4. Right atrial size was mildly dilated.  5. The mitral valve is normal in structure. Mild mitral valve regurgitation. No evidence of mitral stenosis.  6. The aortic valve is calcified. Aortic valve regurgitation is mild. Mild to moderate aortic valve sclerosis/calcification is present, without any evidence of aortic stenosis.  7. The inferior vena cava is normal in size with greater than 50% respiratory variability, suggesting right atrial pressure of 3 mmHg. FINDINGS  Left Ventricle: Left ventricular ejection fraction, by estimation, is >75%. The left ventricle has hyperdynamic function. The left ventricle has no regional wall motion abnormalities. The left ventricular internal cavity size was normal in size. There is mild left ventricular hypertrophy. Left ventricular diastolic function could not be evaluated due to atrial fibrillation. Left ventricular diastolic function could not be evaluated. Right Ventricle: The right ventricular size is normal.Right ventricular systolic function is normal. Tricuspid regurgitation signal is inadequate for assessing PA pressure. The tricuspid regurgitant velocity is 2.38 m/s, and with an assumed right atrial pressure of 3 mmHg, the estimated right ventricular systolic pressure is [id number] mmHg. Left Atrium: Left atrial size was severely dilated. Right Atrium: Right atrial size was mildly dilated. Pericardium: Trivial pericardial effusion is present. Mitral Valve: The mitral valve is normal in structure. Mild mitral annular calcification. Mild mitral valve regurgitation. No evidence of mitral valve stenosis. Tricuspid Valve: The tricuspid valve is normal in structure. Tricuspid valve regurgitation is mild . No evidence of tricuspid stenosis. Aortic Valve: The aortic valve is calcified. Aortic valve regurgitation is mild. Aortic regurgitation PHT measures 715 msec. Mild to moderate aortic valve sclerosis/calcification is present,  without any evidence of aortic stenosis. Aortic valve mean gradient measures 7.5 mmHg. Aortic valve peak gradient measures 15.6 mmHg. Aortic valve area, by VTI measures 1.98 cm. Pulmonic Valve: The pulmonic valve was not well visualized. Pulmonic valve regurgitation is not visualized. No evidence of pulmonic stenosis. Aorta: The aortic root is normal in size and structure. Venous: The inferior vena cava is normal in size with greater than 50% respiratory variability, suggesting right atrial pressure of 3 mmHg.  LEFT VENTRICLE PLAX 2D LVIDd:         3.45 cm LVIDs:         2.85 cm LV PW:         1.75 cm LV IVS:        1.20 cm LVOT diam:     2.10 cm LV SV:         70 LV SV Index:   40 LVOT Area:     3.46 cm  RIGHT VENTRICLE          IVC RV Basal diam:  3.47 cm  IVC diam: 1.60 cm RV Mid diam:    2.53 cm TAPSE (M-mode): 1.5 cm LEFT ATRIUM              Index       RIGHT ATRIUM           Index LA diam:        4.80 cm  2.75 cm/m  RA Area:     23.00 cm LA Vol (A2C):   105.0 ml 60.06 ml/m RA Volume:   62.70 ml  35.87 ml/m LA Vol (A4C):   75.4 ml  43.13 ml/m LA Biplane Vol: 92.8 ml  53.08 ml/m  AORTIC VALVE AV Area (Vmax):    2.11 cm AV Area (Vmean):   2.24 cm AV Area (VTI):     1.98 cm AV Vmax:           197.50 cm/s AV Vmean:          125.000 cm/s AV VTI:            0.355 m AV Peak Grad:      15.6 mmHg AV Mean Grad:      7.5 mmHg LVOT Vmax:         120.50 cm/s LVOT Vmean:        80.750 cm/s LVOT VTI:          0.203 m LVOT/AV VTI ratio: 0.57 AI PHT:            715 msec  AORTA Ao Root diam: 3.80 cm Ao Asc diam:  3.60 cm MITRAL VALVE                TRICUSPID VALVE MV Area (PHT): 3.99 cm     TR Peak grad:   22.7 mmHg MV Decel Time: 190 msec     TR Vmax:        238.00 cm/s MV E velocity: 155.00 cm/s MV A velocity: 56.20 cm/s   SHUNTS MV E/A ratio:  2.76         Systemic VTI:  0.20 m                             Systemic Diam: 2.10 cm Kirk Ruths MD Electronically signed by Kirk Ruths MD Signature Date/Time:  08/03/2020/12:27:01 PM    Final     Microbiology: No results found for this or any previous visit (from the past 240 hour(s)).   Labs: Basic Metabolic Panel: No results for input(s): NA, K, CL, CO2, GLUCOSE, BUN, CREATININE, CALCIUM, MG, PHOS in the last 168 hours. Liver Function Tests: No results for input(s): AST, ALT, ALKPHOS, BILITOT, PROT, ALBUMIN in the last 168 hours. No results for input(s): LIPASE, AMYLASE in the last 168 hours. No results for input(s): AMMONIA in the last 168 hours. CBC: No results for input(s): WBC, NEUTROABS, HGB, HCT, MCV, PLT in the last 168 hours. Cardiac Enzymes: No results for input(s): CKTOTAL, CKMB, CKMBINDEX, TROPONINI in the last 168 hours. BNP: BNP (last 3 results) No results for input(s): BNP in the last 8760 hours.  ProBNP (last 3 results) Recent Labs    10/04/19 1135  PROBNP 1,343*    CBG: No results for input(s): GLUCAP in the last 168 hours.  Principal Problem:   Comfort measures only status Active Problems:   HTN (hypertension)   Diabetes mellitus, type II ([place])   CVA (cerebral vascular accident) ([place])   [place]   DNR (do not resuscitate)   Time coordinating discharge: 38 minutes  Signed:        Tekeyah Santiago, DO [facility]  08/30/2020, 1:56 PM

## 2020-09-19 NOTE — Progress Notes (Signed)
   08-27-2020 1058  Clinical Encounter Type  Visited With Patient and family together  Visit Type Death  Referral From Nurse  Consult/Referral To Chaplain  Spiritual Encounters  Spiritual Needs Prayer;Grief support   Chaplain responded to page from Pt's nurse, Mardella Layman. Chaplain engaged in active listening and grief support with Pt's daughters, Lurena Joiner and Aurea Graff, at bedside and prayed with family. Chaplain remains available as needed.  This note was prepared by Chaplain Resident, Tacy Learn, MDiv. Chaplain remains available as needed through the on-call pager: [phone].

## 2020-09-19 NOTE — Progress Notes (Signed)
Civil engineer, contracting [facility])  Spoke with both daughters Marylu Lund and Kriste Basque this morning and discussed patients status. Per discussion we plan to keep the patient in the hospital as he transitions. Family agreeable to this. No longer needing to discuss home with hospice vs. Inpatient hospice at this time. I will continue to follow this patient and can be reached for any support.   Please feel free to call with any hospice related questions or concerns.  Thank you Yolande Jolly, BSN, [facility] [phone]

## 2020-09-19 DEATH — deceased

## 2020-10-11 ENCOUNTER — Ambulatory Visit: Payer: MEDICARE | Admitting: Cardiovascular Disease

## 2021-03-09 IMAGING — CR DG CHEST 2V
2 series · 2 of 2 positions shown · non-contrast
Comparison: December 25, 2011.

CLINICAL DATA: Preop for thoracic surgery.

EXAM:
CHEST - 2 VIEW

[w chest pa]
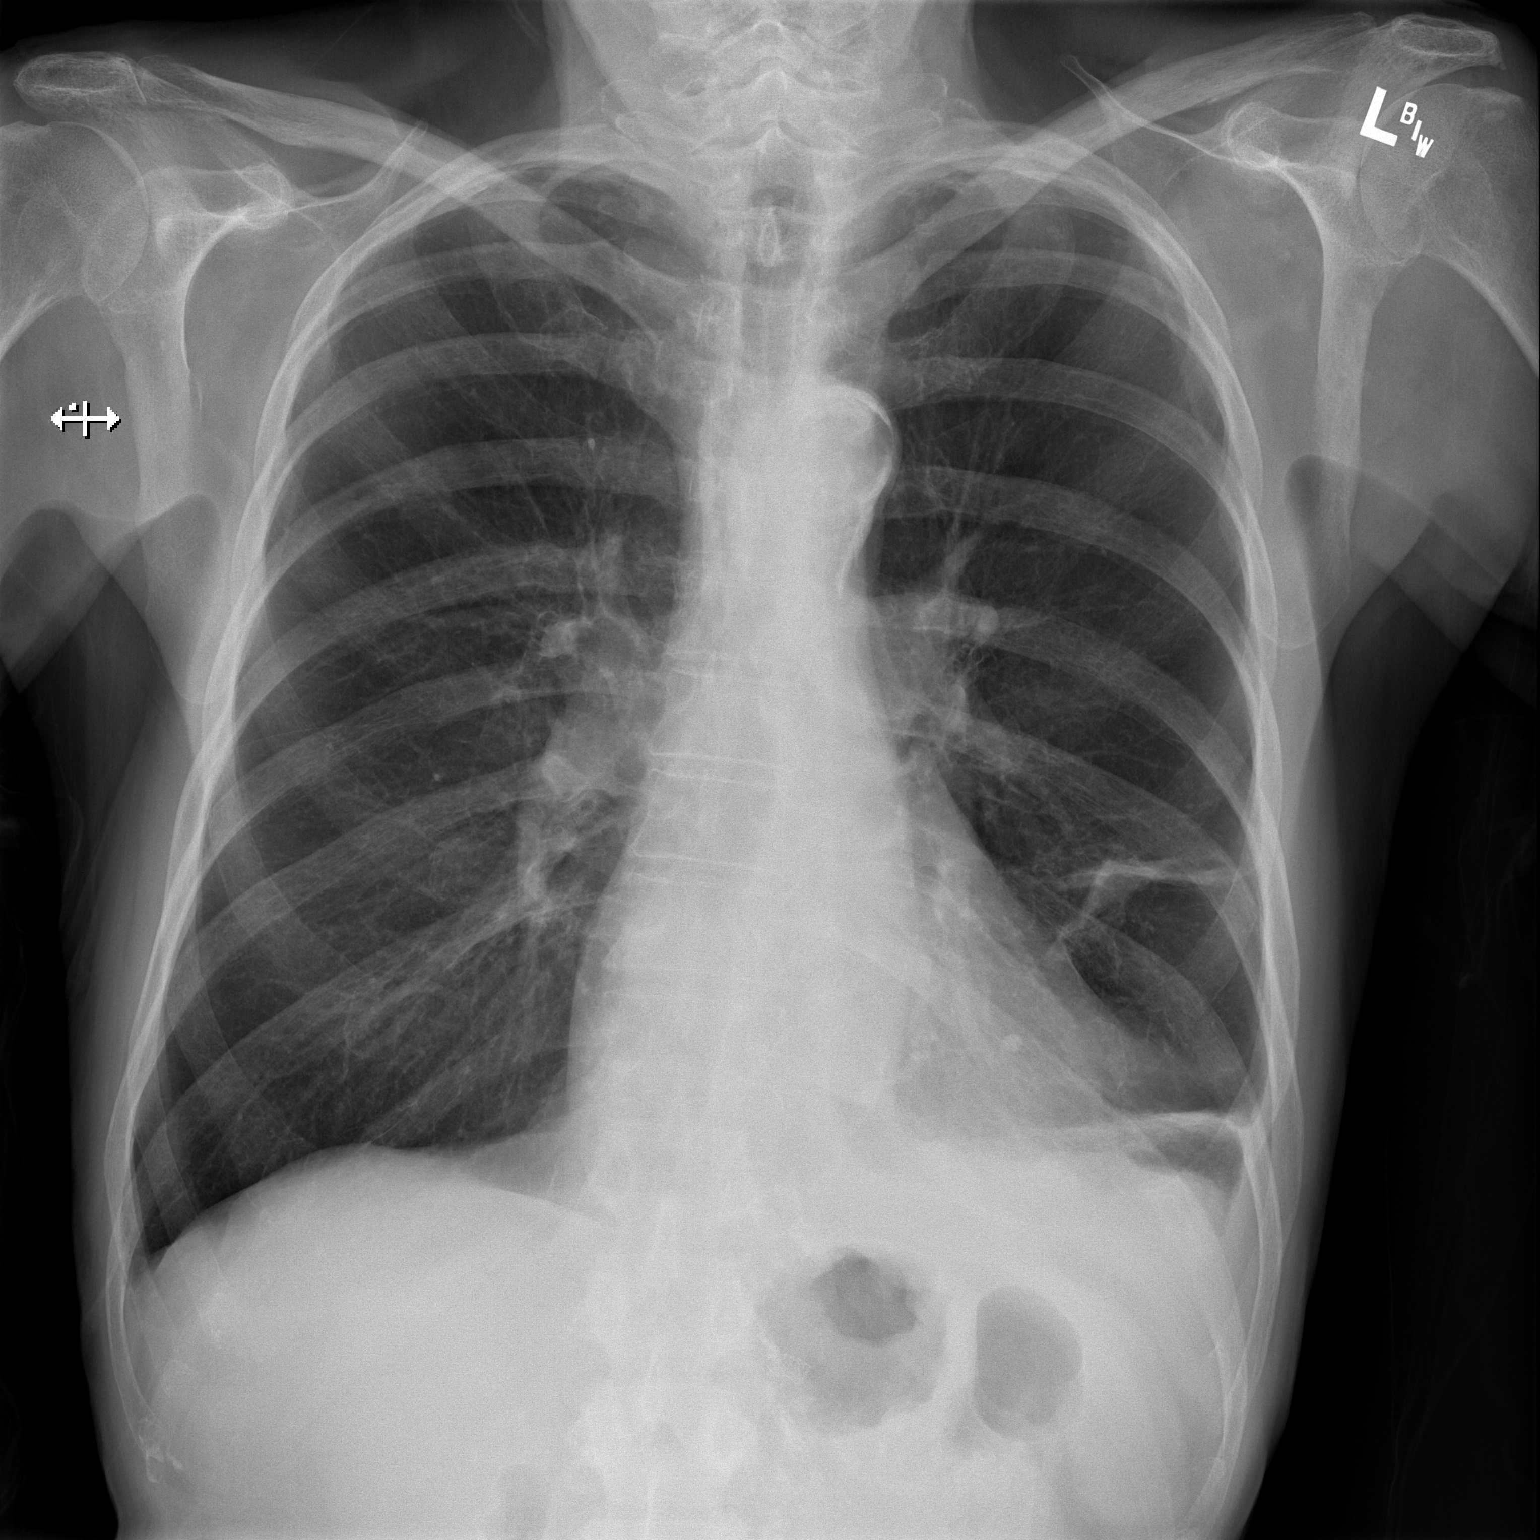

[w chest lat]
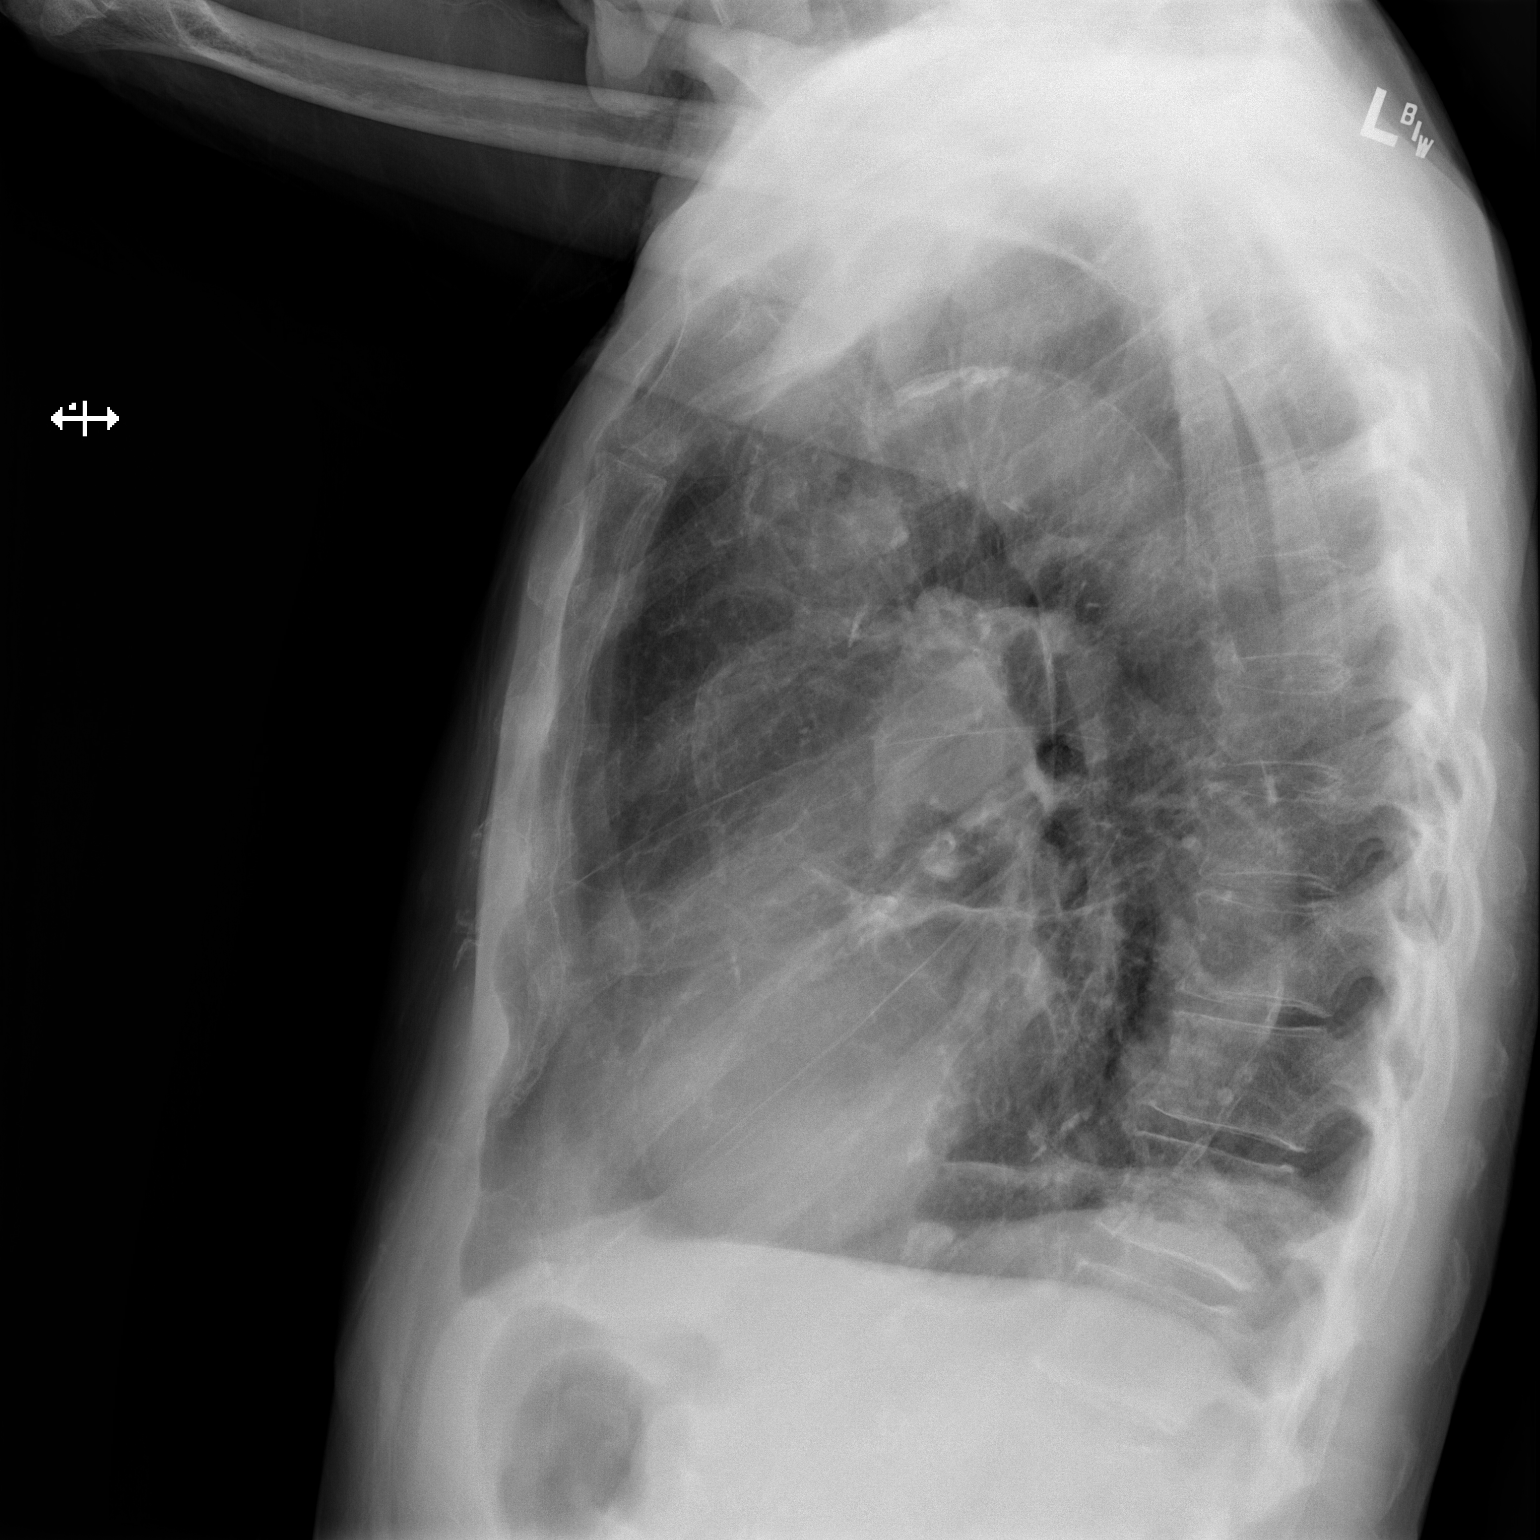

[2 of 2 positions shown; findings below may reference images not displayed]

FINDINGS: The heart size and mediastinal contours are within normal limits.
Atherosclerosis of thoracic aorta is noted. No pneumothorax is
noted. Right lung is clear. Linear opacity is seen in lingular
segment of left upper lobe concerning for scarring or atelectasis.
Blunting of left costophrenic sulcus is noted most consistent with
scarring. The visualized skeletal structures are unremarkable.
IMPRESSION: Aortic atherosclerosis. Linear opacity seen in lingular segment of
left upper lobe concerning for scarring or atelectasis.

Aortic Atherosclerosis ([id number].H).

## 2021-03-11 IMAGING — CR DG CHEST 1V PORT
1 series · 1 of 1 positions shown · non-contrast
Comparison: December 07, 2019.

CLINICAL DATA: Status post thoracic surgery.

EXAM:
PORTABLE CHEST 1 VIEW

[AP]
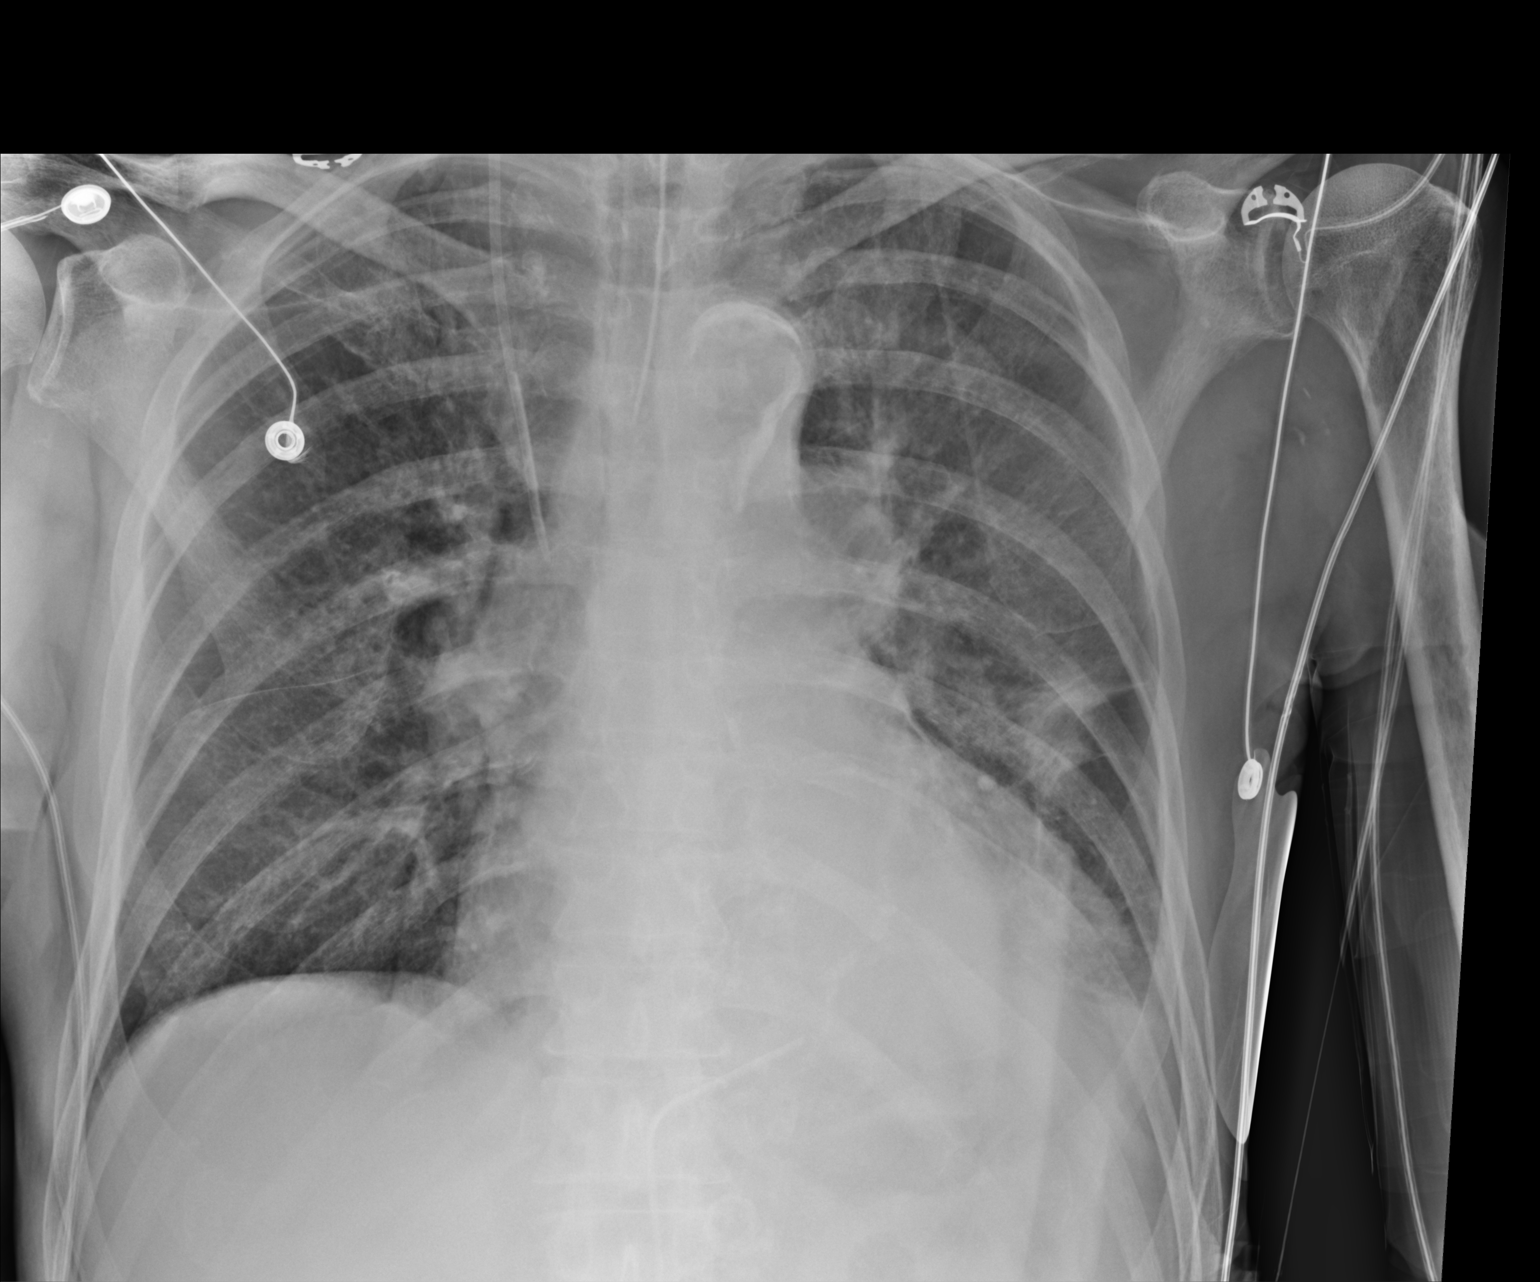

[1 of 1 positions shown; findings below may reference images not displayed]

FINDINGS: Stable cardiomediastinal silhouette. Atherosclerosis of thoracic
aorta is noted. Endotracheal tube is in good position. Right
internal jugular catheter is noted with tip in expected position of
the SVC. No pneumothorax is noted. Right lung is clear. Mild left
basilar atelectasis is noted with probable left lingular
subsegmental atelectasis. Bony thorax is unremarkable.
IMPRESSION: Endotracheal tube in good position. Mild left basilar atelectasis is
noted with probable left lingular subsegmental atelectasis.

Aortic Atherosclerosis (EUFOA-VG0.0).

## 2021-03-12 IMAGING — DX DG CHEST 1V PORT
1 series · 1 of 1 positions shown · non-contrast
Comparison: Portable chest 12/09/2019 and earlier.

CLINICAL DATA: 89-year-old male with pericardial effusion.

EXAM:
PORTABLE CHEST 1 VIEW

[chest]
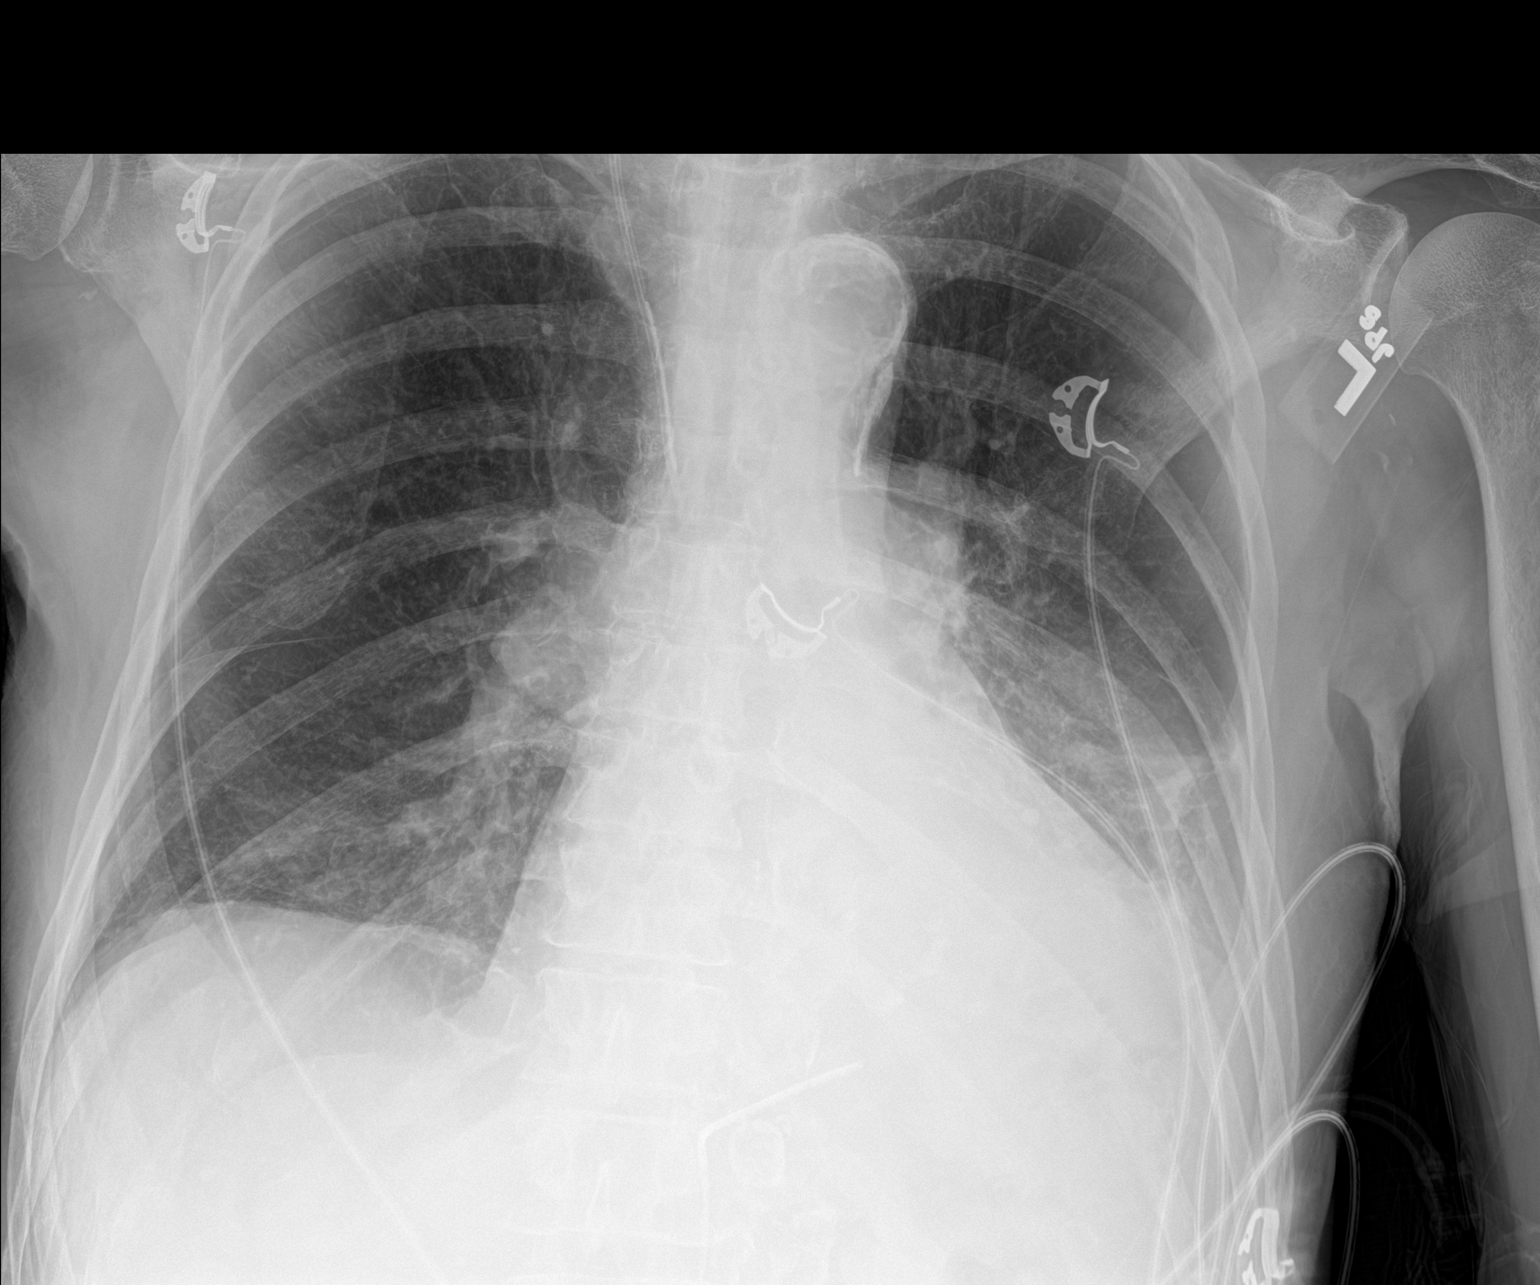

[1 of 1 positions shown; findings below may reference images not displayed]

FINDINGS: Portable AP semi upright view at 1212 hours. Extubated. Stable right
IJ central line and pericardial or mediastinal tube projecting at
the level of the diaphragm. Improved lung volumes, resolved
pulmonary vascular congestion and improved ventilation. There
remains dense left lung base opacity. Stable cardiac size and
mediastinal contours. Calcified aortic atherosclerosis. No areas of
worsening ventilation.
IMPRESSION: 1. Extubated with improved lung volumes and resolved vascular
congestion.
2. Continued dense left lung base opacity. Stable mediastinal
contours.

## 2021-03-14 IMAGING — DX DG CHEST 1V PORT
1 series · 1 of 1 positions shown · non-contrast
Comparison: December 11, 2019

CLINICAL DATA: Subxiphoid pericardial window.

EXAM:
PORTABLE CHEST 1 VIEW

[chest ap]
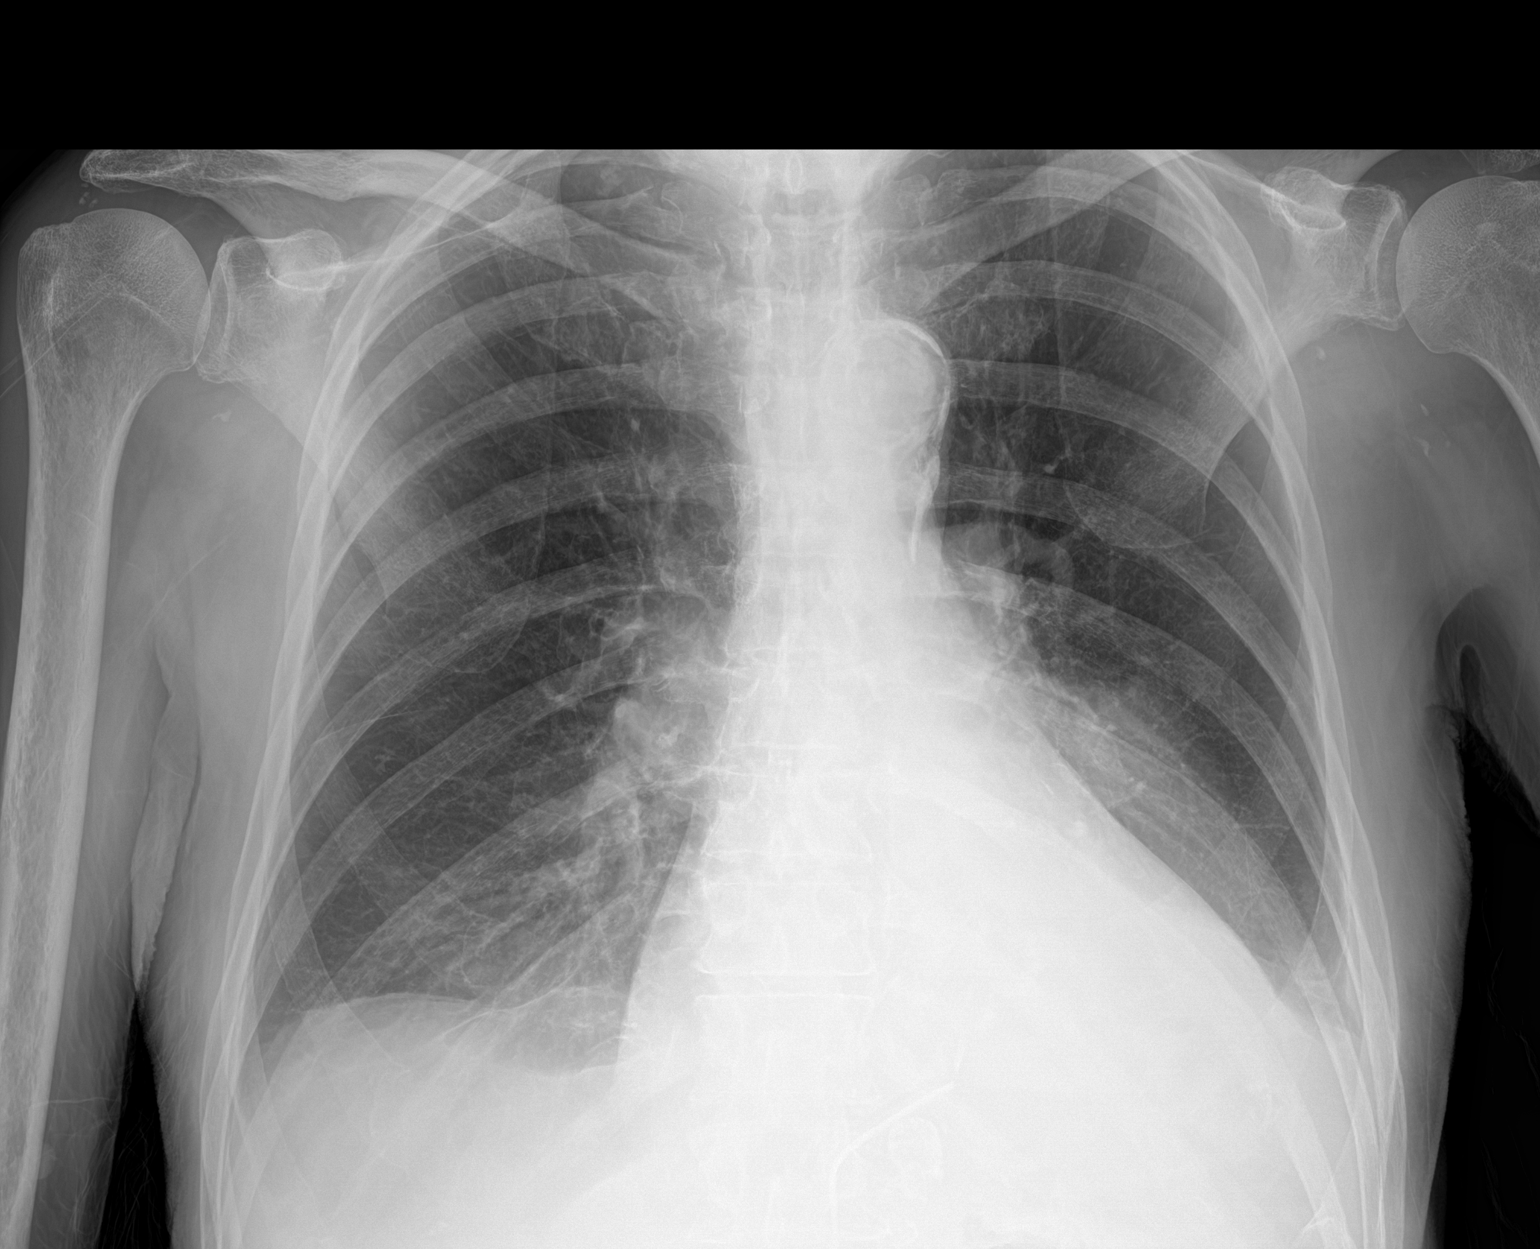

[1 of 1 positions shown; findings below may reference images not displayed]

FINDINGS: Small right effusion with atelectasis. Small left effusion with
atelectasis. Stable left retrocardiac opacity. The cardiac
silhouette remains enlarged. The hila and mediastinum are normal. No
pneumothorax. No nodules or masses. No other abnormalities.
IMPRESSION: 1. Enlargement of the cardiac silhouette is stable.
2. Small pleural effusions with underlying opacities, likely
atelectasis.
3. Some sort of drain overlies the lower medial left chest,
projected over the cardiac silhouette.
